# Patient Record
Sex: Female | Born: 1945 | Race: Black or African American | Hispanic: No | Smoking: Never smoker
Health system: Southern US, Community
[De-identification: ages and names within clinical notes are randomized; demographics above are authoritative.]

## PROBLEM LIST (undated history)

## (undated) DIAGNOSIS — I10 Essential (primary) hypertension: Secondary | ICD-10-CM

## (undated) DIAGNOSIS — M419 Scoliosis, unspecified: Secondary | ICD-10-CM

## (undated) DIAGNOSIS — K449 Diaphragmatic hernia without obstruction or gangrene: Secondary | ICD-10-CM

## (undated) DIAGNOSIS — IMO0001 Reserved for inherently not codable concepts without codable children: Secondary | ICD-10-CM

## (undated) DIAGNOSIS — F32A Depression, unspecified: Secondary | ICD-10-CM

## (undated) DIAGNOSIS — M1990 Unspecified osteoarthritis, unspecified site: Secondary | ICD-10-CM

## (undated) DIAGNOSIS — D649 Anemia, unspecified: Secondary | ICD-10-CM

## (undated) DIAGNOSIS — F329 Major depressive disorder, single episode, unspecified: Secondary | ICD-10-CM

## (undated) DIAGNOSIS — E7800 Pure hypercholesterolemia, unspecified: Secondary | ICD-10-CM

## (undated) DIAGNOSIS — E119 Type 2 diabetes mellitus without complications: Secondary | ICD-10-CM

## (undated) DIAGNOSIS — J449 Chronic obstructive pulmonary disease, unspecified: Secondary | ICD-10-CM

## (undated) DIAGNOSIS — Z91041 Radiographic dye allergy status: Secondary | ICD-10-CM

## (undated) DIAGNOSIS — K219 Gastro-esophageal reflux disease without esophagitis: Secondary | ICD-10-CM

## (undated) DIAGNOSIS — Z8719 Personal history of other diseases of the digestive system: Secondary | ICD-10-CM

## (undated) HISTORY — DX: Diaphragmatic hernia without obstruction or gangrene: K44.9

## (undated) HISTORY — PX: HERNIA REPAIR: SHX51

## (undated) HISTORY — PX: ABDOMINAL HYSTERECTOMY: SHX81

## (undated) HISTORY — PX: CHOLECYSTECTOMY: SHX55

## (undated) HISTORY — DX: Radiographic dye allergy status: Z91.041

## (undated) HISTORY — DX: Scoliosis, unspecified: M41.9

## (undated) HISTORY — DX: Chronic obstructive pulmonary disease, unspecified: J44.9

## (undated) HISTORY — DX: Morbid (severe) obesity due to excess calories: E66.01

## (undated) HISTORY — DX: Anemia, unspecified: D64.9

## (undated) HISTORY — PX: GASTRIC BYPASS: SHX52

## (undated) HISTORY — PX: CATARACT EXTRACTION W/ INTRAOCULAR LENS IMPLANT: SHX1309

---

## 2004-11-27 ENCOUNTER — Ambulatory Visit: Payer: Self-pay | Admitting: Pain Medicine

## 2004-11-30 ENCOUNTER — Ambulatory Visit: Payer: Self-pay | Admitting: Pain Medicine

## 2004-12-12 ENCOUNTER — Ambulatory Visit: Payer: Self-pay | Admitting: Pain Medicine

## 2004-12-14 ENCOUNTER — Ambulatory Visit: Payer: Self-pay | Admitting: Physician Assistant

## 2004-12-28 ENCOUNTER — Ambulatory Visit: Payer: Self-pay | Admitting: Family Medicine

## 2005-01-04 ENCOUNTER — Ambulatory Visit: Payer: Self-pay | Admitting: Pain Medicine

## 2005-01-18 ENCOUNTER — Ambulatory Visit: Payer: Self-pay | Admitting: Pain Medicine

## 2005-02-01 ENCOUNTER — Ambulatory Visit: Payer: Self-pay | Admitting: Physician Assistant

## 2005-02-26 ENCOUNTER — Ambulatory Visit: Payer: Self-pay | Admitting: Physician Assistant

## 2005-03-12 ENCOUNTER — Ambulatory Visit: Payer: Self-pay | Admitting: Pain Medicine

## 2005-03-13 ENCOUNTER — Ambulatory Visit: Payer: Self-pay | Admitting: Pain Medicine

## 2005-03-26 ENCOUNTER — Ambulatory Visit: Payer: Self-pay | Admitting: Physician Assistant

## 2005-04-05 ENCOUNTER — Ambulatory Visit: Payer: Self-pay | Admitting: Pain Medicine

## 2005-05-16 ENCOUNTER — Ambulatory Visit: Payer: Self-pay | Admitting: Physician Assistant

## 2005-07-13 ENCOUNTER — Ambulatory Visit: Payer: Self-pay | Admitting: Physician Assistant

## 2005-07-19 ENCOUNTER — Emergency Department: Payer: Self-pay | Admitting: Emergency Medicine

## 2005-08-21 ENCOUNTER — Ambulatory Visit: Payer: Self-pay | Admitting: Internal Medicine

## 2005-09-20 ENCOUNTER — Ambulatory Visit: Payer: Self-pay | Admitting: Pain Medicine

## 2005-10-15 ENCOUNTER — Ambulatory Visit: Payer: Self-pay | Admitting: Physician Assistant

## 2006-02-04 ENCOUNTER — Ambulatory Visit: Payer: Self-pay | Admitting: Physician Assistant

## 2006-02-15 ENCOUNTER — Ambulatory Visit: Payer: Self-pay | Admitting: Pain Medicine

## 2006-03-19 ENCOUNTER — Ambulatory Visit: Payer: Self-pay | Admitting: Pain Medicine

## 2006-03-29 ENCOUNTER — Ambulatory Visit: Payer: Self-pay | Admitting: Physician Assistant

## 2006-04-09 ENCOUNTER — Ambulatory Visit: Payer: Self-pay | Admitting: Pain Medicine

## 2006-05-08 ENCOUNTER — Ambulatory Visit: Payer: Self-pay | Admitting: Physician Assistant

## 2006-06-03 ENCOUNTER — Ambulatory Visit: Payer: Self-pay | Admitting: Physician Assistant

## 2006-06-11 ENCOUNTER — Ambulatory Visit: Payer: Self-pay | Admitting: Pain Medicine

## 2006-07-02 ENCOUNTER — Ambulatory Visit: Payer: Self-pay | Admitting: Physician Assistant

## 2006-08-29 ENCOUNTER — Ambulatory Visit: Payer: Self-pay | Admitting: Physician Assistant

## 2006-09-05 ENCOUNTER — Ambulatory Visit: Payer: Self-pay | Admitting: Pain Medicine

## 2006-09-20 ENCOUNTER — Ambulatory Visit: Payer: Self-pay | Admitting: Physician Assistant

## 2006-12-30 ENCOUNTER — Ambulatory Visit: Payer: Self-pay | Admitting: Pain Medicine

## 2007-01-20 ENCOUNTER — Ambulatory Visit: Payer: Self-pay | Admitting: Physician Assistant

## 2007-01-23 ENCOUNTER — Ambulatory Visit: Payer: Self-pay | Admitting: Pain Medicine

## 2007-02-04 ENCOUNTER — Ambulatory Visit: Payer: Self-pay | Admitting: Pain Medicine

## 2007-02-10 ENCOUNTER — Ambulatory Visit: Payer: Self-pay | Admitting: Physician Assistant

## 2007-02-18 ENCOUNTER — Ambulatory Visit: Payer: Self-pay | Admitting: Pain Medicine

## 2007-03-28 ENCOUNTER — Ambulatory Visit: Payer: Self-pay | Admitting: Physician Assistant

## 2007-04-03 ENCOUNTER — Ambulatory Visit: Payer: Self-pay | Admitting: Pain Medicine

## 2007-04-10 ENCOUNTER — Ambulatory Visit: Payer: Self-pay | Admitting: Neurological Surgery

## 2007-04-17 ENCOUNTER — Ambulatory Visit: Payer: Self-pay | Admitting: Pain Medicine

## 2007-05-14 ENCOUNTER — Ambulatory Visit: Payer: Self-pay | Admitting: Pain Medicine

## 2007-07-16 ENCOUNTER — Ambulatory Visit: Payer: Self-pay | Admitting: Physician Assistant

## 2007-07-29 ENCOUNTER — Ambulatory Visit: Payer: Self-pay | Admitting: Pain Medicine

## 2007-09-18 ENCOUNTER — Ambulatory Visit: Payer: Self-pay | Admitting: Physician Assistant

## 2007-10-02 ENCOUNTER — Ambulatory Visit: Payer: Self-pay | Admitting: Pain Medicine

## 2007-10-23 ENCOUNTER — Ambulatory Visit: Payer: Self-pay

## 2007-11-11 ENCOUNTER — Ambulatory Visit: Payer: Self-pay | Admitting: General Surgery

## 2007-12-15 ENCOUNTER — Ambulatory Visit: Payer: Self-pay | Admitting: Physician Assistant

## 2007-12-23 ENCOUNTER — Ambulatory Visit: Payer: Self-pay | Admitting: Pain Medicine

## 2007-12-29 ENCOUNTER — Ambulatory Visit: Payer: Self-pay | Admitting: Pain Medicine

## 2008-01-06 ENCOUNTER — Ambulatory Visit: Payer: Self-pay | Admitting: Physician Assistant

## 2008-04-06 ENCOUNTER — Ambulatory Visit: Payer: Self-pay | Admitting: Physician Assistant

## 2008-05-10 ENCOUNTER — Ambulatory Visit: Payer: Self-pay | Admitting: Pain Medicine

## 2008-06-03 ENCOUNTER — Ambulatory Visit: Payer: Self-pay | Admitting: Physician Assistant

## 2008-06-08 ENCOUNTER — Ambulatory Visit: Payer: Self-pay | Admitting: Pain Medicine

## 2008-07-01 ENCOUNTER — Ambulatory Visit: Payer: Self-pay | Admitting: Physician Assistant

## 2008-08-19 ENCOUNTER — Ambulatory Visit: Payer: Self-pay | Admitting: Physician Assistant

## 2008-11-09 ENCOUNTER — Ambulatory Visit: Payer: Self-pay | Admitting: Physician Assistant

## 2009-01-03 ENCOUNTER — Encounter: Admission: RE | Admit: 2009-01-03 | Discharge: 2009-04-03 | Payer: Self-pay | Admitting: Family Medicine

## 2009-02-02 ENCOUNTER — Ambulatory Visit: Payer: Self-pay | Admitting: Physician Assistant

## 2009-05-13 ENCOUNTER — Ambulatory Visit: Payer: Self-pay | Admitting: Pain Medicine

## 2009-08-10 ENCOUNTER — Ambulatory Visit: Payer: Self-pay | Admitting: Physician Assistant

## 2009-11-23 ENCOUNTER — Ambulatory Visit: Payer: Self-pay | Admitting: Physician Assistant

## 2009-11-24 ENCOUNTER — Inpatient Hospital Stay (HOSPITAL_COMMUNITY): Admission: EM | Admit: 2009-11-24 | Discharge: 2009-11-25 | Payer: Self-pay | Admitting: Emergency Medicine

## 2010-02-20 ENCOUNTER — Ambulatory Visit: Payer: Self-pay | Admitting: Pain Medicine

## 2010-03-27 ENCOUNTER — Ambulatory Visit: Payer: Self-pay | Admitting: Family Medicine

## 2010-12-10 ENCOUNTER — Encounter: Payer: Self-pay | Admitting: Neurological Surgery

## 2011-02-04 LAB — CARDIAC PANEL(CRET KIN+CKTOT+MB+TROPI): CK, MB: 12.8 ng/mL (ref 0.3–4.0)

## 2011-02-04 LAB — COMPREHENSIVE METABOLIC PANEL
Alkaline Phosphatase: 72 U/L (ref 39–117)
BUN: 18 mg/dL (ref 6–23)
CO2: 25 mEq/L (ref 19–32)
Chloride: 105 mEq/L (ref 96–112)
Creatinine, Ser: 0.68 mg/dL (ref 0.4–1.2)
GFR calc non Af Amer: >60 mL/min (ref >60)
Glucose, Bld: 147 mg/dL — ABNORMAL HIGH (ref 70–99)
Total Bilirubin: 1.1 mg/dL (ref 0.3–1.2)

## 2011-02-04 LAB — APTT: aPTT: 20 seconds — ABNORMAL LOW (ref 24–37)

## 2011-02-04 LAB — URINE MICROSCOPIC-ADD ON

## 2011-02-04 LAB — URINALYSIS, ROUTINE W REFLEX MICROSCOPIC
Hgb urine dipstick: NEGATIVE
Nitrite: NEGATIVE
Specific Gravity, Urine: 1.021 (ref 1.005–1.030)
Urobilinogen, UA: 0.2 mg/dL (ref 0.0–1.0)
pH: 5 (ref 5.0–8.0)

## 2011-02-04 LAB — CBC
HCT: 39 % (ref 36.0–46.0)
Hemoglobin: 12.9 g/dL (ref 12.0–15.0)
MCV: 91.8 fL (ref 78.0–100.0)
Platelets: 258 10*3/uL (ref 150–400)
RBC: 4.25 MIL/uL (ref 3.87–5.11)
WBC: 11.2 10*3/uL — ABNORMAL HIGH (ref 4.0–10.5)

## 2011-02-04 LAB — POCT I-STAT, CHEM 8
BUN: 26 mg/dL — ABNORMAL HIGH (ref 6–23)
Calcium, Ion: 1.09 mmol/L — ABNORMAL LOW (ref 1.12–1.32)
Chloride: 107 mEq/L (ref 96–112)
Glucose, Bld: 145 mg/dL — ABNORMAL HIGH (ref 70–99)
Potassium: 4.7 mEq/L (ref 3.5–5.1)

## 2011-02-04 LAB — PROTIME-INR
INR: 0.94 (ref 0.00–1.49)
Prothrombin Time: 12.5 seconds (ref 11.6–15.2)

## 2011-02-04 LAB — LACTIC ACID, PLASMA: Lactic Acid, Venous: 4 mmol/L — ABNORMAL HIGH (ref 0.5–2.2)

## 2012-12-16 ENCOUNTER — Inpatient Hospital Stay: Payer: Self-pay | Admitting: Student

## 2012-12-16 LAB — COMPREHENSIVE METABOLIC PANEL
Albumin: 2.9 g/dL — ABNORMAL LOW (ref 3.4–5.0)
Calcium, Total: 9.3 mg/dL (ref 8.5–10.1)
EGFR (African American): >60
EGFR (Non-African Amer.): 58 — ABNORMAL LOW
Potassium: 2.2 mmol/L — CL (ref 3.5–5.1)
SGOT(AST): 12 U/L — ABNORMAL LOW (ref 15–37)
Sodium: 137 mmol/L (ref 136–145)
Total Protein: 8.6 g/dL — ABNORMAL HIGH (ref 6.4–8.2)

## 2012-12-16 LAB — CBC
HCT: 35 % (ref 35.0–47.0)
HGB: 11 g/dL — ABNORMAL LOW (ref 12.0–16.0)
MCH: 27 pg (ref 26.0–34.0)
MCV: 86 fL (ref 80–100)
RBC: 4.06 10*6/uL (ref 3.80–5.20)

## 2012-12-17 LAB — POTASSIUM: Potassium: 4 mmol/L (ref 3.5–5.1)

## 2012-12-17 LAB — BASIC METABOLIC PANEL
Anion Gap: 12 (ref 7–16)
Calcium, Total: 8.6 mg/dL (ref 8.5–10.1)
Chloride: 101 mmol/L (ref 98–107)
EGFR (African American): >60
EGFR (Non-African Amer.): >60
Glucose: 152 mg/dL — ABNORMAL HIGH (ref 65–99)
Osmolality: 283 (ref 275–301)
Potassium: 2.4 mmol/L — CL (ref 3.5–5.1)

## 2012-12-17 LAB — CBC WITH DIFFERENTIAL/PLATELET
Basophil #: 0 10*3/uL (ref 0.0–0.1)
Basophil %: 0.2 %
HCT: 32.2 % — ABNORMAL LOW (ref 35.0–47.0)
HGB: 10.5 g/dL — ABNORMAL LOW (ref 12.0–16.0)
Lymphocyte #: 2.1 10*3/uL (ref 1.0–3.6)
MCH: 28.3 pg (ref 26.0–34.0)
MCHC: 32.8 g/dL (ref 32.0–36.0)
MCV: 86 fL (ref 80–100)
Monocyte %: 10.2 %
Neutrophil #: 10.7 10*3/uL — ABNORMAL HIGH (ref 1.4–6.5)
Neutrophil %: 74.7 %
Platelet: 326 10*3/uL (ref 150–440)
RBC: 3.73 10*6/uL — ABNORMAL LOW (ref 3.80–5.20)
RDW: 14.4 % (ref 11.5–14.5)
WBC: 14.4 10*3/uL — ABNORMAL HIGH (ref 3.6–11.0)

## 2012-12-18 LAB — CLOSTRIDIUM DIFFICILE BY PCR

## 2012-12-19 LAB — CBC WITH DIFFERENTIAL/PLATELET
Basophil #: 0 10*3/uL (ref 0.0–0.1)
Eosinophil #: 0.2 10*3/uL (ref 0.0–0.7)
HCT: 32.3 % — ABNORMAL LOW (ref 35.0–47.0)
HGB: 10.1 g/dL — ABNORMAL LOW (ref 12.0–16.0)
Lymphocyte %: 21.7 %
MCHC: 31.4 g/dL — ABNORMAL LOW (ref 32.0–36.0)
MCV: 87 fL (ref 80–100)
Monocyte #: 1.1 x10 3/mm — ABNORMAL HIGH (ref 0.2–0.9)
Monocyte %: 7.8 %
Neutrophil #: 9.3 10*3/uL — ABNORMAL HIGH (ref 1.4–6.5)
Neutrophil %: 68.4 %
RDW: 14.5 % (ref 11.5–14.5)
WBC: 13.6 10*3/uL — ABNORMAL HIGH (ref 3.6–11.0)

## 2012-12-22 ENCOUNTER — Ambulatory Visit: Payer: Self-pay | Admitting: Pain Medicine

## 2013-01-06 ENCOUNTER — Ambulatory Visit: Payer: Self-pay | Admitting: Pain Medicine

## 2013-01-06 LAB — COMPREHENSIVE METABOLIC PANEL
Albumin: 3.8 g/dL (ref 3.4–5.0)
BUN: 33 mg/dL — ABNORMAL HIGH (ref 7–18)
Bilirubin,Total: 0.4 mg/dL (ref 0.2–1.0)
Co2: 28 mmol/L (ref 21–32)
EGFR (African American): >60
Potassium: 3.6 mmol/L (ref 3.5–5.1)
SGOT(AST): 34 U/L (ref 15–37)
SGPT (ALT): 38 U/L (ref 12–78)
Sodium: 138 mmol/L (ref 136–145)
Total Protein: 8.9 g/dL — ABNORMAL HIGH (ref 6.4–8.2)

## 2013-01-06 LAB — SEDIMENTATION RATE: Erythrocyte Sed Rate: 37 mm/hr — ABNORMAL HIGH (ref 0–30)

## 2013-02-12 ENCOUNTER — Ambulatory Visit: Payer: Self-pay | Admitting: Pain Medicine

## 2013-03-02 ENCOUNTER — Ambulatory Visit: Payer: Self-pay | Admitting: Pain Medicine

## 2013-04-17 ENCOUNTER — Ambulatory Visit: Payer: Self-pay | Admitting: Pain Medicine

## 2013-04-23 ENCOUNTER — Ambulatory Visit: Payer: Self-pay | Admitting: Pain Medicine

## 2013-05-13 ENCOUNTER — Ambulatory Visit: Payer: Self-pay | Admitting: Pain Medicine

## 2013-07-31 ENCOUNTER — Ambulatory Visit: Payer: Self-pay | Admitting: Pain Medicine

## 2013-08-13 ENCOUNTER — Ambulatory Visit: Payer: Self-pay | Admitting: Pain Medicine

## 2013-09-02 ENCOUNTER — Ambulatory Visit: Payer: Self-pay | Admitting: Pain Medicine

## 2013-09-08 ENCOUNTER — Ambulatory Visit: Payer: Self-pay | Admitting: Ophthalmology

## 2013-12-11 ENCOUNTER — Ambulatory Visit: Payer: Self-pay | Admitting: Pain Medicine

## 2014-04-30 ENCOUNTER — Ambulatory Visit: Payer: Self-pay | Admitting: Pain Medicine

## 2014-08-20 ENCOUNTER — Ambulatory Visit: Payer: Self-pay | Admitting: Pain Medicine

## 2015-03-11 NOTE — Consult Note (Signed)
PATIENT NAME:  Mariah Poole, Mariah Poole MR#:  [id number] DATE OF BIRTH:  1946/01/07  DATE OF CONSULTATION:  12/18/2012  CONSULTING PHYSICIAN:  Scot Jun, MD  HISTORY OF PRESENT ILLNESS:   The patient is a 69 year old black female. She had the onset of nausea, vomiting, headache, myalgias and significant diarrhea to the point of dehydration and hypokalemia. He was admitted to the hospital. I was asked to see her for diarrhea.    The patient feels that she is definitely getting better. At the beginning of her illness, she was unable to keep down fluids, was having copious diarrhea multiple times a night.  She denies any hematemesis. No hematochezia. She was started on Tamiflu after being seen at [facility]. The diarrhea was 5 to 10 loose stools a day, several nocturnal movements. Because of the vomiting, she was not able to drink much, so she was admitted to the hospital. I was asked to see her in consultation.   PAST MEDICAL HISTORY:  Includes diabetes, hypertension, hyperlipidemia and GERD.   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 She lost 100 pounds with the surgery.   CURRENT MEDICATIONS: Tylenol with hydrocodone, albuterol inhaler, atenolol 25 mg a day, Belviq 10 mg b.i.d., Colace 100 mg b.i.d., Lasix 20 mg a day, Glumetza 1000 mg extended release daily, which is metformin; hydrochlorothiazide 25 mg a day, potassium 20 mEq a day, Lomotil as needed, Pravachol 20 mg at bedtime, promethazine 12.5 to 25 mg every 6 hours p.r.n., Protonix 40 mg a day and Tamiflu 75 mg b.i.d.   PHYSICAL EXAMINATION: GENERAL:  Black female in no acute distress.  HEENT:  Sclerae anicteric. Conjunctivae negative. Tongue negative. Head is atraumatic.  CHEST:  Clear.  HEART:  No murmurs or gallops I can hear.  ABDOMEN:  No hepatosplenomegaly. No masses. No bruits. No significant tenderness.  SKIN:  Warm and dry.  PSYCHIATRIC:  Mood and affect are appropriate.   LABORATORY AND  RADIOLOGIC DATA: Glucose 130, BUN 27, creatinine 1, sodium 137, potassium 2.2, chloride 98, bicarb 30, albumin 2.9. Liver functions normal. White count 13.8, hemoglobin 11. The patient had a chest x-ray that showed possible hypoinflation versus atelectasis, fibrosis or minimal infiltrations.   ASSESSMENT AND PLAN: The patient likely has a flulike syndrome with vomiting and diarrhea. She has hypokalemia that has been corrected. Her problems may be exacerbated by the fact that she is diabetic. No recommendations for anything else at this time except fluids and symptomatic treatment. I will follow with you.    ____________________________ Scot Jun, MD rte:dm D: 12/18/2012 09:35:54 ET T: 12/18/2012 09:55:02 ET JOB#: [id number]  cc: Scot Jun, MD, <Dictator> Srikar R. Elpidio Anis, MD Scot Jun MD ELECTRONICALLY SIGNED 01/08/2013 7:55

## 2015-03-11 NOTE — H&P (Signed)
PATIENT NAME:  Mariah Poole, Mariah Poole MR#:  [id number] DATE OF BIRTH:  1946-08-08  DATE OF ADMISSION:  12/16/2012  PRIMARY CARE PHYSICIAN:  [facility].   CHIEF COMPLAINT:  Nausea, vomiting, diarrhea.   HISTORY OF PRESENT ILLNESS:  This is a 69 year old female who presents to the hospital due to persistent nausea, vomiting and diarrhea ongoing for the past few days. The patient says that she started feeling ill about Tuesday of last week when she started developing some chills and some respiratory symptoms. She went to see her primary care physician at [facility] and was diagnosed with the flu and started on Tamiflu shortly thereafter. The patient says she has been having diarrhea which is at least 5 to 10 loose bowel movements for the past week or so. It is not associated with any food. She has had no sick contacts. Her diarrhea is nonbloody in nature. She also has had poor p.o. intake and had some chills. No documented fever. The patient says she has not been able to eat or drink anything for the past 2 days as she is scared about vomiting and having diarrhea. She went to see her primary care physician at the [facility] today who then referred her to the ER for further evaluation.   REVIEW OF SYSTEMS: CONSTITUTIONAL: No documented fever. No weight gain or weight loss.  EYES: No blurred or double vision.  ENT: No tinnitus. No postnasal drip. No redness of the oropharynx.  RESPIRATORY: Positive cough. No wheeze, no hemoptysis. Positive dyspnea.  CARDIOVASCULAR: No chest pain, no orthopnea, no palpitations, no syncope.  GASTROINTESTINAL: Positive nausea. Positive diarrhea. Positive vomiting. No abdominal pain, no melena, no hematochezia.  GENITOURINARY: No dysuria or hematuria.  ENDOCRINE: No polyuria or nocturia. No heat or cold intolerance.  HEMATOLOGIC: No acute anemia, bruising, bleeding.  INTEGUMENTARY: No rashes. No lesions.  MUSCULOSKELETAL: No arthritis, no swelling, no gout.  NEUROLOGIC: No  numbness or tingling. No ataxia, no seizure-type activity.  PSYCHIATRIC: No anxiety, no insomnia, no ADD.   PAST MEDICAL HISTORY:  Consistent with diabetes, hypertension, hyperlipidemia, GERD.   ALLERGIES:  IV DYE.   SOCIAL HISTORY:  No smoking. No alcohol abuse. No illicit drug abuse. Lives at home with her daughter.   FAMILY HISTORY:  Mother died from old age from Alzheimer's dementia. Father died from complications of heart disease. He was also a diabetic. Diabetes seems to run in her family.   CURRENT MEDICATIONS:  Tylenol with hydrocodone 10/325 mg 1 tab t.i.d. as needed, albuterol inhaler 2 puffs q.i.d. as needed, atenolol 25 mg daily, Belviq 10 mg b.i.d., Colace 10 mg b.i.d., Lasix 20 mg daily, Glumetza 1000 mg extended release daily which is metformin, hydrochlorothiazide 25 mg daily, potassium 20 mEq daily, Lomotil as needed, Pravachol 20 mg at bedtime, promethazine 25 mg 1/2 tab to 1 tab q. 6 hours as needed, Protonix 40 mg daily and Tamiflu 75 mg b.i.d. (she only has 2 pills left).   PHYSICAL EXAMINATION: VITAL SIGNS: Temperature 99.8, pulse 98, respirations 20, blood pressure 133/68, sats 95% on room air.  GENERAL: She is a pleasant appearing female in no apparent distress.  HEENT: Atraumatic, normocephalic. Extraocular muscles are intact. Pupils equal and reactive to light. Sclerae anicteric. No conjunctival injection. No pharyngeal erythema.  NECK: Supple. There is no jugular venous distention, no bruits, no lymphadenopathy, no thyromegaly.  HEART: Tachycardic, regular. No murmurs, rubs or clicks.  LUNGS: Clear to auscultation bilaterally. No rales, no rhonchi, no wheezes.  ABDOMEN: Soft, flat,  nontender, nondistended. Has good bowel sounds. No hepatosplenomegaly appreciated. She does have a positive ventral hernia.  SKIN: Moist and warm with no rashes appreciated.  EXTREMITIES: No evidence of any cyanosis, clubbing or peripheral edema. Has +2 pedal and radial pulses bilaterally.   NEUROLOGICAL: The patient is alert, awake and oriented x 3 with no focal motor or sensory deficits appreciated bilaterally.  LYMPHATICS: There is no cervical or axillary lymphadenopathy.   DIAGNOSTIC DATA:  Serum glucose 130, BUN 27, creatinine 1, sodium 137, potassium 2.2, chloride 98, bicarbonate 30 and albumin is 2.9. LFTs are within normal limits. Troponin is less than 0.02. White cell count 13.8, hemoglobin 11, hematocrit 35, platelet count of _____.   The patient did have a chest x-ray done which showed hypoinflation of bilateral lung bases, areas of presumed atelectasis, fibrosis and minimal infiltrate.   ASSESSMENT AND PLAN:  This is a 69 year old female with diabetes, hypertension, hyperlipidemia and gastroesophageal reflux disease who presents to the hospital with nausea, vomiting and diarrhea ongoing for the past week. She was also recently diagnosed with the flu. She is noted to be hypokalemic and noted to have a suspected superimposed pneumonia.  1.  Pneumonia. The patient on chest x-ray does have a questionable infiltrate that is noted on the bases. This is suspected to be a superimposed pneumonia after she was recently diagnosed with the flu about a week or so ago. I will place the patient on intravenous Levaquin, follow her clinically and follow blood and sputum cultures.  2.  Hypokalemia. The patient was noted to have EKG changes as she had ST depressions on her EKG, but no U waves noted. This is likely a result of the nausea, vomiting and diarrhea. I will replace her potassium accordingly and follow it. I will go ahead and check her magnesium level. I will hold her hydrochlorothiazide and Lasix for now.  3.  Nausea, vomiting and diarrhea. The exact etiology of this is unclear. Questionable if this is related to the viral illness that she had last week or if it is a side effect of the fact that she was taking Tamiflu. For now, I will continue supportive care with intravenous fluids and  antiemetics. We will give her some p.r.n. Imodium for the diarrhea. I will check stool for comprehensive culture, ova and parasites and follow her clinically. We will place her on a clear liquid diet for now.  4.  Diabetes. Hold her metformin for now and place her on sliding scale insulin.  5.  Gastroesophageal reflux disease. Continue Protonix.  6.  Hypertension. Continue with her atenolol and hold Lasix and hydrochlorothiazide.  7.  Hyperlipidemia. Continue Pravachol.   CODE STATUS:  The patient is a full code.   TIME SPENT:  50 minutes.    ____________________________ Rolly Pancake. Cherlynn Kaiser, MD vjs:si D: 12/16/2012 17:05:13 ET T: 12/16/2012 17:25:35 ET JOB#: [id number]  cc: Rolly Pancake. Cherlynn Kaiser, MD, <Dictator> Houston Siren MD ELECTRONICALLY SIGNED 12/17/2012 15:34

## 2015-03-11 NOTE — Discharge Summary (Signed)
PATIENT NAME:  Mariah Poole, Mariah Poole MR#:  [id number] DATE OF BIRTH:  10/20/1946  DATE OF ADMISSION:  12/16/2012 DATE OF DISCHARGE:  12/19/2012  CONSULTANT: Scot Jun, MD, from GI.   CHIEF COMPLAINT: Nausea, vomiting and diarrhea.   DISCHARGE DIAGNOSES:  1. Nausea, vomiting, diarrhea, likely from viral gastroenteritis.  2. Severe hypokalemia.  3. Pneumonia.  4. Diabetes.  5. History of recent influenza.  6. Obesity.  7. Hypertension.  8. Hyperlipidemia.  9. GERD.   DISCHARGE MEDICATIONS:  1. Protonix 40 mg daily.  2. Klor-Con 20 mEq extended-release 1 tab once a day.  3. Atenolol 25 mg daily.  4. Albuterol CFC free 90 mcg inhaled 2 puffs every 4 hours as needed for shortness of breath. 5. Hydrochlorothiazide 25 mg daily.  6. Pravastatin 20 mg daily.  7. Furosemide 20 mg once a day as needed for swelling.  8. Acetaminophen/hydrocodone 325/10 mg 1 tab 3 times a day as needed for pain.  9. Promethazine 25 mg take 1/2-tab to 1 tab every 6 hours as needed for nausea or vomiting.  10. Belviq 10 mg oral tablet 1 tab 2 times a day.  11. Glumetza 1000 mg extended-release 1 tab once a day.  12. Levaquin 750 mg 1 tab daily for 2 days.   DIET: Low sodium, low fat, low cholesterol, ADA diet.   ACTIVITY: As tolerated.   FOLLOWUP: Please follow with your PCP within 1 to 2 weeks. If recurrent fevers, worsening shortness of breath, diarrhea, abdominal pain or any other concerns, call your PCP right away.   DISPOSITION: Home.   CODE STATUS: The patient is full code.   HISTORY OF PRESENT ILLNESS: For full details of the H and P, please see the dictation done January 28th by Dr. Cherlynn Kaiser, but briefly, this is a pleasant 69 year old female with a history of recent influenza, who was started on Tamiflu. The patient has been having diarrhea, which is nonbloody, with poor p.o. intake, some chills and respiratory symptoms. The patient also had vomiting, and therefore was admitted to the hospitalist  service for further evaluation and management after PCP at [facility] referred her to the ED for further evaluation.   SIGNIFICANT LABS AND IMAGING: Initial BNP was 265, BUN 27, creatinine 1, potassium 2.2, last potassium of 4. Initial magnesium 1.4. LFT on arrival: AST 12, ALT 17, albumin 2.9, total protein 8.6. Troponin negative. Initial WBC 13.8, peak WBC 14.4, last WBC 13.6, hemoglobin 11 on arrival. Blood cultures: No growth to date from January 28th. Stool cultures are negative from January 29. Stool ova and parasites, Giardia are pending. Stool WBC does not show RBC or WBC. Stool for C. difficile is negative. Chest x-ray, PA and lateral, on arrival showing hypoinflation with bilateral lung base areas of presumed atelectasis or fibrosis or infiltrate.   HOSPITAL COURSE: The patient was admitted to the hospitalist service. The patient had significant electrolyte abnormalities likely from the nausea, vomiting and diarrhea. Electrolytes  were repleted. The patient likely had viral gastroenteritis from her recent influenza. The patient was seen by GI, Dr. Mechele Collin, who had no further recommendations. Stool cultures have been sent and are back and are pretty much negative. At this point, the nausea and vomiting have resolved, diarrhea is resulting, and the patient has been forming more bulky, less liquid stool. She has only had 1 bowel movement today and has tolerated her diet. The patient also likely had a pneumonia postinfluenza and was started on Levaquin. She has no significant shortness  of breath. She has persistent leukocytosis and should check her CBC as an outpatient. Blood cultures are negative. She is currently on room air and feels much better. In regards to the severe hypokalemia, it was repleted. The Lasix p.r.n. and hydrochlorothiazide were held, and by discharge day, will be continued. Otherwise, her outpatient medications were continued. At this point, the patient has tolerated advanced diet  and will be discharged.   ____________________________ Krystal Eaton, MD sa:OSi D: 12/19/2012 14:59:21 ET T: 12/20/2012 12:00:03 ET JOB#: [id number]  cc: Krystal Eaton, MD, <Dictator> Leanna Sato, MD Krystal Eaton MD ELECTRONICALLY SIGNED 12/25/2012 20:06

## 2015-03-11 NOTE — Consult Note (Signed)
Consult done, probable viral infection with vomiting and diarrhea, some myalgias, seems to be getting better.  No new GI recommendations.  Dictated note to follow later today.  i will follow with you.  Electronic Signatures: Scot Jun (MD)  (Signed on 30-Jan-14 08:55)  Authored  Last Updated: 30-Jan-14 08:55 by Scot Jun (MD)

## 2015-05-26 ENCOUNTER — Other Ambulatory Visit: Payer: Self-pay | Admitting: Nurse Practitioner

## 2015-05-26 DIAGNOSIS — Z1231 Encounter for screening mammogram for malignant neoplasm of breast: Secondary | ICD-10-CM

## 2015-07-14 DIAGNOSIS — E119 Type 2 diabetes mellitus without complications: Secondary | ICD-10-CM | POA: Diagnosis not present

## 2015-07-14 DIAGNOSIS — Z90710 Acquired absence of both cervix and uterus: Secondary | ICD-10-CM | POA: Diagnosis not present

## 2015-07-14 DIAGNOSIS — I10 Essential (primary) hypertension: Secondary | ICD-10-CM | POA: Diagnosis not present

## 2015-07-14 DIAGNOSIS — E780 Pure hypercholesterolemia: Secondary | ICD-10-CM | POA: Diagnosis not present

## 2015-07-14 DIAGNOSIS — F329 Major depressive disorder, single episode, unspecified: Secondary | ICD-10-CM | POA: Diagnosis not present

## 2015-07-14 DIAGNOSIS — K219 Gastro-esophageal reflux disease without esophagitis: Secondary | ICD-10-CM | POA: Diagnosis not present

## 2015-07-14 DIAGNOSIS — H2512 Age-related nuclear cataract, left eye: Secondary | ICD-10-CM | POA: Diagnosis not present

## 2015-07-14 DIAGNOSIS — M1990 Unspecified osteoarthritis, unspecified site: Secondary | ICD-10-CM | POA: Diagnosis not present

## 2015-07-14 DIAGNOSIS — Z9884 Bariatric surgery status: Secondary | ICD-10-CM | POA: Diagnosis not present

## 2015-07-14 DIAGNOSIS — K449 Diaphragmatic hernia without obstruction or gangrene: Secondary | ICD-10-CM | POA: Diagnosis not present

## 2015-07-14 DIAGNOSIS — Z91041 Radiographic dye allergy status: Secondary | ICD-10-CM | POA: Diagnosis not present

## 2015-07-19 ENCOUNTER — Ambulatory Visit
Admission: RE | Admit: 2015-07-19 | Discharge: 2015-07-19 | Disposition: A | Discharge status: Home / Self Care | Payer: Medicare Other | Source: Ambulatory Visit | Attending: Ophthalmology | Admitting: Ophthalmology

## 2015-07-19 ENCOUNTER — Ambulatory Visit: Payer: Medicare Other | Admitting: Anesthesiology

## 2015-07-19 ENCOUNTER — Encounter: Payer: Self-pay | Admitting: *Deleted

## 2015-07-19 ENCOUNTER — Encounter
Admission: RE | Disposition: A | Discharge status: Home / Self Care | Payer: Self-pay | Source: Ambulatory Visit | Attending: Ophthalmology

## 2015-07-19 DIAGNOSIS — H2512 Age-related nuclear cataract, left eye: Secondary | ICD-10-CM | POA: Diagnosis not present

## 2015-07-19 DIAGNOSIS — E780 Pure hypercholesterolemia: Secondary | ICD-10-CM | POA: Insufficient documentation

## 2015-07-19 DIAGNOSIS — M1990 Unspecified osteoarthritis, unspecified site: Secondary | ICD-10-CM | POA: Insufficient documentation

## 2015-07-19 DIAGNOSIS — E119 Type 2 diabetes mellitus without complications: Secondary | ICD-10-CM | POA: Insufficient documentation

## 2015-07-19 DIAGNOSIS — Z91041 Radiographic dye allergy status: Secondary | ICD-10-CM | POA: Insufficient documentation

## 2015-07-19 DIAGNOSIS — Z9884 Bariatric surgery status: Secondary | ICD-10-CM | POA: Insufficient documentation

## 2015-07-19 DIAGNOSIS — F329 Major depressive disorder, single episode, unspecified: Secondary | ICD-10-CM | POA: Insufficient documentation

## 2015-07-19 DIAGNOSIS — Z90710 Acquired absence of both cervix and uterus: Secondary | ICD-10-CM | POA: Insufficient documentation

## 2015-07-19 DIAGNOSIS — K219 Gastro-esophageal reflux disease without esophagitis: Secondary | ICD-10-CM | POA: Insufficient documentation

## 2015-07-19 DIAGNOSIS — K449 Diaphragmatic hernia without obstruction or gangrene: Secondary | ICD-10-CM | POA: Insufficient documentation

## 2015-07-19 DIAGNOSIS — I10 Essential (primary) hypertension: Secondary | ICD-10-CM | POA: Insufficient documentation

## 2015-07-19 HISTORY — DX: Major depressive disorder, single episode, unspecified: F32.9

## 2015-07-19 HISTORY — DX: Type 2 diabetes mellitus without complications: E11.9

## 2015-07-19 HISTORY — PX: CATARACT EXTRACTION W/PHACO: SHX586

## 2015-07-19 HISTORY — DX: Personal history of other diseases of the digestive system: Z87.19

## 2015-07-19 HISTORY — DX: Pure hypercholesterolemia, unspecified: E78.00

## 2015-07-19 HISTORY — DX: Reserved for inherently not codable concepts without codable children: IMO0001

## 2015-07-19 HISTORY — DX: Unspecified osteoarthritis, unspecified site: M19.90

## 2015-07-19 HISTORY — DX: Depression, unspecified: F32.A

## 2015-07-19 HISTORY — DX: Gastro-esophageal reflux disease without esophagitis: K21.9

## 2015-07-19 HISTORY — DX: Essential (primary) hypertension: I10

## 2015-07-19 LAB — POTASSIUM: Potassium: 3.7 mmol/L (ref 3.5–5.1)

## 2015-07-19 LAB — GLUCOSE, CAPILLARY: GLUCOSE-CAPILLARY: 131 mg/dL — AB (ref 65–99)

## 2015-07-19 SURGERY — PHACOEMULSIFICATION, CATARACT, WITH IOL INSERTION
Anesthesia: Monitor Anesthesia Care | Site: Eye | Laterality: Left | Wound class: Clean

## 2015-07-19 MED ORDER — NA CHONDROIT SULF-NA HYALURON 40-17 MG/ML IO SOLN
INTRAOCULAR | Status: AC
  Filled 2015-07-19: qty 1

## 2015-07-19 MED ORDER — CEFUROXIME OPHTHALMIC INJECTION 1 MG/0.1 ML
INJECTION | OPHTHALMIC | Status: AC
  Filled 2015-07-19: qty 0.1

## 2015-07-19 MED ORDER — EPINEPHRINE HCL 1 MG/ML IJ SOLN
INTRAOCULAR | Status: DC | PRN
  Administered 2015-07-19: 200 mL via OPHTHALMIC

## 2015-07-19 MED ORDER — MOXIFLOXACIN HCL 0.5 % OP SOLN
OPHTHALMIC | Status: AC
  Filled 2015-07-19: qty 3

## 2015-07-19 MED ORDER — TETRACAINE HCL 0.5 % OP SOLN
OPHTHALMIC | Status: AC
  Administered 2015-07-19: 1 [drp] via OPHTHALMIC
  Filled 2015-07-19: qty 2

## 2015-07-19 MED ORDER — MOXIFLOXACIN HCL 0.5 % OP SOLN
OPHTHALMIC | Status: DC | PRN
  Administered 2015-07-19: 1 [drp] via OPHTHALMIC

## 2015-07-19 MED ORDER — ARMC OPHTHALMIC DILATING GEL
OPHTHALMIC | Status: AC
  Administered 2015-07-19: 1 via OPHTHALMIC
  Filled 2015-07-19: qty 0.25

## 2015-07-19 MED ORDER — SODIUM CHLORIDE 0.9 % IV SOLN
INTRAVENOUS | Status: DC
  Administered 2015-07-19: 08:00:00 via INTRAVENOUS

## 2015-07-19 MED ORDER — FENTANYL CITRATE (PF) 100 MCG/2ML IJ SOLN
INTRAMUSCULAR | Status: DC | PRN
  Administered 2015-07-19: 50 ug via INTRAVENOUS

## 2015-07-19 MED ORDER — TETRACAINE HCL 0.5 % OP SOLN
1.0000 [drp] | OPHTHALMIC | Status: AC | PRN
  Administered 2015-07-19: 1 [drp] via OPHTHALMIC

## 2015-07-19 MED ORDER — POVIDONE-IODINE 5 % OP SOLN
1.0000 "application " | OPHTHALMIC | Status: AC | PRN
  Administered 2015-07-19: 1 via OPHTHALMIC

## 2015-07-19 MED ORDER — ARMC OPHTHALMIC DILATING GEL
1.0000 "application " | OPHTHALMIC | Status: AC | PRN
  Administered 2015-07-19: 1 via OPHTHALMIC

## 2015-07-19 MED ORDER — CEFUROXIME OPHTHALMIC INJECTION 1 MG/0.1 ML
INJECTION | OPHTHALMIC | Status: DC | PRN
  Administered 2015-07-19: 0.1 mL via INTRACAMERAL

## 2015-07-19 MED ORDER — EPINEPHRINE HCL 1 MG/ML IJ SOLN
INTRAMUSCULAR | Status: AC
  Filled 2015-07-19: qty 1

## 2015-07-19 MED ORDER — MIDAZOLAM HCL 2 MG/2ML IJ SOLN
INTRAMUSCULAR | Status: DC | PRN
  Administered 2015-07-19: 2 mg via INTRAVENOUS

## 2015-07-19 MED ORDER — POVIDONE-IODINE 5 % OP SOLN
OPHTHALMIC | Status: AC
  Administered 2015-07-19: 1 via OPHTHALMIC
  Filled 2015-07-19: qty 30

## 2015-07-19 MED ORDER — NA CHONDROIT SULF-NA HYALURON 40-17 MG/ML IO SOLN
INTRAOCULAR | Status: DC | PRN
  Administered 2015-07-19: 1 mL via INTRAOCULAR

## 2015-07-19 MED ORDER — MOXIFLOXACIN HCL 0.5 % OP SOLN: 1.0000 [drp] | OPHTHALMIC | Status: DC | PRN

## 2015-07-19 SURGICAL SUPPLY — 22 items
CANNULA ANT/CHMB 27GA (MISCELLANEOUS) ×3 IMPLANT
CUP MEDICINE 2OZ PLAST GRAD ST (MISCELLANEOUS) ×3 IMPLANT
GLOVE BIO SURGEON STRL SZ8 (GLOVE) ×3 IMPLANT
GLOVE BIOGEL M 6.5 STRL (GLOVE) ×3 IMPLANT
GLOVE SURG LX 8.0 MICRO (GLOVE) ×2
GLOVE SURG LX STRL 8.0 MICRO (GLOVE) ×1 IMPLANT
GOWN STRL REUS W/ TWL LRG LVL3 (GOWN DISPOSABLE) ×2 IMPLANT
GOWN STRL REUS W/TWL LRG LVL3 (GOWN DISPOSABLE) ×4
LENS IOL TECNIS 19.5 (Intraocular Lens) ×3 IMPLANT
LENS IOL TECNIS MONO 1P 19.5 (Intraocular Lens) ×1 IMPLANT
PACK CATARACT (MISCELLANEOUS) ×3 IMPLANT
PACK CATARACT BRASINGTON LX (MISCELLANEOUS) ×3 IMPLANT
PACK EYE AFTER SURG (MISCELLANEOUS) ×3 IMPLANT
SOL BSS BAG (MISCELLANEOUS) ×3
SOL PREP PVP 2OZ (MISCELLANEOUS) ×3
SOLUTION BSS BAG (MISCELLANEOUS) ×1 IMPLANT
SOLUTION PREP PVP 2OZ (MISCELLANEOUS) ×1 IMPLANT
SYR 3ML LL SCALE MARK (SYRINGE) ×3 IMPLANT
SYR 5ML LL (SYRINGE) ×3 IMPLANT
SYR TB 1ML 27GX1/2 LL (SYRINGE) ×3 IMPLANT
WATER STERILE IRR 1000ML POUR (IV SOLUTION) ×3 IMPLANT
WIPE NON LINTING 3.25X3.25 (MISCELLANEOUS) ×3 IMPLANT

## 2015-07-19 NOTE — H&P (Signed)
  All labs reviewed. Abnormal studies sent to patients PCP when indicated.  Previous H&P reviewed, patient examined, there are NO CHANGES.  Mariah Poole LOUIS8/30/20168:52 AM

## 2015-07-19 NOTE — Transfer of Care (Signed)
Immediate Anesthesia Transfer of Care Note  Patient: Janei Scheff Deshaies  Procedure(s) Performed: Procedure(s) with comments: CATARACT EXTRACTION PHACO AND INTRAOCULAR LENS PLACEMENT (IOC) (Left) - [place]: 00:39.5 AP%: 24.4 CDE: 9.66 Lot #[id number]  Patient Location: short stay  Anesthesia Type:MAC  Level of Consciousness: awake, alert  and oriented  Airway & Oxygen Therapy: Patient Spontanous Breathing and Patient connected to nasal cannula oxygen  Post-op Assessment: Report given to RN and Post -op Vital signs reviewed and stable  Post vital signs: Reviewed and stable  Last Vitals: 100%sat 64 hr 132/75 18resp Filed Vitals:   07/19/15 0745  BP: 120/72  Pulse: 77  Temp: 36.4 C  Resp: 18    Complications: No apparent anesthesia complications

## 2015-07-19 NOTE — Anesthesia Preprocedure Evaluation (Signed)
Anesthesia Evaluation  Patient identified by MRN, date of birth, ID band Patient awake    Reviewed: Allergy & Precautions, NPO status , Patient's Chart, lab work & pertinent test results  Airway Mallampati: II       Dental  (+) Upper Dentures, Lower Dentures   Pulmonary shortness of breath and with exertion,    Pulmonary exam normal       Cardiovascular hypertension, Normal cardiovascular exam    Neuro/Psych Depression negative neurological ROS     GI/Hepatic Neg liver ROS, hiatal hernia, GERD-  Medicated and Controlled,  Endo/Other  diabetes, Well Controlled, Type 2, Oral Hypoglycemic Agents  Renal/GU negative Renal ROS  negative genitourinary   Musculoskeletal  (+) Arthritis -, Osteoarthritis,    Abdominal Normal abdominal exam  (+)   Peds negative pediatric ROS (+)  Hematology negative hematology ROS (+)   Anesthesia Other Findings   Reproductive/Obstetrics                             Anesthesia Physical Anesthesia Plan  ASA: III  Anesthesia Plan: MAC   Post-op Pain Management:    Induction: Intravenous  Airway Management Planned: Nasal Cannula  Additional Equipment:   Intra-op Plan:   Post-operative Plan:   Informed Consent:   Dental advisory given  Plan Discussed with: CRNA and Surgeon  Anesthesia Plan Comments:         Anesthesia Quick Evaluation

## 2015-07-19 NOTE — Anesthesia Postprocedure Evaluation (Signed)
   Anesthesia Post-op Note  Patient: Mariah Poole  Procedure(s) Performed: Procedure(s) with comments: CATARACT EXTRACTION PHACO AND INTRAOCULAR LENS PLACEMENT (IOC) (Left) - [place]: 00:39.5 AP%: 24.4 CDE: 9.66 Lot #[id number]  Anesthesia type:MAC  Patient location: short stay  Post pain: Pain level controlled  Post assessment: Post-op Vital signs reviewed, Patient's Cardiovascular Status Stable, Respiratory Function Stable, Patent Airway and No signs of Nausea or vomiting  Post vital signs: Reviewed and stable  Last Vitals:  Filed Vitals:   07/19/15 0745  BP: 120/72  Pulse: 77  Temp: 36.4 C  Resp: 18    Level of consciousness: awake, alert  and patient cooperative  Complications: No apparent anesthesia complications

## 2015-07-19 NOTE — Discharge Instructions (Signed)

## 2015-07-19 NOTE — Op Note (Signed)
PREOPERATIVE DIAGNOSIS:  Nuclear sclerotic cataract of the left eye.   POSTOPERATIVE DIAGNOSIS:  left nuclear sclerotic cataract   OPERATIVE PROCEDURE:  Procedure(s): CATARACT EXTRACTION PHACO AND INTRAOCULAR LENS PLACEMENT (IOC)   SURGEON:  Galen Manila, MD.   ANESTHESIA:   Anesthesiologist: Yves Dill, MD CRNA: Chong Sicilian, CRNA  1.      Managed anesthesia care. 2.      Topical tetracaine drops followed by 2% Xylocaine jelly applied in the preoperative holding area.   COMPLICATIONS:  None.   TECHNIQUE:   Stop and chop   DESCRIPTION OF PROCEDURE:  The patient was examined and consented in the preoperative holding area where the aforementioned topical anesthesia was applied to the left eye and then brought back to the Operating Room where the left eye was prepped and draped in the usual sterile ophthalmic fashion and a lid speculum was placed. A paracentesis was created with the side port blade and the anterior chamber was filled with viscoelastic. A near clear corneal incision was performed with the steel keratome. A continuous curvilinear capsulorrhexis was performed with a cystotome followed by the capsulorrhexis forceps. Hydrodissection and hydrodelineation were carried out with BSS on a blunt cannula. The lens was removed in a stop and chop  technique and the remaining cortical material was removed with the irrigation-aspiration handpiece. The capsular bag was inflated with viscoelastic and the Technis ZCB00 lens was placed in the capsular bag without complication. The remaining viscoelastic was removed from the eye with the irrigation-aspiration handpiece. The wounds were hydrated. The anterior chamber was flushed with Miostat and the eye was inflated to physiologic pressure. 0.1 mL of cefuroxime concentration 10 mg/mL was placed in the anterior chamber. The wounds were found to be water tight. The eye was dressed with Vigamox. The patient was given protective glasses to wear  throughout the day and a shield with which to sleep tonight. The patient was also given drops with which to begin a drop regimen today and will follow-up with me in one day.  Implant Name Type Inv. Item Serial No. Manufacturer Lot No. LRB No. Used  LENS IMPL INTRAOC ZCB00 19.5 - [id number] Intraocular Lens LENS IMPL INTRAOC ZCB00 19.5 [id number]   Left 1   Procedure(s) with comments: CATARACT EXTRACTION PHACO AND INTRAOCULAR LENS PLACEMENT (IOC) (Left) - [place]: 00:39.5 AP%: 24.4 CDE: 9.66 Lot #[id number]  Electronically signed: Pearlee Arvizu LOUIS 07/19/2015 9:22 AM

## 2015-08-31 ENCOUNTER — Encounter: Payer: Medicare Other | Admitting: Pain Medicine

## 2015-09-01 ENCOUNTER — Encounter: Payer: Self-pay | Admitting: Pain Medicine

## 2015-09-01 ENCOUNTER — Ambulatory Visit: Payer: Medicare Other | Attending: Pain Medicine | Admitting: Pain Medicine

## 2015-09-01 VITALS — BP 119/77 | HR 80 | Temp 98.5°F | Resp 18 | Ht 60.0 in | Wt 205.0 lb

## 2015-09-01 DIAGNOSIS — E7800 Pure hypercholesterolemia, unspecified: Secondary | ICD-10-CM | POA: Diagnosis not present

## 2015-09-01 DIAGNOSIS — G894 Chronic pain syndrome: Secondary | ICD-10-CM | POA: Diagnosis not present

## 2015-09-01 DIAGNOSIS — K5903 Drug induced constipation: Secondary | ICD-10-CM | POA: Insufficient documentation

## 2015-09-01 DIAGNOSIS — Z91041 Radiographic dye allergy status: Secondary | ICD-10-CM | POA: Insufficient documentation

## 2015-09-01 DIAGNOSIS — M25551 Pain in right hip: Secondary | ICD-10-CM | POA: Diagnosis not present

## 2015-09-01 DIAGNOSIS — M419 Scoliosis, unspecified: Secondary | ICD-10-CM | POA: Insufficient documentation

## 2015-09-01 DIAGNOSIS — F329 Major depressive disorder, single episode, unspecified: Secondary | ICD-10-CM | POA: Insufficient documentation

## 2015-09-01 DIAGNOSIS — K219 Gastro-esophageal reflux disease without esophagitis: Secondary | ICD-10-CM | POA: Insufficient documentation

## 2015-09-01 DIAGNOSIS — F1190 Opioid use, unspecified, uncomplicated: Secondary | ICD-10-CM | POA: Insufficient documentation

## 2015-09-01 DIAGNOSIS — G8929 Other chronic pain: Secondary | ICD-10-CM | POA: Diagnosis not present

## 2015-09-01 DIAGNOSIS — M25559 Pain in unspecified hip: Secondary | ICD-10-CM | POA: Insufficient documentation

## 2015-09-01 DIAGNOSIS — F1120 Opioid dependence, uncomplicated: Secondary | ICD-10-CM | POA: Insufficient documentation

## 2015-09-01 DIAGNOSIS — M0579 Rheumatoid arthritis with rheumatoid factor of multiple sites without organ or systems involvement: Secondary | ICD-10-CM

## 2015-09-01 DIAGNOSIS — Z87898 Personal history of other specified conditions: Secondary | ICD-10-CM | POA: Insufficient documentation

## 2015-09-01 DIAGNOSIS — Z029 Encounter for administrative examinations, unspecified: Secondary | ICD-10-CM | POA: Insufficient documentation

## 2015-09-01 DIAGNOSIS — M069 Rheumatoid arthritis, unspecified: Secondary | ICD-10-CM | POA: Diagnosis not present

## 2015-09-01 DIAGNOSIS — F32A Depression, unspecified: Secondary | ICD-10-CM | POA: Insufficient documentation

## 2015-09-01 DIAGNOSIS — E119 Type 2 diabetes mellitus without complications: Secondary | ICD-10-CM | POA: Insufficient documentation

## 2015-09-01 DIAGNOSIS — T402X5A Adverse effect of other opioids, initial encounter: Secondary | ICD-10-CM

## 2015-09-01 DIAGNOSIS — M533 Sacrococcygeal disorders, not elsewhere classified: Secondary | ICD-10-CM | POA: Insufficient documentation

## 2015-09-01 DIAGNOSIS — Z79891 Long term (current) use of opiate analgesic: Secondary | ICD-10-CM | POA: Insufficient documentation

## 2015-09-01 DIAGNOSIS — M25552 Pain in left hip: Secondary | ICD-10-CM | POA: Diagnosis present

## 2015-09-01 DIAGNOSIS — G5600 Carpal tunnel syndrome, unspecified upper limb: Secondary | ICD-10-CM | POA: Insufficient documentation

## 2015-09-01 DIAGNOSIS — Z0289 Encounter for other administrative examinations: Secondary | ICD-10-CM

## 2015-09-01 DIAGNOSIS — Z8669 Personal history of other diseases of the nervous system and sense organs: Secondary | ICD-10-CM

## 2015-09-01 DIAGNOSIS — Z5181 Encounter for therapeutic drug level monitoring: Secondary | ICD-10-CM | POA: Insufficient documentation

## 2015-09-01 DIAGNOSIS — F1110 Opioid abuse, uncomplicated: Secondary | ICD-10-CM | POA: Insufficient documentation

## 2015-09-01 HISTORY — DX: Radiographic dye allergy status: Z91.041

## 2015-09-01 MED ORDER — HYDROCODONE-ACETAMINOPHEN 10-325 MG PO TABS: 1.0000 | ORAL_TABLET | Freq: Three times a day (TID) | ORAL | Status: DC | PRN

## 2015-09-01 MED ORDER — BENEFIBER PO POWD: ORAL | Status: DC

## 2015-09-01 MED ORDER — LUBIPROSTONE 24 MCG PO CAPS: 24.0000 ug | ORAL_CAPSULE | Freq: Two times a day (BID) | ORAL | Status: DC

## 2015-09-01 NOTE — Progress Notes (Signed)
Patient's Name: Mariah Poole MRN: [id number] DOB: 1946-05-01 DOS: 09/01/2015  Primary Reason(s) for Visit: Encounter for Medication Management. CC: Hip Pain   HPI:   [name] is a 69 y.o. year old, female patient, who returns today as an established patient. She has Encounter for therapeutic drug level monitoring; Long term current use of opiate analgesic; Uncomplicated opioid dependence (HCC); Opiate use; Chronic pain; Chronic pain syndrome; Chronic right hip pain; Opiate analgesic use agreement exists; Sacral pain; Morbid obesity (HCC); History of carpal tunnel syndrome, right side; Rheumatoid arthritis (HCC); Depression; Opioid-induced constipation; and History of contrast media allergy. (IVP dye) on her problem list.. Her primarily concern today is the Hip Pain     The patient is doing well upon on her medications however, she been having some problems with constipation. Today we spent some time talking about what to do about it. It turns out that she has been using laxative on a regular basis. Sometimes she will take up to 6 per day. I have explained to her that she shouldn't be doing this and we went over the correct way of treating it. Today I will provide her with a prescription for Benefiber and Amitiza.  Pharmacotherapy Review: Side-effects or Adverse reactions: Constipation. Effectiveness: Described as relatively effective, allowing for increase in activities of daily living (ADL). Onset of action: Within expected pharmacological parameters. Duration of action: Within normal limits for medication. Peak effect: Timing and results are as within normal expected parameters. [place] PMP: Compliant with practice rules and regulations. DST: Compliant with practice rules and regulations. Lab work: No new labs ordered by our practice. Treatment compliance: Compliant. Substance Use Disorder (SUD) Risk Level: Low Planned course of action: Continue therapy as is.  Allergies: Mariah Poole is  allergic to contrast media.  Meds: The patient has a current medication list which includes the following prescription(s): albuterol, vitamin c, atenolol, vitamin d3, furosemide, glipizide, hydrochlorothiazide, hydrocodone-acetaminophen, losartan, magnesium oxide, metformin, pantoprazole, potassium chloride sa, pravastatin, hydrocodone-acetaminophen, lubiprostone, and benefiber. Requested Prescriptions   Signed Prescriptions Disp Refills  . HYDROcodone-acetaminophen (NORCO) 10-325 MG tablet 90 tablet 0    Sig: Take 1 tablet by mouth every 8 (eight) hours as needed.  Marland Kitchen HYDROcodone-acetaminophen (NORCO) 10-325 MG tablet 90 tablet 0    Sig: Take 1 tablet by mouth every 8 (eight) hours as needed for severe pain.  . Wheat Dextrin (BENEFIBER) POWD 500 g PRN    Sig: Stir 2 tsp into 4-8 oz of any non-carbonated beverage or soft food (hot or cold). Stir well until dissolved. Take three times daily  . lubiprostone (AMITIZA) 24 MCG capsule 60 capsule PRN    Sig: Take 1 capsule (24 mcg total) by mouth 2 (two) times daily with a meal. Swallow the medication whole. Do not break or chew the medication.    ROS: Constitutional: Afebrile, no chills, well hydrated and well nourished Gastrointestinal: negative Musculoskeletal:negative Neurological: negative Behavioral/Psych: negative  PFSH: Medical:  Mariah Poole  has a past medical history of Shortness of breath dyspnea; Hypertension; History of hiatal hernia; GERD (gastroesophageal reflux disease); Arthritis; Depression; Diabetes mellitus without complication (HCC); Hypercholesteremia; COPD (chronic obstructive pulmonary disease) (HCC); Hiatal hernia; Scoliosis; Anemia; Morbid obesity (HCC); and History of contrast media allergy. (IVP dye) (09/01/2015). Family: family history includes Diabetes in her mother; Heart disease in her father. Surgical:  has past surgical history that includes Abdominal hysterectomy; Cataract extraction w/ intraocular lens implant  (Right); Gastric bypass; Hernia repair; and Cataract extraction w/PHACO (Left, 07/19/2015). Tobacco:  reports that she has never smoked. She does not have any smokeless tobacco history on file. Alcohol:  reports that she does not drink alcohol. Drug:  has no drug history on file.  Physical Exam: Vitals:  Today's Vitals   09/01/15 1007 09/01/15 1009  BP: 119/77   Pulse: 80   Temp: 98.5 F (36.9 C)   Resp: 18   Height: 5' (1.524 m)   Weight: 205 lb (92.987 kg)   SpO2: 99%   PainSc: 4  4   PainLoc: Hip   Calculated BMI: Body mass index is 40.04 kg/(m^2). General appearance: alert, cooperative, appears stated age, mild distress and morbidly obese Eyes: conjunctivae/corneas clear. PERRL, EOM's intact. Fundi benign. Lungs: No evidence respiratory distress, no audible rales or ronchi and no use of accessory muscles of respiration Neck: no adenopathy, no carotid bruit, no JVD, supple, symmetrical, trachea midline and thyroid not enlarged, symmetric, no tenderness/mass/nodules Back: symmetric, no curvature. ROM normal. No CVA tenderness. Extremities: extremities normal, atraumatic, no cyanosis or edema Pulses: 2+ and symmetric Skin: Skin color, texture, turgor normal. No rashes or lesions Neurologic: Gait: Antalgic    Assessment: Encounter Diagnosis:  Primary Diagnosis: Chronic right hip pain [M25.551, G89.29]  Plan: Lilymarie was seen today for hip pain.  Diagnoses and all orders for this visit:  Chronic right hip pain -     HYDROcodone-acetaminophen (NORCO) 10-325 MG tablet; Take 1 tablet by mouth every 8 (eight) hours as needed. -     HYDROcodone-acetaminophen (NORCO) 10-325 MG tablet; Take 1 tablet by mouth every 8 (eight) hours as needed for severe pain.  Chronic pain syndrome  Chronic pain  Opiate use  Uncomplicated opioid dependence (HCC)  Long term current use of opiate analgesic -     Drugs of abuse screen w/o alc, rtn urine-sln; Future  Encounter for therapeutic drug  level monitoring  Opiate analgesic use agreement exists  Sacral pain  Morbid obesity, unspecified obesity type (HCC)  History of carpal tunnel syndrome, right side  Rheumatoid arthritis involving multiple sites with positive rheumatoid factor (HCC)  Depression  Opioid-induced constipation -     Wheat Dextrin (BENEFIBER) POWD; Stir 2 tsp into 4-8 oz of any non-carbonated beverage or soft food (hot or cold). Stir well until dissolved. Take three times daily -     lubiprostone (AMITIZA) 24 MCG capsule; Take 1 capsule (24 mcg total) by mouth 2 (two) times daily with a meal. Swallow the medication whole. Do not break or chew the medication.  History of contrast media allergy. (IVP dye)     There are no Patient Instructions on file for this visit. Medications discontinued today:  Medications Discontinued During This Encounter  Medication Reason  . HYDROcodone-acetaminophen (NORCO) 10-325 MG per tablet Reorder   Medications administered today:  Ms. Suppa had no medications administered during this visit.  Primary Care Physician: Leanna Sato, MD Location: [facility] Note by: Sydnee Levans. Laban Emperor, M.D, DABA, DABAPM, DABPM, DABIPP, FIPP

## 2015-09-01 NOTE — Progress Notes (Signed)
Safety precautions to be maintained throughout the outpatient stay will include: orient to surroundings, keep bed in low position, maintain call bell within reach at all times, provide assistance with transfer out of bed and ambulation. Hydrocodone pill count #26

## 2015-09-26 ENCOUNTER — Other Ambulatory Visit: Payer: Self-pay | Admitting: Pain Medicine

## 2015-10-27 ENCOUNTER — Emergency Department
Admission: EM | Admit: 2015-10-27 | Discharge: 2015-10-27 | Disposition: A | Discharge status: Home / Self Care | Payer: Medicare Other | Attending: Emergency Medicine | Admitting: Emergency Medicine

## 2015-10-27 ENCOUNTER — Encounter: Payer: Self-pay | Admitting: *Deleted

## 2015-10-27 ENCOUNTER — Emergency Department: Payer: Medicare Other

## 2015-10-27 DIAGNOSIS — J441 Chronic obstructive pulmonary disease with (acute) exacerbation: Secondary | ICD-10-CM | POA: Insufficient documentation

## 2015-10-27 DIAGNOSIS — I10 Essential (primary) hypertension: Secondary | ICD-10-CM | POA: Diagnosis not present

## 2015-10-27 DIAGNOSIS — Z7984 Long term (current) use of oral hypoglycemic drugs: Secondary | ICD-10-CM | POA: Diagnosis not present

## 2015-10-27 DIAGNOSIS — R079 Chest pain, unspecified: Secondary | ICD-10-CM | POA: Insufficient documentation

## 2015-10-27 DIAGNOSIS — Z79899 Other long term (current) drug therapy: Secondary | ICD-10-CM | POA: Diagnosis not present

## 2015-10-27 DIAGNOSIS — R197 Diarrhea, unspecified: Secondary | ICD-10-CM | POA: Diagnosis not present

## 2015-10-27 DIAGNOSIS — R1110 Vomiting, unspecified: Secondary | ICD-10-CM

## 2015-10-27 DIAGNOSIS — E119 Type 2 diabetes mellitus without complications: Secondary | ICD-10-CM | POA: Insufficient documentation

## 2015-10-27 DIAGNOSIS — R1012 Left upper quadrant pain: Secondary | ICD-10-CM | POA: Diagnosis not present

## 2015-10-27 DIAGNOSIS — R112 Nausea with vomiting, unspecified: Secondary | ICD-10-CM | POA: Insufficient documentation

## 2015-10-27 LAB — COMPREHENSIVE METABOLIC PANEL
ALBUMIN: 4 g/dL (ref 3.5–5.0)
ALT: 16 U/L (ref 14–54)
AST: 20 U/L (ref 15–41)
Alkaline Phosphatase: 83 U/L (ref 38–126)
Anion gap: 11 (ref 5–15)
BUN: 23 mg/dL — AB (ref 6–20)
CHLORIDE: 95 mmol/L — AB (ref 101–111)
CO2: 32 mmol/L (ref 22–32)
Calcium: 9.5 mg/dL (ref 8.9–10.3)
Creatinine, Ser: 0.93 mg/dL (ref 0.44–1.00)
GFR calc Af Amer: >60 mL/min (ref >60)
GFR calc non Af Amer: >60 mL/min (ref >60)
GLUCOSE: 121 mg/dL — AB (ref 65–99)
POTASSIUM: 3.3 mmol/L — AB (ref 3.5–5.1)
Sodium: 138 mmol/L (ref 135–145)
Total Bilirubin: 0.7 mg/dL (ref 0.3–1.2)
Total Protein: 8.1 g/dL (ref 6.5–8.1)

## 2015-10-27 LAB — LIPASE, BLOOD: LIPASE: 19 U/L (ref 11–51)

## 2015-10-27 LAB — TROPONIN I

## 2015-10-27 LAB — CBC
HCT: 43.1 % (ref 35.0–47.0)
Hemoglobin: 13.7 g/dL (ref 12.0–16.0)
MCH: 27 pg (ref 26.0–34.0)
MCHC: 31.8 g/dL — ABNORMAL LOW (ref 32.0–36.0)
MCV: 84.7 fL (ref 80.0–100.0)
PLATELETS: 346 10*3/uL (ref 150–440)
RBC: 5.09 MIL/uL (ref 3.80–5.20)
RDW: 14.2 % (ref 11.5–14.5)
WBC: 6 10*3/uL (ref 3.6–11.0)

## 2015-10-27 MED ORDER — ONDANSETRON HCL 4 MG PO TABS: 4.0000 mg | ORAL_TABLET | Freq: Every day | ORAL | Status: DC | PRN

## 2015-10-27 MED ORDER — GI COCKTAIL ~~LOC~~
30.0000 mL | Freq: Once | ORAL | Status: AC
  Administered 2015-10-27: 30 mL via ORAL
  Filled 2015-10-27: qty 30

## 2015-10-27 NOTE — ED Notes (Signed)
Patient transported to X-ray 

## 2015-10-27 NOTE — ED Notes (Signed)
Pt states all week she has felt tired, SOB and chest pain, she saw her PCP this AM, states diaherra as well, chest pain radiates to her left breast, pt awake and alert in no acute distress

## 2015-10-27 NOTE — ED Provider Notes (Signed)
[facility] Emergency Department Provider Note  ____________________________________________  Time seen: Approximately 1430 PM  I have reviewed the triage vital signs and the nursing notes.   HISTORY  Chief Complaint Chest Pain    HPI Mariah Poole is a 69 y.o. female with a history of hypertension and GERD who is presenting today with intermittent chest pain over the past 5 days as well as cramping upper abdominal pain, nausea and diarrhea. She says she has had up to 2 episodes of vomiting per day in 2-3 episodes of diarrhea per day. She denies any recent hospitalization or antibiotics. She denies any blood in her vomitus or stool. She denies any pain right now. Says that the pains come at random. Says the chest pain is like a squeezing pain to the middle of the chest. She says it is nonradiating. It can last up to several minutes. She is also had shortness of breath with exertion over the past week. No chest pain with exertion over the past week. She says that she does have chest pain when she leans over to the left. Denies any known sick contacts. No burning with urination.   Past Medical History  Diagnosis Date  . Shortness of breath dyspnea     with exertion  . Hypertension   . History of hiatal hernia   . GERD (gastroesophageal reflux disease)   . Arthritis   . Depression   . Diabetes mellitus without complication (HCC)   . Hypercholesteremia   . COPD (chronic obstructive pulmonary disease) (HCC)   . Hiatal hernia   . Scoliosis   . Anemia   . Morbid obesity (HCC)   . History of contrast media allergy. (IVP dye) 09/01/2015    Patient Active Problem List   Diagnosis Date Noted  . Encounter for therapeutic drug level monitoring 09/01/2015  . Long term current use of opiate analgesic 09/01/2015  . Uncomplicated opioid dependence (HCC) 09/01/2015  . Opiate use 09/01/2015  . Chronic pain 09/01/2015  . Chronic pain syndrome 09/01/2015  . Chronic  right hip pain 09/01/2015  . Opiate analgesic use agreement exists 09/01/2015  . Sacral pain 09/01/2015  . Morbid obesity (HCC) 09/01/2015  . History of carpal tunnel syndrome, right side 09/01/2015  . Rheumatoid arthritis (HCC) 09/01/2015  . Depression 09/01/2015  . Opioid-induced constipation 09/01/2015  . History of contrast media allergy. (IVP dye) 09/01/2015    Past Surgical History  Procedure Laterality Date  . Abdominal hysterectomy    . Cataract extraction w/ intraocular lens implant Right   . Gastric bypass      with partial reversal and incisional hernia repair  . Hernia repair    . Cataract extraction w/phaco Left 07/19/2015    Procedure: CATARACT EXTRACTION PHACO AND INTRAOCULAR LENS PLACEMENT (IOC);  Surgeon: Galen Manila, MD;  Location: [facility] ORS;  Service: Ophthalmology;  Laterality: Left;  [place]: 00:39.5 AP%: 24.4 CDE: 9.66 Lot #[id number]    Current Outpatient Rx  Name  Route  Sig  Dispense  Refill  . albuterol (PROVENTIL HFA;VENTOLIN HFA) 108 (90 BASE) MCG/ACT inhaler   Inhalation   Inhale 2 puffs into the lungs every 4 (four) hours as needed for wheezing or shortness of breath.         . Ascorbic Acid (VITAMIN C) 1000 MG tablet   Oral   Take 1,000 mg by mouth daily.         Marland Kitchen atenolol (TENORMIN) 25 MG tablet   Oral   Take  25 mg by mouth daily.          . Cholecalciferol (VITAMIN D3) 2000 UNITS TABS   Oral   Take 1 tablet by mouth daily.         . DULoxetine (CYMBALTA) 30 MG capsule   Oral   Take 30 mg by mouth 2 (two) times daily.         . furosemide (LASIX) 20 MG tablet   Oral   Take 20 mg by mouth daily.         Marland Kitchen glipiZIDE (GLUCOTROL XL) 5 MG 24 hr tablet   Oral   Take 5 mg by mouth daily with breakfast.         . hydrochlorothiazide (HYDRODIURIL) 25 MG tablet   Oral   Take 25 mg by mouth daily.         Marland Kitchen HYDROcodone-acetaminophen (NORCO) 10-325 MG tablet   Oral   Take 1 tablet by mouth every 8 (eight) hours as needed  for severe pain.   90 tablet   0     Do not place this medication, or any other prescri ...   . losartan (COZAAR) 25 MG tablet   Oral   Take 25 mg by mouth daily.         Marland Kitchen lubiprostone (AMITIZA) 24 MCG capsule   Oral   Take 1 capsule (24 mcg total) by mouth 2 (two) times daily with a meal. Swallow the medication whole. Do not break or chew the medication.   60 capsule   PRN     Do not place this medication, or any other prescri ...   . magnesium oxide (MAG-OX) 400 MG tablet   Oral   Take 400 mg by mouth daily.         . metFORMIN (GLUCOPHAGE-XR) 500 MG 24 hr tablet   Oral   Take 500 mg by mouth daily with breakfast.          . pantoprazole (PROTONIX) 40 MG tablet   Oral   Take 40 mg by mouth daily.         . potassium chloride SA (K-DUR,KLOR-CON) 20 MEQ tablet   Oral   Take 20 mEq by mouth daily.          . pravastatin (PRAVACHOL) 20 MG tablet   Oral   Take 20 mg by mouth at bedtime.          . Wheat Dextrin (BENEFIBER) POWD      Stir 2 tsp into 4-8 oz of any non-carbonated beverage or soft food (hot or cold). Stir well until dissolved. Take three times daily Patient taking differently: Take 10 g by mouth 3 (three) times daily.    500 g   PRN     Do not place this medication, or any other prescri ...     Allergies Contrast media  Family History  Problem Relation Age of Onset  . Diabetes Mother   . Heart disease Father     Social History Social History  Substance Use Topics  . Smoking status: Never Smoker   . Smokeless tobacco: None  . Alcohol Use: No    Review of Systems Constitutional: No fever/chills Eyes: No visual changes. ENT: No sore throat. Cardiovascular: As above  Respiratory: As above  Gastrointestinal:   No constipation. Genitourinary: Negative for dysuria. Musculoskeletal: Negative for back pain. Skin: Negative for rash. Neurological: Negative for headaches, focal weakness or numbness.  10-point ROS otherwise  negative.  ____________________________________________  PHYSICAL EXAM:  VITAL SIGNS: ED Triage Vitals  Enc Vitals Group     BP 10/27/15 1445 128/72 mmHg     Pulse Rate 10/27/15 1445 74     Resp 10/27/15 1445 18     Temp 10/27/15 1445 98.6 F (37 C)     Temp Source 10/27/15 1445 Oral     SpO2 10/27/15 1445 98 %     Weight 10/27/15 1445 219 lb (99.338 kg)     Height 10/27/15 1445 5' (1.524 m)     Head Cir --      Peak Flow --      Pain Score --      Pain Loc --      Pain Edu? --      Excl. in GC? --     Constitutional: Alert and oriented. Well appearing and in no acute distress. Eyes: Conjunctivae are normal. PERRL. EOMI. Head: Atraumatic. Nose: No congestion/rhinnorhea. Mouth/Throat: Mucous membranes are moist.  Oropharynx non-erythematous. Neck: No stridor.   Cardiovascular: Normal rate, regular rhythm. Grossly normal heart sounds.  Good peripheral circulation. Respiratory: Normal respiratory effort.  No retractions. Lungs CTAB. Gastrointestinal: Soft and nontender. No distention. No abdominal bruits. No CVA tenderness. Musculoskeletal: No lower extremity tenderness nor edema.  No joint effusions. Neurologic:  Normal speech and language. No gross focal neurologic deficits are appreciated. No gait instability. Skin:  Skin is warm, dry and intact. No rash noted. Psychiatric: Mood and affect are normal. Speech and behavior are normal.  ____________________________________________   LABS (all labs ordered are listed, but only abnormal results are displayed)  Labs Reviewed  CBC - Abnormal; Notable for the following:    MCHC 31.8 (*)    All other components within normal limits  COMPREHENSIVE METABOLIC PANEL - Abnormal; Notable for the following:    Potassium 3.3 (*)    Chloride 95 (*)    Glucose, Bld 121 (*)    BUN 23 (*)    All other components within normal limits  TROPONIN I  LIPASE, BLOOD  TROPONIN I    ____________________________________________  EKG  ED ECG REPORT I, Arelia Longest, the attending physician, personally viewed and interpreted this ECG.   Date: 10/27/2015  EKG Time: 1444  Rate: 73  Rhythm: normal sinus rhythm  Axis: Normal axis  Intervals:right bundle branch block  ST&T Change: No ST segment elevation or depression. No abnormal T-wave inversion.  New right bundle branch block from EKG done in 2014.  ____________________________________________  RADIOLOGY  No acute findings on the chest x-ray. Moderate hiatal hernia. ____________________________________________   PROCEDURES    ____________________________________________   INITIAL IMPRESSION / ASSESSMENT AND PLAN / ED COURSE  Pertinent labs & imaging results that were available during my care of the patient were reviewed by me and considered in my medical decision making (see chart for details).  ----------------------------------------- 6:18 PM on 10/27/2015 -----------------------------------------  Discussed case with Dr. Ann Maki shows of the cardiology service who agrees to see the patient is an outpatient. I discussed the bundle branch block with him at this time.  ----------------------------------------- 7:29 PM on 10/27/2015 -----------------------------------------  Patient continues to rest comfortably at this time. Still denying any chest pain. Also with resolution of abdominal pain after the GI cocktail. I palpated left upper quadrant and there is no tenderness there at this time. She said that she did have some mild chest pain earlier in the visit but if the GI cocktail she is pain-free. I suspect the patient does  have a certain degree of decompensation from her recent illness. I suspect that she doesn't a viral illness causing the nausea vomiting and diarrhea. Possible exacerbation of her reflux causing the chest pain. However, the patient does have risk factors for cardiac  disease as well as new right bundle branch block. Because of this I believe she'll need follow-up with cardiology. She understands the plan to follow-up with cardiology will call tomorrow for an urgent follow-up appointment. We also discussed return precautions including any worsening or concerning symptoms especially worsening chest pain. ____________________________________________   FINAL CLINICAL IMPRESSION(S) / ED DIAGNOSES  Nausea and vomiting. Abdominal pain. Diarrhea. Chest pain.    Myrna Blazer, MD 10/27/15 [phone]

## 2015-12-05 DIAGNOSIS — E119 Type 2 diabetes mellitus without complications: Secondary | ICD-10-CM | POA: Insufficient documentation

## 2015-12-05 DIAGNOSIS — I10 Essential (primary) hypertension: Secondary | ICD-10-CM | POA: Insufficient documentation

## 2015-12-06 DIAGNOSIS — T148XXA Other injury of unspecified body region, initial encounter: Secondary | ICD-10-CM | POA: Insufficient documentation

## 2015-12-07 DIAGNOSIS — Z789 Other specified health status: Secondary | ICD-10-CM | POA: Insufficient documentation

## 2015-12-07 DIAGNOSIS — Z9049 Acquired absence of other specified parts of digestive tract: Secondary | ICD-10-CM | POA: Insufficient documentation

## 2015-12-07 DIAGNOSIS — Z9884 Bariatric surgery status: Secondary | ICD-10-CM | POA: Insufficient documentation

## 2015-12-07 DIAGNOSIS — IMO0001 Reserved for inherently not codable concepts without codable children: Secondary | ICD-10-CM | POA: Insufficient documentation

## 2015-12-13 ENCOUNTER — Encounter: Payer: Self-pay | Admitting: Pain Medicine

## 2015-12-13 ENCOUNTER — Other Ambulatory Visit: Payer: Self-pay | Admitting: Pain Medicine

## 2015-12-13 ENCOUNTER — Ambulatory Visit: Payer: Medicare Other | Attending: Pain Medicine | Admitting: Pain Medicine

## 2015-12-13 VITALS — BP 132/65 | HR 65 | Temp 98.6°F | Resp 18 | Ht 60.0 in | Wt 216.0 lb

## 2015-12-13 DIAGNOSIS — G8929 Other chronic pain: Secondary | ICD-10-CM

## 2015-12-13 DIAGNOSIS — M25551 Pain in right hip: Secondary | ICD-10-CM

## 2015-12-13 DIAGNOSIS — Z5181 Encounter for therapeutic drug level monitoring: Secondary | ICD-10-CM | POA: Diagnosis not present

## 2015-12-13 DIAGNOSIS — Z79891 Long term (current) use of opiate analgesic: Secondary | ICD-10-CM | POA: Diagnosis not present

## 2015-12-13 DIAGNOSIS — M47896 Other spondylosis, lumbar region: Secondary | ICD-10-CM

## 2015-12-13 MED ORDER — HYDROCODONE-ACETAMINOPHEN 10-325 MG PO TABS: 1.0000 | ORAL_TABLET | Freq: Three times a day (TID) | ORAL | Status: DC | PRN

## 2015-12-13 NOTE — Progress Notes (Signed)
Hydrocodone pill count #0   Ran out 12-03-15

## 2015-12-13 NOTE — Progress Notes (Signed)
Safety precautions to be maintained throughout the outpatient stay will include: orient to surroundings, keep bed in low position, maintain call bell within reach at all times, provide assistance with transfer out of bed and ambulation. Patient had gallbladder surgery last week.

## 2015-12-13 NOTE — Progress Notes (Signed)
Patient's Name: Mariah Poole MRN: [id number] DOB: 1946/10/24 DOS: 12/13/2015  Primary Reason(s) for Visit: Encounter for Medication Management CC: Back Pain   HPI  Mariah Poole is a 70 y.o. year old, female patient, who returns today as an established patient. She has Encounter for therapeutic drug level monitoring; Long term current use of opiate analgesic; Uncomplicated opioid dependence (HCC); Opiate use; Chronic pain; Chronic pain syndrome; Chronic right hip pain; Opiate analgesic use agreement exists; Sacral pain; Morbid obesity (HCC); History of carpal tunnel syndrome, right side; Rheumatoid arthritis (HCC); Depression; Opioid-induced constipation; History of contrast media allergy. (IVP dye); Diabetes mellitus (HCC); BP (high blood pressure); Other specified health status; Bariatric surgery status; History of cholecystectomy; and Lumbar spondylosis on her problem list.. Her primarily concern today is the Back Pain   The patient returns to the clinics today for pharmacological management of her chronic pain. Right about the time when she was running out of her medications, she began to experience considerable nausea and vomiting which she thought was secondary to withdrawals. She ended up going to the emergency room and it turned out to be a cholelithiasis complicated by the fact that she had had a gastric bypass and they could not move the stones. She has been dealing with this since early January and because she ended up in the hospital and was unable to keep her appointment, she was provided with some oxycodone for the pain. The patient indicates that she does not like how that medication feels since it causes her to have difficulty breathing. Today we will discontinue the oxycodone up her back on her hydrocodone.  Reported Pain Score: 5 , clinically she looks like a 1-2/10. Reported level is inconsistent with clinical obrservations. Pain Type: Chronic pain Pain Location: Back Pain  Orientation: Lower Pain Descriptors / Indicators: Aching Pain Frequency: Constant  Date of Last Visit: 09/01/15 Service Provided on Last Visit: Med Refill  Pharmacotherapy  Medication(s): We prescribed for her Norco 10/325 one tablet every 8 hours when necessary for pain. Onset of action: Within expected pharmacological parameters Time to Peak effect: Timing and results are as within normal expected parameters Analgesic Effect: More than 50% Activity Facilitation: Medication(s) allow patient to sit, stand, walk, and do the basic ADLs Perceived Effectiveness: Described as relatively effective, allowing for increase in activities of daily living (ADL) Side-effects or Adverse reactions: None reported Duration of action: Within normal limits for medication [place] PMP: Compliant with practice rules and regulations UDS Results: Her last UDS on 09/01/2015 came back within normal limits with no unexpected results. UDS Interpretation: Patient appears to be compliant with practice rules and regulations Medication Assessment Form: Reviewed. Patient indicates being compliant with therapy Treatment compliance: Compliant Substance Use Disorder (SUD) Risk Level: Low Pharmacologic Plan: Continue therapy as is. Because she was hospitalized and she was given some oxycodone to keep her until she could come in, we expect the UDS and we have ordered today to come back positive for oxycodone.  Lab Work: Illicit Drugs No results found for: THCU, COCAINSCRNUR, PCPSCRNUR, MDMA, AMPHETMU, METHADONE, ETOH  Inflammation Markers Lab Results  Component Value Date   ESRSEDRATE 37* 01/06/2013    Renal Function Lab Results  Component Value Date   BUN 23* 10/27/2015   CREATININE 0.93 10/27/2015   GFRAA >60 10/27/2015   GFRNONAA >60 10/27/2015    Hepatic Function Lab Results  Component Value Date   AST 20 10/27/2015   ALT 16 10/27/2015   ALBUMIN 4.0 10/27/2015  Electrolytes Lab Results  Component  Value Date   NA 138 10/27/2015   K 3.3* 10/27/2015   CL 95* 10/27/2015   CALCIUM 9.5 10/27/2015   MG 1.4* 12/16/2012    Allergies  Mariah Poole is allergic to contrast media.  Meds  The patient has a current medication list which includes the following prescription(s): albuterol, vitamin c, atenolol, vitamin d3, duloxetine, furosemide, glipizide, hydrochlorothiazide, hydrocodone-acetaminophen, losartan, lubiprostone, magnesium oxide, metformin, multi-vitamins, ondansetron, pantoprazole, potassium chloride sa, pravastatin, ursodiol, benefiber, hydrocodone-acetaminophen, and hydrocodone-acetaminophen.  Current Outpatient Prescriptions on File Prior to Visit  Medication Sig  . albuterol (PROVENTIL HFA;VENTOLIN HFA) 108 (90 BASE) MCG/ACT inhaler Inhale 2 puffs into the lungs every 4 (four) hours as needed for wheezing or shortness of breath.  . Ascorbic Acid (VITAMIN C) 1000 MG tablet Take 1,000 mg by mouth daily.  Marland Kitchen atenolol (TENORMIN) 25 MG tablet Take 25 mg by mouth daily.   . Cholecalciferol (VITAMIN D3) 2000 UNITS TABS Take 1 tablet by mouth daily.  . DULoxetine (CYMBALTA) 30 MG capsule Take 30 mg by mouth 2 (two) times daily.  . furosemide (LASIX) 20 MG tablet Take 20 mg by mouth daily.  Marland Kitchen glipiZIDE (GLUCOTROL XL) 5 MG 24 hr tablet Take 5 mg by mouth daily with breakfast.  . hydrochlorothiazide (HYDRODIURIL) 25 MG tablet Take 25 mg by mouth daily.  Marland Kitchen losartan (COZAAR) 25 MG tablet Take 25 mg by mouth daily.  Marland Kitchen lubiprostone (AMITIZA) 24 MCG capsule Take 1 capsule (24 mcg total) by mouth 2 (two) times daily with a meal. Swallow the medication whole. Do not break or chew the medication.  . magnesium oxide (MAG-OX) 400 MG tablet Take 400 mg by mouth daily.  . ondansetron (ZOFRAN) 4 MG tablet Take 1 tablet (4 mg total) by mouth daily as needed for nausea or vomiting.  . pantoprazole (PROTONIX) 40 MG tablet Take 40 mg by mouth daily.  . potassium chloride SA (K-DUR,KLOR-CON) 20 MEQ tablet  Take 20 mEq by mouth daily.   . pravastatin (PRAVACHOL) 20 MG tablet Take 20 mg by mouth at bedtime. Reported on 12/13/2015  . Wheat Dextrin (BENEFIBER) POWD Stir 2 tsp into 4-8 oz of any non-carbonated beverage or soft food (hot or cold). Stir well until dissolved. Take three times daily (Patient taking differently: Take 10 g by mouth 3 (three) times daily. )   No current facility-administered medications on file prior to visit.    ROS  Constitutional: Afebrile, no chills, well hydrated and well nourished Gastrointestinal: negative Musculoskeletal:negative Neurological: negative Behavioral/Psych: negative  PFSH  Medical:  Ms. Stephney  has a past medical history of Shortness of breath dyspnea; Hypertension; History of hiatal hernia; GERD (gastroesophageal reflux disease); Arthritis; Depression; Diabetes mellitus without complication (HCC); Hypercholesteremia; COPD (chronic obstructive pulmonary disease) (HCC); Hiatal hernia; Scoliosis; Anemia; Morbid obesity (HCC); and History of contrast media allergy. (IVP dye) (09/01/2015). Family: family history includes Diabetes in her mother; Heart disease in her father. Surgical:  has past surgical history that includes Abdominal hysterectomy; Cataract extraction w/ intraocular lens implant (Right); Gastric bypass; Hernia repair; Cataract extraction w/PHACO (Left, 07/19/2015); and Cholecystectomy. Tobacco:  reports that she has never smoked. She does not have any smokeless tobacco history on file. Alcohol:  reports that she does not drink alcohol. Drug:  has no drug history on file.  Physical Exam  Vitals:  Today's Vitals   12/13/15 1346 12/13/15 1347  BP:  132/65  Pulse: 65   Temp: 98.6 F (37 C)   Resp:  18   Height: 5' (1.524 m)   Weight: 216 lb (97.977 kg)   SpO2: 98%   PainSc: 5  5   PainLoc: Back     Calculated BMI: Body mass index is 42.18 kg/(m^2).  General appearance: alert, cooperative, appears stated age, mild distress and  morbidly obese Eyes: PERLA Respiratory: No evidence respiratory distress, no audible rales or ronchi and no use of accessory muscles of respiration  Cervical Spine Inspection: Normal anatomy Alignment: Symetrical ROM: Decreased  Upper Extremities Inspection: No gross anomalies detected ROM: Adequate Sensory: Normal Motor: Unremarkable  Thoracic Spine Inspection: No gross anomalies detected Alignment: Symetrical ROM: Adequate  Lumbar Spine Inspection: No gross anomalies detected Alignment: Symetrical ROM: Decreased Gait: Antalgic (limping)  Lower Extremities Inspection: No gross anomalies detected ROM: Adequate Sensory: Normal Motor: Unremarkable  Assessment & Plan  Primary Diagnosis & Pertinent Problem List: The primary encounter diagnosis was Chronic pain. Diagnoses of Long term current use of opiate analgesic, Encounter for therapeutic drug level monitoring, Chronic right hip pain, and Other osteoarthritis of spine, lumbar region were also pertinent to this visit.  Visit Diagnosis: 1. Chronic pain   2. Long term current use of opiate analgesic   3. Encounter for therapeutic drug level monitoring   4. Chronic right hip pain   5. Other osteoarthritis of spine, lumbar region     Assessment: No problem-specific assessment & plan notes found for this encounter.   Plan of Care  Pharmacotherapy (Medications Ordered): Meds ordered this encounter  Medications  . HYDROcodone-acetaminophen (NORCO) 10-325 MG tablet    Sig: Take 1 tablet by mouth every 8 (eight) hours as needed for moderate pain or severe pain.    Dispense:  90 tablet    Refill:  0    Do not place this medication, or any other prescription from our practice, on "Automatic Refill". Patient may have prescription filled one day early if pharmacy is closed on scheduled refill date. Do not fill until: 01/12/16 To last until: 02/08/16  . HYDROcodone-acetaminophen (NORCO) 10-325 MG tablet    Sig: Take 1  tablet by mouth every 8 (eight) hours as needed for moderate pain or severe pain.    Dispense:  90 tablet    Refill:  0    Do not place this medication, or any other prescription from our practice, on "Automatic Refill". Patient may have prescription filled one day early if pharmacy is closed on scheduled refill date. Do not fill until: 02/08/16 To last until: 03/09/16  . HYDROcodone-acetaminophen (NORCO) 10-325 MG tablet    Sig: Take 1 tablet by mouth every 8 (eight) hours as needed for moderate pain or severe pain.    Dispense:  90 tablet    Refill:  0    Do not place this medication, or any other prescription from our practice, on "Automatic Refill". Patient may have prescription filled one day early if pharmacy is closed on scheduled refill date. Do not fill until: 12/13/15 To last until: 01/12/16    [facility] & Procedure Ordered: Orders Placed This Encounter  Procedures  . Drugs of abuse screen w/o alc, rtn urine-sln    Volume: 10 ml(s). Minimum 3 ml of urine is needed. Document temperature of fresh sample. Indications: Long term (current) use of opiate analgesic (Z79.891) Test#: [id number] ([employer]ox[employer] Select-13)    Imaging Ordered: None  Interventional Therapies: Scheduled: None at this time. PRN Procedures: None at this time.    Referral(s) or Consult(s): None required at this time.  Medications  administered during this visit: Ms. Bohnet had no medications administered during this visit.  Future Appointments Date Time Provider Department Center  03/05/2016 1:40 PM Delano Metz, MD [facility] None    Primary Care Physician: Leanna Sato, MD Location: [facility] Note by: Sydnee Levans. Laban Emperor, M.D, DABA, DABAPM, DABPM, DABIPP, FIPP

## 2015-12-22 LAB — TOXASSURE SELECT 13 (MW), URINE: PDF: 0

## 2015-12-31 ENCOUNTER — Encounter: Payer: Self-pay | Admitting: Pain Medicine

## 2015-12-31 NOTE — Progress Notes (Signed)
Quick Note:  The findings of this UDT were reported as abnormal due to inconsistencies with expected results. An unreported substance was identified in the sample. Expectations were based on the medication history provided by the patient at the time of sample collection. These results are of concern due to the following possibilities: 1. The use of multiple providers, suggesting the illegal practice of "Doctor Shopping", in violation of our medication agreement. 2. The use of unsanctioned and possibly illegal substances. ______

## 2016-03-05 ENCOUNTER — Encounter: Payer: Medicare Other | Admitting: Pain Medicine

## 2016-03-08 ENCOUNTER — Encounter: Payer: Medicare Other | Admitting: Pain Medicine

## 2016-03-15 ENCOUNTER — Encounter: Payer: Self-pay | Admitting: Pain Medicine

## 2016-03-15 ENCOUNTER — Ambulatory Visit: Payer: Medicare Other | Attending: Pain Medicine | Admitting: Pain Medicine

## 2016-03-15 VITALS — BP 160/73 | HR 87 | Temp 98.7°F | Resp 16 | Ht 60.0 in | Wt 203.0 lb

## 2016-03-15 DIAGNOSIS — F1190 Opioid use, unspecified, uncomplicated: Secondary | ICD-10-CM

## 2016-03-15 DIAGNOSIS — F329 Major depressive disorder, single episode, unspecified: Secondary | ICD-10-CM | POA: Insufficient documentation

## 2016-03-15 DIAGNOSIS — Z5181 Encounter for therapeutic drug level monitoring: Secondary | ICD-10-CM | POA: Diagnosis not present

## 2016-03-15 DIAGNOSIS — M533 Sacrococcygeal disorders, not elsewhere classified: Secondary | ICD-10-CM | POA: Insufficient documentation

## 2016-03-15 DIAGNOSIS — G8929 Other chronic pain: Secondary | ICD-10-CM | POA: Diagnosis not present

## 2016-03-15 DIAGNOSIS — Z9884 Bariatric surgery status: Secondary | ICD-10-CM | POA: Insufficient documentation

## 2016-03-15 DIAGNOSIS — Z79891 Long term (current) use of opiate analgesic: Secondary | ICD-10-CM | POA: Insufficient documentation

## 2016-03-15 DIAGNOSIS — K219 Gastro-esophageal reflux disease without esophagitis: Secondary | ICD-10-CM | POA: Insufficient documentation

## 2016-03-15 DIAGNOSIS — Z79899 Other long term (current) drug therapy: Secondary | ICD-10-CM

## 2016-03-15 DIAGNOSIS — I10 Essential (primary) hypertension: Secondary | ICD-10-CM | POA: Insufficient documentation

## 2016-03-15 DIAGNOSIS — M25559 Pain in unspecified hip: Secondary | ICD-10-CM | POA: Diagnosis present

## 2016-03-15 DIAGNOSIS — E7800 Pure hypercholesterolemia, unspecified: Secondary | ICD-10-CM | POA: Insufficient documentation

## 2016-03-15 DIAGNOSIS — E119 Type 2 diabetes mellitus without complications: Secondary | ICD-10-CM | POA: Diagnosis not present

## 2016-03-15 DIAGNOSIS — Z9049 Acquired absence of other specified parts of digestive tract: Secondary | ICD-10-CM | POA: Insufficient documentation

## 2016-03-15 DIAGNOSIS — J449 Chronic obstructive pulmonary disease, unspecified: Secondary | ICD-10-CM | POA: Insufficient documentation

## 2016-03-15 DIAGNOSIS — M79643 Pain in unspecified hand: Secondary | ICD-10-CM | POA: Diagnosis present

## 2016-03-15 DIAGNOSIS — M25551 Pain in right hip: Secondary | ICD-10-CM | POA: Insufficient documentation

## 2016-03-15 DIAGNOSIS — K5903 Drug induced constipation: Secondary | ICD-10-CM | POA: Insufficient documentation

## 2016-03-15 DIAGNOSIS — M47816 Spondylosis without myelopathy or radiculopathy, lumbar region: Secondary | ICD-10-CM | POA: Diagnosis not present

## 2016-03-15 DIAGNOSIS — N993 Prolapse of vaginal vault after hysterectomy: Secondary | ICD-10-CM | POA: Insufficient documentation

## 2016-03-15 DIAGNOSIS — T402X5A Adverse effect of other opioids, initial encounter: Secondary | ICD-10-CM

## 2016-03-15 DIAGNOSIS — M069 Rheumatoid arthritis, unspecified: Secondary | ICD-10-CM | POA: Diagnosis not present

## 2016-03-15 MED ORDER — LUBIPROSTONE 24 MCG PO CAPS: 24.0000 ug | ORAL_CAPSULE | Freq: Two times a day (BID) | ORAL | Status: DC

## 2016-03-15 MED ORDER — BENEFIBER PO POWD: ORAL | Status: DC

## 2016-03-15 MED ORDER — HYDROCODONE-ACETAMINOPHEN 10-325 MG PO TABS: 1.0000 | ORAL_TABLET | Freq: Three times a day (TID) | ORAL | Status: DC | PRN

## 2016-03-15 NOTE — Patient Instructions (Signed)

## 2016-03-15 NOTE — Progress Notes (Signed)
Patient's Name: Mariah Poole  Patient type: Established  MRN: [id number]  Service setting: Ambulatory outpatient  DOB: 25-Feb-1946  Location: [facility]  DOS: 03/15/2016  Primary Care Physician: Mariah Sato, MD  Note by: Mariah Poole. Laban Emperor, M.D, DABA, DABAPM, DABPM, DABIPP, FIPP  Referring Physician: Leanna Sato, MD  Specialty: Board-Certified Interventional Pain Management     Primary Reason(s) for Visit: Encounter for prescription drug management (Level of risk: moderate) CC: Hip Pain and Hand Pain   HPI  [name] is a 70 y.o. year old, female patient, who returns today as an established patient. She has Encounter for therapeutic drug level monitoring; Long term current use of opiate analgesic; Opiate use (30 MME/Day); Chronic pain; Chronic pain syndrome; Chronic right hip pain; Opiate analgesic use agreement exists; Sacral pain; Morbid obesity (HCC); History of carpal tunnel syndrome, right side; Rheumatoid arthritis (HCC); Depression; Opioid-induced constipation; History of contrast media allergy. (IVP dye); Diabetes mellitus (HCC); BP (high blood pressure); Other specified health status; Bariatric surgery status; History of cholecystectomy; Lumbar spondylosis; Long term prescription opiate use; Allergic to radiographic contrast media; Constipation due to opioid therapy; Administrative encounter; Arthralgia of hip; H/O disease; Degenerative arthritis of lumbar spine; Prolapse of vaginal vault after hysterectomy; and Hieralgia on her problem list.. Her primarily concern today is the Hip Pain and Hand Pain   Pain Assessment: Self-Reported Pain Score: 8 , clinically she looks like a 2-3/10. Reported level is inconsistent with clinical obrservations Pain Descriptors / Indicators: Throbbing, Cramping Pain Frequency: Intermittent  The patient comes into the clinics today for pharmacological management of her chronic pain.The patient's last UDS on 12/13/2015  wasn't normal but she had a good explanation for this due to the fact that she was admitted in the hospital due to similar medical problems and she was given some additional pain medication. They attempted time to explain to the patient what her responsibilities are whenever she gets some additional pain medicine. She understood and accepted.  Date of Last Visit: 12/13/15 Service Provided on Last Visit: Med Refill, Evaluation  Controlled Substance Pharmacotherapy Assessment & REMS (Risk Evaluation and Mitigation Strategy)  Analgesic: Hydrocodone/APAP 10/325 one every 8 hours PRN (30 mg/day) Pill Count: The patient did not bring any of her medications since she ran out that she also did not bring her empty bottles.  MME/day: 30 mg/day Date of Last Visit: 12/13/15 Pharmacokinetics: Onset of action (Liberation/Absorption): Within expected pharmacological parameters Time to Peak effect (Distribution): Timing and results are as within normal expected parameters Duration of action (Metabolism/Excretion): Within normal limits for medication Pharmacodynamics: Analgesic Effect: More than 50% Activity Facilitation: Medication(s) allow patient to sit, stand, walk, and do the basic ADLs Perceived Effectiveness: Described as relatively effective, allowing for increase in activities of daily living (ADL) Side-effects or Adverse reactions: None reported Monitoring: [place] PMP: Online review of the past 32-month period conducted. Compliant with practice rules and regulations UDS Results/interpretation: Last UDS done on 12/13/2015 him back with unexpected results. This sample came back positive for oxycodone and hydrocodone. The patient actually takes hydrocodone but is not supposed to be taking oxycodone. Today we had a conversation with regards to this. This was explained by hospital admission. Medication Assessment Form: Reviewed. Patient indicates being compliant with therapy Treatment compliance: Deficiencies  noted and steps taken to remind the patient of the seriousness of adequate therapy compliance Risk Assessment: Aberrant Behavior: None observed today Substance Use Disorder (SUD) Risk Level: Moderate-to-high Risk of opioid abuse or dependence:  0.7-3.0% with doses ? 36 MME/day and 6.1-26% with doses ? 120 MME/day. Opioid Risk Tool (ORT) Score: Total Score: 10 High Risk for Opioid Abuse (Score >8) Depression Scale Score: PHQ-2: PHQ-2 Total Score: 0 No depression (0) PHQ-9: PHQ-9 Total Score: 0 No depression (0-4)  Pharmacologic Plan: No change in therapy, at this time  Laboratory Chemistry  Inflammation Markers Lab Results  Component Value Date   ESRSEDRATE 37* 01/06/2013    Renal Function Lab Results  Component Value Date   BUN 23* 10/27/2015   CREATININE 0.93 10/27/2015   GFRAA >60 10/27/2015   GFRNONAA >60 10/27/2015    Hepatic Function Lab Results  Component Value Date   AST 20 10/27/2015   ALT 16 10/27/2015   ALBUMIN 4.0 10/27/2015    Electrolytes Lab Results  Component Value Date   NA 138 10/27/2015   K 3.3* 10/27/2015   CL 95* 10/27/2015   CALCIUM 9.5 10/27/2015   MG 1.4* 12/16/2012    Pain Modulating Vitamins No results found for: VD25OH, VD125OH2TOT, YT0160FU9, NA3557DU2, VITAMINB12  Coagulation Parameters Lab Results  Component Value Date   INR 0.94 11/23/2009   LABPROT 12.5 11/23/2009    Note: I personally reviewed the above data. Results shared with patient.  Meds  The patient has a current medication list which includes the following prescription(s): albuterol, vitamin c, atenolol, vitamin d3, duloxetine, furosemide, glipizide, hydrochlorothiazide, hydrocodone-acetaminophen, hydrocodone-acetaminophen, hydrocodone-acetaminophen, losartan, lubiprostone, magnesium oxide, magnesium oxide, metformin, multi-vitamins, naproxen, pantoprazole, potassium chloride sa, pravastatin, turmeric curcumin, and benefiber.  Current Outpatient Prescriptions on File  Prior to Visit  Medication Sig  . albuterol (PROVENTIL HFA;VENTOLIN HFA) 108 (90 BASE) MCG/ACT inhaler Inhale 2 puffs into the lungs every 4 (four) hours as needed for wheezing or shortness of breath.  . Ascorbic Acid (VITAMIN C) 1000 MG tablet Take 1,000 mg by mouth daily.  Marland Kitchen atenolol (TENORMIN) 25 MG tablet Take 25 mg by mouth daily.   . Cholecalciferol (VITAMIN D3) 2000 UNITS TABS Take 1 tablet by mouth daily.  . DULoxetine (CYMBALTA) 30 MG capsule Take 30 mg by mouth 2 (two) times daily.  . furosemide (LASIX) 20 MG tablet Take 20 mg by mouth daily.  Marland Kitchen glipiZIDE (GLUCOTROL XL) 5 MG 24 hr tablet Take 5 mg by mouth daily with breakfast.  . hydrochlorothiazide (HYDRODIURIL) 25 MG tablet Take 25 mg by mouth daily.  Marland Kitchen losartan (COZAAR) 25 MG tablet Take 25 mg by mouth daily.  . magnesium oxide (MAG-OX) 400 MG tablet Take 400 mg by mouth daily.  . metFORMIN (GLUCOPHAGE-XR) 500 MG 24 hr tablet Take 1,000 mg by mouth.  . Multiple Vitamin (MULTI-VITAMINS) TABS Take by mouth.  . pantoprazole (PROTONIX) 40 MG tablet Take 40 mg by mouth daily.  . potassium chloride SA (K-DUR,KLOR-CON) 20 MEQ tablet Take 20 mEq by mouth daily.   . pravastatin (PRAVACHOL) 20 MG tablet Take 20 mg by mouth at bedtime. Reported on 12/13/2015   No current facility-administered medications on file prior to visit.    ROS  Constitutional: Afebrile, no chills, well hydrated and well nourished Gastrointestinal: No upper or lower GI bleeding, no nausea, no vomiting and no acute GI distress Musculoskeletal: No acute joint swelling or redness, no acute loss of range of motion and no acute onset weakness Neurological: Denies any acute onset apraxia, no episodes of paralysis, no acute loss of coordination, no acute loss of consciousness and no acute onset aphasia, dysarthria, agnosia, or amnesia  Allergies  Ms. Councilman is allergic to contrast media.  PFSH  Medical:  Ms. Griner  has a past medical history of Shortness of  breath dyspnea; Hypertension; History of hiatal hernia; GERD (gastroesophageal reflux disease); Arthritis; Depression; Diabetes mellitus without complication (HCC); Hypercholesteremia; COPD (chronic obstructive pulmonary disease) (HCC); Hiatal hernia; Scoliosis; Anemia; Morbid obesity (HCC); and History of contrast media allergy. (IVP dye) (09/01/2015). Family: family history includes Diabetes in her mother; Heart disease in her father. Surgical:  has past surgical history that includes Abdominal hysterectomy; Cataract extraction w/ intraocular lens implant (Right); Gastric bypass; Hernia repair; Cataract extraction w/PHACO (Left, 07/19/2015); and Cholecystectomy. Tobacco:  reports that she has never smoked. She does not have any smokeless tobacco history on file. Alcohol:  reports that she does not drink alcohol. Drug:  has no drug history on file.  Physical Examination  Constitutional Vitals: Blood pressure 160/73, pulse 87, temperature 98.7 F (37.1 C), temperature source Oral, resp. rate 16, height 5' (1.524 m), weight 203 lb (92.08 kg), SpO2 100 %. Calculated BMI: Body mass index is 39.65 kg/(m^2). (35-39.9 kg/m2) Severe obesity (Class II) - 136% higher incidence of chronic pain General appearance: Alert, cooperative, oriented x 3, in no acute distress, well nourished, well developed, well hydrated Eyes: PERLA Respiratory: No evidence respiratory distress, no audible rales or ronchi and no use of accessory muscles of respiration Psych: Alert, oriented to person, oriented to place and oriented to time  Cervical Spine Exam  Inspection: Normal anatomy, no anomalies observed Cervical Lordosis: Normal Alignment: Symetrical Functional ROM: Within functional limits (WFL) AROM: WFL Sensory: No sensory anomalies reported or detected  Upper Extremity Exam    Right  Left  Inspection: No gross anomalies detected  Inspection: No gross anomalies detected  Functional ROM: Adequate  Functional ROM:  Adequate  AROM: Adequate  AROM: Adequate  Sensory: No sensory anomalies reported or detected  Sensory: No sensory anomalies reported or detected  Motor: Unremarkable  Motor: Unremarkable  Vascular: Normal skin color, temperature, and hair growth. No peripheral edema or cyanosis  Vascular: Normal skin color, temperature, and hair growth. No peripheral edema or cyanosis   Thoracic Spine  Inspection: No gross anomalies detected Alignment: Symetrical Functional ROM: Within functional limits [facility]) AROM: Adequate Palpation: WNL  Lumbar Spine  Inspection: Scoliosis Alignment: Asymetric Functional ROM: Within functional limits (WFL) AROM: Adequate Sensory: No sensory anomalies reported or detected Palpation: Positive Provocative Tests: Lumbar Hyperextension and rotation test: Positive for bilateral lumbar facet pain Patrick's Maneuver: deferred  Gait Assessment  Gait: Antalgic gait & posture  Lower Extremities    Right  Left  Inspection: No gross anomalies detected  Inspection: No gross anomalies detected  Functional ROM: Within functional limits [facility])  Functional ROM: Within functional limits (WFL)  AROM: Adequate  AROM: Adequate  Sensory: No sensory anomalies reported or detected  Sensory: No sensory anomalies reported or detected  Motor: Unremarkable  Motor: Unremarkable  Toe walk (S1): WNL  Toe walk (S1): WNL  Heal walk (L5): WNL  Heal walk (L5): WNL   Assessment & Plan  Primary Diagnosis & Pertinent Problem List: The primary encounter diagnosis was Chronic pain. Diagnoses of Encounter for therapeutic drug level monitoring, Long term current use of opiate analgesic, Long term prescription opiate use, Opioid-induced constipation, and Opiate use (30 MME/Day) were also pertinent to this visit.  Visit Diagnosis: 1. Chronic pain   2. Encounter for therapeutic drug level monitoring   3. Long term current use of opiate analgesic   4. Long term prescription opiate use   5.  Opioid-induced  constipation   6. Opiate use (30 MME/Day)     Problems updated and reviewed during this visit: Problem  Degenerative Arthritis of Lumbar Spine  Chronic Pain  Chronic Pain Syndrome  Rheumatoid Arthritis (Hcc)  Arthralgia of Hip  Long Term Prescription Opiate Use  Encounter for Therapeutic Drug Level Monitoring  Long Term Current Use of Opiate Analgesic  Opiate use (30 MME/Day)   Hydrocodone/APAP 10/325 one every 8 hours PRN (30 mg/day)   Other Specified Health Status  History of Cholecystectomy  Morbid Obesity (Hcc)  Depression  Allergic to Radiographic Contrast Media  Constipation Due to Opioid Therapy  Administrative Encounter  H/O Disease  Hieralgia  Prolapse of Vaginal Vault After Hysterectomy    Problem-specific Plan(s): No problem-specific assessment & plan notes found for this encounter.  No new assessment & plan notes have been filed under this hospital service since the last note was generated. Service: Pain Management   Plan of Care   Problem List Items Addressed This Visit      High   Chronic pain - Primary (Chronic)   Relevant Medications   naproxen (NAPROSYN) 250 MG tablet   HYDROcodone-acetaminophen (NORCO) 10-325 MG tablet   HYDROcodone-acetaminophen (NORCO) 10-325 MG tablet   HYDROcodone-acetaminophen (NORCO) 10-325 MG tablet     Medium   Encounter for therapeutic drug level monitoring   Long term current use of opiate analgesic (Chronic)   Relevant Orders   ToxASSURE Select 13 (MW), Urine   Long term prescription opiate use (Chronic)   Opiate use (30 MME/Day) (Chronic)   Opioid-induced constipation   Relevant Medications   lubiprostone (AMITIZA) 24 MCG capsule   Wheat Dextrin (BENEFIBER) POWD       Pharmacotherapy (Medications Ordered): Meds ordered this encounter  Medications  . HYDROcodone-acetaminophen (NORCO) 10-325 MG tablet    Sig: Take 1 tablet by mouth every 8 (eight) hours as needed for moderate pain or severe  pain.    Dispense:  90 tablet    Refill:  0    Do not place this medication, or any other prescription from our practice, on "Automatic Refill". Patient may have prescription filled one day early if pharmacy is closed on scheduled refill date. Do not fill until: 03/15/16 To last until: 04/14/16  . HYDROcodone-acetaminophen (NORCO) 10-325 MG tablet    Sig: Take 1 tablet by mouth every 8 (eight) hours as needed for moderate pain or severe pain.    Dispense:  90 tablet    Refill:  0    Do not place this medication, or any other prescription from our practice, on "Automatic Refill". Patient may have prescription filled one day early if pharmacy is closed on scheduled refill date. Do not fill until: 04/14/16 To last until: 05/14/16  . HYDROcodone-acetaminophen (NORCO) 10-325 MG tablet    Sig: Take 1 tablet by mouth every 8 (eight) hours as needed for moderate pain or severe pain.    Dispense:  90 tablet    Refill:  0    Do not place this medication, or any other prescription from our practice, on "Automatic Refill". Patient may have prescription filled one day early if pharmacy is closed on scheduled refill date. Do not fill until: 05/14/16 To last until: 06/13/16  . lubiprostone (AMITIZA) 24 MCG capsule    Sig: Take 1 capsule (24 mcg total) by mouth 2 (two) times daily with a meal. Swallow the medication whole. Do not break or chew the medication.    Dispense:  60 capsule  Refill:  2    Do not place this medication, or any other prescription from our practice, on "Automatic Refill". Patient may have prescription filled one day early if pharmacy is closed on scheduled refill date.  . Wheat Dextrin (BENEFIBER) POWD    Sig: Stir 2 tsp into 4-8 oz of any non-carbonated beverage or soft food (hot or cold). Stir well until dissolved. Take three times daily    Dispense:  500 g    Refill:  PRN    This is an OTC product. The prescription is a patient reminder to use this particular product.     Lab-work & Procedure Ordered: Orders Placed This Encounter  Procedures  . ToxASSURE Select 13 (MW), Urine    Imaging Ordered: None  Interventional Therapies: Scheduled:  None at this time.    Considering:  None at this time.    PRN Procedures:  None at this time.    Referral(s) or Consult(s): None at this time.  New Prescriptions   No medications on file    Medications administered during this visit: Ms. Liff had no medications administered during this visit.  Future Appointments Date Time Provider Department Center  05/09/2016 8:20 AM Delano Metz, MD [facility] None    Primary Care Physician: Mariah Sato, MD Location: [facility] Note by: Mariah Poole. Laban Emperor, M.D, DABA, DABAPM, DABPM, DABIPP, FIPP  Pain Score Disclaimer: We use the NRS-11 scale. This is a self-reported, subjective measurement of pain severity with only modest accuracy. It is used primarily to identify changes within a particular patient. It must be understood that outpatient pain scales are significantly less accurate that those used for research, where they can be applied under ideal controlled circumstances with minimal exposure to variables. In reality, the score is likely to be a combination of pain intensity and pain affect, where pain affect describes the degree of emotional arousal or changes in action readiness caused by the sensory experience of pain. Factors such as social and work situation, setting, emotional state, anxiety levels, expectation, and prior pain experience may influence pain perception and show large inter-individual differences that may also be affected by time variables.

## 2016-03-15 NOTE — Progress Notes (Signed)
Safety precautions to be maintained throughout the outpatient stay will include: orient to surroundings, keep bed in low position, maintain call bell within reach at all times, provide assistance with transfer out of bed and ambulation.  

## 2016-03-21 LAB — TOXASSURE SELECT 13 (MW), URINE

## 2016-03-25 NOTE — Progress Notes (Signed)
Quick Note:  NOTE: This forensic urine drug screen (UDS) test was conducted using a state-of-the-art ultra high performance liquid chromatography and mass spectrometry system (UPLC/MS-MS), the most sophisticated and accurate method available. UPLC/MS-MS is 1,000 times more precise and accurate than standard gas chromatography and mass spectrometry (GC/MS). This system can analyze 26 drug categories and 180 drug compounds.  The findings of this UDT were reported as abnormal due to inconsistencies with expected results. An expected substance was not present in the sample. Expectations were based on the medication history provided by the patient at the time of sample collection. These results are concerning due to the possibility of opioid diversion, or non-compliance with instructions on how to take the medication, including increase intake of medication early in the prescription refill, possibly running out towards the end of the prescribed period. ______ 

## 2016-05-09 ENCOUNTER — Encounter: Payer: Self-pay | Admitting: Pain Medicine

## 2016-05-09 ENCOUNTER — Ambulatory Visit: Payer: Medicare Other | Attending: Pain Medicine | Admitting: Pain Medicine

## 2016-05-09 VITALS — BP 104/56 | HR 87 | Temp 97.0°F | Resp 20 | Ht 60.0 in | Wt 202.0 lb

## 2016-05-09 DIAGNOSIS — K5903 Drug induced constipation: Secondary | ICD-10-CM | POA: Insufficient documentation

## 2016-05-09 DIAGNOSIS — M419 Scoliosis, unspecified: Secondary | ICD-10-CM | POA: Diagnosis not present

## 2016-05-09 DIAGNOSIS — I10 Essential (primary) hypertension: Secondary | ICD-10-CM | POA: Insufficient documentation

## 2016-05-09 DIAGNOSIS — N993 Prolapse of vaginal vault after hysterectomy: Secondary | ICD-10-CM | POA: Insufficient documentation

## 2016-05-09 DIAGNOSIS — M25551 Pain in right hip: Secondary | ICD-10-CM | POA: Diagnosis not present

## 2016-05-09 DIAGNOSIS — D649 Anemia, unspecified: Secondary | ICD-10-CM | POA: Insufficient documentation

## 2016-05-09 DIAGNOSIS — J449 Chronic obstructive pulmonary disease, unspecified: Secondary | ICD-10-CM | POA: Diagnosis not present

## 2016-05-09 DIAGNOSIS — E7800 Pure hypercholesterolemia, unspecified: Secondary | ICD-10-CM | POA: Diagnosis not present

## 2016-05-09 DIAGNOSIS — F329 Major depressive disorder, single episode, unspecified: Secondary | ICD-10-CM | POA: Diagnosis not present

## 2016-05-09 DIAGNOSIS — K219 Gastro-esophageal reflux disease without esophagitis: Secondary | ICD-10-CM | POA: Diagnosis not present

## 2016-05-09 DIAGNOSIS — Z6839 Body mass index (BMI) 39.0-39.9, adult: Secondary | ICD-10-CM | POA: Diagnosis not present

## 2016-05-09 DIAGNOSIS — M47816 Spondylosis without myelopathy or radiculopathy, lumbar region: Secondary | ICD-10-CM | POA: Insufficient documentation

## 2016-05-09 DIAGNOSIS — Z9884 Bariatric surgery status: Secondary | ICD-10-CM | POA: Diagnosis not present

## 2016-05-09 DIAGNOSIS — Z79891 Long term (current) use of opiate analgesic: Secondary | ICD-10-CM | POA: Diagnosis not present

## 2016-05-09 DIAGNOSIS — G5601 Carpal tunnel syndrome, right upper limb: Secondary | ICD-10-CM | POA: Insufficient documentation

## 2016-05-09 DIAGNOSIS — M549 Dorsalgia, unspecified: Secondary | ICD-10-CM | POA: Diagnosis present

## 2016-05-09 DIAGNOSIS — G8929 Other chronic pain: Secondary | ICD-10-CM | POA: Insufficient documentation

## 2016-05-09 DIAGNOSIS — E119 Type 2 diabetes mellitus without complications: Secondary | ICD-10-CM | POA: Insufficient documentation

## 2016-05-09 DIAGNOSIS — Z9049 Acquired absence of other specified parts of digestive tract: Secondary | ICD-10-CM | POA: Diagnosis not present

## 2016-05-09 DIAGNOSIS — K449 Diaphragmatic hernia without obstruction or gangrene: Secondary | ICD-10-CM | POA: Diagnosis not present

## 2016-05-09 DIAGNOSIS — M069 Rheumatoid arthritis, unspecified: Secondary | ICD-10-CM | POA: Insufficient documentation

## 2016-05-09 MED ORDER — HYDROCODONE-ACETAMINOPHEN 10-325 MG PO TABS: 1.0000 | ORAL_TABLET | Freq: Three times a day (TID) | ORAL | Status: DC | PRN

## 2016-05-09 NOTE — Progress Notes (Signed)
Patient's Name: Mariah Poole  Patient type: Established  MRN: [id number]  Service setting: Ambulatory outpatient  DOB: 1945/12/10  Location: [facility]  DOS: 05/09/2016  Primary Care Physician: Leanna Sato, MD  Note by: Sydnee Levans. Laban Emperor, M.D, DABA, DABAPM, DABPM, Olga Coaster, FIPP  Referring Physician: Leanna Sato, MD  Specialty: Board-Certified Interventional Pain Management  Last Visit to Pain Management: 03/15/2016   Primary Reason(s) for Visit: Encounter for prescription drug management (Level of risk: moderate) CC: Back Pain   HPI  Mariah Poole is a 70 y.o. year old, female patient, who returns today as an established patient. She has Encounter for therapeutic drug level monitoring; Long term current use of opiate analgesic; Opiate use (30 MME/Day); Chronic pain; Chronic pain syndrome; Chronic right hip pain; Opiate analgesic use agreement exists; Sacral pain; Morbid obesity (HCC); History of carpal tunnel syndrome, right side; Rheumatoid arthritis (HCC); Depression; Opioid-induced constipation; History of contrast media allergy. (IVP dye); Diabetes mellitus (HCC); BP (high blood pressure); Other specified health status; Bariatric surgery status; History of cholecystectomy; Lumbar spondylosis; Long term prescription opiate use; Allergic to radiographic contrast media; Constipation due to opioid therapy; Administrative encounter; Arthralgia of hip; H/O disease; Degenerative arthritis of lumbar spine; Prolapse of vaginal vault after hysterectomy; and Hieralgia on her problem list.. Her primarily concern today is the Back Pain   Pain Assessment: Self-Reported Pain Score: 8 , clinically she looks like a 3-4/10 Reported level is inconsistent with clinical obrservations Information on the proper use of the pain score provided to the patient today. Pain Type: Chronic pain Pain Location: Buttocks (right buttock) Pain Orientation: Right Pain Descriptors / Indicators:  Aching, Throbbing Pain Frequency: Intermittent  The patient comes into the clinics today for pharmacological management of her chronic pain. I last saw this patient on 03/15/2016. The patient  has no drug history on file. Her body mass index is 39.45 kg/(m^2).   Surgery next month by Dr. Corliss Parish (ERCP stent removal). May be witting for some pain meds.  Date of Last Visit: 03/15/16 Service Provided on Last Visit: Med Refill  Controlled Substance Pharmacotherapy Assessment & REMS (Risk Evaluation and Mitigation Strategy)  Analgesic: Hydrocodone/APAP 10/325 one every 8 hours PRN (30 mg/day) Pill Count: Hydrocodone pill count # 23/90 Filled 04-14-16 MME/day: 30 mg/day Pharmacokinetics: Onset of action (Liberation/Absorption): Within expected pharmacological parameters Time to Peak effect (Distribution): Timing and results are as within normal expected parameters Duration of action (Metabolism/Excretion): Within normal limits for medication Pharmacodynamics: Analgesic Effect: More than 50% Activity Facilitation: Medication(s) allow patient to sit, stand, walk, and do the basic ADLs Perceived Effectiveness: Described as relatively effective, allowing for increase in activities of daily living (ADL) Side-effects or Adverse reactions: None reported Monitoring: [place] PMP: Online review of the past 50-month period conducted. Compliant with practice rules and regulations Last UDS on record: TOXASSURE SELECT 13  Date Value Ref Range Status  03/15/2016 FINAL  Final    Comment:    ==================================================================== TOXASSURE SELECT 13 (MW) ==================================================================== Test                             Result       Flag       Units Drug Absent but Declared for Prescription Verification   Hydrocodone                    Not Detected UNEXPECTED ng/mg  creat ==================================================================== Test  Result    Flag   Units      Ref Range   Creatinine              208              mg/dL      >=16 ==================================================================== Declared Medications:  The flagging and interpretation on this report are based on the  following declared medications.  Unexpected results may arise from  inaccuracies in the declared medications.  **Note: The testing scope of this panel includes these medications:  Hydrocodone (Norco)  **Note: The testing scope of this panel does not include following  reported medications:  Acetaminophen (Norco)  Albuterol  Atenolol (Tenormin)  Cholecalciferol  Duloxetine (Cymbalta)  Furosemide (Lasix)  Glipizide  Hydrochlorothiazide  Losartan (Cozaar)  Lubiprostone (Amitiza)  Magnesium (Mag-Ox)  Metformin (Glucophage)  Multivitamin  Naproxen  Pantoprazole (Protonix)  Potassium (Klor-Con)  Pravastatin (Pravachol)  Supplement  Turmeric  Vitamin C ==================================================================== For clinical consultation, please call [phone]. ====================================================================    UDS interpretation: Compliant This may be an issue with the scheduling as opposed to taking more medication than prescribed. We will make an effort to schedule the return appointment before the patient runs out of medication. Medication Assessment Form: Reviewed. Patient indicates being compliant with therapy Treatment compliance: Compliant Risk Assessment: Aberrant Behavior: None observed today Substance Use Disorder (SUD) Risk Level: Moderate Risk of opioid abuse or dependence: 0.7-3.0% with doses ? 36 MME/day and 6.1-26% with doses ? 120 MME/day. Opioid Risk Tool (ORT) Score:  10 High Risk for Opioid Abuse (Score >8) Depression Scale Score: PHQ-2: PHQ-2 Total Score: 0 No depression  (0) PHQ-9: PHQ-9 Total Score: 0 No depression (0-4)  Pharmacologic Plan: No change in therapy, at this time  Laboratory Chemistry  Inflammation Markers Lab Results  Component Value Date   ESRSEDRATE 37* 01/06/2013    Renal Function Lab Results  Component Value Date   BUN 23* 10/27/2015   CREATININE 0.93 10/27/2015   GFRAA >60 10/27/2015   GFRNONAA >60 10/27/2015    Hepatic Function Lab Results  Component Value Date   AST 20 10/27/2015   ALT 16 10/27/2015   ALBUMIN 4.0 10/27/2015    Electrolytes Lab Results  Component Value Date   NA 138 10/27/2015   K 3.3* 10/27/2015   CL 95* 10/27/2015   CALCIUM 9.5 10/27/2015   MG 1.4* 12/16/2012    Pain Modulating Vitamins No results found for: VD25OH, VD125OH2TOT, WJ1914NW2, NF6213YQ6, VITAMINB12  Coagulation Parameters Lab Results  Component Value Date   INR 0.94 11/23/2009   LABPROT 12.5 11/23/2009   APTT 20* 11/23/2009   PLT 346 10/27/2015    Note: Labs Reviewed.  Recent Diagnostic Imaging  Dg Chest 2 View  10/27/2015  CLINICAL DATA:  Chest pain EXAM: CHEST  2 VIEW COMPARISON:  12/16/2012 FINDINGS: There is no edema, consolidation, effusion, or pneumothorax. Normal heart size and stable aortic tortuosity. Moderate hiatal hernia with gastric surgical clips. IMPRESSION: 1. No acute finding. 2. Moderate hiatal hernia. Electronically Signed   By: Marnee Spring M.D.   On: 10/27/2015 15:21   Meds  The patient has a current medication list which includes the following prescription(s): albuterol, vitamin c, atenolol, vitamin d3, duloxetine, furosemide, glipizide, hydrochlorothiazide, hydrocodone-acetaminophen, hydrocodone-acetaminophen, hydrocodone-acetaminophen, losartan, magnesium oxide, magnesium oxide, metformin, multi-vitamins, naproxen, pantoprazole, polyethylene glycol-electrolytes, potassium chloride sa, pravastatin, turmeric curcumin, benefiber, and lubiprostone.  Current Outpatient Prescriptions on File Prior to  Visit  Medication Sig  . albuterol (PROVENTIL HFA;VENTOLIN HFA)  108 (90 BASE) MCG/ACT inhaler Inhale 2 puffs into the lungs every 4 (four) hours as needed for wheezing or shortness of breath.  . Ascorbic Acid (VITAMIN C) 1000 MG tablet Take 1,000 mg by mouth daily.  Marland Kitchen atenolol (TENORMIN) 25 MG tablet Take 25 mg by mouth daily.   . Cholecalciferol (VITAMIN D3) 2000 UNITS TABS Take 1 tablet by mouth daily.  . DULoxetine (CYMBALTA) 30 MG capsule Take 30 mg by mouth 2 (two) times daily.  . furosemide (LASIX) 20 MG tablet Take 20 mg by mouth daily.  Marland Kitchen glipiZIDE (GLUCOTROL XL) 5 MG 24 hr tablet Take 5 mg by mouth daily with breakfast.  . hydrochlorothiazide (HYDRODIURIL) 25 MG tablet Take 25 mg by mouth daily.  Marland Kitchen losartan (COZAAR) 25 MG tablet Take 25 mg by mouth daily.  . magnesium oxide (MAG-OX) 400 (241.3 Mg) MG tablet TAKE 1 BY MOUTH ONCE A DAY, DO NOT TAKE WHILE TAKING ANY ANTIBIOTICS DR. Estelle June [phone]  . magnesium oxide (MAG-OX) 400 MG tablet Take 400 mg by mouth daily.  . metFORMIN (GLUCOPHAGE-XR) 500 MG 24 hr tablet Take 1,000 mg by mouth.  . Multiple Vitamin (MULTI-VITAMINS) TABS Take by mouth.  . naproxen (NAPROSYN) 250 MG tablet Take 250 mg by mouth.  . pantoprazole (PROTONIX) 40 MG tablet Take 40 mg by mouth daily.  . potassium chloride SA (K-DUR,KLOR-CON) 20 MEQ tablet Take 20 mEq by mouth daily.   . pravastatin (PRAVACHOL) 20 MG tablet Take 20 mg by mouth at bedtime. Reported on 12/13/2015  . Turmeric Curcumin 500 MG CAPS Take by mouth.  . Wheat Dextrin (BENEFIBER) POWD Stir 2 tsp into 4-8 oz of any non-carbonated beverage or soft food (hot or cold). Stir well until dissolved. Take three times daily  . lubiprostone (AMITIZA) 24 MCG capsule Take 1 capsule (24 mcg total) by mouth 2 (two) times daily with a meal. Swallow the medication whole. Do not break or chew the medication. (Patient not taking: Reported on 05/09/2016)   No current facility-administered medications on file  prior to visit.    ROS  Constitutional: Denies any fever or chills Gastrointestinal: No reported hemesis, hematochezia, vomiting, or acute GI distress Musculoskeletal: Denies any acute onset joint swelling, redness, loss of ROM, or weakness Neurological: No reported episodes of acute onset apraxia, aphasia, dysarthria, agnosia, amnesia, paralysis, loss of coordination, or loss of consciousness  Allergies  Ms. Bencomo is allergic to contrast media.  PFSH  Medical:  Ms. Matsushima  has a past medical history of Shortness of breath dyspnea; Hypertension; History of hiatal hernia; GERD (gastroesophageal reflux disease); Arthritis; Depression; Diabetes mellitus without complication (HCC); Hypercholesteremia; COPD (chronic obstructive pulmonary disease) (HCC); Hiatal hernia; Scoliosis; Anemia; Morbid obesity (HCC); and History of contrast media allergy. (IVP dye) (09/01/2015). Family: family history includes Diabetes in her mother; Heart disease in her father. Surgical:  has past surgical history that includes Abdominal hysterectomy; Cataract extraction w/ intraocular lens implant (Right); Gastric bypass; Hernia repair; Cataract extraction w/PHACO (Left, 07/19/2015); and Cholecystectomy. Tobacco:  reports that she has never smoked. She does not have any smokeless tobacco history on file. Alcohol:  reports that she does not drink alcohol. Drug:  has no drug history on file.  Constitutional Exam  Vitals: Blood pressure 104/56, pulse 87, temperature 97 F (36.1 C), resp. rate 20, height 5' (1.524 m), weight 202 lb (91.627 kg), SpO2 100 %. General appearance: Well nourished, well developed, and well hydrated. In no acute distress Calculated BMI/Body habitus: Body mass index is  39.45 kg/(m^2). (35-39.9 kg/m2) Severe obesity (Class II) - 136% higher incidence of chronic pain Psych/Mental status: Alert and oriented x 3 (person, place, & time) Eyes: PERLA Respiratory: No evidence of acute respiratory  distress  Cervical Spine Exam  Inspection: No masses, redness, or swelling Alignment: Symmetrical ROM: Functional: ROM is within functional limits [facility]) Stability: No instability detected Muscle strength & Tone: Functionally intact Sensory: Unimpaired Palpation: No complaints of tenderness  Upper Extremity (UE) Exam    Side: Right upper extremity  Side: Left upper extremity  Inspection: No masses, redness, swelling, or asymmetry  Inspection: No masses, redness, swelling, or asymmetry  ROM:  ROM:  Functional: ROM is within functional limits [facility])  Functional: ROM is within functional limits [facility])  Muscle strength & Tone: Functionally intact  Muscle strength & Tone: Functionally intact  Sensory: Unimpaired  Sensory: Unimpaired  Palpation: Non-contributory  Palpation: Non-contributory   Thoracic Spine Exam  Inspection: No masses, redness, or swelling Alignment: Symmetrical ROM: Functional: ROM is within functional limits [facility]) Stability: No instability detected Sensory: Unimpaired Muscle strength & Tone: Functionally intact Palpation: No complaints of tenderness  Lumbar Spine Exam  Inspection: No masses, redness, or swelling Alignment: Symmetrical ROM: Functional: Reduced ROM Stability: No instability detected Muscle strength & Tone: Functionally intact Sensory: Unimpaired Palpation: Tender Provocative Tests: Lumbar Hyperextension and rotation test: deferred Patrick's Maneuver: deferred  Gait & Posture Assessment  Ambulation: Patient ambulates using a cane Gait: Modified gait pattern (slower gait speed, wider stride width, and longer stance duration) associated with morbid obesity Posture: Difficulty with positional changes  Lower Extremity Exam    Side: Right lower extremity  Side: Left lower extremity  Inspection: No masses, redness, swelling, or asymmetry ROM:  Inspection: No masses, redness, swelling, or asymmetry ROM:  Functional: ROM is within functional  limits [facility])  Functional: ROM is within functional limits [facility])  Muscle strength & Tone: Functionally intact  Muscle strength & Tone: Functionally intact  Sensory: Unimpaired  Sensory: Unimpaired  Palpation: Non-contributory  Palpation: Non-contributory   Assessment & Plan  Primary Diagnosis & Pertinent Problem List: The encounter diagnosis was Chronic pain.  Visit Diagnosis: 1. Chronic pain     Problems updated and reviewed during this visit: No problems updated.  Problem-specific Plan(s): No problem-specific assessment & plan notes found for this encounter.  No new assessment & plan notes have been filed under this hospital service since the last note was generated. Service: Pain Management   Plan of Care   Problem List Items Addressed This Visit      High   Chronic pain - Primary (Chronic)   Relevant Medications   HYDROcodone-acetaminophen (NORCO) 10-325 MG tablet   HYDROcodone-acetaminophen (NORCO) 10-325 MG tablet   HYDROcodone-acetaminophen (NORCO) 10-325 MG tablet       Pharmacotherapy (Medications Ordered): Meds ordered this encounter  Medications  . HYDROcodone-acetaminophen (NORCO) 10-325 MG tablet    Sig: Take 1 tablet by mouth every 8 (eight) hours as needed for severe pain.    Dispense:  90 tablet    Refill:  0    Do not place this medication, or any other prescription from our practice, on "Automatic Refill". Patient may have prescription filled one day early if pharmacy is closed on scheduled refill date. Do not fill until: 06/13/16 To last until: 07/13/16  . HYDROcodone-acetaminophen (NORCO) 10-325 MG tablet    Sig: Take 1 tablet by mouth every 8 (eight) hours as needed for severe pain.    Dispense:  90 tablet  Refill:  0    Do not place this medication, or any other prescription from our practice, on "Automatic Refill". Patient may have prescription filled one day early if pharmacy is closed on scheduled refill date. Do not fill until:  07/13/16 To last until: 08/12/16  . HYDROcodone-acetaminophen (NORCO) 10-325 MG tablet    Sig: Take 1 tablet by mouth every 8 (eight) hours as needed for severe pain.    Dispense:  90 tablet    Refill:  0    Do not place this medication, or any other prescription from our practice, on "Automatic Refill". Patient may have prescription filled one day early if pharmacy is closed on scheduled refill date. Do not fill until: 08/12/16 To last until: 09/11/16    [facility] & Procedure Ordered: No orders of the defined types were placed in this encounter.    Imaging Ordered: None  Interventional Therapies: Scheduled:  None at this time.    Considering:  Nondistended.    PRN Procedures:  None at this time.    Referral(s) or Consult(s): None at this time.  New Prescriptions   No medications on file    Medications administered during this visit: Ms. Struckman had no medications administered during this visit.  Requested PM Follow-up: Return in about 4 months (around 09/03/2016) for Medication Management, (3-Mo).  Future Appointments Date Time Provider Department Center  09/03/2016 8:40 AM Delano Metz, MD [facility] None    Primary Care Physician: Leanna Sato, MD Location: [facility] Note by: Sydnee Levans. Laban Emperor, M.D, DABA, DABAPM, DABPM, DABIPP, FIPP  Pain Score Disclaimer: We use the NRS-11 scale. This is a self-reported, subjective measurement of pain severity with only modest accuracy. It is used primarily to identify changes within a particular patient. It must be understood that outpatient pain scales are significantly less accurate that those used for research, where they can be applied under ideal controlled circumstances with minimal exposure to variables. In reality, the score is likely to be a combination of pain intensity and pain affect, where pain affect describes the degree of emotional arousal or changes in action readiness caused by  the sensory experience of pain. Factors such as social and work situation, setting, emotional state, anxiety levels, expectation, and prior pain experience may influence pain perception and show large inter-individual differences that may also be affected by time variables.  Patient instructions provided during this appointment: There are no Patient Instructions on file for this visit.

## 2016-05-09 NOTE — Progress Notes (Signed)
Going into [facility] for stent removal surgery May 14 2016

## 2016-05-09 NOTE — Progress Notes (Signed)
Safety precautions to be maintained throughout the outpatient stay will include: orient to surroundings, keep bed in low position, maintain call bell within reach at all times, provide assistance with transfer out of bed and ambulation. Hydrocodone pill count # 23/90  Filled 04-14-16

## 2016-09-03 ENCOUNTER — Ambulatory Visit: Payer: Medicare Other | Attending: Pain Medicine | Admitting: Pain Medicine

## 2016-09-03 ENCOUNTER — Encounter: Payer: Self-pay | Admitting: Pain Medicine

## 2016-09-03 VITALS — BP 132/72 | HR 98 | Temp 98.4°F | Resp 16 | Ht 60.0 in | Wt 205.0 lb

## 2016-09-03 DIAGNOSIS — E119 Type 2 diabetes mellitus without complications: Secondary | ICD-10-CM | POA: Insufficient documentation

## 2016-09-03 DIAGNOSIS — Z7984 Long term (current) use of oral hypoglycemic drugs: Secondary | ICD-10-CM | POA: Diagnosis not present

## 2016-09-03 DIAGNOSIS — G894 Chronic pain syndrome: Secondary | ICD-10-CM

## 2016-09-03 DIAGNOSIS — K449 Diaphragmatic hernia without obstruction or gangrene: Secondary | ICD-10-CM | POA: Insufficient documentation

## 2016-09-03 DIAGNOSIS — K219 Gastro-esophageal reflux disease without esophagitis: Secondary | ICD-10-CM | POA: Insufficient documentation

## 2016-09-03 DIAGNOSIS — Z9841 Cataract extraction status, right eye: Secondary | ICD-10-CM | POA: Insufficient documentation

## 2016-09-03 DIAGNOSIS — M549 Dorsalgia, unspecified: Secondary | ICD-10-CM | POA: Insufficient documentation

## 2016-09-03 DIAGNOSIS — T402X5A Adverse effect of other opioids, initial encounter: Secondary | ICD-10-CM

## 2016-09-03 DIAGNOSIS — M47816 Spondylosis without myelopathy or radiculopathy, lumbar region: Secondary | ICD-10-CM | POA: Diagnosis not present

## 2016-09-03 DIAGNOSIS — Z79891 Long term (current) use of opiate analgesic: Secondary | ICD-10-CM | POA: Insufficient documentation

## 2016-09-03 DIAGNOSIS — M545 Low back pain: Secondary | ICD-10-CM | POA: Diagnosis not present

## 2016-09-03 DIAGNOSIS — K5903 Drug induced constipation: Secondary | ICD-10-CM

## 2016-09-03 DIAGNOSIS — Z9049 Acquired absence of other specified parts of digestive tract: Secondary | ICD-10-CM | POA: Insufficient documentation

## 2016-09-03 DIAGNOSIS — M533 Sacrococcygeal disorders, not elsewhere classified: Secondary | ICD-10-CM | POA: Insufficient documentation

## 2016-09-03 DIAGNOSIS — Z9842 Cataract extraction status, left eye: Secondary | ICD-10-CM | POA: Diagnosis not present

## 2016-09-03 DIAGNOSIS — Z90710 Acquired absence of both cervix and uterus: Secondary | ICD-10-CM | POA: Insufficient documentation

## 2016-09-03 DIAGNOSIS — M069 Rheumatoid arthritis, unspecified: Secondary | ICD-10-CM | POA: Insufficient documentation

## 2016-09-03 DIAGNOSIS — Z833 Family history of diabetes mellitus: Secondary | ICD-10-CM | POA: Insufficient documentation

## 2016-09-03 DIAGNOSIS — D649 Anemia, unspecified: Secondary | ICD-10-CM | POA: Diagnosis not present

## 2016-09-03 DIAGNOSIS — M25551 Pain in right hip: Secondary | ICD-10-CM | POA: Diagnosis not present

## 2016-09-03 DIAGNOSIS — G8929 Other chronic pain: Secondary | ICD-10-CM

## 2016-09-03 DIAGNOSIS — G5600 Carpal tunnel syndrome, unspecified upper limb: Secondary | ICD-10-CM | POA: Insufficient documentation

## 2016-09-03 DIAGNOSIS — Z9884 Bariatric surgery status: Secondary | ICD-10-CM | POA: Insufficient documentation

## 2016-09-03 DIAGNOSIS — Z6841 Body Mass Index (BMI) 40.0 and over, adult: Secondary | ICD-10-CM | POA: Diagnosis not present

## 2016-09-03 DIAGNOSIS — N993 Prolapse of vaginal vault after hysterectomy: Secondary | ICD-10-CM | POA: Insufficient documentation

## 2016-09-03 DIAGNOSIS — E7800 Pure hypercholesterolemia, unspecified: Secondary | ICD-10-CM | POA: Diagnosis not present

## 2016-09-03 DIAGNOSIS — Z91041 Radiographic dye allergy status: Secondary | ICD-10-CM | POA: Insufficient documentation

## 2016-09-03 DIAGNOSIS — I10 Essential (primary) hypertension: Secondary | ICD-10-CM | POA: Diagnosis not present

## 2016-09-03 DIAGNOSIS — F329 Major depressive disorder, single episode, unspecified: Secondary | ICD-10-CM | POA: Insufficient documentation

## 2016-09-03 DIAGNOSIS — Z8249 Family history of ischemic heart disease and other diseases of the circulatory system: Secondary | ICD-10-CM | POA: Insufficient documentation

## 2016-09-03 MED ORDER — HYDROCODONE-ACETAMINOPHEN 10-325 MG PO TABS: 1.0000 | ORAL_TABLET | Freq: Three times a day (TID) | ORAL | 0 refills | Status: DC | PRN

## 2016-09-03 MED ORDER — BENEFIBER PO POWD: ORAL | 99 refills | Status: DC

## 2016-09-03 NOTE — Progress Notes (Signed)
Safety precautions to be maintained throughout the outpatient stay will include: orient to surroundings, keep bed in low position, maintain call bell within reach at all times, provide assistance with transfer out of bed and ambulation. Pt's legs hav one plus edems pill count  9 in weekly med box and 24 in bottle of 10/325 hydrocodne /tylenol

## 2016-09-03 NOTE — Patient Instructions (Signed)

## 2016-09-03 NOTE — Progress Notes (Signed)
Patient's Name: Mariah Poole  MRN: [id number]  Referring Provider: Leanna SatoMiles, Linda M, MD  DOB: December 24, 1945  PCP: Leanna SatoMILES,LINDA M, MD  DOS: 09/03/2016  Note by: Sydnee LevansFrancisco A. Laban EmperorNaveira, MD  Service setting: Ambulatory outpatient  Specialty: Interventional Pain Management  Location: [facility] (AMB) Pain Management Facility    Patient type: Established   Primary Reason(s) for Visit: Encounter for prescription drug management (Level of risk: moderate) CC: Back Pain (right buttock ) and Leg Pain (right leg )  HPI  [name] is a 70 y.o. year old, female patient, who comes today for an initial evaluation. She has Encounter for therapeutic drug level monitoring; Long term current use of opiate analgesic; Opiate use (30 MME/Day); Chronic pain; Chronic pain syndrome; Chronic hip pain (Right); Opiate analgesic use agreement exists; Sacral pain; Morbid obesity (HCC); History of carpal tunnel syndrome, right side; Rheumatoid arthritis (HCC); Depression; Opioid-induced constipation; History of contrast media allergy. (IVP dye); Diabetes mellitus (HCC); BP (high blood pressure); Other specified health status; Bariatric surgery status; History of cholecystectomy; Lumbar spondylosis; Long term prescription opiate use; Allergic to radiographic contrast media; Constipation due to opioid therapy; Administrative encounter; Arthralgia of hip (Right); H/O disease; Prolapse of vaginal vault after hysterectomy; Hieralgia; Chronic low back pain (Location of Primary Source of Pain) (Bilateral) (R>L); and Lumbar spondylosis on her problem list.. Her primarily concern today is the Back Pain (right buttock ) and Leg Pain (right leg )  Pain Assessment: Self-Reported Pain Score: 7 /10 Clinically the patient looks like a 3/10 Reported level is inconsistent with clinical observations. Information on the proper use of the pain score provided to the patient today. Pain Type: Chronic pain Pain Location: Back (right ) Pain Orientation:  Right Pain Descriptors / Indicators: Constant, Aching, Cramping Pain Frequency: Constant  The patient comes into the clinics today for pharmacological management of her chronic pain. I last saw this patient on 05/09/2016. The patient  reports that she uses drugs. Her body mass index is 40.04 kg/m.  Controlled Substance Pharmacotherapy Assessment & REMS (Risk Evaluation and Mitigation Strategy)  Analgesic: Hydrocodone/APAP 10/325 one every 8 hours PRN (30 mg/day) MME/day: 30 mg/day Pill Count: pill count  9 in weekly med box and 24 in bottle of 10/325 hydrocodne /tylenol. Pharmacokinetics: Onset of action (Liberation/Absorption): Within expected pharmacological parameters Time to Peak effect (Distribution): Timing and results are as within normal expected parameters Duration of action (Metabolism/Excretion): Within normal limits for medication Pharmacodynamics: Analgesic Effect: More than 50% Activity Facilitation: Medication(s) allow patient to sit, stand, walk, and do the basic ADLs Perceived Effectiveness: Described as relatively effective, allowing for increase in activities of daily living (ADL) Side-effects or Adverse reactions: None reported Monitoring: [place] PMP: Online review of the past 3961-month period conducted. Compliant with practice rules and regulations List of all UDS test(s) done:  Lab Results  Component Value Date   TOXASSSELUR FINAL 03/15/2016   TOXASSSELUR FINAL 12/13/2015   Last UDS on record: ToxAssure Select 13  Date Value Ref Range Status  03/15/2016 FINAL  Final    Comment:    ==================================================================== TOXASSURE SELECT 13 (MW) ==================================================================== Test                             Result       Flag       Units Drug Absent but Declared for Prescription Verification   Hydrocodone  Not Detected UNEXPECTED ng/mg  creat ==================================================================== Test                      Result    Flag   Units      Ref Range   Creatinine              208              mg/dL      >=45 ==================================================================== Declared Medications:  The flagging and interpretation on this report are based on the  following declared medications.  Unexpected results may arise from  inaccuracies in the declared medications.  **Note: The testing scope of this panel includes these medications:  Hydrocodone (Norco)  **Note: The testing scope of this panel does not include following  reported medications:  Acetaminophen (Norco)  Albuterol  Atenolol (Tenormin)  Cholecalciferol  Duloxetine (Cymbalta)  Furosemide (Lasix)  Glipizide  Hydrochlorothiazide  Losartan (Cozaar)  Lubiprostone (Amitiza)  Magnesium (Mag-Ox)  Metformin (Glucophage)  Multivitamin  Naproxen  Pantoprazole (Protonix)  Potassium (Klor-Con)  Pravastatin (Pravachol)  Supplement  Turmeric  Vitamin C ==================================================================== For clinical consultation, please call [phone]. ====================================================================    UDS interpretation: Compliant          Medication Assessment Form: Reviewed. Patient indicates being compliant with therapy Treatment compliance: Compliant Risk Assessment: Aberrant Behavior: None observed today Substance Use Disorder (SUD) Risk Level: No change since last visit Risk of opioid abuse or dependence: 0.7-3.0% with doses ? 36 MME/day and 6.1-26% with doses ? 120 MME/day. Opioid Risk Tool (ORT) Score: 1   Low Risk for SUD (Score <3)  Pharmacologic Plan: No change in therapy, at this time  Laboratory Chemistry  Inflammation Markers Lab Results  Component Value Date   ESRSEDRATE 37 (H) 01/06/2013   Renal Function Lab Results  Component Value Date   BUN 23 (H)  10/27/2015   CREATININE 0.93 10/27/2015   GFRAA >60 10/27/2015   GFRNONAA >60 10/27/2015   Hepatic Function Lab Results  Component Value Date   AST 20 10/27/2015   ALT 16 10/27/2015   ALBUMIN 4.0 10/27/2015   Electrolytes Lab Results  Component Value Date   NA 138 10/27/2015   K 3.3 (L) 10/27/2015   CL 95 (L) 10/27/2015   CALCIUM 9.5 10/27/2015   MG 1.4 (L) 12/16/2012   Pain Modulating Vitamins No results found for: Jerry Caras [id number], BX0383FX8, VA9191YO0, 25OHVITD1, 25OHVITD2, 25OHVITD3, VITAMINB12 Coagulation Parameters Lab Results  Component Value Date   INR 0.94 11/23/2009   LABPROT 12.5 11/23/2009   APTT 20 (L) 11/23/2009   PLT 346 10/27/2015   Cardiovascular Lab Results  Component Value Date   BNP 265 (H) 12/16/2012   HGB 13.7 10/27/2015   HCT 43.1 10/27/2015   Note: Lab results reviewed.  Recent Diagnostic Imaging  Dg Chest 2 View  Result Date: 10/27/2015 CLINICAL DATA:  Chest pain EXAM: CHEST  2 VIEW COMPARISON:  12/16/2012 FINDINGS: There is no edema, consolidation, effusion, or pneumothorax. Normal heart size and stable aortic tortuosity. Moderate hiatal hernia with gastric surgical clips. IMPRESSION: 1. No acute finding. 2. Moderate hiatal hernia. Electronically Signed   By: Marnee Spring M.D.   On: 10/27/2015 15:21   Meds  The patient has a current medication list which includes the following prescription(s): albuterol, atenolol, vitamin d3, duloxetine, furosemide, glipizide, hydrochlorothiazide, hydrocodone-acetaminophen, hydrocodone-acetaminophen, hydrocodone-acetaminophen, losartan, magnesium oxide, metformin, multi-vitamins, naproxen, pantoprazole, potassium chloride sa, pravastatin, turmeric curcumin, and benefiber.  Current Outpatient Prescriptions on File  Prior to Visit  Medication Sig  . albuterol (PROVENTIL HFA;VENTOLIN HFA) 108 (90 BASE) MCG/ACT inhaler Inhale 2 puffs into the lungs every 4 (four) hours as needed for wheezing or shortness of  breath.  Marland Kitchen atenolol (TENORMIN) 25 MG tablet Take 25 mg by mouth daily.   . Cholecalciferol (VITAMIN D3) 2000 UNITS TABS Take 1 tablet by mouth daily.  . DULoxetine (CYMBALTA) 30 MG capsule Take 30 mg by mouth 2 (two) times daily.  . furosemide (LASIX) 20 MG tablet Take 20 mg by mouth daily.  Marland Kitchen glipiZIDE (GLUCOTROL XL) 5 MG 24 hr tablet Take 5 mg by mouth daily with breakfast.  . hydrochlorothiazide (HYDRODIURIL) 25 MG tablet Take 25 mg by mouth daily.  Marland Kitchen losartan (COZAAR) 25 MG tablet Take 25 mg by mouth daily.  . magnesium oxide (MAG-OX) 400 (241.3 Mg) MG tablet TAKE 1 BY MOUTH ONCE A DAY, DO NOT TAKE WHILE TAKING ANY ANTIBIOTICS DR. Estelle June [phone]  . metFORMIN (GLUCOPHAGE-XR) 500 MG 24 hr tablet Take 1,000 mg by mouth.  . Multiple Vitamin (MULTI-VITAMINS) TABS Take by mouth.  . naproxen (NAPROSYN) 250 MG tablet Take 250 mg by mouth.  . pantoprazole (PROTONIX) 40 MG tablet Take 40 mg by mouth daily.  . potassium chloride SA (K-DUR,KLOR-CON) 20 MEQ tablet Take 20 mEq by mouth daily.   . pravastatin (PRAVACHOL) 20 MG tablet Take 20 mg by mouth at bedtime. Reported on 12/13/2015  . Turmeric Curcumin 500 MG CAPS Take by mouth.   No current facility-administered medications on file prior to visit.    ROS  Constitutional: Denies any fever or chills Gastrointestinal: No reported hemesis, hematochezia, vomiting, or acute GI distress Musculoskeletal: Denies any acute onset joint swelling, redness, loss of ROM, or weakness Neurological: No reported episodes of acute onset apraxia, aphasia, dysarthria, agnosia, amnesia, paralysis, loss of coordination, or loss of consciousness  Allergies  Ms. Pember is allergic to contrast media [iodinated diagnostic agents].  PFSH  Medical:  Ms. Lindo  has a past medical history of Anemia; Arthritis; COPD (chronic obstructive pulmonary disease) (HCC); Depression; Diabetes mellitus without complication (HCC); GERD (gastroesophageal reflux disease);  Hiatal hernia; History of contrast media allergy. (IVP dye) (09/01/2015); History of hiatal hernia; Hypercholesteremia; Hypertension; Morbid obesity (HCC); Scoliosis; and Shortness of breath dyspnea. Family: family history includes Diabetes in her mother; Heart disease in her father. Surgical:  has a past surgical history that includes Abdominal hysterectomy; Cataract extraction w/ intraocular lens implant (Right); Gastric bypass; Hernia repair; Cataract extraction w/PHACO (Left, 07/19/2015); and Cholecystectomy. Tobacco:  reports that she has never smoked. She does not have any smokeless tobacco history on file. Alcohol:  reports that she does not drink alcohol. Drug:  reports that she uses drugs.  Constitutional Exam  General appearance: Well nourished, well developed, and well hydrated. In no acute distress Vitals:   09/03/16 0839  BP: 132/72  Pulse: 98  Resp: 16  Temp: 98.4 F (36.9 C)  TempSrc: Oral  SpO2: 98%  Weight: 205 lb (93 kg)  Height: 5' (1.524 m)  BMI Assessment: Estimated body mass index is 40.04 kg/m as calculated from the following:   Height as of this encounter: 5' (1.524 m).   Weight as of this encounter: 205 lb (93 kg).   BMI interpretation: (>40 kg/m2) = Extreme obesity (Class III): This range is associated with a 254% higher incidence of chronic pain. BMI Readings from Last 4 Encounters:  09/03/16 40.04 kg/m  05/09/16 39.45 kg/m  03/15/16 39.65 kg/m  12/13/15 42.18 kg/m   Wt Readings from Last 4 Encounters:  09/03/16 205 lb (93 kg)  05/09/16 202 lb (91.6 kg)  03/15/16 203 lb (92.1 kg)  12/13/15 216 lb (98 kg)  Psych/Mental status: Alert and oriented x 3 (person, place, & time) Eyes: PERLA Respiratory: No evidence of acute respiratory distress  Cervical Spine Exam  Inspection: No masses, redness, or swelling Alignment: Symmetrical Functional ROM: Unrestricted ROM Stability: No instability detected Muscle strength & Tone: Functionally  intact Sensory: Unimpaired Palpation: Non-contributory  Upper Extremity (UE) Exam    Side: Right upper extremity  Side: Left upper extremity  Inspection: No masses, redness, swelling, or asymmetry  Inspection: No masses, redness, swelling, or asymmetry  Functional ROM: Unrestricted ROM         Functional ROM: Unrestricted ROM          Muscle strength & Tone: Functionally intact  Muscle strength & Tone: Functionally intact  Sensory: Unimpaired  Sensory: Unimpaired  Palpation: Non-contributory  Palpation: Non-contributory   Thoracic Spine Exam  Inspection: No masses, redness, or swelling Alignment: Symmetrical Functional ROM: Unrestricted ROM Stability: No instability detected Sensory: Unimpaired Muscle strength & Tone: Functionally intact Palpation: Non-contributory  Lumbar Spine Exam  Inspection: No masses, redness, or swelling Alignment: Symmetrical Functional ROM: Decreased ROM Stability: No instability detected Muscle strength & Tone: Functionally intact Sensory: Movement-associated pain Palpation: Complains of area being tender to palpation Provocative Tests: Lumbar Hyperextension and rotation test: evaluation deferred today       Patrick's Maneuver: evaluation deferred today              Gait & Posture Assessment  Ambulation: Patient ambulates using a cane Gait: Very limited, using assistive device to ambulate Posture: Antalgic   Lower Extremity Exam    Side: Right lower extremity  Side: Left lower extremity  Inspection: No masses, redness, swelling, or asymmetry  Inspection: No masses, redness, swelling, or asymmetry  Functional ROM: Decreased ROM for hip joint  Functional ROM: Decreased ROM for hip joint  Muscle strength & Tone: Functionally intact  Muscle strength & Tone: Functionally intact  Sensory: Unimpaired  Sensory: Unimpaired  Palpation: Non-contributory  Palpation: Non-contributory   Assessment  Primary Diagnosis & Pertinent Problem List: The primary  encounter diagnosis was Chronic pain syndrome. Diagnoses of Opioid-induced constipation, Chronic bilateral low back pain without sciatica, Chronic hip pain (Right), Pain of right hip joint, and Lumbar spondylosis were also pertinent to this visit.  Visit Diagnosis: 1. Chronic pain syndrome   2. Opioid-induced constipation   3. Chronic bilateral low back pain without sciatica   4. Chronic hip pain (Right)   5. Pain of right hip joint   6. Lumbar spondylosis    Plan of Care  Pharmacotherapy (Medications Ordered): Meds ordered this encounter  Medications  . HYDROcodone-acetaminophen (NORCO) 10-325 MG tablet    Sig: Take 1 tablet by mouth every 8 (eight) hours as needed for severe pain.    Dispense:  90 tablet    Refill:  0    Do not place this medication, or any other prescription from our practice, on "Automatic Refill". Patient may have prescription filled one day early if pharmacy is closed on scheduled refill date. Do not fill until: 09/11/16 To last until: 10/11/16  . HYDROcodone-acetaminophen (NORCO) 10-325 MG tablet    Sig: Take 1 tablet by mouth every 8 (eight) hours as needed for severe pain.    Dispense:  90 tablet    Refill:  0  Do not place this medication, or any other prescription from our practice, on "Automatic Refill". Patient may have prescription filled one day early if pharmacy is closed on scheduled refill date. Do not fill until: 10/11/16 To last until: 11/10/16  . HYDROcodone-acetaminophen (NORCO) 10-325 MG tablet    Sig: Take 1 tablet by mouth every 8 (eight) hours as needed for severe pain.    Dispense:  90 tablet    Refill:  0    Do not place this medication, or any other prescription from our practice, on "Automatic Refill". Patient may have prescription filled one day early if pharmacy is closed on scheduled refill date. Do not fill until: 11/10/16 To last until: 12/10/16  . Wheat Dextrin (BENEFIBER) POWD    Sig: Stir 2 tsp into 4-8 oz of any  non-carbonated beverage or soft food (hot or cold). Stir well until dissolved. Take three times daily    Dispense:  500 g    Refill:  PRN    This is an OTC product. The prescription is a patient reminder to use this particular product.   New Prescriptions   No medications on file   Medications administered during this visit: Ms. Denley had no medications administered during this visit. Lab-work, Procedure(s), & Referral(s) Ordered: No orders of the defined types were placed in this encounter.  Imaging & Referral(s) Ordered: None  Interventional Therapies: Pending/Scheduled/Planned:   None at this time.    Considering:   None at this time.    PRN Procedures:   Palliative bilateral lumbar facet block under fluoroscopic guidance and IV sedation.  Palliative intra-articular hip joint injection under fluoroscopic guidance and IV sedation.    Requested PM Follow-up: Return in about 3 months (around 12/04/2016) for Med-Mgmt.  Future Appointments Date Time Provider Department Center  12/04/2016 9:30 AM Delano Metz, MD [facility] None   Primary Care Physician: Leanna Sato, MD Location: [facility] Note by: Sydnee Levans. Laban Emperor, M.D, DABA, DABAPM, DABPM, DABIPP, FIPP  Pain Score Disclaimer: We use the NRS-11 scale. This is a self-reported, subjective measurement of pain severity with only modest accuracy. It is used primarily to identify changes within a particular patient. It must be understood that outpatient pain scales are significantly less accurate that those used for research, where they can be applied under ideal controlled circumstances with minimal exposure to variables. In reality, the score is likely to be a combination of pain intensity and pain affect, where pain affect describes the degree of emotional arousal or changes in action readiness caused by the sensory experience of pain. Factors such as social and work situation, setting,  emotional state, anxiety levels, expectation, and prior pain experience may influence pain perception and show large inter-individual differences that may also be affected by time variables.  Patient instructions provided during this appointment: Patient Instructions  Pain Management Discharge Instructions  General Discharge Instructions :  If you need to reach your doctor call: Monday-Friday 8:00 am - 4:00 pm at [phone] or toll free [phone].  After clinic hours [phone] to have operator reach doctor.  Bring all of your medication bottles to all your appointments in the pain clinic.  To cancel or reschedule your appointment with Pain Management please remember to call 24 hours in advance to avoid a fee.  Refer to the educational materials which you have been given on: General Risks, I had my Procedure. Discharge Instructions, Post Sedation.  Post Procedure Instructions:  The drugs you were given will stay  in your system until tomorrow, so for the next 24 hours you should not drive, make any legal decisions or drink any alcoholic beverages.  You may eat anything you prefer, but it is better to start with liquids then soups and crackers, and gradually work up to solid foods.  Please notify your doctor immediately if you have any unusual bleeding, trouble breathing or pain that is not related to your normal pain.  Depending on the type of procedure that was done, some parts of your body may feel week and/or numb.  This usually clears up by tonight or the next day.  Walk with the use of an assistive device or accompanied by an adult for the 24 hours.  You may use ice on the affected area for the first 24 hours.  Put ice in a Ziploc bag and cover with a towel and place against area 15 minutes on 15 minutes off.  You may switch to heat after 24 hours.

## 2016-09-27 DIAGNOSIS — K5669 Other partial intestinal obstruction: Secondary | ICD-10-CM | POA: Insufficient documentation

## 2016-12-04 ENCOUNTER — Encounter: Payer: Self-pay | Admitting: Pain Medicine

## 2016-12-04 ENCOUNTER — Ambulatory Visit: Payer: Medicare Other | Attending: Pain Medicine | Admitting: Pain Medicine

## 2016-12-04 ENCOUNTER — Encounter (INDEPENDENT_AMBULATORY_CARE_PROVIDER_SITE_OTHER): Payer: Self-pay

## 2016-12-04 VITALS — BP 119/63 | HR 86 | Temp 98.3°F | Resp 16 | Ht 60.0 in | Wt 204.0 lb

## 2016-12-04 DIAGNOSIS — Z9889 Other specified postprocedural states: Secondary | ICD-10-CM | POA: Insufficient documentation

## 2016-12-04 DIAGNOSIS — D649 Anemia, unspecified: Secondary | ICD-10-CM | POA: Diagnosis not present

## 2016-12-04 DIAGNOSIS — Z7984 Long term (current) use of oral hypoglycemic drugs: Secondary | ICD-10-CM | POA: Diagnosis not present

## 2016-12-04 DIAGNOSIS — K449 Diaphragmatic hernia without obstruction or gangrene: Secondary | ICD-10-CM | POA: Diagnosis not present

## 2016-12-04 DIAGNOSIS — F329 Major depressive disorder, single episode, unspecified: Secondary | ICD-10-CM | POA: Diagnosis not present

## 2016-12-04 DIAGNOSIS — E119 Type 2 diabetes mellitus without complications: Secondary | ICD-10-CM | POA: Diagnosis not present

## 2016-12-04 DIAGNOSIS — M545 Low back pain, unspecified: Secondary | ICD-10-CM

## 2016-12-04 DIAGNOSIS — Z79899 Other long term (current) drug therapy: Secondary | ICD-10-CM | POA: Diagnosis not present

## 2016-12-04 DIAGNOSIS — G8929 Other chronic pain: Secondary | ICD-10-CM

## 2016-12-04 DIAGNOSIS — Z5181 Encounter for therapeutic drug level monitoring: Secondary | ICD-10-CM | POA: Diagnosis not present

## 2016-12-04 DIAGNOSIS — K219 Gastro-esophageal reflux disease without esophagitis: Secondary | ICD-10-CM | POA: Insufficient documentation

## 2016-12-04 DIAGNOSIS — G894 Chronic pain syndrome: Secondary | ICD-10-CM | POA: Diagnosis not present

## 2016-12-04 DIAGNOSIS — Z90710 Acquired absence of both cervix and uterus: Secondary | ICD-10-CM | POA: Diagnosis not present

## 2016-12-04 DIAGNOSIS — F1190 Opioid use, unspecified, uncomplicated: Secondary | ICD-10-CM

## 2016-12-04 DIAGNOSIS — Z79891 Long term (current) use of opiate analgesic: Secondary | ICD-10-CM | POA: Diagnosis not present

## 2016-12-04 DIAGNOSIS — J449 Chronic obstructive pulmonary disease, unspecified: Secondary | ICD-10-CM | POA: Insufficient documentation

## 2016-12-04 DIAGNOSIS — Z9049 Acquired absence of other specified parts of digestive tract: Secondary | ICD-10-CM | POA: Insufficient documentation

## 2016-12-04 MED ORDER — HYDROCODONE-ACETAMINOPHEN 10-325 MG PO TABS: 1.0000 | ORAL_TABLET | Freq: Three times a day (TID) | ORAL | 0 refills | Status: DC | PRN

## 2016-12-04 NOTE — Patient Instructions (Signed)
Post-op Pain Management on a Chronic Pain Patient  Why should the surgeon manage his patient's post-op pain? The Surgeon is uniquely qualified to determine the amount of post-operative pain to be expected on a procedure or surgery that he/she has performed. Even with similar surgeries, the surgeon's perspective on expected pain is unique, since he/she performed the procedure and knows the degree of difficulty and/or tissue damage involved in each particular case. The surgeon is also up to date on events such as blood loss, intraoperative complications, and PO (per orum) status that may influence not only the patient's dose and schedule, but route of administration as well.  How about telling chronic pain patients to just double up or increase their usual pain medication intake to compensate for the increased pain? This is a bad idea since it will lead to the patient running out of his/her usual medications early and this may create a problem at the level of the insurance, which supplies medications based on the amount and schedule stated on the prescription. Running out early may trigger an event where the refill is denied by the insurance company and/or pharmacy. In addition, this practice provides a very poor paper trail as to why this patient ran out of medication early. In addition, from the perspective of the pain physician, it creates a nightmare in the accounting of the patient's medication.  So, what should I do as a Surgeon when confronted with a patient that needs surgery and already takes a significant amount of pain medicine for their chronic pain, which may or may not be related to the surgery I have to perform?  This is what the surgeon should do: 1. Do not change the dose or schedule of the pain medications prescribed by the pain specialist. This medication regimen allows for the patient's chronic pain to be under control, so as to bring that patient down to the level of an average  individual. 2. Have the patient continue their usual pain management regimen, without any alterations. In addition, manage the post-operative pain as you would on any other "narcotic naive" patient. Do not attempt to compensate for tolerance. This is what the patient's usual regimen will do for you. Simply treat the patient as if they had no chronic pain and as if they were taking no other pain medications. 3. Talk to the patient about the medication, just like you would for anyone else. Do not assume that they are experts in opioids. Make sure you let the patient know that the medication is to be used only if absolutely necessary. (PRN) 4. Prescribe the medication for as long as you would on any other patient undergoing the same type of surgery. Prescribe for the same average amount of time that you would on any other patient. Avoid prescribing for longer periods. 5. Send us a copy of the operative report with information about your choice of the post-op pain medication provided. 6. Keep us informed of any complications that may prolong the average duration patient's post-op pain.  If you have any questions, please feel to contact us at [phone]. _____________________________________________________________________________________________   

## 2016-12-04 NOTE — Progress Notes (Signed)
Patient's Name: Mariah Poole  MRN: [id number]  Referring Provider: Marguerita Merles, MD  DOB: September 11, 1946  PCP: Marguerita Merles, MD  DOS: 12/04/2016  Note by: Kathlen Brunswick. Dossie Arbour, MD  Service setting: Ambulatory outpatient  Specialty: Interventional Pain Management  Location: [facility] (AMB) Pain Management Facility    Patient type: Established   Primary Reason(s) for Visit: Encounter for prescription drug management (Level of risk: moderate) CC: Rectal Pain (right buttocks); Hand Pain (right  and sometimes the left); and Arm Pain (right and sometimes the left arm)  HPI  Mariah Poole is a 71 y.o. year old, female patient, who comes today for a medication management evaluation. She has Encounter for therapeutic drug level monitoring; Long term current use of opiate analgesic; Opiate use (30 MME/Day); Chronic pain syndrome; Chronic hip pain (Right); Opiate analgesic use agreement exists; Sacral pain; Morbid obesity ([place]); History of carpal tunnel syndrome, right side; Rheumatoid arthritis ([place]); Depression; Opioid-induced constipation; History of contrast media allergy. (IVP dye); Diabetes mellitus ([place]); BP (high blood pressure); Other specified health status; Bariatric surgery status; History of cholecystectomy; Lumbar spondylosis; Long term prescription opiate use; Allergic to radiographic contrast media; Constipation due to opioid therapy; Administrative encounter; Arthralgia of hip (Right); H/O disease; Prolapse of vaginal vault after hysterectomy; Hieralgia; Chronic low back pain (Location of Primary Source of Pain) (Bilateral) (R>L); Lumbar spondylosis; and Other partial intestinal obstruction on her problem list. Her primarily concern today is the Rectal Pain (right buttocks); Hand Pain (right  and sometimes the left); and Arm Pain (right and sometimes the left arm)  Pain Assessment: Self-Reported Pain Score: 4 /10             Reported level is compatible with observation.       Pain Type: Chronic  pain Pain Location: Buttocks Pain Orientation: Right Pain Descriptors / Indicators: Aching, Constant (more pain when standing and moving; and pain subsides when sitting) Pain Frequency: Constant  Mariah Poole was last seen on 09/03/2016 for medication management. During today's appointment we reviewed Mariah Poole's chronic pain status, as well as her outpatient medication regimen. She is pending abdominal surgery for a hernia.  The patient  reports that she uses drugs. Her body mass index is 39.84 kg/m.  Further details on both, my assessment(s), as well as the proposed treatment plan, please see below.  Controlled Substance Pharmacotherapy Assessment REMS (Risk Evaluation and Mitigation Strategy)  Analgesic:Hydrocodone/APAP 10/325 one every 8 hours PRN (30 mg/day) MME/day:30 mg/day Evon Slack, RN  12/04/2016  9:49 AM  Sign at close encounter Nursing Pain Medication Assessment:  Safety precautions to be maintained throughout the outpatient stay will include: orient to surroundings, keep bed in low position, maintain call bell within reach at all times, provide assistance with transfer out of bed and ambulation.  Medication Inspection Compliance: Pill count conducted under aseptic conditions, in front of the patient. Neither the pills nor the bottle was removed from the patient's sight at any time. Once count was completed pills were immediately returned to the patient in their original bottle.  Medication: Hydrocodone/APAP Pill Count: 28 of 90 pills remain Bottle Appearance: Standard pharmacy container. Clearly labeled. Filled Date: 32 / 22/ 2017 Medication last intake: 01//16/2018   Pharmacokinetics: Liberation and absorption (onset of action): WNL Distribution (time to peak effect): WNL Metabolism and excretion (duration of action): WNL         Pharmacodynamics: Desired effects: Analgesia: Mariah Poole reports >50% benefit. Functional ability: Patient reports that medication  allows her  to accomplish basic ADLs Clinically meaningful improvement in function (CMIF): Sustained CMIF goals met Perceived effectiveness: Described as relatively effective, allowing for increase in activities of daily living (ADL) Undesirable effects: Side-effects or Adverse reactions: None reported Monitoring: [place] PMP: Online review of the past 55-monthperiod conducted. Compliant with practice rules and regulations List of all UDS test(s) done:  Lab Results  Component Value Date   TOXASSSELUR FINAL 03/15/2016   [place] 12/13/2015   Last UDS on record: ToxAssure Select 13  Date Value Ref Range Status  03/15/2016 FINAL  Final    Comment:    ==================================================================== TOXASSURE SELECT 13 (MW) ==================================================================== Test                             Result       Flag       Units Drug Absent but Declared for Prescription Verification   Hydrocodone                    Not Detected UNEXPECTED ng/mg creat ==================================================================== Test                      Result    Flag   Units      Ref Range   Creatinine              208              mg/dL      >=20 ==================================================================== Declared Medications:  The flagging and interpretation on this report are based on the  following declared medications.  Unexpected results may arise from  inaccuracies in the declared medications.  **Note: The testing scope of this panel includes these medications:  Hydrocodone (Norco)  **Note: The testing scope of this panel does not include following  reported medications:  Acetaminophen (Norco)  Albuterol  Atenolol (Tenormin)  Cholecalciferol  Duloxetine (Cymbalta)  Furosemide (Lasix)  Glipizide  Hydrochlorothiazide  Losartan (Cozaar)  Lubiprostone (Amitiza)  Magnesium (Mag-Ox)  Metformin (Glucophage)  Multivitamin   Naproxen  Pantoprazole (Protonix)  Potassium (Klor-Con)  Pravastatin (Pravachol)  Supplement  Turmeric  Vitamin C ==================================================================== For clinical consultation, please call [phone] ====================================================================    UDS interpretation: Compliant          Medication Assessment Form: Reviewed. Patient indicates being compliant with therapy Treatment compliance: Compliant Risk Assessment Profile: Aberrant behavior: See prior evaluations. None observed or detected today Comorbid factors increasing risk of overdose: See prior notes. No additional risks detected today Risk of substance use disorder (SUD): Low Opioid Risk Tool (ORT) Total Score: 6  Interpretation Table:  Score <3 = Low Risk for SUD  Score between 4-7 = Moderate Risk for SUD  Score >8 = High Risk for Opioid Abuse   Risk Mitigation Strategies:  Patient Counseling: Covered Patient-Prescriber Agreement (PPA): Present and active  Notification to other healthcare providers: Done  Pharmacologic Plan: No change in therapy, at this time  Laboratory Chemistry  Inflammation Markers Lab Results  Component Value Date   ESRSEDRATE 37 (H) 01/06/2013   Renal Function Lab Results  Component Value Date   BUN 23 (H) 10/27/2015   CREATININE 0.93 10/27/2015   GFRAA >60 10/27/2015   GFRNONAA >60 10/27/2015   Hepatic Function Lab Results  Component Value Date   AST 20 10/27/2015   ALT 16 10/27/2015   ALBUMIN 4.0 10/27/2015   Electrolytes Lab Results  Component Value Date  NA 138 10/27/2015   K 3.3 (L) 10/27/2015   CL 95 (L) 10/27/2015   CALCIUM 9.5 10/27/2015   MG 1.4 (L) 12/16/2012   Pain Modulating Vitamins No results found for: Marveen Reeks, [id number], [id number], 25OHVITD1, 25OHVITD2, 25OHVITD3, VITAMINB12 Coagulation Parameters Lab Results  Component Value Date   INR 0.94 11/23/2009   LABPROT 12.5  11/23/2009   APTT 20 (L) 11/23/2009   PLT 346 10/27/2015   Cardiovascular Lab Results  Component Value Date   BNP 265 (H) 12/16/2012   HGB 13.7 10/27/2015   HCT 43.1 10/27/2015   Note: Lab results reviewed.  Recent Diagnostic Imaging Review  Dg Chest 2 View  Result Date: 10/27/2015 CLINICAL DATA:  Chest pain EXAM: CHEST  2 VIEW COMPARISON:  12/16/2012 FINDINGS: There is no edema, consolidation, effusion, or pneumothorax. Normal heart size and stable aortic tortuosity. Moderate hiatal hernia with gastric surgical clips. IMPRESSION: 1. No acute finding. 2. Moderate hiatal hernia. Electronically Signed   By: Monte Fantasia M.D.   On: 10/27/2015 15:21   Note: Imaging results reviewed.          Meds  The patient has a current medication list which includes the following prescription(s): accu-chek aviva plus, accu-chek softclix lancets, albuterol, atenolol, accu-chek aviva plus, vitamin d3, duloxetine, furosemide, hydrocodone-acetaminophen, hydrocodone-acetaminophen, hydrocodone-acetaminophen, losartan, magnesium oxide, metformin, multi-vitamins, naproxen, ondansetron, pantoprazole, potassium chloride sa, pravastatin, turmeric curcumin, and vitamin c.  Current Outpatient Prescriptions on File Prior to Visit  Medication Sig  . albuterol (PROVENTIL HFA;VENTOLIN HFA) 108 (90 BASE) MCG/ACT inhaler Inhale 2 puffs into the lungs every 4 (four) hours as needed for wheezing or shortness of breath.  Marland Kitchen atenolol (TENORMIN) 25 MG tablet Take 25 mg by mouth daily.   . Cholecalciferol (VITAMIN D3) 2000 UNITS TABS Take 1 tablet by mouth daily.  . DULoxetine (CYMBALTA) 30 MG capsule Take 30 mg by mouth 2 (two) times daily.  . furosemide (LASIX) 20 MG tablet Take 20 mg by mouth daily.  Marland Kitchen losartan (COZAAR) 25 MG tablet Take 25 mg by mouth daily.  . magnesium oxide (MAG-OX) 400 (241.3 Mg) MG tablet TAKE 1 BY MOUTH ONCE A DAY, DO NOT TAKE WHILE TAKING ANY ANTIBIOTICS DR. Elizbeth Squires [phone]  . metFORMIN  (GLUCOPHAGE-XR) 500 MG 24 hr tablet Take 1,000 mg by mouth.  . Multiple Vitamin (MULTI-VITAMINS) TABS Take by mouth.  . naproxen (NAPROSYN) 250 MG tablet Take 250 mg by mouth as needed.   . pantoprazole (PROTONIX) 40 MG tablet Take 40 mg by mouth daily.  . potassium chloride SA (K-DUR,KLOR-CON) 20 MEQ tablet Take 20 mEq by mouth daily.   . pravastatin (PRAVACHOL) 20 MG tablet Take 20 mg by mouth at bedtime. Reported on 12/13/2015  . Turmeric Curcumin 500 MG CAPS Take by mouth daily.    No current facility-administered medications on file prior to visit.    ROS  Constitutional: Denies any fever or chills Gastrointestinal: No reported hemesis, hematochezia, vomiting, or acute GI distress Musculoskeletal: Denies any acute onset joint swelling, redness, loss of ROM, or weakness Neurological: No reported episodes of acute onset apraxia, aphasia, dysarthria, agnosia, amnesia, paralysis, loss of coordination, or loss of consciousness  Allergies  Mariah Poole is allergic to contrast media [iodinated diagnostic agents].  PFSH  Drug: Mariah Poole  reports that she uses drugs. Alcohol:  reports that she does not drink alcohol. Tobacco:  reports that she has never smoked. She does not have any smokeless tobacco history on file. Medical:  has a past medical  history of Anemia; Arthritis; COPD (chronic obstructive pulmonary disease) ([place]); Depression; Diabetes mellitus without complication ([place]); GERD (gastroesophageal reflux disease); Hiatal hernia; History of contrast media allergy. (IVP dye) (09/01/2015); History of hiatal hernia; Hypercholesteremia; Hypertension; Morbid obesity ([place]); Scoliosis; and Shortness of breath dyspnea. Family: family history includes Diabetes in her mother; Heart disease in her father.  Past Surgical History:  Procedure Laterality Date  . ABDOMINAL HYSTERECTOMY    . CATARACT EXTRACTION W/ INTRAOCULAR LENS IMPLANT Right   . CATARACT EXTRACTION W/PHACO Left 07/19/2015    Procedure: CATARACT EXTRACTION PHACO AND INTRAOCULAR LENS PLACEMENT (IOC);  Surgeon: Birder Robson, MD;  Location: [facility] ORS;  Service: Ophthalmology;  Laterality: Left;  [place]: 00:39.5 AP%: 24.4 CDE: 9.66 Lot #[id number]  . CHOLECYSTECTOMY    . GASTRIC BYPASS     with partial reversal and incisional hernia repair  . HERNIA REPAIR     Constitutional Exam  General appearance: Well nourished, well developed, and well hydrated. In no apparent acute distress Vitals:   12/04/16 0927  BP: 119/63  Pulse: 86  Resp: 16  Temp: 98.3 F (36.8 C)  TempSrc: Oral  SpO2: 99%  Weight: 204 lb (92.5 kg)  Height: 5' (1.524 m)   BMI Assessment: Estimated body mass index is 39.84 kg/m as calculated from the following:   Height as of this encounter: 5' (1.524 m).   Weight as of this encounter: 204 lb (92.5 kg).  BMI interpretation table: BMI level Category Range association with higher incidence of chronic pain  <18 kg/m2 Underweight   18.5-24.9 kg/m2 Ideal body weight   25-29.9 kg/m2 Overweight Increased incidence by 20%  30-34.9 kg/m2 Obese (Class I) Increased incidence by 68%  35-39.9 kg/m2 Severe obesity (Class II) Increased incidence by 136%  >40 kg/m2 Extreme obesity (Class III) Increased incidence by 254%   BMI Readings from Last 4 Encounters:  12/04/16 39.84 kg/m  09/03/16 40.04 kg/m  05/09/16 39.45 kg/m  03/15/16 39.65 kg/m   Wt Readings from Last 4 Encounters:  12/04/16 204 lb (92.5 kg)  09/03/16 205 lb (93 kg)  05/09/16 202 lb (91.6 kg)  03/15/16 203 lb (92.1 kg)  Psych/Mental status: Alert, oriented x 3 (person, place, & time) Eyes: PERLA Respiratory: No evidence of acute respiratory distress  Cervical Spine Exam  Inspection: No masses, redness, or swelling Alignment: Symmetrical Functional ROM: Unrestricted ROM Stability: No instability detected Muscle strength & Tone: Functionally intact Sensory: Unimpaired Palpation: Non-contributory  Upper Extremity (UE) Exam     Side: Right upper extremity  Side: Left upper extremity  Inspection: No masses, redness, swelling, or asymmetry  Inspection: No masses, redness, swelling, or asymmetry  Functional ROM: Unrestricted ROM          Functional ROM: Unrestricted ROM          Muscle strength & Tone: Functionally intact  Muscle strength & Tone: Functionally intact  Sensory: Unimpaired  Sensory: Unimpaired  Palpation: Non-contributory  Palpation: Non-contributory   Thoracic Spine Exam  Inspection: No masses, redness, or swelling Alignment: Symmetrical Functional ROM: Unrestricted ROM Stability: No instability detected Sensory: Unimpaired Muscle strength & Tone: Functionally intact Palpation: Non-contributory  Lumbar Spine Exam  Inspection: Thoraco-lumbar Scoliosis Alignment: Symmetrical Functional ROM: Unrestricted ROM Stability: No instability detected Muscle strength & Tone: Functionally intact Sensory: Unimpaired Palpation: Non-contributory Provocative Tests: Lumbar Hyperextension and rotation test: evaluation deferred today       Patrick's Maneuver: evaluation deferred today              Gait &  Posture Assessment  Ambulation: Patient ambulates using a cane Gait: Limited. Using assistive device to ambulate Posture: Antalgic   Lower Extremity Exam    Side: Right lower extremity  Side: Left lower extremity  Inspection: No masses, redness, swelling, or asymmetry  Inspection: No masses, redness, swelling, or asymmetry  Functional ROM: Unrestricted ROM          Functional ROM: Unrestricted ROM          Muscle strength & Tone: Functionally intact  Muscle strength & Tone: Functionally intact  Sensory: Unimpaired  Sensory: Unimpaired  Palpation: Non-contributory  Palpation: Non-contributory   Assessment  Primary Diagnosis & Pertinent Problem List: The primary encounter diagnosis was Chronic pain syndrome. Diagnoses of Chronic low back pain (Location of Primary Source of Pain) (Bilateral) (R>L), Long  term prescription opiate use, and Opiate use (30 MME/Day) were also pertinent to this visit.  Status Diagnosis  Controlled Controlled Controlled 1. Chronic pain syndrome   2. Chronic low back pain (Location of Primary Source of Pain) (Bilateral) (R>L)   3. Long term prescription opiate use   4. Opiate use (30 MME/Day)      Plan of Care  Pharmacotherapy (Medications Ordered): Meds ordered this encounter  Medications  . HYDROcodone-acetaminophen (NORCO) 10-325 MG tablet    Sig: Take 1 tablet by mouth every 8 (eight) hours as needed for severe pain.    Dispense:  90 tablet    Refill:  0    Do not place this medication, or any other prescription from our practice, on "Automatic Refill". Patient may have prescription filled one day early if pharmacy is closed on scheduled refill date. Do not fill until: 12/10/16 To last until: 01/09/17  . HYDROcodone-acetaminophen (NORCO) 10-325 MG tablet    Sig: Take 1 tablet by mouth every 8 (eight) hours as needed for severe pain.    Dispense:  90 tablet    Refill:  0    Do not place this medication, or any other prescription from our practice, on "Automatic Refill". Patient may have prescription filled one day early if pharmacy is closed on scheduled refill date. Do not fill until: 01/09/17 To last until: 02/08/17  . HYDROcodone-acetaminophen (NORCO) 10-325 MG tablet    Sig: Take 1 tablet by mouth every 8 (eight) hours as needed for severe pain.    Dispense:  90 tablet    Refill:  0    Do not place this medication, or any other prescription from our practice, on "Automatic Refill". Patient may have prescription filled one day early if pharmacy is closed on scheduled refill date. Do not fill until: 02/08/17 To last until: 03/10/17   New Prescriptions   No medications on file   Medications administered today: Mariah Poole had no medications administered during this visit. Lab-work, procedure(s), and/or referral(s): Orders Placed This Encounter   Procedures  . ToxASSURE Select 13 (MW), Urine   Imaging and/or referral(s): None  Interventional therapies: Planned, scheduled, and/or pending:   Not at this time.   Considering:   Palliative lumbar epidural steroid injection under fluoroscopic guidance and IV sedation Palliative bilateral lumbar facet block under fluoroscopic guidance and IV sedation.  Palliative intra-articular hip joint injection under fluoroscopic guidance and IV sedation.    Palliative PRN treatment(s):   Palliative bilateral lumbar facet block under fluoroscopic guidance and IV sedation.  Palliative intra-articular hip joint injection under fluoroscopic guidance and IV sedation.    Provider-requested follow-up: Return in about 3 months (around 03/04/2017) for (MD) Med-Mgmt.  Future Appointments Date Time Provider [place]  02/28/2017 9:30 AM Milinda Pointer, MD [facility] None   Primary Care Physician: Marguerita Merles, MD Location: [facility] Note by: Kathlen Brunswick. Dossie Arbour, M.D, DABA, DABAPM, DABPM, DABIPP, FIPP Date: 12/04/2016; Time: 10:27 AM  Pain Score Disclaimer: We use the NRS-11 scale. This is a self-reported, subjective measurement of pain severity with only modest accuracy. It is used primarily to identify changes within a particular patient. It must be understood that outpatient pain scales are significantly less accurate that those used for research, where they can be applied under ideal controlled circumstances with minimal exposure to variables. In reality, the score is likely to be a combination of pain intensity and pain affect, where pain affect describes the degree of emotional arousal or changes in action readiness caused by the sensory experience of pain. Factors such as social and work situation, setting, emotional state, anxiety levels, expectation, and prior pain experience may influence pain perception and show large inter-individual differences that may  also be affected by time variables.  Patient instructions provided during this appointment: Patient Instructions  Post-op Pain Management on a Chronic Pain Patient  Why should the surgeon manage his patient's post-op pain? The Surgeon is uniquely qualified to determine the amount of post-operative pain to be expected on a procedure or surgery that he/she has performed. Even with similar surgeries, the surgeon's perspective on expected pain is unique, since he/she performed the procedure and knows the degree of difficulty and/or tissue damage involved in each particular case. The surgeon is also up to date on events such as blood loss, intraoperative complications, and PO (per orum) status that may influence not only the patient's dose and schedule, but route of administration as well.  How about telling chronic pain patients to just double up or increase their usual pain medication intake to compensate for the increased pain? This is a bad idea since it will lead to the patient running out of his/her usual medications early and this may create a problem at the level of the insurance, which supplies medications based on the amount and schedule stated on the prescription. Running out early may trigger an event where the refill is denied by the insurance company and/or pharmacy. In addition, this practice provides a very poor paper trail as to why this patient ran out of medication early. In addition, from the perspective of the pain physician, it creates a nightmare in the accounting of the patient's medication.  So, what should I do as a Psychologist, sport and exercise when confronted with a patient that needs surgery and already takes a significant amount of pain medicine for their chronic pain, which may or may not be related to the surgery I have to perform?  This is what the surgeon should do: 1. Do not change the dose or schedule of the pain medications prescribed by the pain specialist. This medication regimen allows  for the patient's chronic pain to be under control, so as to bring that patient down to the level of an average individual. 2. Have the patient continue their usual pain management regimen, without any alterations. In addition, manage the post-operative pain as you would on any other "narcotic naive" patient. Do not attempt to compensate for tolerance. This is what the patient's usual regimen will do for you. Simply treat the patient as if they had no chronic pain and as if they were taking no other pain medications. 3. Talk to the patient about the medication,  just like you would for anyone else. Do not assume that they are experts in opioids. Make sure you let the patient know that the medication is to be used only if absolutely necessary. (PRN) 4. Prescribe the medication for as long as you would on any other patient undergoing the same type of surgery. Prescribe for the same average amount of time that you would on any other patient. Avoid prescribing for longer periods. 5. Send [place] a copy of the operative report with information about your choice of the post-op pain medication provided. 6. Keep [place] informed of any complications that may prolong the average duration patient's post-op pain.  If you have any questions, please feel to contact us at [phone].

## 2016-12-04 NOTE — Progress Notes (Signed)
Nursing Pain Medication Assessment:  Safety precautions to be maintained throughout the outpatient stay will include: orient to surroundings, keep bed in low position, maintain call bell within reach at all times, provide assistance with transfer out of bed and ambulation.  Medication Inspection Compliance: Pill count conducted under aseptic conditions, in front of the patient. Neither the pills nor the bottle was removed from the patient's sight at any time. Once count was completed pills were immediately returned to the patient in their original bottle.  Medication: Hydrocodone/APAP Pill Count: 28 of 90 pills remain Bottle Appearance: Standard pharmacy container. Clearly labeled. Filled Date: 53 / 22/ 2017 Medication last intake: 01//16/2018

## 2016-12-10 LAB — TOXASSURE SELECT 13 (MW), URINE

## 2016-12-21 DIAGNOSIS — K430 Incisional hernia with obstruction, without gangrene: Secondary | ICD-10-CM | POA: Insufficient documentation

## 2017-02-28 ENCOUNTER — Ambulatory Visit: Payer: Medicare Other | Admitting: Pain Medicine

## 2017-03-07 ENCOUNTER — Ambulatory Visit: Payer: Medicare Other | Attending: Pain Medicine | Admitting: Pain Medicine

## 2017-03-07 ENCOUNTER — Encounter: Payer: Self-pay | Admitting: Pain Medicine

## 2017-03-07 VITALS — BP 118/87 | HR 80 | Temp 98.7°F | Resp 16 | Ht 60.0 in | Wt 194.0 lb

## 2017-03-07 DIAGNOSIS — M47816 Spondylosis without myelopathy or radiculopathy, lumbar region: Secondary | ICD-10-CM

## 2017-03-07 DIAGNOSIS — M545 Low back pain: Secondary | ICD-10-CM | POA: Diagnosis not present

## 2017-03-07 DIAGNOSIS — Z9889 Other specified postprocedural states: Secondary | ICD-10-CM | POA: Diagnosis not present

## 2017-03-07 DIAGNOSIS — J449 Chronic obstructive pulmonary disease, unspecified: Secondary | ICD-10-CM | POA: Insufficient documentation

## 2017-03-07 DIAGNOSIS — Z79891 Long term (current) use of opiate analgesic: Secondary | ICD-10-CM | POA: Diagnosis not present

## 2017-03-07 DIAGNOSIS — G8929 Other chronic pain: Secondary | ICD-10-CM

## 2017-03-07 DIAGNOSIS — G894 Chronic pain syndrome: Secondary | ICD-10-CM

## 2017-03-07 DIAGNOSIS — I10 Essential (primary) hypertension: Secondary | ICD-10-CM | POA: Diagnosis not present

## 2017-03-07 DIAGNOSIS — Z9884 Bariatric surgery status: Secondary | ICD-10-CM | POA: Insufficient documentation

## 2017-03-07 DIAGNOSIS — E7800 Pure hypercholesterolemia, unspecified: Secondary | ICD-10-CM | POA: Insufficient documentation

## 2017-03-07 DIAGNOSIS — Z5181 Encounter for therapeutic drug level monitoring: Secondary | ICD-10-CM | POA: Insufficient documentation

## 2017-03-07 DIAGNOSIS — K449 Diaphragmatic hernia without obstruction or gangrene: Secondary | ICD-10-CM | POA: Diagnosis not present

## 2017-03-07 DIAGNOSIS — Z90710 Acquired absence of both cervix and uterus: Secondary | ICD-10-CM | POA: Insufficient documentation

## 2017-03-07 DIAGNOSIS — K219 Gastro-esophageal reflux disease without esophagitis: Secondary | ICD-10-CM | POA: Diagnosis not present

## 2017-03-07 DIAGNOSIS — D649 Anemia, unspecified: Secondary | ICD-10-CM | POA: Diagnosis not present

## 2017-03-07 DIAGNOSIS — F1190 Opioid use, unspecified, uncomplicated: Secondary | ICD-10-CM

## 2017-03-07 DIAGNOSIS — M419 Scoliosis, unspecified: Secondary | ICD-10-CM | POA: Diagnosis not present

## 2017-03-07 DIAGNOSIS — M4696 Unspecified inflammatory spondylopathy, lumbar region: Secondary | ICD-10-CM | POA: Diagnosis not present

## 2017-03-07 DIAGNOSIS — F329 Major depressive disorder, single episode, unspecified: Secondary | ICD-10-CM | POA: Insufficient documentation

## 2017-03-07 DIAGNOSIS — M25551 Pain in right hip: Secondary | ICD-10-CM | POA: Diagnosis not present

## 2017-03-07 DIAGNOSIS — Z79899 Other long term (current) drug therapy: Secondary | ICD-10-CM | POA: Insufficient documentation

## 2017-03-07 DIAGNOSIS — E119 Type 2 diabetes mellitus without complications: Secondary | ICD-10-CM | POA: Insufficient documentation

## 2017-03-07 DIAGNOSIS — M47896 Other spondylosis, lumbar region: Secondary | ICD-10-CM | POA: Insufficient documentation

## 2017-03-07 DIAGNOSIS — Z9049 Acquired absence of other specified parts of digestive tract: Secondary | ICD-10-CM | POA: Insufficient documentation

## 2017-03-07 MED ORDER — HYDROCODONE-ACETAMINOPHEN 10-325 MG PO TABS: 1.0000 | ORAL_TABLET | Freq: Three times a day (TID) | ORAL | 0 refills | Status: DC | PRN

## 2017-03-07 NOTE — Progress Notes (Signed)
Nursing Pain Medication Assessment:  Safety precautions to be maintained throughout the outpatient stay will include: orient to surroundings, keep bed in low position, maintain call bell within reach at all times, provide assistance with transfer out of bed and ambulation.  Medication Inspection Compliance: Pill count conducted under aseptic conditions, in front of the patient. Neither the pills nor the bottle was removed from the patient's sight at any time. Once count was completed pills were immediately returned to the patient in their original bottle. Pill Count: 4 of 90 pills remain Bottle Appearance: Standard pharmacy container. Clearly labeled. Medication: See above Filled Date: 03 / 23 / 2017

## 2017-03-07 NOTE — Progress Notes (Signed)
Patient's Name: Mariah Poole  MRN: [id number]  Referring Provider: Marguerita Merles, MD  DOB: [birth year]  PCP: Marguerita Merles, MD  DOS: 03/07/2017  Note by: Kathlen Brunswick. Dossie Arbour, MD  Service setting: Ambulatory outpatient  Specialty: Interventional Pain Management  Location: [facility] (AMB) Pain Management Facility    Patient type: Established   Primary Reason(s) for Visit: Encounter for prescription drug management (Level of Poole: moderate) CC: Back Pain (lower)  HPI  Mariah Poole is a 71 y.o. year old, female patient, who comes today for a medication management evaluation. She has Encounter for therapeutic drug level monitoring; Long term current use of opiate analgesic; Opiate use (30 MME/Day); Chronic pain syndrome; Chronic hip pain (Right); Opiate analgesic use agreement exists; Sacral pain; Morbid obesity ([place]); History of carpal tunnel syndrome, right side; Rheumatoid arthritis ([place]); Depression; Opioid-induced constipation; History of contrast media allergy. (IVP dye); Diabetes mellitus ([place]); BP (high blood pressure); Other specified health status; Bariatric surgery status; History of cholecystectomy; Long term prescription opiate use; Allergic to radiographic contrast media; Constipation due to opioid therapy; Administrative encounter; Arthralgia of hip (Right); H/O disease; Prolapse of vaginal vault after hysterectomy; Hieralgia; Chronic low back pain (Location of Primary Source of Pain) (Bilateral) (R>L); Lumbar spondylosis; Other partial intestinal obstruction ([place]); Ventral incisional hernia with obstruction; Patient is Jehovah's Witness; S/P gastric bypass; and Lumbar facet joint syndrome ([place]) on her problem list. Her primarily concern today is the Back Pain (lower)  Pain Assessment: Self-Reported Pain Score: 0-No pain/10             Reported level is compatible with observation.          Mariah Poole was last scheduled for an appointment on 12/04/2016 for medication management. During today's  appointment we reviewed Mariah Poole's chronic pain status, as well as her outpatient medication regimen.  The patient  reports that she uses drugs. Her body mass index is 37.89 kg/m.  Further details on both, my assessment(s), as well as the proposed treatment plan, please see below.  Controlled Substance Pharmacotherapy Assessment REMS (Poole Evaluation and Mitigation Strategy)  Analgesic:Hydrocodone/APAP 10/325 one every 8 hours PRN (30 mg/day) MME/day:30 mg/day Carmelia Bake  03/07/2017  8:12 AM  Sign at close encounter Nursing Pain Medication Assessment:  Safety precautions to be maintained throughout the outpatient stay will include: orient to surroundings, keep bed in low position, maintain call bell within reach at all times, provide assistance with transfer out of bed and ambulation.  Medication Inspection Compliance: Pill count conducted under aseptic conditions, in front of the patient. Neither the pills nor the bottle was removed from the patient's sight at any time. Once count was completed pills were immediately returned to the patient in their original bottle. Pill Count: 4 of 90 pills remain Bottle Appearance: Standard pharmacy container. Clearly labeled. Medication: See above Filled Date: 03 / 23 / 2017   Pharmacokinetics: Liberation and absorption (onset of action): WNL Distribution (time to peak effect): WNL Metabolism and excretion (duration of action): WNL         Pharmacodynamics: Desired effects: Analgesia: Mariah Poole reports >50% benefit. Functional ability: Patient reports that medication allows her to accomplish basic ADLs Clinically meaningful improvement in function (CMIF): Sustained CMIF goals met Perceived effectiveness: Described as relatively effective, allowing for increase in activities of daily living (ADL) Undesirable effects: Side-effects or Adverse reactions: None reported Monitoring: [place] PMP: Online review of the past 63-monthperiod  conducted. Compliant with practice rules and regulations List of  all UDS test(s) done:  Lab Results  Component Value Date   TOXASSSELUR FINAL 12/04/2016   TOXASSSELUR FINAL 03/15/2016   TOXASSSELUR FINAL 12/13/2015   Last UDS on record: ToxAssure Select 13  Date Value Ref Range Status  12/04/2016 FINAL  Final    Comment:    ==================================================================== TOXASSURE SELECT 13 (MW) ==================================================================== Test                             Result       Flag       Units Drug Present and Declared for Prescription Verification   Hydrocodone                    1947         EXPECTED   ng/mg creat   Hydromorphone                  100          EXPECTED   ng/mg creat   Dihydrocodeine                 237          EXPECTED   ng/mg creat   Norhydrocodone                 1204         EXPECTED   ng/mg creat    Sources of hydrocodone include scheduled prescription    medications. Hydromorphone, dihydrocodeine and norhydrocodone are    expected metabolites of hydrocodone. Hydromorphone and    dihydrocodeine are also available as scheduled prescription    medications. ==================================================================== Test                      Result    Flag   Units      Ref Range   Creatinine              79               mg/dL      >=20 ==================================================================== Declared Medications:  The flagging and interpretation on this report are based on the  following declared medications.  Unexpected results may arise from  inaccuracies in the declared medications.  **Note: The testing scope of this panel includes these medications:  Hydrocodone (Norco)  **Note: The testing scope of this panel does not include following  reported medications:  Acetaminophen (Norco)  Atenolol  Duloxetine (Cymbalta)  Furosemide (Lasix)  Losartan (Cozaar)  Magnesium (Mag-Ox)   Metformin  Naproxen (Naprosyn)  Ondansetron  Pantoprazole (Protonix)  Potassium (Klor-Con)  Pravastatin (Pravachol)  Turmeric (Tumeric Root)  Vitamin D3 ==================================================================== For clinical consultation, please call [phone]. ====================================================================    UDS interpretation: Compliant          Medication Assessment Form: Reviewed. Patient indicates being compliant with therapy Treatment compliance: Compliant Poole Assessment Profile: Aberrant behavior: See prior evaluations. None observed or detected today Comorbid factors increasing Poole of overdose: See prior notes. No additional risks detected today Poole of substance use disorder (SUD): Low Opioid Poole Tool (ORT) Total Score: 0  Interpretation Table:  Score <3 = Low Poole for SUD  Score between 4-7 = Moderate Poole for SUD  Score >8 = High Poole for Opioid Abuse   Poole Mitigation Strategies:  Patient Counseling: Covered Patient-Prescriber Agreement (PPA): Present and active  Notification to other healthcare providers: Done  Pharmacologic Plan: No change  in therapy, at this time  Laboratory Chemistry  Inflammation Markers Lab Results  Component Value Date   ESRSEDRATE 37 (H) 01/06/2013   (CRP: Acute Phase) (ESR: Chronic Phase) Renal Function Markers Lab Results  Component Value Date   BUN 23 (H) 10/27/2015   CREATININE 0.93 10/27/2015   GFRAA >60 10/27/2015   GFRNONAA >60 10/27/2015   Hepatic Function Markers Lab Results  Component Value Date   AST 20 10/27/2015   ALT 16 10/27/2015   ALBUMIN 4.0 10/27/2015   ALKPHOS 83 10/27/2015   Electrolytes Lab Results  Component Value Date   NA 138 10/27/2015   K 3.3 (L) 10/27/2015   CL 95 (L) 10/27/2015   CALCIUM 9.5 10/27/2015   MG 1.4 (L) 12/16/2012   Neuropathy Markers No results found for: [id number] Bone Pathology Markers Lab Results  Component Value Date    ALKPHOS 83 10/27/2015   CALCIUM 9.5 10/27/2015   Coagulation Parameters Lab Results  Component Value Date   INR 0.94 11/23/2009   LABPROT 12.5 11/23/2009   APTT 20 (L) 11/23/2009   PLT 346 10/27/2015   Cardiovascular Markers Lab Results  Component Value Date   BNP 265 (H) 12/16/2012   HGB 13.7 10/27/2015   HCT 43.1 10/27/2015   Note: Lab results reviewed. More up-to-date labs seen on "care everywhere" [facility].  Recent Diagnostic Imaging Review  Dg Chest 2 View Result Date: 10/27/2015 CLINICAL DATA:  Chest pain EXAM: CHEST  2 VIEW COMPARISON:  12/16/2012 FINDINGS: There is no edema, consolidation, effusion, or pneumothorax. Normal heart size and stable aortic tortuosity. Moderate hiatal hernia with gastric surgical clips. IMPRESSION: 1. No acute finding. 2. Moderate hiatal hernia. Electronically Signed   By: Monte Fantasia M.D.   On: 10/27/2015 15:21   Note: Imaging results reviewed.          Meds  The patient has a current medication list which includes the following prescription(s): accu-chek aviva plus, accu-chek softclix lancets, albuterol, atenolol, accu-chek aviva plus, vitamin d3, duloxetine, furosemide, hydrocodone-acetaminophen, hydrocodone-acetaminophen, hydrocodone-acetaminophen, losartan, magnesium oxide, multi-vitamins, naproxen, ondansetron, pantoprazole, potassium chloride sa, pravastatin, turmeric curcumin, and vitamin c.  Current Outpatient Prescriptions on File Prior to Visit  Medication Sig  . ACCU-CHEK AVIVA PLUS test strip DX E11.9, DAILY TESTING  . ACCU-CHEK SOFTCLIX LANCETS lancets USE AS NEEDED FOR BLOOD GLUOCOSE MONITORING CHECKING DAILY  . albuterol (PROVENTIL HFA;VENTOLIN HFA) 108 (90 BASE) MCG/ACT inhaler Inhale 2 puffs into the lungs every 4 (four) hours as needed for wheezing or shortness of breath.  Marland Kitchen atenolol (TENORMIN) 25 MG tablet Take 25 mg by mouth daily.   . Blood Glucose Monitoring Suppl (ACCU-CHEK AVIVA PLUS) w/Device KIT DX E11.9 CHECK GLUCOSE  DAILY  . Cholecalciferol (VITAMIN D3) 2000 UNITS TABS Take 1 tablet by mouth daily.  . DULoxetine (CYMBALTA) 30 MG capsule Take 30 mg by mouth 2 (two) times daily.  . furosemide (LASIX) 20 MG tablet Take 20 mg by mouth daily.  Marland Kitchen losartan (COZAAR) 25 MG tablet Take 25 mg by mouth daily.  . magnesium oxide (MAG-OX) 400 (241.3 Mg) MG tablet TAKE 1 BY MOUTH ONCE A DAY, DO NOT TAKE WHILE TAKING ANY ANTIBIOTICS DR. Elizbeth Squires [phone]  . Multiple Vitamin (MULTI-VITAMINS) TABS Take by mouth.  . naproxen (NAPROSYN) 250 MG tablet Take 250 mg by mouth as needed.   . ondansetron (ZOFRAN-ODT) 4 MG disintegrating tablet TAKE 1 TABLET (4 MG TOTAL) BY MOUTH EVERY SIX (6) HOURS AS NEEDED. FOR UP TO 7 DAYS  . pantoprazole (  PROTONIX) 40 MG tablet Take 40 mg by mouth daily.  . potassium chloride SA (K-DUR,KLOR-CON) 20 MEQ tablet Take 20 mEq by mouth daily.   . pravastatin (PRAVACHOL) 20 MG tablet Take 20 mg by mouth at bedtime. Reported on 12/13/2015  . Turmeric Curcumin 500 MG CAPS Take by mouth daily.   . vitamin C (ASCORBIC ACID) 500 MG tablet Take 500 mg by mouth daily.   No current facility-administered medications on file prior to visit.    ROS  Constitutional: Denies any fever or chills Gastrointestinal: No reported hemesis, hematochezia, vomiting, or acute GI distress Musculoskeletal: Denies any acute onset joint swelling, redness, loss of ROM, or weakness Neurological: No reported episodes of acute onset apraxia, aphasia, dysarthria, agnosia, amnesia, paralysis, loss of coordination, or loss of consciousness  Allergies  Mariah Poole is allergic to contrast media [iodinated diagnostic agents].  PFSH  Drug: Mariah Poole  reports that she uses drugs. Alcohol:  reports that she does not drink alcohol. Tobacco:  reports that she has never smoked. She does not have any smokeless tobacco history on file. Medical:  has a past medical history of Anemia; Arthritis; COPD (chronic obstructive pulmonary  disease) ([place]); Depression; Diabetes mellitus without complication ([place]); GERD (gastroesophageal reflux disease); Hiatal hernia; History of contrast media allergy. (IVP dye) (09/01/2015); History of hiatal hernia; Hypercholesteremia; Hypertension; Morbid obesity ([place]); Scoliosis; and Shortness of breath dyspnea. Family: family history includes Diabetes in her mother; Heart disease in her father.  Past Surgical History:  Procedure Laterality Date  . ABDOMINAL HYSTERECTOMY    . CATARACT EXTRACTION W/ INTRAOCULAR LENS IMPLANT Right   . CATARACT EXTRACTION W/PHACO Left 07/19/2015   Procedure: CATARACT EXTRACTION PHACO AND INTRAOCULAR LENS PLACEMENT (IOC);  Surgeon: Birder Robson, MD;  Location: [facility] ORS;  Service: Ophthalmology;  Laterality: Left;  [place]: 00:39.5 AP%: 24.4 CDE: 9.66 Lot #[id number]  . CHOLECYSTECTOMY    . GASTRIC BYPASS     with partial reversal and incisional hernia repair  . HERNIA REPAIR    . HERNIA REPAIR     Constitutional Exam  General appearance: Well nourished, well developed, and well hydrated. In no apparent acute distress Vitals:   03/07/17 0801  BP: 118/87  Pulse: 80  Resp: 16  Temp: 98.7 F (37.1 C)  SpO2: 100%  Weight: 194 lb (88 kg)  Height: 5' (1.524 m)   BMI Assessment: Estimated body mass index is 37.89 kg/m as calculated from the following:   Height as of this encounter: 5' (1.524 m).   Weight as of this encounter: 194 lb (88 kg).  BMI interpretation table: BMI level Category Range association with higher incidence of chronic pain  <18 kg/m2 Underweight   18.5-24.9 kg/m2 Ideal body weight   25-29.9 kg/m2 Overweight Increased incidence by 20%  30-34.9 kg/m2 Obese (Class I) Increased incidence by 68%  35-39.9 kg/m2 Severe obesity (Class II) Increased incidence by 136%  >40 kg/m2 Extreme obesity (Class III) Increased incidence by 254%   BMI Readings from Last 4 Encounters:  03/07/17 37.89 kg/m  12/04/16 39.84 kg/m  09/03/16 40.04 kg/m   05/09/16 39.45 kg/m   Wt Readings from Last 4 Encounters:  03/07/17 194 lb (88 kg)  12/04/16 204 lb (92.5 kg)  09/03/16 205 lb (93 kg)  05/09/16 202 lb (91.6 kg)  Psych/Mental status: Alert, oriented x 3 (person, place, & time)       Eyes: PERLA Respiratory: No evidence of acute respiratory distress  Cervical Spine Exam  Inspection: No masses, redness, or  swelling Alignment: Symmetrical Functional ROM: Unrestricted ROM Stability: No instability detected Muscle strength & Tone: Functionally intact Sensory: Unimpaired Palpation: No palpable anomalies              Upper Extremity (UE) Exam    Side: Right upper extremity  Side: Left upper extremity  Inspection: No masses, redness, swelling, or asymmetry. No contractures  Inspection: No masses, redness, swelling, or asymmetry. No contractures  Functional ROM: Unrestricted ROM          Functional ROM: Unrestricted ROM          Muscle strength & Tone: Functionally intact  Muscle strength & Tone: Functionally intact  Sensory: Unimpaired  Sensory: Unimpaired  Palpation: No palpable anomalies              Palpation: No palpable anomalies              Specialized Test(s): Deferred         Specialized Test(s): Deferred          Thoracic Spine Exam  Inspection: No masses, redness, or swelling Alignment: Symmetrical Functional ROM: Unrestricted ROM Stability: No instability detected Sensory: Unimpaired Muscle strength & Tone: No palpable anomalies  Lumbar Spine Exam  Inspection: Thoraco-lumbar Scoliosis Alignment: Symmetrical Functional ROM: Decreased ROM Stability: No instability detected Muscle strength & Tone: Functionally intact Sensory: Movement-associated discomfort Palpation: No palpable anomalies Provocative Tests: Lumbar Hyperextension and rotation test: evaluation deferred today       Patrick's Maneuver: evaluation deferred today              Gait & Posture Assessment  Ambulation: Patient ambulates using a cane Gait:  Limited. Using assistive device to ambulate Posture: Antalgic   Lower Extremity Exam    Side: Right lower extremity  Side: Left lower extremity  Inspection: No masses, redness, swelling, or asymmetry. No contractures  Inspection: No masses, redness, swelling, or asymmetry. No contractures  Functional ROM: Unrestricted ROM          Functional ROM: Unrestricted ROM          Muscle strength & Tone: Functionally intact  Muscle strength & Tone: Functionally intact  Sensory: Unimpaired  Sensory: Unimpaired  Palpation: No palpable anomalies  Palpation: No palpable anomalies   Assessment  Primary Diagnosis & Pertinent Problem List: The primary encounter diagnosis was Chronic low back pain (Location of Primary Source of Pain) (Bilateral) (R>L). Diagnoses of Chronic pain syndrome, Chronic hip pain (Right), Pain of right hip joint, Long term prescription opiate use, Opiate use (30 MME/Day), Lumbar facet joint syndrome ([place]), and Lumbar spondylosis were also pertinent to this visit.  Status Diagnosis  Controlled Controlled Controlled 1. Chronic low back pain (Location of Primary Source of Pain) (Bilateral) (R>L)   2. Chronic pain syndrome   3. Chronic hip pain (Right)   4. Pain of right hip joint   5. Long term prescription opiate use   6. Opiate use (30 MME/Day)   7. Lumbar facet joint syndrome ([place])   8. Lumbar spondylosis      Plan of Care  Pharmacotherapy (Medications Ordered): Meds ordered this encounter  Medications  . HYDROcodone-acetaminophen (NORCO) 10-325 MG tablet    Sig: Take 1 tablet by mouth every 8 (eight) hours as needed for severe pain.    Dispense:  90 tablet    Refill:  0    Do not place this medication, or any other prescription from our practice, on "Automatic Refill". Patient may have prescription filled one day early if  pharmacy is closed on scheduled refill date. Do not fill until: 04/09/17 To last until: 05/09/17  . HYDROcodone-acetaminophen (NORCO) 10-325 MG  tablet    Sig: Take 1 tablet by mouth every 8 (eight) hours as needed for severe pain.    Dispense:  90 tablet    Refill:  0    Do not place this medication, or any other prescription from our practice, on "Automatic Refill". Patient may have prescription filled one day early if pharmacy is closed on scheduled refill date. Do not fill until: 05/09/17 To last until: 06/08/17  . HYDROcodone-acetaminophen (NORCO) 10-325 MG tablet    Sig: Take 1 tablet by mouth every 8 (eight) hours as needed for severe pain.    Dispense:  90 tablet    Refill:  0    Do not place this medication, or any other prescription from our practice, on "Automatic Refill". Patient may have prescription filled one day early if pharmacy is closed on scheduled refill date. Do not fill until: 03/10/17 To last until: 04/09/17   New Prescriptions   No medications on file   Medications administered today: Mariah Poole had no medications administered during this visit. Lab-work, procedure(s), and/or referral(s): Orders Placed This Encounter  Procedures  . LUMBAR FACET(MEDIAL BRANCH NERVE BLOCK) MBNB  . HIP INJECTION   Imaging and/or referral(s): None  Interventional therapies: Planned, scheduled, and/or pending:   Not at this time.   Considering:   Palliative lumbar epidural steroid injection under fluoroscopic guidance and IV sedation Palliative bilateral lumbar facet block under fluoroscopic guidance and IV sedation.  Palliative intra-articular hip joint injection under fluoroscopic guidance and IV sedation.    Palliative PRN treatment(s):   Palliative bilateral lumbar facet block under fluoroscopic guidance and IV sedation.  Palliative intra-articular hip joint injection under fluoroscopic guidance and IV sedation.    Provider-requested follow-up: Return in 3 months (on 05/23/2017) for (Nurse Practitioner) Med-Mgmt.  Future Appointments Date Time Provider [place]  05/23/2017 9:00 AM [place], NP  [facility] None   Primary Care Physician: Marguerita Merles, MD Location: [facility] Outpatient Pain Management Facility Note by: Kathlen Brunswick. Dossie Arbour, M.D, DABA, DABAPM, DABPM, DABIPP, FIPP Date: 03/07/2017; Time: 3:16 PM  Patient instructions provided during this appointment: Patient Instructions  Pain Management Discharge Instructions  General Discharge Instructions :  If you need to reach your doctor call: Monday-Friday 8:00 am - 4:00 pm at [phone] or toll free [phone].  After clinic hours [phone] to have operator reach doctor.  Bring all of your medication bottles to all your appointments in the pain clinic.  To cancel or reschedule your appointment with Pain Management please remember to call 24 hours in advance to avoid a fee.  Refer to the educational materials which you have been given on: General Risks, I had my Procedure. Discharge Instructions, Post Sedation.  Post Procedure Instructions:  The drugs you were given will stay in your system until tomorrow, so for the next 24 hours you should not drive, make any legal decisions or drink any alcoholic beverages.  You may eat anything you prefer, but it is better to start with liquids then soups and crackers, and gradually work up to solid foods.  Please notify your doctor immediately if you have any unusual bleeding, trouble breathing or pain that is not related to your normal pain.  Depending on the type of procedure that was done, some parts of your body may feel week and/or numb.  This usually clears up by  tonight or the next day.  Walk with the use of an assistive device or accompanied by an adult for the 24 hours.  You may use ice on the affected area for the first 24 hours.  Put ice in a Ziploc bag and cover with a towel and place against area 15 minutes on 15 minutes off.  You may switch to heat after 24 hours.

## 2017-03-07 NOTE — Patient Instructions (Addendum)
Pain Management Discharge Instructions  General Discharge Instructions :  If you need to reach your doctor call: Monday-Friday 8:00 am - 4:00 pm at [phone] or toll free [phone].  After clinic hours [phone] to have operator reach doctor.  Bring all of your medication bottles to all your appointments in the pain clinic.  To cancel or reschedule your appointment with Pain Management please remember to call 24 hours in advance to avoid a fee.  Refer to the educational materials which you have been given on: General Risks, I had my Procedure. Discharge Instructions, Post Sedation.  Post Procedure Instructions:  The drugs you were given will stay in your system until tomorrow, so for the next 24 hours you should not drive, make any legal decisions or drink any alcoholic beverages.  You may eat anything you prefer, but it is better to start with liquids then soups and crackers, and gradually work up to solid foods.  Please notify your doctor immediately if you have any unusual bleeding, trouble breathing or pain that is not related to your normal pain.  Depending on the type of procedure that was done, some parts of your body may feel week and/or numb.  This usually clears up by tonight or the next day.  Walk with the use of an assistive device or accompanied by an adult for the 24 hours.  You may use ice on the affected area for the first 24 hours.  Put ice in a Ziploc bag and cover with a towel and place against area 15 minutes on 15 minutes off.  You may switch to heat after 24 hours.Cesarean Delivery  Cesarean delivery is the birth of a baby through a cut (incision) in the abdomen and womb (uterus).  LET [facility] CARE PROVIDER KNOW ABOUT:  All medicines you are taking, including vitamins, herbs, eye drops, creams, and over-the-counter medicines.  Previous problems you or members of your family have had with the use of anesthetics.  Any blood disorders you  have.  Previous surgeries you have had.  Medical conditions you have.  Any allergies you have.  Complicationsinvolving the pregnancy. RISKS AND COMPLICATIONS  Generally, this is a safe procedure. However, as with any procedure, complications can occur. Possible complications include:  Bleeding.  Infection.  Blood clots.  Injury to surrounding organs.  Problems with anesthesia.  Injury to the baby. BEFORE THE PROCEDURE   You may be given an antacid medicine to drink. This will prevent acid contents in your stomach from going into your lungs if you vomit during the surgery.  You may be given an antibiotic medicine to prevent infection. PROCEDURE   Hair may be removed from your pubic area and your lower abdomen. This is to prevent infection in the incision site.  A tube (Foley catheter) will be placed in your bladder to drain your urine from your bladder into a bag. This keeps your bladder empty during surgery.  An IV tube will be placed in your vein.  You may be given medicine to numb the lower half of your body (regional anesthetic). If you were in labor, you may have already had an epidural in place which can be used in both labor and cesarean delivery. You may possibly be given medicine to make you sleep (general anesthetic) though this is not as common.  An incision will be made in your abdomen that extends to your uterus. There are 2 basic kinds of incisions:  The horizontal (transverse) incision. Horizontal incisions are  from side to side and are used for most routine cesarean deliveries.  The vertical incision. The vertical incision is from the top of the abdomen to the bottom and is less commonly used. It is often done for women who have a serious complication (extreme prematurity) or under emergency situations.  The horizontal and vertical incisions may both be used at the same time. However, this is very uncommon.  An incision is then made in your uterus to  deliver the baby.  Your baby will then be delivered.  Both incisions are then closed with absorbable stitches. AFTER THE PROCEDURE   If you were awake during the surgery, you will see your baby right away. If you were asleep, you will see your baby as soon as you are awake.  You may breastfeed your baby after surgery.  You may be able to get up and walk the same day as the surgery. If you need to stay in bed for a period of time, you will receive help to turn, cough, and take deep breaths after surgery. This helps prevent lung problems such as pneumonia.  Do not get out of bed alone the first time after surgery. You will need help getting out of bed until you are able to do this by yourself.  You may be able to shower the day after your cesarean delivery. After the bandage (dressing) is taken off the incision site, a nurse will assist you to shower if you would like help.  You will have pneumatic compression hose placed on your lower legs. This is done to prevent blood clots. When you are up and walking regularly, they will no longer be necessary.  Do not cross your legs when you sit.  Save any blood clots that you pass. If you pass a clot while on the toilet, do not flush it. Call for the nurse. Tell the nurse if you think you are bleeding too much or passing too many clots.  You will be given medicine as needed. Let your health care providers know if you are hurting. You may also be given an antibiotic to prevent an infection.  Your IV tube will be taken out when you are drinking a reasonable amount of fluids. The Foley catheter is taken out when you are up and walking.  If your blood type is Rh negative and your baby's blood type is Rh positive, you will be given a shot of anti-D immune globulin. This shot prevents you from having Rh problems with a future pregnancy. You should get the shot even if you had your tubes tied (tubal ligation).  If you are allowed to take the baby for a  walk, place the baby in the bassinet and push it. Do not carry your baby in your arms. Document Released: 11/05/2005 Document Revised: 08/26/2013 Document Reviewed: 05/27/2013 [facility] Patient Information 2015 [place], [place]. This information is not intended to replace advice given to you by your health care provider. Make sure you discuss any questions you have with your health care provider.

## 2017-03-14 ENCOUNTER — Encounter: Payer: Medicare Other | Admitting: Pain Medicine

## 2017-05-20 ENCOUNTER — Other Ambulatory Visit: Payer: Self-pay | Admitting: Nurse Practitioner

## 2017-05-20 ENCOUNTER — Encounter: Payer: Self-pay | Admitting: Nurse Practitioner

## 2017-05-20 ENCOUNTER — Ambulatory Visit: Payer: Medicare Other | Attending: Nurse Practitioner | Admitting: Nurse Practitioner

## 2017-05-20 VITALS — BP 132/70 | HR 72 | Temp 98.7°F | Resp 18 | Ht 59.0 in | Wt 190.0 lb

## 2017-05-20 DIAGNOSIS — D649 Anemia, unspecified: Secondary | ICD-10-CM | POA: Diagnosis not present

## 2017-05-20 DIAGNOSIS — E119 Type 2 diabetes mellitus without complications: Secondary | ICD-10-CM | POA: Insufficient documentation

## 2017-05-20 DIAGNOSIS — Z9049 Acquired absence of other specified parts of digestive tract: Secondary | ICD-10-CM | POA: Insufficient documentation

## 2017-05-20 DIAGNOSIS — K430 Incisional hernia with obstruction, without gangrene: Secondary | ICD-10-CM | POA: Diagnosis not present

## 2017-05-20 DIAGNOSIS — M47816 Spondylosis without myelopathy or radiculopathy, lumbar region: Secondary | ICD-10-CM | POA: Diagnosis not present

## 2017-05-20 DIAGNOSIS — M545 Low back pain: Secondary | ICD-10-CM | POA: Diagnosis not present

## 2017-05-20 DIAGNOSIS — M0579 Rheumatoid arthritis with rheumatoid factor of multiple sites without organ or systems involvement: Secondary | ICD-10-CM

## 2017-05-20 DIAGNOSIS — Z79899 Other long term (current) drug therapy: Secondary | ICD-10-CM | POA: Insufficient documentation

## 2017-05-20 DIAGNOSIS — M25551 Pain in right hip: Secondary | ICD-10-CM | POA: Insufficient documentation

## 2017-05-20 DIAGNOSIS — J449 Chronic obstructive pulmonary disease, unspecified: Secondary | ICD-10-CM | POA: Diagnosis not present

## 2017-05-20 DIAGNOSIS — K449 Diaphragmatic hernia without obstruction or gangrene: Secondary | ICD-10-CM | POA: Insufficient documentation

## 2017-05-20 DIAGNOSIS — M549 Dorsalgia, unspecified: Secondary | ICD-10-CM | POA: Diagnosis not present

## 2017-05-20 DIAGNOSIS — Z90710 Acquired absence of both cervix and uterus: Secondary | ICD-10-CM | POA: Insufficient documentation

## 2017-05-20 DIAGNOSIS — I10 Essential (primary) hypertension: Secondary | ICD-10-CM | POA: Insufficient documentation

## 2017-05-20 DIAGNOSIS — F329 Major depressive disorder, single episode, unspecified: Secondary | ICD-10-CM | POA: Diagnosis not present

## 2017-05-20 DIAGNOSIS — G894 Chronic pain syndrome: Secondary | ICD-10-CM | POA: Insufficient documentation

## 2017-05-20 DIAGNOSIS — Z79891 Long term (current) use of opiate analgesic: Secondary | ICD-10-CM | POA: Insufficient documentation

## 2017-05-20 DIAGNOSIS — G8929 Other chronic pain: Secondary | ICD-10-CM

## 2017-05-20 DIAGNOSIS — G5601 Carpal tunnel syndrome, right upper limb: Secondary | ICD-10-CM | POA: Insufficient documentation

## 2017-05-20 DIAGNOSIS — Z5181 Encounter for therapeutic drug level monitoring: Secondary | ICD-10-CM | POA: Insufficient documentation

## 2017-05-20 DIAGNOSIS — N993 Prolapse of vaginal vault after hysterectomy: Secondary | ICD-10-CM | POA: Insufficient documentation

## 2017-05-20 DIAGNOSIS — Z9884 Bariatric surgery status: Secondary | ICD-10-CM | POA: Insufficient documentation

## 2017-05-20 DIAGNOSIS — E1169 Type 2 diabetes mellitus with other specified complication: Secondary | ICD-10-CM | POA: Diagnosis not present

## 2017-05-20 DIAGNOSIS — Z9889 Other specified postprocedural states: Secondary | ICD-10-CM | POA: Insufficient documentation

## 2017-05-20 DIAGNOSIS — K5669 Other partial intestinal obstruction: Secondary | ICD-10-CM | POA: Insufficient documentation

## 2017-05-20 DIAGNOSIS — M533 Sacrococcygeal disorders, not elsewhere classified: Secondary | ICD-10-CM | POA: Diagnosis not present

## 2017-05-20 MED ORDER — HYDROCODONE-ACETAMINOPHEN 10-325 MG PO TABS: 1.0000 | ORAL_TABLET | Freq: Three times a day (TID) | ORAL | 0 refills | Status: DC | PRN

## 2017-05-20 NOTE — Progress Notes (Signed)
Patient's Name: Mariah Poole  MRN: [id number]  Referring Provider: Marguerita Merles, MD  DOB: [birth year]  PCP: Marguerita Merles, MD  DOS: 05/20/2017  Note by: Vevelyn Francois NP  Service setting: Ambulatory outpatient  Specialty: Interventional Pain Management  Location: [facility] (AMB) Pain Management Facility    Patient type: Established    Primary Reason(s) for Visit: Encounter for prescription drug management. (Level of risk: moderate)  CC: Back Pain (right lower) and Hand Pain (bilateral)  HPI  Ms. Chalupa is a 71 y.o. year old, female patient, who comes today for a medication management evaluation. She has Encounter for therapeutic drug level monitoring; Long term current use of opiate analgesic; Opiate use (30 MME/Day); Chronic pain syndrome; Chronic hip pain (Right); Opiate analgesic use agreement exists; Sacral pain; Morbid obesity ([place]); History of carpal tunnel syndrome, right side; Rheumatoid arthritis ([place]); Depression; Opioid-induced constipation; History of contrast media allergy. (IVP dye); Diabetes mellitus ([place]); Hypertension; Other specified health status; Bariatric surgery status; History of cholecystectomy; Long term prescription opiate use; Allergic to radiographic contrast media; Constipation due to opioid therapy; Administrative encounter; Arthralgia of hip (Right); H/O disease; Prolapse of vaginal vault after hysterectomy; Hieralgia; Chronic low back pain (Location of Primary Source of Pain) (Bilateral) (R>L); Lumbar spondylosis; Other partial intestinal obstruction ([place]); Ventral incisional hernia with obstruction; Patient is Jehovah's Witness; S/P gastric bypass; Lumbar facet joint syndrome ([place]); Opioid dependence ([place]); and Diabetes ([place]) on her problem list. Her primarily concern today is the Back Pain (right lower) and Hand Pain (bilateral)  Pain Assessment: Location:     Radiating:   Onset:   Duration:   Quality:   Severity: 4 /10 (self-reported pain score)  Note: Reported  level is compatible with observation.                   Effect on ADL:   Timing:   Modifying factors:    Ms. Ardelean was last scheduled for an appointment on 03/07/17 for medication management. During today's appointment we reviewed Ms. Koskinen's chronic pain status, as well as her outpatient medication regimen.She has chronic low back pain. She denies any radicular symptoms. She any numbness tingling or weakness. She states that her pain is stable. She states that she has some sharp pain but they resolve. She also has RA in multiple joints but the pain is worse in her hands today. She admits that the numbness in her hands is positional.  The patient  reports that she uses drugs. Her body mass index is 38.38 kg/m.  Further details on both, my assessment(s), as well as the proposed treatment plan, please see below.  Controlled Substance Pharmacotherapy Assessment REMS (Risk Evaluation and Mitigation Strategy)  Analgesic:Hydrocodone/APAP 10/325 one every 8 hours PRN (30 mg/day) MME/day:30 mg/day [name], RN  05/20/2017 10:05 AM  Sign at close encounter Nursing Pain Medication Assessment:  Safety precautions to be maintained throughout the outpatient stay will include: orient to surroundings, keep bed in low position, maintain call bell within reach at all times, provide assistance with transfer out of bed and ambulation.  Medication Inspection Compliance: Pill count conducted under aseptic conditions, in front of the patient. Neither the pills nor the bottle was removed from the patient's sight at any time. Once count was completed pills were immediately returned to the patient in their original bottle.  Medication: Hydrocodone/APAP Pill/Patch Count: 65 of 90 pills remain Pill/Patch Appearance: Markings consistent with prescribed medication Bottle Appearance: Standard pharmacy container. Clearly labeled. Filled  Date: 06 / 21 / 2018 Last Medication intake:  Today    Pharmacokinetics: Liberation and absorption (onset of action): WNL Distribution (time to peak effect): WNL Metabolism and excretion (duration of action): WNL         Pharmacodynamics: Desired effects: Analgesia: Ms. Haack reports >50% benefit. Functional ability: Patient reports that medication allows her to accomplish basic ADLs Clinically meaningful improvement in function (CMIF): Sustained CMIF goals met Perceived effectiveness: Described as relatively effective, allowing for increase in activities of daily living (ADL) Undesirable effects: Side-effects or Adverse reactions: None reported Monitoring: [place] PMP: Online review of the past 30-monthperiod conducted. Compliant with practice rules and regulations List of all UDS test(s) done:  Lab Results  Component Value Date   TOXASSSELUR FINAL 12/04/2016   TOXASSSELUR FINAL 03/15/2016   TOXASSSELUR FINAL 12/13/2015   Last UDS on record: Tox[employer] Select 13  Date Value Ref Range Status  12/04/2016 FINAL  Final    Comment:    ==================================================================== TOXASSURE SELECT 13 (MW) ==================================================================== Test                             Result       Flag       Units Drug Present and Declared for Prescription Verification   Hydrocodone                    1947         EXPECTED   ng/mg creat   Hydromorphone                  100          EXPECTED   ng/mg creat   Dihydrocodeine                 237          EXPECTED   ng/mg creat   Norhydrocodone                 1204         EXPECTED   ng/mg creat    Sources of hydrocodone include scheduled prescription    medications. Hydromorphone, dihydrocodeine and norhydrocodone are    expected metabolites of hydrocodone. Hydromorphone and    dihydrocodeine are also available as scheduled prescription    medications. ==================================================================== Test                       Result    Flag   Units      Ref Range   Creatinine              79               mg/dL      >=20 ==================================================================== Declared Medications:  The flagging and interpretation on this report are based on the  following declared medications.  Unexpected results may arise from  inaccuracies in the declared medications.  **Note: The testing scope of this panel includes these medications:  Hydrocodone (Norco)  **Note: The testing scope of this panel does not include following  reported medications:  Acetaminophen (Norco)  Atenolol  Duloxetine (Cymbalta)  Furosemide (Lasix)  Losartan (Cozaar)  Magnesium (Mag-Ox)  Metformin  Naproxen (Naprosyn)  Ondansetron  Pantoprazole (Protonix)  Potassium (Klor-Con)  Pravastatin (Pravachol)  Turmeric (Tumeric Root)  Vitamin D3 ==================================================================== For clinical consultation, please call ([phone] ====================================================================    UDS interpretation: Compliant  Medication Assessment Form: Reviewed. Patient indicates being compliant with therapy Treatment compliance: Compliant Risk Assessment Profile: Aberrant behavior: See prior evaluations. None observed or detected today Comorbid factors increasing risk of overdose: See prior notes. No additional risks detected today Risk of substance use disorder (SUD): Low Opioid Risk Tool (ORT) Total Score: 3  Interpretation Table:  Score <3 = Low Risk for SUD  Score between 4-7 = Moderate Risk for SUD  Score >8 = High Risk for Opioid Abuse   Risk Mitigation Strategies:  Patient Counseling: Covered Patient-Prescriber Agreement (PPA): Present and active  Notification to other healthcare providers: Done  Pharmacologic Plan: No change in therapy, at this time  Laboratory Chemistry  Inflammation Markers (CRP: Acute Phase) (ESR: Chronic Phase) Lab Results   Component Value Date   ESRSEDRATE 37 (H) 01/06/2013                 Renal Function Markers Lab Results  Component Value Date   BUN 23 (H) 10/27/2015   CREATININE 0.93 10/27/2015   GFRAA >60 10/27/2015   GFRNONAA >60 10/27/2015                 Hepatic Function Markers Lab Results  Component Value Date   AST 20 10/27/2015   ALT 16 10/27/2015   ALBUMIN 4.0 10/27/2015   ALKPHOS 83 10/27/2015                 Electrolytes Lab Results  Component Value Date   NA 138 10/27/2015   K 3.3 (L) 10/27/2015   CL 95 (L) 10/27/2015   CALCIUM 9.5 10/27/2015   MG 1.4 (L) 12/16/2012                 Neuropathy Markers No results found for: [id number]               Bone Pathology Markers Lab Results  Component Value Date   ALKPHOS 83 10/27/2015   CALCIUM 9.5 10/27/2015                 Coagulation Parameters Lab Results  Component Value Date   INR 0.94 11/23/2009   LABPROT 12.5 11/23/2009   APTT 20 (L) 11/23/2009   PLT 346 10/27/2015                 Cardiovascular Markers Lab Results  Component Value Date   BNP 265 (H) 12/16/2012   HGB 13.7 10/27/2015   HCT 43.1 10/27/2015                 Note: Lab results reviewed.  Recent Diagnostic Imaging Review  Dg Chest 2 View  Result Date: 10/27/2015 CLINICAL DATA:  Chest pain EXAM: CHEST  2 VIEW COMPARISON:  12/16/2012 FINDINGS: There is no edema, consolidation, effusion, or pneumothorax. Normal heart size and stable aortic tortuosity. Moderate hiatal hernia with gastric surgical clips. IMPRESSION: 1. No acute finding. 2. Moderate hiatal hernia. Electronically Signed   By: Monte Fantasia M.D.   On: 10/27/2015 15:21   Note: Imaging results reviewed.          Meds   Current Meds  Medication Sig  . ACCU-CHEK AVIVA PLUS test strip DX E11.9, DAILY TESTING  . ACCU-CHEK SOFTCLIX LANCETS lancets USE AS NEEDED FOR BLOOD GLUOCOSE MONITORING CHECKING DAILY  . albuterol (PROVENTIL HFA;VENTOLIN HFA) 108 (90 BASE) MCG/ACT inhaler Inhale  2 puffs into the lungs every 4 (four) hours as needed for wheezing or shortness of breath.  Marland Kitchen atenolol (TENORMIN) 25  MG tablet Take 25 mg by mouth daily.   . Blood Glucose Monitoring Suppl (ACCU-CHEK AVIVA PLUS) w/Device KIT DX E11.9 CHECK GLUCOSE DAILY  . Cholecalciferol (VITAMIN D3) 2000 UNITS TABS Take 1 tablet by mouth daily.  . DULoxetine (CYMBALTA) 30 MG capsule Take 30 mg by mouth 2 (two) times daily.  . furosemide (LASIX) 20 MG tablet Take 20 mg by mouth daily.  Derrill Memo ON 06/08/2017] HYDROcodone-acetaminophen (NORCO) 10-325 MG tablet Take 1 tablet by mouth every 8 (eight) hours as needed for severe pain.  Marland Kitchen losartan (COZAAR) 25 MG tablet Take 25 mg by mouth daily.  . magnesium oxide (MAG-OX) 400 (241.3 Mg) MG tablet TAKE 1 BY MOUTH ONCE A DAY, DO NOT TAKE WHILE TAKING ANY ANTIBIOTICS DR. Elizbeth Squires [phone]  . Multiple Vitamin (MULTI-VITAMINS) TABS Take by mouth.  . naproxen (NAPROSYN) 250 MG tablet Take 250 mg by mouth as needed.   . ondansetron (ZOFRAN-ODT) 4 MG disintegrating tablet TAKE 1 TABLET (4 MG TOTAL) BY MOUTH EVERY SIX (6) HOURS AS NEEDED. FOR UP TO 7 DAYS  . pantoprazole (PROTONIX) 40 MG tablet Take 40 mg by mouth daily.  . potassium chloride SA (K-DUR,KLOR-CON) 20 MEQ tablet Take 20 mEq by mouth daily.   . pravastatin (PRAVACHOL) 20 MG tablet Take 20 mg by mouth at bedtime. Reported on 12/13/2015  . Turmeric Curcumin 500 MG CAPS Take by mouth daily.   . vitamin C (ASCORBIC ACID) 500 MG tablet Take 500 mg by mouth daily.  . [DISCONTINUED] HYDROcodone-acetaminophen (NORCO) 10-325 MG tablet Take 1 tablet by mouth every 8 (eight) hours as needed for severe pain.  . [DISCONTINUED] HYDROcodone-acetaminophen (NORCO) 10-325 MG tablet Take 1 tablet by mouth every 8 (eight) hours as needed for severe pain.    ROS  Constitutional: Denies any fever or chills Gastrointestinal: No reported hemesis, hematochezia, vomiting, or acute GI distress Musculoskeletal: Denies any acute onset  joint swelling, redness, loss of ROM, or weakness Neurological: No reported episodes of acute onset apraxia, aphasia, dysarthria, agnosia, amnesia, paralysis, loss of coordination, or loss of consciousness  Allergies  Ms. Jankowski is allergic to contrast media [iodinated diagnostic agents].  PFSH  Drug: Ms. Curington  reports that she uses drugs. Alcohol:  reports that she does not drink alcohol. Tobacco:  reports that she has never smoked. She has never used smokeless tobacco. Medical:  has a past medical history of Anemia; Arthritis; COPD (chronic obstructive pulmonary disease) ([place]); Depression; Diabetes mellitus without complication ([place]); GERD (gastroesophageal reflux disease); Hiatal hernia; History of contrast media allergy. (IVP dye) (09/01/2015); History of hiatal hernia; Hypercholesteremia; Hypertension; Morbid obesity ([place]); Scoliosis; and Shortness of breath dyspnea. Surgical: Ms. Barfuss  has a past surgical history that includes Abdominal hysterectomy; Cataract extraction w/ intraocular lens implant (Right); Gastric bypass; Hernia repair; Cataract extraction w/PHACO (Left, 07/19/2015); Cholecystectomy; and Hernia repair. Family: family history includes Diabetes in her mother; Heart disease in her father.  Constitutional Exam  General appearance: Well nourished, well developed, and well hydrated. In no apparent acute distress Vitals:   05/20/17 0957  BP: 132/70  Pulse: 72  Resp: 18  Temp: 98.7 F (37.1 C)  TempSrc: Oral  SpO2: 100%  Weight: 190 lb (86.2 kg)  Height: _0  (1.499 m)   BMI Assessment: Estimated body mass index is 38.38 kg/m as calculated from the following:   Height as of this encounter: _1  (1.499 m).   Weight as of this encounter: 190 lb (86.2 kg).  BMI interpretation table: BMI  level Category Range association with higher incidence of chronic pain  <18 kg/m2 Underweight   18.5-24.9 kg/m2 Ideal body weight   25-29.9 kg/m2 Overweight Increased  incidence by 20%  30-34.9 kg/m2 Obese (Class I) Increased incidence by 68%  35-39.9 kg/m2 Severe obesity (Class II) Increased incidence by 136%  >40 kg/m2 Extreme obesity (Class III) Increased incidence by 254%   BMI Readings from Last 4 Encounters:  05/20/17 38.38 kg/m  03/07/17 37.89 kg/m  12/04/16 39.84 kg/m  09/03/16 40.04 kg/m   Wt Readings from Last 4 Encounters:  05/20/17 190 lb (86.2 kg)  03/07/17 194 lb (88 kg)  12/04/16 204 lb (92.5 kg)  09/03/16 205 lb (93 kg)  Psych/Mental status: Alert, oriented x 3 (person, place, & time)       Eyes: PERLA Respiratory: No evidence of acute respiratory distress  Cervical Spine Exam  Inspection: No masses, redness, or swelling Alignment: Symmetrical Functional ROM: Unrestricted ROM      Stability: No instability detected Muscle strength & Tone: Functionally intact Sensory: Unimpaired Palpation: No palpable anomalies              Upper Extremity (UE) Exam    Side: Right upper extremity  Side: Left upper extremity  Inspection: No masses, redness, swelling, or asymmetry. No contractures  Inspection: No masses, redness, swelling, or asymmetry. No contractures  Functional ROM: Unrestricted ROM          Functional ROM: Unrestricted ROM          Muscle strength & Tone: Functionally intact  Muscle strength & Tone: Functionally intact  Sensory: Unimpaired  Sensory: Unimpaired  Palpation: No palpable anomalies              Palpation: No palpable anomalies              Specialized Test(s): Deferred         Specialized Test(s): Deferred          Thoracic Spine Exam  Inspection: No masses, redness, or swelling Alignment: Symmetrical Functional ROM: Unrestricted ROM Stability: No instability detected Sensory: Unimpaired Muscle strength & Tone: No palpable anomalies  Lumbar Spine Exam  Inspection: No masses, redness, or swelling Alignment: Symmetrical Functional ROM: Unrestricted ROM      Stability: No instability detected Muscle  strength & Tone: Functionally intact Sensory: Unimpaired Palpation: Non-tender       Provocative Tests: Lumbar Hyperextension and rotation test: evaluation deferred today       Patrick's Maneuver: evaluation deferred today                    Gait & Posture Assessment  Ambulation: Unassisted Gait: Relatively normal for age and body habitus Posture: WNL   Lower Extremity Exam    Side: Right lower extremity  Side: Left lower extremity  Inspection: Genu valgus   Inspection: Genu valgus   Functional ROM: Unrestricted ROM          Functional ROM: Unrestricted ROM          Muscle strength & Tone: Functionally intact  Muscle strength & Tone: Functionally intact  Sensory: Unimpaired  Sensory: Unimpaired  Palpation: No palpable anomalies  Palpation: No palpable anomalies   Assessment  Primary Diagnosis & Pertinent Problem List: The primary encounter diagnosis was Rheumatoid arthritis involving multiple sites with positive rheumatoid factor ([place]). Diagnoses of Lumbar spondylosis, Chronic low back pain (Location of Primary Source of Pain) (Bilateral) (R>L), Chronic pain syndrome, Long term current use of opiate analgesic, and  Type 2 diabetes mellitus with other specified complication, unspecified whether long term insulin use ([place]) were also pertinent to this visit.  Status Diagnosis  Controlled Controlled Controlled 1. Rheumatoid arthritis involving multiple sites with positive rheumatoid factor ([place])   2. Lumbar spondylosis   3. Chronic low back pain (Location of Primary Source of Pain) (Bilateral) (R>L)   4. Chronic pain syndrome   5. Long term current use of opiate analgesic   6. Type 2 diabetes mellitus with other specified complication, unspecified whether long term insulin use ([facility])     Problems updated and reviewed during this visit: Problem  Lumbar Facet Joint Syndrome ([facility])  Chronic low back pain (Location of Primary Source of Pain) (Bilateral) (R>L)  Lumbar spondylosis  Chronic  hip pain (Right)  Sacral Pain  Rheumatoid Arthritis ([facility])  History of contrast media allergy. (IVP dye)  Arthralgia of hip (Right)  Ventral Incisional Hernia With Obstruction  Other partial intestinal obstruction (H[facility])  Long Term Prescription Opiate Use  Other Specified Health Status  Bariatric Surgery Status  History of Cholecystectomy  Patient Is Jehovah's Witness  S/P Gastric Bypass  Diabetes Mellitus (H[facility])  Hypertension  Encounter for Therapeutic Drug Level Monitoring  Long Term Current Use of Opiate Analgesic  Opiate use (30 MME/Day)   Hydrocodone/APAP 10/325 one every 8 hours PRN (30 mg/day)   Chronic Pain Syndrome  Opiate Analgesic Use Agreement Exists  Morbid Obesity ([facility])  History of carpal tunnel syndrome, right side  Depression  Opioid-induced constipation  Allergic to Radiographic Contrast Media  Constipation Due to Opioid Therapy  Administrative Encounter  H/O Disease  Hieralgia  Prolapse of Vaginal Vault After Hysterectomy  Diabetes ([facility])  Opioid Dependence ([facility])   Plan of Care  Pharmacotherapy (Medications Ordered): Meds ordered this encounter  Medications  . HYDROcodone-acetaminophen (NORCO) 10-325 MG tablet    Sig: Take 1 tablet by mouth every 8 (eight) hours as needed for severe pain.    Dispense:  90 tablet    Refill:  0    Do not place this medication, or any other prescription from our practice, on "Automatic Refill". Patient may have prescription filled one day early if pharmacy is closed on scheduled refill date. Do not fill until: 06/08/17 To last until: 07/08/17    Order Specific Question:   Supervising Provider    Answer:   Milinda Pointer [phone]  . HYDROcodone-acetaminophen (NORCO) 10-325 MG tablet    Sig: Take 1 tablet by mouth every 8 (eight) hours as needed for severe pain.    Dispense:  90 tablet    Refill:  0    Do not place this medication, or any other prescription from our practice, on "Automatic Refill". Patient may have  prescription filled one day early if pharmacy is closed on scheduled refill date. Do not fill until: 07/08/17 To last until: 08/07/17    Order Specific Question:   Supervising Provider    Answer:   Milinda Pointer [phone]  . HYDROcodone-acetaminophen (NORCO) 10-325 MG tablet    Sig: Take 1 tablet by mouth every 8 (eight) hours as needed for severe pain.    Dispense:  90 tablet    Refill:  0    Do not place this medication, or any other prescription from our practice, on "Automatic Refill". Patient may have prescription filled one day early if pharmacy is closed on scheduled refill date. Do not fill until: 08/07/17 To last until: 09/05/17    Order Specific Question:   Supervising Provider  AnswerMilinda Pointer [[id number]]   New Prescriptions   No medications on file   Medications administered today: Ms. Hapke had no medications administered during this visit. Lab-work, procedure(s), and/or referral(s): Orders Placed This Encounter  Procedures  . Comprehensive metabolic panel  . C-reactive protein  . Sedimentation rate  . Magnesium  . 25-Hydroxyvitamin D Lcms D2+D3  . Vitamin B12  . Hemoglobin A1c   Imaging and/or referral(s): None  Interventional therapies: Planned, scheduled, and/or pending:   Not at this time.   Considering:   Palliative lumbar epidural steroid injection under fluoroscopic guidance and IV sedation Palliative bilateral lumbar facet block under fluoroscopic guidance and IV sedation.  Palliative intra-articular hip joint injection under fluoroscopic guidance and IV sedation.    Palliative PRN treatment(s):   Palliative bilateral lumbar facet block under fluoroscopic guidance and IV sedation.  Palliative intra-articular hip joint injection under fluoroscopic guidance and IV sedation.    Provider-requested follow-up: Return in 3 months (on 08/27/2017) for MedMgmt.  Future Appointments Date Time Provider [place]  08/19/2017 10:30  AM Vevelyn Francois, NP [facility] None   Primary Care Physician: Marguerita Merles, MD Location: [facility] Outpatient Pain Management Facility Note by: Vevelyn Francois NP Date: 05/20/2017; Time: 1:28 PM  Pain Score Disclaimer: We use the NRS-11 scale. This is a self-reported, subjective measurement of pain severity with only modest accuracy. It is used primarily to identify changes within a particular patient. It must be understood that outpatient pain scales are significantly less accurate that those used for research, where they can be applied under ideal controlled circumstances with minimal exposure to variables. In reality, the score is likely to be a combination of pain intensity and pain affect, where pain affect describes the degree of emotional arousal or changes in action readiness caused by the sensory experience of pain. Factors such as social and work situation, setting, emotional state, anxiety levels, expectation, and prior pain experience may influence pain perception and show large inter-individual differences that may also be affected by time variables.  Patient instructions provided during this appointment: Patient Instructions   ____________________________________________________________________________________________  Medication Rules  Applies to: All patients receiving prescriptions (written or electronic).  Pharmacy of record: Pharmacy where electronic prescriptions will be sent. If written prescriptions are taken to a different pharmacy, please inform the nursing staff. The pharmacy listed in the electronic medical record should be the one where you would like electronic prescriptions to be sent.  Prescription refills: Only during scheduled appointments. Applies to both, written and electronic prescriptions.  NOTE: The following applies primarily to controlled substances (Opioid* Pain Medications).   Patient's responsibilities: 1. Pain Pills: Bring all pain pills to every  appointment (except for procedure appointments). 2. Pill Bottles: Bring pills in original pharmacy bottle. Always bring newest bottle. Bring bottle, even if empty. 3. Medication refills: You are responsible for knowing and keeping track of what medications you need refilled. The day before your appointment, write a list of all prescriptions that need to be refilled. Bring that list to your appointment and give it to the admitting nurse. Prescriptions will be written only during appointments. If you forget a medication, it will not be "Called in", "Faxed", or "electronically sent". You will need to get another appointment to get these prescribed. 4. Prescription Accuracy: You are responsible for carefully inspecting your prescriptions before leaving our office. Have the discharge nurse carefully go over each prescription with you, before taking them home. Make sure that your name  is accurately spelled, that your address is correct. Check the name and dose of your medication to make sure it is accurate. Check the number of pills, and the written instructions to make sure they are clear and accurate. Make sure that you are given enough medication to last until your next medication refill appointment. 5. Taking Medication: Take medication as prescribed. Never take more pills than instructed. Never take medication more frequently than prescribed. Taking less pills or less frequently is permitted and encouraged, when it comes to controlled substances (written prescriptions).  6. Inform other Doctors: Always inform, all of your healthcare providers, of all the medications you take. 7. Pain Medication from other Providers: You are not allowed to accept any additional pain medication from any other Doctor or Healthcare provider. There are two exceptions to this rule. (see below) In the event that you require additional pain medication, you are responsible for notifying us, as stated below. 8. Medication Agreement: You  are responsible for carefully reading and following our Medication Agreement. This must be signed before receiving any prescriptions from our practice. Safely store a copy of your signed Agreement. Violations to the Agreement will result in no further prescriptions. (Additional copies of our Medication Agreement are available upon request.) 9. Laws, Rules, & Regulations: All patients are expected to follow all Federal and [employer], [employer], Rules, [employer]. Ignorance of the Laws does not constitute a valid excuse. The use of any illegal substances is prohibited. 10. Adopted CDC guidelines & recommendations: Target dosing levels will be at or below 60 MME/day. Use of benzodiazepines** is not recommended.  Exceptions: There are only two exceptions to the rule of not receiving pain medications from other Healthcare Providers. 1. Exception #1 (Emergencies): In the event of an emergency (i.e.: accident requiring emergency care), you are allowed to receive additional pain medication. However, you are responsible for: As soon as you are able, call our office [phone], at any time of the day or night, and leave a message stating your name, the date and nature of the emergency, and the name and dose of the medication prescribed. In the event that your call is answered by a member of our staff, make sure to document and save the date, time, and the name of the person that took your information.  2. Exception #2 (Planned Surgery): In the event that you are scheduled by another doctor or dentist to have any type of surgery or procedure, you are allowed (for a period no longer than 30 days), to receive additional pain medication, for the acute post-op pain. However, in this case, you are responsible for picking up a copy of our "Post-op Pain Management for Surgeons" handout, and giving it to your surgeon or dentist. This document is available at our office, and does not require an appointment to obtain it.  Simply go to our office during business hours (Monday-Thursday from 8:00 AM to 4:00 PM) (Friday 8:00 AM to 12:00 Noon) or if you have a scheduled appointment with [place], prior to your surgery, and ask for it by name. In addition, you will need to provide [place] with your name, name of your surgeon, type of surgery, and date of procedure or surgery.  *Opioid medications include: morphine, codeine, oxycodone, oxymorphone, hydrocodone, hydromorphone, meperidine, tramadol, tapentadol, buprenorphine, fentanyl, methadone. **Benzodiazepine medications include: diazepam (Valium), alprazolam (Xanax), clonazepam (Klonopine), lorazepam (Ativan), clorazepate (Tranxene), chlordiazepoxide (Librium), estazolam (Prosom), oxazepam (Serax), temazepam (Restoril), triazolam (Halcion)  ____________________________________________________________________________________________

## 2017-05-20 NOTE — Patient Instructions (Signed)

## 2017-05-20 NOTE — Progress Notes (Signed)
Nursing Pain Medication Assessment:  Safety precautions to be maintained throughout the outpatient stay will include: orient to surroundings, keep bed in low position, maintain call bell within reach at all times, provide assistance with transfer out of bed and ambulation.  Medication Inspection Compliance: Pill count conducted under aseptic conditions, in front of the patient. Neither the pills nor the bottle was removed from the patient's sight at any time. Once count was completed pills were immediately returned to the patient in their original bottle.  Medication: Hydrocodone/APAP Pill/Patch Count: 65 of 90 pills remain Pill/Patch Appearance: Markings consistent with prescribed medication Bottle Appearance: Standard pharmacy container. Clearly labeled. Filled Date: 06 / 21 / 2018 Last Medication intake:  Today

## 2017-05-23 ENCOUNTER — Ambulatory Visit: Payer: Medicare Other | Admitting: Nurse Practitioner

## 2017-05-25 LAB — COMPREHENSIVE METABOLIC PANEL
A/G RATIO: 1.4 (ref 1.2–2.2)
ALK PHOS: 91 IU/L (ref 39–117)
ALT: 9 IU/L (ref 0–32)
AST: 18 IU/L (ref 0–40)
Albumin: 4.5 g/dL (ref 3.5–4.8)
BILIRUBIN TOTAL: 0.7 mg/dL (ref 0.0–1.2)
BUN/Creatinine Ratio: 29 — ABNORMAL HIGH (ref 12–28)
BUN: 21 mg/dL (ref 8–27)
CHLORIDE: 101 mmol/L (ref 96–106)
CO2: 23 mmol/L (ref 20–29)
Calcium: 9.8 mg/dL (ref 8.7–10.3)
Creatinine, Ser: 0.73 mg/dL (ref 0.57–1.00)
GFR calc Af Amer: 95 mL/min/{1.73_m2} (ref >59)
GFR calc non Af Amer: 83 mL/min/{1.73_m2} (ref >59)
Globulin, Total: 3.2 g/dL (ref 1.5–4.5)
Glucose: 81 mg/dL (ref 65–99)
POTASSIUM: 4.2 mmol/L (ref 3.5–5.2)
Sodium: 144 mmol/L (ref 134–144)
Total Protein: 7.7 g/dL (ref 6.0–8.5)

## 2017-05-25 LAB — HGB A1C W/O EAG: Hgb A1c MFr Bld: 6.1 % — ABNORMAL HIGH (ref 4.8–5.6)

## 2017-05-25 LAB — VITAMIN B12: VITAMIN B 12: 1993 pg/mL — AB (ref 232–1245)

## 2017-05-25 LAB — MAGNESIUM: Magnesium: 2 mg/dL (ref 1.6–2.3)

## 2017-05-25 LAB — SEDIMENTATION RATE: Sed Rate: 40 mm/hr (ref 0–40)

## 2017-05-25 LAB — 25-HYDROXY VITAMIN D LCMS D2+D3
25-Hydroxy, Vitamin D-2: <1 ng/mL
25-Hydroxy, Vitamin D-3: 59 ng/mL

## 2017-05-25 LAB — COMPLIANCE DRUG ANALYSIS, UR

## 2017-05-25 LAB — C-REACTIVE PROTEIN: CRP: 5.5 mg/L — ABNORMAL HIGH (ref 0.0–4.9)

## 2017-05-25 LAB — 25-HYDROXYVITAMIN D LCMS D2+D3: 25-HYDROXY, VITAMIN D: 59 ng/mL

## 2017-07-22 ENCOUNTER — Encounter: Payer: Self-pay | Admitting: Emergency Medicine

## 2017-07-22 ENCOUNTER — Emergency Department
Admission: EM | Admit: 2017-07-22 | Discharge: 2017-07-22 | Disposition: A | Discharge status: Home / Self Care | Payer: Medicare Other | Attending: Emergency Medicine | Admitting: Emergency Medicine

## 2017-07-22 ENCOUNTER — Emergency Department: Payer: Medicare Other

## 2017-07-22 DIAGNOSIS — E119 Type 2 diabetes mellitus without complications: Secondary | ICD-10-CM | POA: Insufficient documentation

## 2017-07-22 DIAGNOSIS — I10 Essential (primary) hypertension: Secondary | ICD-10-CM | POA: Insufficient documentation

## 2017-07-22 DIAGNOSIS — S161XXA Strain of muscle, fascia and tendon at neck level, initial encounter: Secondary | ICD-10-CM | POA: Diagnosis not present

## 2017-07-22 DIAGNOSIS — Y92410 Unspecified street and highway as the place of occurrence of the external cause: Secondary | ICD-10-CM | POA: Insufficient documentation

## 2017-07-22 DIAGNOSIS — Z79899 Other long term (current) drug therapy: Secondary | ICD-10-CM | POA: Diagnosis not present

## 2017-07-22 DIAGNOSIS — Y939 Activity, unspecified: Secondary | ICD-10-CM | POA: Diagnosis not present

## 2017-07-22 DIAGNOSIS — S199XXA Unspecified injury of neck, initial encounter: Secondary | ICD-10-CM | POA: Diagnosis present

## 2017-07-22 DIAGNOSIS — J449 Chronic obstructive pulmonary disease, unspecified: Secondary | ICD-10-CM | POA: Insufficient documentation

## 2017-07-22 DIAGNOSIS — M159 Polyosteoarthritis, unspecified: Secondary | ICD-10-CM | POA: Diagnosis not present

## 2017-07-22 DIAGNOSIS — Y999 Unspecified external cause status: Secondary | ICD-10-CM | POA: Diagnosis not present

## 2017-07-22 DIAGNOSIS — G894 Chronic pain syndrome: Secondary | ICD-10-CM | POA: Insufficient documentation
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
## 2017-08-19 ENCOUNTER — Ambulatory Visit: Payer: Medicare Other | Attending: Nurse Practitioner | Admitting: Nurse Practitioner

## 2017-08-19 ENCOUNTER — Encounter: Payer: Self-pay | Admitting: Nurse Practitioner

## 2017-08-19 VITALS — BP 163/91 | HR 76 | Temp 98.0°F | Resp 18 | Ht 60.0 in | Wt 180.0 lb

## 2017-08-19 DIAGNOSIS — G894 Chronic pain syndrome: Secondary | ICD-10-CM | POA: Insufficient documentation

## 2017-08-19 DIAGNOSIS — G8929 Other chronic pain: Secondary | ICD-10-CM

## 2017-08-19 DIAGNOSIS — M545 Low back pain, unspecified: Secondary | ICD-10-CM

## 2017-08-19 DIAGNOSIS — M419 Scoliosis, unspecified: Secondary | ICD-10-CM | POA: Diagnosis not present

## 2017-08-19 DIAGNOSIS — K219 Gastro-esophageal reflux disease without esophagitis: Secondary | ICD-10-CM | POA: Insufficient documentation

## 2017-08-19 DIAGNOSIS — D649 Anemia, unspecified: Secondary | ICD-10-CM | POA: Diagnosis not present

## 2017-08-19 DIAGNOSIS — Z8249 Family history of ischemic heart disease and other diseases of the circulatory system: Secondary | ICD-10-CM | POA: Diagnosis not present

## 2017-08-19 DIAGNOSIS — J449 Chronic obstructive pulmonary disease, unspecified: Secondary | ICD-10-CM | POA: Insufficient documentation

## 2017-08-19 DIAGNOSIS — Z7984 Long term (current) use of oral hypoglycemic drugs: Secondary | ICD-10-CM | POA: Diagnosis not present

## 2017-08-19 DIAGNOSIS — M25551 Pain in right hip: Secondary | ICD-10-CM | POA: Diagnosis not present

## 2017-08-19 DIAGNOSIS — F329 Major depressive disorder, single episode, unspecified: Secondary | ICD-10-CM | POA: Diagnosis not present

## 2017-08-19 DIAGNOSIS — Z79899 Other long term (current) drug therapy: Secondary | ICD-10-CM | POA: Insufficient documentation

## 2017-08-19 DIAGNOSIS — Z9841 Cataract extraction status, right eye: Secondary | ICD-10-CM | POA: Insufficient documentation

## 2017-08-19 DIAGNOSIS — I10 Essential (primary) hypertension: Secondary | ICD-10-CM | POA: Insufficient documentation

## 2017-08-19 DIAGNOSIS — Z9889 Other specified postprocedural states: Secondary | ICD-10-CM | POA: Diagnosis not present

## 2017-08-19 DIAGNOSIS — Z833 Family history of diabetes mellitus: Secondary | ICD-10-CM | POA: Diagnosis not present

## 2017-08-19 DIAGNOSIS — E7800 Pure hypercholesterolemia, unspecified: Secondary | ICD-10-CM | POA: Insufficient documentation

## 2017-08-19 DIAGNOSIS — M069 Rheumatoid arthritis, unspecified: Secondary | ICD-10-CM | POA: Diagnosis not present

## 2017-08-19 DIAGNOSIS — Z5181 Encounter for therapeutic drug level monitoring: Secondary | ICD-10-CM | POA: Insufficient documentation

## 2017-08-19 DIAGNOSIS — E119 Type 2 diabetes mellitus without complications: Secondary | ICD-10-CM | POA: Insufficient documentation

## 2017-08-19 DIAGNOSIS — K449 Diaphragmatic hernia without obstruction or gangrene: Secondary | ICD-10-CM | POA: Insufficient documentation

## 2017-08-19 DIAGNOSIS — Z9049 Acquired absence of other specified parts of digestive tract: Secondary | ICD-10-CM | POA: Diagnosis not present

## 2017-08-19 DIAGNOSIS — Z9884 Bariatric surgery status: Secondary | ICD-10-CM | POA: Diagnosis not present

## 2017-08-19 MED ORDER — HYDROCODONE-ACETAMINOPHEN 10-325 MG PO TABS: 1.0000 | ORAL_TABLET | Freq: Three times a day (TID) | ORAL | 0 refills | Status: DC | PRN

## 2017-08-19 NOTE — Progress Notes (Addendum)
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 of your medication to make sure it is accurate. Check the number of pills, and the written instructions to make sure they are clear and accurate. Make sure that you are given enough medication to last until your next medication refill appointment. 5. Taking Medication: Take medication as prescribed. Never take more pills than instructed. Never take medication more frequently than prescribed. Taking less pills or less frequently is permitted and encouraged, when it comes to controlled substances (written prescriptions).  6. Inform other Doctors: Always inform, all of your healthcare providers, of all the medications you take. 7. Pain Medication from other Providers: You are not allowed to accept any additional pain medication from any other Doctor or Healthcare provider. There are two exceptions to this rule. (see below) In the event that you require additional pain medication, you are responsible for notifying us, as stated below. 8. Medication Agreement: You  are responsible for carefully reading and following our Medication Agreement. This must be signed before receiving any prescriptions from our practice. Safely store a copy of your signed Agreement. Violations to the Agreement will result in no further prescriptions. (Additional copies of our Medication Agreement are available upon request.) 9. Laws, Rules, & Regulations: All patients are expected to follow all Federal and [employer], [employer], Rules, [employer]. Ignorance of the Laws does not constitute a valid excuse. The use of any illegal substances is prohibited. 10. Adopted CDC guidelines & recommendations: Target dosing levels will be at or below 60 MME/day. Use of benzodiazepines** is not recommended.  Exceptions: There are only two exceptions to the rule of not receiving pain medications from other Healthcare Providers. 1. Exception #1 (Emergencies): In the event of an emergency (i.e.: accident requiring emergency care), you are allowed to receive additional pain medication. However, you are responsible for: As soon as you are able, call our office [phone], at any time of the day or night, and leave a message stating your name, the date and nature of the emergency, and the name and dose of the medication prescribed. In the event that your call is answered by a member of our staff, make sure to document and save the date, time, and the name of the person that took your information.  2. Exception #2 (Planned Surgery): In the event that you are scheduled by another doctor or dentist to have any type of surgery or procedure, you are allowed (for a period no longer than 30 days), to receive additional pain medication, for the acute post-op pain. However, in this case, you are responsible for picking up a copy of our "Post-op Pain Management for Surgeons" handout, and giving it to your surgeon or dentist. This document is available at our office, and does not require an appointment to obtain it.  Simply go to our office during business hours (Monday-Thursday from 8:00 AM to 4:00 PM) (Friday 8:00 AM to 12:00 Noon) or if you have a scheduled appointment with [place], prior to your surgery, and ask for it by name. In addition, you will need to provide [place] with your name, name of your surgeon, type of surgery, and date of procedure or surgery.  *Opioid medications include:  morphine, codeine, oxycodone, oxymorphone, hydrocodone, hydromorphone, meperidine, tramadol, tapentadol, buprenorphine, fentanyl, methadone. **Benzodiazepine medications include: diazepam (Valium), alprazolam (Xanax), clonazepam (Klonopine), lorazepam (Ativan), clorazepate (Tranxene), chlordiazepoxide (Librium), estazolam (Prosom), oxazepam (Serax), temazepam (Restoril), triazolam (Halcion)  ____________________________________________________________________________________________  BMI Assessment: Estimated body mass index is 35.15 kg/m as calculated from the following:   Height as of this encounter: 5' (1.524 m).   Weight as of this encounter: 180 lb (81.6 kg).  BMI interpretation table: BMI level Category Range association with higher incidence of chronic pain  <18 kg/m2 Underweight   18.5-24.9 kg/m2 Ideal body weight   25-29.9 kg/m2 Overweight Increased incidence by 20%  30-34.9 kg/m2 Obese (Class I) Increased incidence by 68%  35-39.9 kg/m2 Severe obesity (Class II) Increased incidence by 136%  >40 kg/m2 Extreme obesity (Class III) Increased incidence by 254%   BMI Readings from Last 4 Encounters:  08/19/17 35.15 kg/m  07/22/17 35.74 kg/m  05/20/17 38.38 kg/m  03/07/17 37.89 kg/m   Wt Readings from Last 4 Encounters:  08/19/17 180 lb (81.6 kg)  07/22/17 183 lb (83 kg)  05/20/17 190 lb (86.2 kg)  03/07/17 194 lb (88 kg)

## 2017-08-19 NOTE — Patient Instructions (Addendum)
____________________________________________________________________________________________  Medication Rules  Applies to: All patients receiving prescriptions (written or electronic).  Pharmacy of record: Pharmacy where electronic prescriptions will be sent. If written prescriptions are taken to a different pharmacy, please inform the nursing staff. The pharmacy listed in the electronic medical record should be the one where you would like electronic prescriptions to be sent.  Prescription refills: Only during scheduled appointments. Applies to both, written and electronic prescriptions.  NOTE: The following applies primarily to controlled substances (Opioid* Pain Medications).   Patient's responsibilities: 1. Pain Pills: Bring all pain pills to every appointment (except for procedure appointments). 2. Pill Bottles: Bring pills in original pharmacy bottle. Always bring newest bottle. Bring bottle, even if empty. 3. Medication refills: You are responsible for knowing and keeping track of what medications you need refilled. The day before your appointment, write a list of all prescriptions that need to be refilled. Bring that list to your appointment and give it to the admitting nurse. Prescriptions will be written only during appointments. If you forget a medication, it will not be "Called in", "Faxed", or "electronically sent". You will need to get another appointment to get these prescribed. 4. Prescription Accuracy: You are responsible for carefully inspecting your prescriptions before leaving our office. Have the discharge nurse carefully go over each prescription with you, before taking them home. Make sure that your name is accurately spelled, that your address is correct. Check the name and dose of your medication to make sure it is accurate. Check the number of pills, and the written instructions to make sure they are clear and accurate. Make sure that you are given enough medication to  last until your next medication refill appointment. 5. Taking Medication: Take medication as prescribed. Never take more pills than instructed. Never take medication more frequently than prescribed. Taking less pills or less frequently is permitted and encouraged, when it comes to controlled substances (written prescriptions).  6. Inform other Doctors: Always inform, all of your healthcare providers, of all the medications you take. 7. Pain Medication from other Providers: You are not allowed to accept any additional pain medication from any other Doctor or Healthcare provider. There are two exceptions to this rule. (see below) In the event that you require additional pain medication, you are responsible for notifying us, as stated below. 8. Medication Agreement: You are responsible for carefully reading and following our Medication Agreement. This must be signed before receiving any prescriptions from our practice. Safely store a copy of your signed Agreement. Violations to the Agreement will result in no further prescriptions. (Additional copies of our Medication Agreement are available upon request.) 9. Laws, Rules, & Regulations: All patients are expected to follow all Federal and State Laws, Statutes, Rules, & Regulations. Ignorance of the Laws does not constitute a valid excuse. The use of any illegal substances is prohibited. 10. Adopted CDC guidelines & recommendations: Target dosing levels will be at or below 60 MME/day. Use of benzodiazepines** is not recommended.  Exceptions: There are only two exceptions to the rule of not receiving pain medications from other Healthcare Providers. 1. Exception #1 (Emergencies): In the event of an emergency (i.e.: accident requiring emergency care), you are allowed to receive additional pain medication. However, you are responsible for: As soon as you are able, call our office [phone], at any time of the day or night, and leave a message stating your  name, the date and nature of the emergency, and the name and dose of the medication   prescribed. In the event that your call is answered by a member of our staff, make sure to document and save the date, time, and the name of the person that took your information.  2. Exception #2 (Planned Surgery): In the event that you are scheduled by another doctor or dentist to have any type of surgery or procedure, you are allowed (for a period no longer than 30 days), to receive additional pain medication, for the acute post-op pain. However, in this case, you are responsible for picking up a copy of our "Post-op Pain Management for Surgeons" handout, and giving it to your surgeon or dentist. This document is available at our office, and does not require an appointment to obtain it. Simply go to our office during business hours (Monday-Thursday from 8:00 AM to 4:00 PM) (Friday 8:00 AM to 12:00 Noon) or if you have a scheduled appointment with [place], prior to your surgery, and ask for it by name. In addition, you will need to provide [place] with your name, name of your surgeon, type of surgery, and date of procedure or surgery.  *Opioid medications include: morphine, codeine, oxycodone, oxymorphone, hydrocodone, hydromorphone, meperidine, tramadol, tapentadol, buprenorphine, fentanyl, methadone. **Benzodiazepine medications include: diazepam (Valium), alprazolam (Xanax), clonazepam (Klonopine), lorazepam (Ativan), clorazepate (Tranxene), chlordiazepoxide (Librium), estazolam (Prosom), oxazepam (Serax), temazepam (Restoril), triazolam (Halcion)  ____________________________________________________________________________________________  BMI Assessment: Estimated body mass index is 35.15 kg/m as calculated from the following:   Height as of this encounter: 5' (1.524 m).   Weight as of this encounter: 180 lb (81.6 kg).  BMI interpretation table: BMI level Category Range association with higher incidence of chronic pain   <18 kg/m2 Underweight   18.5-24.9 kg/m2 Ideal body weight   25-29.9 kg/m2 Overweight Increased incidence by 20%  30-34.9 kg/m2 Obese (Class I) Increased incidence by 68%  35-39.9 kg/m2 Severe obesity (Class II) Increased incidence by 136%  >40 kg/m2 Extreme obesity (Class III) Increased incidence by 254%   BMI Readings from Last 4 Encounters:  08/19/17 35.15 kg/m  07/22/17 35.74 kg/m  05/20/17 38.38 kg/m  03/07/17 37.89 kg/m   Wt Readings from Last 4 Encounters:  08/19/17 180 lb (81.6 kg)  07/22/17 183 lb (83 kg)  05/20/17 190 lb (86.2 kg)  03/07/17 194 lb (88 kg)

## 2017-08-19 NOTE — Progress Notes (Signed)
Nursing Pain Medication Assessment:  Safety precautions to be maintained throughout the outpatient stay will include: orient to surroundings, keep bed in low position, maintain call bell within reach at all times, provide assistance with transfer out of bed and ambulation.  Medication Inspection Compliance: Pill count conducted under aseptic conditions, in front of the patient. Neither the pills nor the bottle was removed from the patient's sight at any time. Once count was completed pills were immediately returned to the patient in their original bottle.  Medication: Hydrocodone/APAP Pill/Patch Count: 50 of 90 pills remain Pill/Patch Appearance: Markings consistent with prescribed medication Bottle Appearance: Standard pharmacy container. Clearly labeled. Filled Date: 09 /09 / 2018 Last Medication intake:  Today

## 2017-11-25 ENCOUNTER — Other Ambulatory Visit: Payer: Self-pay

## 2017-11-25 ENCOUNTER — Encounter: Payer: Self-pay | Admitting: Nurse Practitioner

## 2017-11-25 ENCOUNTER — Ambulatory Visit: Payer: Medicare Other | Attending: Nurse Practitioner | Admitting: Nurse Practitioner

## 2017-11-25 VITALS — BP 148/83 | HR 61 | Temp 98.5°F | Ht 60.0 in | Wt 175.0 lb

## 2017-11-25 DIAGNOSIS — Z9889 Other specified postprocedural states: Secondary | ICD-10-CM | POA: Diagnosis not present

## 2017-11-25 DIAGNOSIS — Z79891 Long term (current) use of opiate analgesic: Secondary | ICD-10-CM | POA: Diagnosis not present

## 2017-11-25 DIAGNOSIS — Z79899 Other long term (current) drug therapy: Secondary | ICD-10-CM | POA: Diagnosis not present

## 2017-11-25 DIAGNOSIS — J449 Chronic obstructive pulmonary disease, unspecified: Secondary | ICD-10-CM | POA: Diagnosis not present

## 2017-11-25 DIAGNOSIS — Z90710 Acquired absence of both cervix and uterus: Secondary | ICD-10-CM | POA: Diagnosis not present

## 2017-11-25 DIAGNOSIS — M47816 Spondylosis without myelopathy or radiculopathy, lumbar region: Secondary | ICD-10-CM | POA: Diagnosis not present

## 2017-11-25 DIAGNOSIS — G894 Chronic pain syndrome: Secondary | ICD-10-CM | POA: Insufficient documentation

## 2017-11-25 DIAGNOSIS — F329 Major depressive disorder, single episode, unspecified: Secondary | ICD-10-CM | POA: Diagnosis not present

## 2017-11-25 DIAGNOSIS — M069 Rheumatoid arthritis, unspecified: Secondary | ICD-10-CM | POA: Insufficient documentation

## 2017-11-25 DIAGNOSIS — E119 Type 2 diabetes mellitus without complications: Secondary | ICD-10-CM | POA: Diagnosis not present

## 2017-11-25 DIAGNOSIS — Z5181 Encounter for therapeutic drug level monitoring: Secondary | ICD-10-CM | POA: Diagnosis present

## 2017-11-25 DIAGNOSIS — G8929 Other chronic pain: Secondary | ICD-10-CM | POA: Diagnosis not present

## 2017-11-25 DIAGNOSIS — M545 Low back pain: Secondary | ICD-10-CM | POA: Insufficient documentation

## 2017-11-25 MED ORDER — HYDROCODONE-ACETAMINOPHEN 10-325 MG PO TABS: 1.0000 | ORAL_TABLET | Freq: Three times a day (TID) | ORAL | 0 refills | Status: DC | PRN

## 2017-11-25 NOTE — Progress Notes (Signed)
Nursing Pain Medication Assessment:  Safety precautions to be maintained throughout the outpatient stay will include: orient to surroundings, keep bed in low position, maintain call bell within reach at all times, provide assistance with transfer out of bed and ambulation.  Medication Inspection Compliance: Pill count conducted under aseptic conditions, in front of the patient. Neither the pills nor the bottle was removed from the patient's sight at any time. Once count was completed pills were immediately returned to the patient in their original bottle.  Medication: Hydrocodone/APAP Pill/Patch Count: 23 of 90 pills remain Pill/Patch Appearance: Markings consistent with prescribed medication Bottle Appearance: Standard pharmacy container. Clearly labeled. Filled Date: 12/17/ 2018 Last Medication intake:  Today

## 2017-11-25 NOTE — Progress Notes (Addendum)
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 responsible for knowing and keeping track of what medications you need refilled. The day before your appointment, write a list of all prescriptions that need to be refilled. Bring that list to your appointment and give it to the admitting nurse. Prescriptions will be written only during appointments. If you forget a medication, it will not be "Called in", "Faxed", or "electronically sent". You will need to get another appointment to get these prescribed. 4. Prescription Accuracy: You are responsible for carefully inspecting your prescriptions before leaving our office. Have the discharge nurse carefully go over each prescription with you, before taking them home. Make sure that your name is accurately spelled, that your address is correct. Check the name and dose of your medication to make sure it is accurate. Check the number of pills, and the written instructions to make sure they are  clear and accurate. Make sure that you are given enough medication to last until your next medication refill appointment. 5. Taking Medication: Take medication as prescribed. Never take more pills than instructed. Never take medication more frequently than prescribed. Taking less pills or less frequently is permitted and encouraged, when it comes to controlled substances (written prescriptions).  6. Inform other Doctors: Always inform, all of your healthcare providers, of all the medications you take. 7. Pain Medication from other Providers: You are not allowed to accept any additional pain medication from any other Doctor or Healthcare provider. There are two exceptions to this rule. (see below) In the event that you require additional pain medication, you are responsible for notifying us, as stated below. 8. Medication Agreement: You are responsible for carefully reading and following our Medication Agreement. This must be signed before receiving any prescriptions from our practice. Safely store a copy of your signed Agreement. Violations to the Agreement will result in no further prescriptions. (Additional copies of our Medication Agreement are available upon request.) 9. Laws, Rules, & Regulations: All patients are expected to follow all Federal and [employer], [employer], Rules, [employer] [facility]. Ignorance of the Laws does not constitute a valid excuse. The use of any illegal substances is prohibited. 10. Adopted CDC guidelines & recommendations: Target dosing levels will be at or below 60 MME/day. Use of benzodiazepines** is not recommended.  Exceptions: There are only two exceptions to the rule of not receiving pain medications from other Healthcare Providers. 1. Exception #1 (Emergencies): In the event of an emergency (i.e.: accident requiring emergency care), you are allowed to receive additional pain medication. However, you are responsible for: As soon as you are able, call our office [phone],  at any time of the day or night, and leave a message stating your name, the date and nature of the  emergency, and the name and dose of the medication prescribed. In the event that your call is answered by a member of our staff, make sure to document and save the date, time, and the name of the person that took your information.  2. Exception #2 (Planned Surgery): In the event that you are scheduled by another doctor or dentist to have any type of surgery or procedure, you are allowed (for a period no longer than 30 days), to receive additional pain medication, for the acute post-op pain. However, in this case, you are responsible for picking up a copy of our "Post-op Pain Management for Surgeons" handout, and giving it to your surgeon or dentist. This document is available at our office, and does not require an appointment to obtain it. Simply go to our office during business hours (Monday-Thursday from 8:00 AM to 4:00 PM) (Friday 8:00 AM to 12:00 Noon) or if you have a scheduled appointment with [place], prior to your surgery, and ask for it by name. In addition, you will need to provide [place] with your name, name of your surgeon, type of surgery, and date of procedure or surgery.  *Opioid medications include: morphine, codeine, oxycodone, oxymorphone, hydrocodone, hydromorphone, meperidine, tramadol, tapentadol, buprenorphine, fentanyl, methadone. **Benzodiazepine medications include: diazepam (Valium), alprazolam (Xanax), clonazepam (Klonopine), lorazepam (Ativan), clorazepate (Tranxene), chlordiazepoxide (Librium), estazolam (Prosom), oxazepam (Serax), temazepam (Restoril), triazolam (Halcion)  ____________________________________________________________________________________________

## 2017-11-25 NOTE — Patient Instructions (Addendum)
____________________________________________________________________________________________  Medication Rules: patient given three prescriptions for hydrocodone/APAP  Applies to: All patients receiving prescriptions (written or electronic).  Pharmacy of record: Pharmacy where electronic prescriptions will be sent. If written prescriptions are taken to a different pharmacy, please inform the nursing staff. The pharmacy listed in the electronic medical record should be the one where you would like electronic prescriptions to be sent.  Prescription refills: Only during scheduled appointments. Applies to both, written and electronic prescriptions.  NOTE: The following applies primarily to controlled substances (Opioid* Pain Medications).   Patient's responsibilities: 1. Pain Pills: Bring all pain pills to every appointment (except for procedure appointments). 2. Pill Bottles: Bring pills in original pharmacy bottle. Always bring newest bottle. Bring bottle, even if empty. 3. Medication refills: You are responsible for knowing and keeping track of what medications you need refilled. The day before your appointment, write a list of all prescriptions that need to be refilled. Bring that list to your appointment and give it to the admitting nurse. Prescriptions will be written only during appointments. If you forget a medication, it will not be "Called in", "Faxed", or "electronically sent". You will need to get another appointment to get these prescribed. 4. Prescription Accuracy: You are responsible for carefully inspecting your prescriptions before leaving our office. Have the discharge nurse carefully go over each prescription with you, before taking them home. Make sure that your name is accurately spelled, that your address is correct. Check the name and dose of your medication to make sure it is accurate. Check the number of pills, and the written instructions to make sure they are clear and  accurate. Make sure that you are given enough medication to last until your next medication refill appointment. 5. Taking Medication: Take medication as prescribed. Never take more pills than instructed. Never take medication more frequently than prescribed. Taking less pills or less frequently is permitted and encouraged, when it comes to controlled substances (written prescriptions).  6. Inform other Doctors: Always inform, all of your healthcare providers, of all the medications you take. 7. Pain Medication from other Providers: You are not allowed to accept any additional pain medication from any other Doctor or Healthcare provider. There are two exceptions to this rule. (see below) In the event that you require additional pain medication, you are responsible for notifying us, as stated below. 8. Medication Agreement: You are responsible for carefully reading and following our Medication Agreement. This must be signed before receiving any prescriptions from our practice. Safely store a copy of your signed Agreement. Violations to the Agreement will result in no further prescriptions. (Additional copies of our Medication Agreement are available upon request.) 9. Laws, Rules, & Regulations: All patients are expected to follow all [address] and [employer], [employer], Rules, [employer]. Ignorance of the Laws does not constitute a valid excuse. The use of any illegal substances is prohibited. 10. Adopted CDC guidelines & recommendations: Target dosing levels will be at or below 60 MME/day. Use of benzodiazepines** is not recommended.  Exceptions: There are only two exceptions to the rule of not receiving pain medications from other Healthcare Providers. 1. Exception #1 (Emergencies): In the event of an emergency (i.e.: accident requiring emergency care), you are allowed to receive additional pain medication. However, you are responsible for: As soon as you are able, call our office [phone], at any  time of the day or night, and leave a message stating your name, the date and nature of the emergency, and the  name and dose of the medication prescribed. In the event that your call is answered by a member of our staff, make sure to document and save the date, time, and the name of the person that took your information.  2. Exception #2 (Planned Surgery): In the event that you are scheduled by another doctor or dentist to have any type of surgery or procedure, you are allowed (for a period no longer than 30 days), to receive additional pain medication, for the acute post-op pain. However, in this case, you are responsible for picking up a copy of our "Post-op Pain Management for Surgeons" handout, and giving it to your surgeon or dentist. This document is available at our office, and does not require an appointment to obtain it. Simply go to our office during business hours (Monday-Thursday from 8:00 AM to 4:00 PM) (Friday 8:00 AM to 12:00 Noon) or if you have a scheduled appointment with [place], prior to your surgery, and ask for it by name. In addition, you will need to provide [place] with your name, name of your surgeon, type of surgery, and date of procedure or surgery.  *Opioid medications include: morphine, codeine, oxycodone, oxymorphone, hydrocodone, hydromorphone, meperidine, tramadol, tapentadol, buprenorphine, fentanyl, methadone. **Benzodiazepine medications include: diazepam (Valium), alprazolam (Xanax), clonazepam (Klonopine), lorazepam (Ativan), clorazepate (Tranxene), chlordiazepoxide (Librium), estazolam (Prosom), oxazepam (Serax), temazepam (Restoril), triazolam (Halcion)  ____________________________________________________________________________________________

## 2018-02-17 ENCOUNTER — Ambulatory Visit: Payer: Medicare Other | Attending: Nurse Practitioner | Admitting: Nurse Practitioner

## 2018-02-17 ENCOUNTER — Other Ambulatory Visit: Payer: Self-pay

## 2018-02-17 ENCOUNTER — Encounter: Payer: Self-pay | Admitting: Nurse Practitioner

## 2018-02-17 VITALS — BP 115/56 | HR 63 | Temp 98.5°F | Resp 18 | Ht 60.0 in | Wt 173.0 lb

## 2018-02-17 DIAGNOSIS — Z9884 Bariatric surgery status: Secondary | ICD-10-CM | POA: Insufficient documentation

## 2018-02-17 DIAGNOSIS — K449 Diaphragmatic hernia without obstruction or gangrene: Secondary | ICD-10-CM | POA: Diagnosis not present

## 2018-02-17 DIAGNOSIS — I10 Essential (primary) hypertension: Secondary | ICD-10-CM | POA: Insufficient documentation

## 2018-02-17 DIAGNOSIS — E119 Type 2 diabetes mellitus without complications: Secondary | ICD-10-CM | POA: Diagnosis not present

## 2018-02-17 DIAGNOSIS — M47816 Spondylosis without myelopathy or radiculopathy, lumbar region: Secondary | ICD-10-CM | POA: Diagnosis not present

## 2018-02-17 DIAGNOSIS — J449 Chronic obstructive pulmonary disease, unspecified: Secondary | ICD-10-CM | POA: Insufficient documentation

## 2018-02-17 DIAGNOSIS — Z9049 Acquired absence of other specified parts of digestive tract: Secondary | ICD-10-CM | POA: Diagnosis not present

## 2018-02-17 DIAGNOSIS — K219 Gastro-esophageal reflux disease without esophagitis: Secondary | ICD-10-CM | POA: Diagnosis not present

## 2018-02-17 DIAGNOSIS — M069 Rheumatoid arthritis, unspecified: Secondary | ICD-10-CM | POA: Diagnosis not present

## 2018-02-17 DIAGNOSIS — Z79899 Other long term (current) drug therapy: Secondary | ICD-10-CM | POA: Insufficient documentation

## 2018-02-17 DIAGNOSIS — Z9889 Other specified postprocedural states: Secondary | ICD-10-CM | POA: Diagnosis not present

## 2018-02-17 DIAGNOSIS — K5669 Other partial intestinal obstruction: Secondary | ICD-10-CM | POA: Insufficient documentation

## 2018-02-17 DIAGNOSIS — Z8249 Family history of ischemic heart disease and other diseases of the circulatory system: Secondary | ICD-10-CM | POA: Insufficient documentation

## 2018-02-17 DIAGNOSIS — G894 Chronic pain syndrome: Secondary | ICD-10-CM | POA: Insufficient documentation

## 2018-02-17 DIAGNOSIS — G5601 Carpal tunnel syndrome, right upper limb: Secondary | ICD-10-CM | POA: Insufficient documentation

## 2018-02-17 DIAGNOSIS — M25551 Pain in right hip: Secondary | ICD-10-CM

## 2018-02-17 DIAGNOSIS — M533 Sacrococcygeal disorders, not elsewhere classified: Secondary | ICD-10-CM | POA: Insufficient documentation

## 2018-02-17 DIAGNOSIS — D649 Anemia, unspecified: Secondary | ICD-10-CM | POA: Insufficient documentation

## 2018-02-17 DIAGNOSIS — Z833 Family history of diabetes mellitus: Secondary | ICD-10-CM | POA: Insufficient documentation

## 2018-02-17 DIAGNOSIS — M549 Dorsalgia, unspecified: Secondary | ICD-10-CM | POA: Diagnosis not present

## 2018-02-17 DIAGNOSIS — Z79891 Long term (current) use of opiate analgesic: Secondary | ICD-10-CM | POA: Diagnosis not present

## 2018-02-17 DIAGNOSIS — G8929 Other chronic pain: Secondary | ICD-10-CM

## 2018-02-17 DIAGNOSIS — K430 Incisional hernia with obstruction, without gangrene: Secondary | ICD-10-CM | POA: Diagnosis not present

## 2018-02-17 DIAGNOSIS — F329 Major depressive disorder, single episode, unspecified: Secondary | ICD-10-CM | POA: Diagnosis not present

## 2018-02-17 DIAGNOSIS — Z90710 Acquired absence of both cervix and uterus: Secondary | ICD-10-CM | POA: Insufficient documentation

## 2018-02-17 DIAGNOSIS — Z791 Long term (current) use of non-steroidal anti-inflammatories (NSAID): Secondary | ICD-10-CM | POA: Diagnosis not present

## 2018-02-17 DIAGNOSIS — N993 Prolapse of vaginal vault after hysterectomy: Secondary | ICD-10-CM | POA: Diagnosis not present

## 2018-02-17 MED ORDER — HYDROCODONE-ACETAMINOPHEN 10-325 MG PO TABS: 1.0000 | ORAL_TABLET | Freq: Three times a day (TID) | ORAL | 0 refills | Status: DC | PRN

## 2018-02-17 NOTE — Patient Instructions (Addendum)
____________________________________________________________________________________________  Medication Rules  Applies to: All patients receiving prescriptions (written or electronic).  Pharmacy of record: Pharmacy where electronic prescriptions will be sent. If written prescriptions are taken to a different pharmacy, please inform the nursing staff. The pharmacy listed in the electronic medical record should be the one where you would like electronic prescriptions to be sent.  Prescription refills: Only during scheduled appointments. Applies to both, written and electronic prescriptions.  NOTE: The following applies primarily to controlled substances (Opioid* Pain Medications).   Patient's responsibilities: 1. Pain Pills: Bring all pain pills to every appointment (except for procedure appointments). 2. Pill Bottles: Bring pills in original pharmacy bottle. Always bring newest bottle. Bring bottle, even if empty. 3. Medication refills: You are responsible for knowing and keeping track of what medications you need refilled. The day before your appointment, write a list of all prescriptions that need to be refilled. Bring that list to your appointment and give it to the admitting nurse. Prescriptions will be written only during appointments. If you forget a medication, it will not be "Called in", "Faxed", or "electronically sent". You will need to get another appointment to get these prescribed. 4. Prescription Accuracy: You are responsible for carefully inspecting your prescriptions before leaving our office. Have the discharge nurse carefully go over each prescription with you, before taking them home. Make sure that your name is accurately spelled, that your address is correct. Check the name and dose of your medication to make sure it is accurate. Check the number of pills, and the written instructions to make sure they are clear and accurate. Make sure that you are given enough medication to last  until your next medication refill appointment. 5. Taking Medication: Take medication as prescribed. Never take more pills than instructed. Never take medication more frequently than prescribed. Taking less pills or less frequently is permitted and encouraged, when it comes to controlled substances (written prescriptions).  6. Inform other Doctors: Always inform, all of your healthcare providers, of all the medications you take. 7. Pain Medication from other Providers: You are not allowed to accept any additional pain medication from any other Doctor or Healthcare provider. There are two exceptions to this rule. (see below) In the event that you require additional pain medication, you are responsible for notifying us, as stated below. 8. Medication Agreement: You are responsible for carefully reading and following our Medication Agreement. This must be signed before receiving any prescriptions from our practice. Safely store a copy of your signed Agreement. Violations to the Agreement will result in no further prescriptions. (Additional copies of our Medication Agreement are available upon request.) 9. Laws, Rules, & Regulations: All patients are expected to follow all Federal and State Laws, Statutes, Rules, & Regulations. Ignorance of the Laws does not constitute a valid excuse. The use of any illegal substances is prohibited. 10. Adopted CDC guidelines & recommendations: Target dosing levels will be at or below 60 MME/day. Use of benzodiazepines** is not recommended.  Exceptions: There are only two exceptions to the rule of not receiving pain medications from other Healthcare Providers. 1. Exception #1 (Emergencies): In the event of an emergency (i.e.: accident requiring emergency care), you are allowed to receive additional pain medication. However, you are responsible for: As soon as you are able, call our office [phone], at any time of the day or night, and leave a message stating your name, the  date and nature of the emergency, and the name and dose of the medication   prescribed. In the event that your call is answered by a member of our staff, make sure to document and save the date, time, and the name of the person that took your information.  2. Exception #2 (Planned Surgery): In the event that you are scheduled by another doctor or dentist to have any type of surgery or procedure, you are allowed (for a period no longer than 30 days), to receive additional pain medication, for the acute post-op pain. However, in this case, you are responsible for picking up a copy of our "Post-op Pain Management for Surgeons" handout, and giving it to your surgeon or dentist. This document is available at our office, and does not require an appointment to obtain it. Simply go to our office during business hours (Monday-Thursday from 8:00 AM to 4:00 PM) (Friday 8:00 AM to 12:00 Noon) or if you have a scheduled appointment with [place], prior to your surgery, and ask for it by name. In addition, you will need to provide [place] with your name, name of your surgeon, type of surgery, and date of procedure or surgery.  *Opioid medications include: morphine, codeine, oxycodone, oxymorphone, hydrocodone, hydromorphone, meperidine, tramadol, tapentadol, buprenorphine, fentanyl, methadone. **Benzodiazepine medications include: diazepam (Valium), alprazolam (Xanax), clonazepam (Klonopine), lorazepam (Ativan), clorazepate (Tranxene), chlordiazepoxide (Librium), estazolam (Prosom), oxazepam (Serax), temazepam (Restoril), triazolam (Halcion) (Last updated: 01/16/2018) ____________________________________________________________________________________________   BMI Assessment: Estimated body mass index is 33.79 kg/m as calculated from the following:   Height as of this encounter: 5' (1.524 m).   Weight as of this encounter: 173 lb (78.5 kg).  BMI interpretation table: BMI level Category Range association with higher incidence  of chronic pain  <18 kg/m2 Underweight   18.5-24.9 kg/m2 Ideal body weight   25-29.9 kg/m2 Overweight Increased incidence by 20%  30-34.9 kg/m2 Obese (Class I) Increased incidence by 68%  35-39.9 kg/m2 Severe obesity (Class II) Increased incidence by 136%  >40 kg/m2 Extreme obesity (Class III) Increased incidence by 254%   BMI Readings from Last 4 Encounters:  02/17/18 33.79 kg/m  11/25/17 34.18 kg/m  08/19/17 35.15 kg/m  07/22/17 35.74 kg/m   Wt Readings from Last 4 Encounters:  02/17/18 173 lb (78.5 kg)  11/25/17 175 lb (79.4 kg)  08/19/17 180 lb (81.6 kg)  07/22/17 183 lb (83 kg)

## 2018-02-17 NOTE — Progress Notes (Signed)
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 the written instructions to make sure they are clear and accurate. Make sure that you are given enough medication to last until your next medication refill appointment. 5. Taking Medication: Take medication as prescribed. Never take more pills than instructed. Never take medication more frequently than prescribed. Taking less pills or less frequently is permitted and encouraged, when it comes to controlled substances (written prescriptions).  6. Inform other Doctors: Always inform, all of your healthcare providers, of all the medications you take. 7. Pain Medication from other Providers: You are not allowed to accept any additional pain medication from any other Doctor or Healthcare provider. There are two exceptions to this rule. (see below) In the event that you require additional pain medication, you are responsible for notifying us, as stated below. 8. Medication Agreement: You are responsible for  carefully reading and following our Medication Agreement. This must be signed before receiving any prescriptions from our practice. Safely store a copy of your signed Agreement. Violations to the Agreement will result in no further prescriptions. (Additional copies of our Medication Agreement are available upon request.) 9. Laws, Rules, & Regulations: All patients are expected to follow all Federal and [employer], [employer], Rules, [employer]. Ignorance of the Laws does not constitute a valid excuse. The use of any illegal substances is prohibited. 10. Adopted CDC guidelines & recommendations: Target dosing levels will be at or below 60 MME/day. Use of benzodiazepines** is not recommended.  Exceptions: There are only two exceptions to the rule of not receiving pain medications from other Healthcare Providers. 1. Exception #1 (Emergencies): In the event of an emergency (i.e.: accident requiring emergency care), you are allowed to receive additional pain medication. However, you are responsible for: As soon as you are able, call our office [phone], at any time of the day or night, and leave a message stating your name, the date and nature of the emergency, and the name and dose of the medication prescribed. In the event that your call is answered by a member of our staff, make sure to document and save the date, time, and the name of the person that took your information.  2. Exception #2 (Planned Surgery): In the event that you are scheduled by another doctor or dentist to have any type of surgery or procedure, you are allowed (for a period no  longer than 30 days), to receive additional pain medication, for the acute post-op pain. However, in this case, you are responsible for picking up a copy of our "Post-op Pain Management for Surgeons" handout, and giving it to your surgeon or dentist. This document is available at our office, and does not require an appointment to obtain it. Simply go to our  office during business hours (Monday-Thursday from 8:00 AM to 4:00 PM) (Friday 8:00 AM to 12:00 Noon) or if you have a scheduled appointment with [place], prior to your surgery, and ask for it by name. In addition, you will need to provide [place] with your name, name of your surgeon, type of surgery, and date of procedure or surgery.  *Opioid medications include: morphine, codeine, oxycodone, oxymorphone, hydrocodone, hydromorphone, meperidine, tramadol, tapentadol, buprenorphine, fentanyl, methadone. **Benzodiazepine medications include: diazepam (Valium), alprazolam (Xanax), clonazepam (Klonopine), lorazepam (Ativan), clorazepate (Tranxene), chlordiazepoxide (Librium), estazolam (Prosom), oxazepam (Serax), temazepam (Restoril), triazolam (Halcion) (Last updated: 01/16/2018) ____________________________________________________________________________________________   BMI Assessment: Estimated body mass index is 33.79 kg/m as calculated from the following:   Height as of this encounter: 5' (1.524 m).   Weight as of this encounter: 173 lb (78.5 kg).  BMI interpretation table: BMI level Category Range association with higher incidence of chronic pain  <18 kg/m2 Underweight   18.5-24.9 kg/m2 Ideal body weight   25-29.9 kg/m2 Overweight Increased incidence by 20%  30-34.9 kg/m2 Obese (Class I) Increased incidence by 68%  35-39.9 kg/m2 Severe obesity (Class II) Increased incidence by 136%  >40 kg/m2 Extreme obesity (Class III) Increased incidence by 254%   BMI Readings from Last 4 Encounters:  02/17/18 33.79 kg/m  11/25/17 34.18 kg/m  08/19/17 35.15 kg/m  07/22/17 35.74 kg/m   Wt Readings from Last 4 Encounters:  02/17/18 173 lb (78.5 kg)  11/25/17 175 lb (79.4 kg)  08/19/17 180 lb (81.6 kg)  07/22/17 183 lb (83 kg)

## 2018-02-17 NOTE — Progress Notes (Signed)
Nursing Pain Medication Assessment:  Safety precautions to be maintained throughout the outpatient stay will include: orient to surroundings, keep bed in low position, maintain call bell within reach at all times, provide assistance with transfer out of bed and ambulation.  Medication Inspection Compliance: Pill count conducted under aseptic conditions, in front of the patient. Neither the pills nor the bottle was removed from the patient's sight at any time. Once count was completed pills were immediately returned to the patient in their original bottle.  Medication: Hydrocodone/APAP Pill/Patch Count: 33 of 90 pills remain Pill/Patch Appearance: Markings consistent with prescribed medication Bottle Appearance: Standard pharmacy container. Clearly labeled. Filled Date: 03 / 17 / 2019 Last Medication intake:  Today

## 2018-02-21 LAB — TOXASSURE SELECT 13 (MW), URINE

## 2018-05-19 ENCOUNTER — Ambulatory Visit: Payer: Medicare Other | Attending: Nurse Practitioner | Admitting: Nurse Practitioner

## 2018-05-19 ENCOUNTER — Encounter: Payer: Self-pay | Admitting: Nurse Practitioner

## 2018-05-19 VITALS — BP 111/67 | HR 75 | Temp 98.5°F | Resp 16 | Ht 60.0 in | Wt 166.0 lb

## 2018-05-19 DIAGNOSIS — M25551 Pain in right hip: Secondary | ICD-10-CM | POA: Insufficient documentation

## 2018-05-19 DIAGNOSIS — I10 Essential (primary) hypertension: Secondary | ICD-10-CM | POA: Insufficient documentation

## 2018-05-19 DIAGNOSIS — M79641 Pain in right hand: Secondary | ICD-10-CM | POA: Diagnosis not present

## 2018-05-19 DIAGNOSIS — M25571 Pain in right ankle and joints of right foot: Secondary | ICD-10-CM | POA: Diagnosis not present

## 2018-05-19 DIAGNOSIS — G8929 Other chronic pain: Secondary | ICD-10-CM

## 2018-05-19 DIAGNOSIS — E7800 Pure hypercholesterolemia, unspecified: Secondary | ICD-10-CM | POA: Insufficient documentation

## 2018-05-19 DIAGNOSIS — N993 Prolapse of vaginal vault after hysterectomy: Secondary | ICD-10-CM | POA: Diagnosis not present

## 2018-05-19 DIAGNOSIS — K5669 Other partial intestinal obstruction: Secondary | ICD-10-CM | POA: Diagnosis not present

## 2018-05-19 DIAGNOSIS — Z5181 Encounter for therapeutic drug level monitoring: Secondary | ICD-10-CM | POA: Insufficient documentation

## 2018-05-19 DIAGNOSIS — J449 Chronic obstructive pulmonary disease, unspecified: Secondary | ICD-10-CM | POA: Insufficient documentation

## 2018-05-19 DIAGNOSIS — K430 Incisional hernia with obstruction, without gangrene: Secondary | ICD-10-CM | POA: Diagnosis not present

## 2018-05-19 DIAGNOSIS — M47816 Spondylosis without myelopathy or radiculopathy, lumbar region: Secondary | ICD-10-CM | POA: Diagnosis not present

## 2018-05-19 DIAGNOSIS — M549 Dorsalgia, unspecified: Secondary | ICD-10-CM | POA: Insufficient documentation

## 2018-05-19 DIAGNOSIS — Z79891 Long term (current) use of opiate analgesic: Secondary | ICD-10-CM | POA: Insufficient documentation

## 2018-05-19 DIAGNOSIS — Z9884 Bariatric surgery status: Secondary | ICD-10-CM | POA: Diagnosis not present

## 2018-05-19 DIAGNOSIS — G894 Chronic pain syndrome: Secondary | ICD-10-CM

## 2018-05-19 DIAGNOSIS — K5903 Drug induced constipation: Secondary | ICD-10-CM | POA: Insufficient documentation

## 2018-05-19 DIAGNOSIS — G5601 Carpal tunnel syndrome, right upper limb: Secondary | ICD-10-CM | POA: Diagnosis not present

## 2018-05-19 DIAGNOSIS — Z813 Family history of other psychoactive substance abuse and dependence: Secondary | ICD-10-CM | POA: Insufficient documentation

## 2018-05-19 DIAGNOSIS — Z9049 Acquired absence of other specified parts of digestive tract: Secondary | ICD-10-CM | POA: Insufficient documentation

## 2018-05-19 DIAGNOSIS — Z79811 Long term (current) use of aromatase inhibitors: Secondary | ICD-10-CM | POA: Insufficient documentation

## 2018-05-19 DIAGNOSIS — K219 Gastro-esophageal reflux disease without esophagitis: Secondary | ICD-10-CM | POA: Insufficient documentation

## 2018-05-19 DIAGNOSIS — M069 Rheumatoid arthritis, unspecified: Secondary | ICD-10-CM | POA: Diagnosis not present

## 2018-05-19 DIAGNOSIS — Z91041 Radiographic dye allergy status: Secondary | ICD-10-CM | POA: Insufficient documentation

## 2018-05-19 DIAGNOSIS — E119 Type 2 diabetes mellitus without complications: Secondary | ICD-10-CM | POA: Diagnosis not present

## 2018-05-19 DIAGNOSIS — M479 Spondylosis, unspecified: Secondary | ICD-10-CM | POA: Insufficient documentation

## 2018-05-19 DIAGNOSIS — F329 Major depressive disorder, single episode, unspecified: Secondary | ICD-10-CM | POA: Insufficient documentation

## 2018-05-19 DIAGNOSIS — Z833 Family history of diabetes mellitus: Secondary | ICD-10-CM | POA: Insufficient documentation

## 2018-05-19 DIAGNOSIS — Z7984 Long term (current) use of oral hypoglycemic drugs: Secondary | ICD-10-CM | POA: Insufficient documentation

## 2018-05-19 DIAGNOSIS — Z90710 Acquired absence of both cervix and uterus: Secondary | ICD-10-CM | POA: Insufficient documentation

## 2018-05-19 DIAGNOSIS — Z6832 Body mass index (BMI) 32.0-32.9, adult: Secondary | ICD-10-CM | POA: Diagnosis not present

## 2018-05-19 DIAGNOSIS — Z8249 Family history of ischemic heart disease and other diseases of the circulatory system: Secondary | ICD-10-CM | POA: Insufficient documentation

## 2018-05-19 DIAGNOSIS — Z79899 Other long term (current) drug therapy: Secondary | ICD-10-CM | POA: Insufficient documentation

## 2018-05-19 MED ORDER — HYDROCODONE-ACETAMINOPHEN 10-325 MG PO TABS: 1.0000 | ORAL_TABLET | Freq: Three times a day (TID) | ORAL | 0 refills | Status: DC | PRN

## 2018-05-19 MED ORDER — GABAPENTIN 100 MG PO CAPS: 100.0000 mg | ORAL_CAPSULE | Freq: Three times a day (TID) | ORAL | 2 refills | Status: DC

## 2018-05-19 NOTE — Patient Instructions (Addendum)
____________________________________________________________________________________________  Medication Rules  Applies to: All patients receiving prescriptions (written or electronic).  Pharmacy of record: Pharmacy where electronic prescriptions will be sent. If written prescriptions are taken to a different pharmacy, please inform the nursing staff. The pharmacy listed in the electronic medical record should be the one where you would like electronic prescriptions to be sent.  Prescription refills: Only during scheduled appointments. Applies to both, written and electronic prescriptions.  NOTE: The following applies primarily to controlled substances (Opioid* Pain Medications).   Patient's responsibilities: 1. Pain Pills: Bring all pain pills to every appointment (except for procedure appointments). 2. Pill Bottles: Bring pills in original pharmacy bottle. Always bring newest bottle. Bring bottle, even if empty. 3. Medication refills: You are responsible for knowing and keeping track of what medications you need refilled. The day before your appointment, write a list of all prescriptions that need to be refilled. Bring that list to your appointment and give it to the admitting nurse. Prescriptions will be written only during appointments. If you forget a medication, it will not be "Called in", "Faxed", or "electronically sent". You will need to get another appointment to get these prescribed. 4. Prescription Accuracy: You are responsible for carefully inspecting your prescriptions before leaving our office. Have the discharge nurse carefully go over each prescription with you, before taking them home. Make sure that your name is accurately spelled, that your address is correct. Check the name and dose of your medication to make sure it is accurate. Check the number of pills, and the written instructions to make sure they are clear and accurate. Make sure that you are given enough medication to last  until your next medication refill appointment. 5. Taking Medication: Take medication as prescribed. Never take more pills than instructed. Never take medication more frequently than prescribed. Taking less pills or less frequently is permitted and encouraged, when it comes to controlled substances (written prescriptions).  6. Inform other Doctors: Always inform, all of your healthcare providers, of all the medications you take. 7. Pain Medication from other Providers: You are not allowed to accept any additional pain medication from any other Doctor or Healthcare provider. There are two exceptions to this rule. (see below) In the event that you require additional pain medication, you are responsible for notifying us, as stated below. 8. Medication Agreement: You are responsible for carefully reading and following our Medication Agreement. This must be signed before receiving any prescriptions from our practice. Safely store a copy of your signed Agreement. Violations to the Agreement will result in no further prescriptions. (Additional copies of our Medication Agreement are available upon request.) 9. Laws, Rules, & Regulations: All patients are expected to follow all Federal and State Laws, Statutes, Rules, & Regulations. Ignorance of the Laws does not constitute a valid excuse. The use of any illegal substances is prohibited. 10. Adopted CDC guidelines & recommendations: Target dosing levels will be at or below 60 MME/day. Use of benzodiazepines** is not recommended.  Exceptions: There are only two exceptions to the rule of not receiving pain medications from other Healthcare Providers. 1. Exception #1 (Emergencies): In the event of an emergency (i.e.: accident requiring emergency care), you are allowed to receive additional pain medication. However, you are responsible for: As soon as you are able, call our office [phone], at any time of the day or night, and leave a message stating your name, the  date and nature of the emergency, and the name and dose of the medication   prescribed. In the event that your call is answered by a member of our staff, make sure to document and save the date, time, and the name of the person that took your information.  2. Exception #2 (Planned Surgery): In the event that you are scheduled by another doctor or dentist to have any type of surgery or procedure, you are allowed (for a period no longer than 30 days), to receive additional pain medication, for the acute post-op pain. However, in this case, you are responsible for picking up a copy of our "Post-op Pain Management for Surgeons" handout, and giving it to your surgeon or dentist. This document is available at our office, and does not require an appointment to obtain it. Simply go to our office during business hours (Monday-Thursday from 8:00 AM to 4:00 PM) (Friday 8:00 AM to 12:00 Noon) or if you have a scheduled appointment with [place], prior to your surgery, and ask for it by name. In addition, you will need to provide [place] with your name, name of your surgeon, type of surgery, and date of procedure or surgery.  *Opioid medications include: morphine, codeine, oxycodone, oxymorphone, hydrocodone, hydromorphone, meperidine, tramadol, tapentadol, buprenorphine, fentanyl, methadone. **Benzodiazepine medications include: diazepam (Valium), alprazolam (Xanax), clonazepam (Klonopine), lorazepam (Ativan), clorazepate (Tranxene), chlordiazepoxide (Librium), estazolam (Prosom), oxazepam (Serax), temazepam (Restoril), triazolam (Halcion) (Last updated: 01/16/2018) ____________________________________________________________________________________________   BMI Assessment: Estimated body mass index is 32.42 kg/m as calculated from the following:   Height as of this encounter: 5' (1.524 m).   Weight as of this encounter: 166 lb (75.3 kg).  BMI interpretation table: BMI level Category Range association with higher incidence  of chronic pain  <18 kg/m2 Underweight   18.5-24.9 kg/m2 Ideal body weight   25-29.9 kg/m2 Overweight Increased incidence by 20%  30-34.9 kg/m2 Obese (Class I) Increased incidence by 68%  35-39.9 kg/m2 Severe obesity (Class II) Increased incidence by 136%  >40 kg/m2 Extreme obesity (Class III) Increased incidence by 254%   Patient's current BMI Ideal Body weight  Body mass index is 32.42 kg/m. Ideal body weight: 45.5 kg (100 lb 4.9 oz) Adjusted ideal body weight: 57.4 kg (126 lb 9.4 oz)   BMI Readings from Last 4 Encounters:  05/19/18 32.42 kg/m  02/17/18 33.79 kg/m  11/25/17 34.18 kg/m  08/19/17 35.15 kg/m   Wt Readings from Last 4 Encounters:  05/19/18 166 lb (75.3 kg)  02/17/18 173 lb (78.5 kg)  11/25/17 175 lb (79.4 kg)  08/19/17 180 lb (81.6 kg)

## 2018-05-19 NOTE — Progress Notes (Signed)
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 prior pain experience may influence pain perception and show large inter-individual differences that may also be affected by time variables.  Patient instructions provided during this appointment: Patient Instructions   ____________________________________________________________________________________________  Medication Rules  Applies to: All patients receiving prescriptions (written or electronic).  Pharmacy of record: Pharmacy where electronic prescriptions will be sent. If written prescriptions are taken to a different pharmacy, please inform the nursing staff. The pharmacy listed in the electronic medical record should be the one where you would like electronic prescriptions to be sent.  Prescription refills: Only during scheduled appointments. Applies to both, written and electronic prescriptions.  NOTE: The following applies primarily to controlled substances (Opioid* Pain  Medications).   Patient's responsibilities: 1. Pain Pills: Bring all pain pills to every appointment (except for procedure appointments). 2. Pill Bottles: Bring pills in original pharmacy bottle. Always bring newest bottle. Bring bottle, even if empty. 3. Medication refills: You are responsible for knowing and keeping track of what medications you need refilled. The day before your appointment, write a list of all prescriptions that need to be refilled. Bring that list to your appointment and give it to the admitting nurse. Prescriptions will be written only during appointments. If you forget a medication, it will not be "Called in", "Faxed", or "electronically sent". You will need to get another appointment to get these prescribed. 4. Prescription Accuracy: You are responsible for carefully inspecting your prescriptions before leaving our office. Have the discharge nurse carefully go over each prescription with you, before taking them home. Make sure that your name is accurately spelled, that your address is correct. Check the name and dose of your medication to make sure it is accurate. Check the number of pills, and the written instructions to make sure they are clear and accurate. Make sure that you are given enough medication to last until your  next medication refill appointment. 5. Taking Medication: Take medication as prescribed. Never take more pills than instructed. Never take medication more frequently than prescribed. Taking less pills or less frequently is permitted and encouraged, when it comes to controlled substances (written prescriptions).  6. Inform other Doctors: Always inform, all of your healthcare providers, of all the medications you take. 7. Pain Medication from other Providers: You are not allowed to accept any additional pain medication from any other Doctor or Healthcare provider. There are two exceptions to this rule. (see below) In the event that you require additional pain  medication, you are responsible for notifying us, as stated below. 8. Medication Agreement: You are responsible for carefully reading and following our Medication Agreement. This must be signed before receiving any prescriptions from our practice. Safely store a copy of your signed Agreement. Violations to the Agreement will result in no further prescriptions. (Additional copies of our Medication Agreement are available upon request.) 9. Laws, Rules, & Regulations: All patients are expected to follow all Federal and [employer], [employer], Rules, [employer]. Ignorance of the Laws does not constitute a valid excuse. The use of any illegal substances is prohibited. 10. Adopted CDC guidelines & recommendations: Target dosing levels will be at or below 60 MME/day. Use of benzodiazepines** is not recommended.  Exceptions: There are only two exceptions to the rule of not receiving pain medications from other Healthcare Providers. 1. Exception #1 (Emergencies): In the event of an emergency (i.e.: accident requiring emergency care), you are allowed to receive additional pain medication. However, you are responsible for: As soon as you are able, call our office [phone], at any time of the day or night, and leave a message stating your name, the date and nature of the emergency, and the name and dose of the medication prescribed. In the event that your call is answered by a member of our staff, make sure to document and save the date, time, and the name of the person that took your information.  2. Exception #2 (Planned Surgery): In the event that you are scheduled by another doctor or dentist to have any type of surgery or procedure, you are allowed (for a period no longer than 30 days), to receive additional pain medication, for the acute post-op pain. However, in this case, you are responsible for picking up a copy of our "Post-op Pain Management for Surgeons" handout, and giving it to your surgeon or  dentist. This document is available at our office, and does not require an appointment to obtain it. Simply go to our office during business hours (Monday-Thursday from 8:00 AM to 4:00 PM) (Friday 8:00 AM to 12:00 Noon) or if you have a scheduled appointment with [place], prior to your surgery, and ask for it by name. In addition, you will need to provide [place] with your name, name of your surgeon, type of surgery, and date of procedure or surgery.  *Opioid medications include: morphine, codeine, oxycodone, oxymorphone, hydrocodone, hydromorphone, meperidine, tramadol, tapentadol, buprenorphine, fentanyl, methadone. **Benzodiazepine medications include: diazepam (Valium), alprazolam (Xanax), clonazepam (Klonopine), lorazepam (Ativan), clorazepate (Tranxene), chlordiazepoxide (Librium), estazolam (Prosom), oxazepam (Serax), temazepam (Restoril), triazolam (Halcion) (Last updated: 01/16/2018) ____________________________________________________________________________________________   BMI Assessment: Estimated body mass index is 32.42 kg/m as calculated from the following:   Height as of this encounter: 5' (1.524 m).   Weight as of this encounter: 166 lb (75.3 kg).  BMI interpretation table: BMI level Category Range association with higher incidence of chronic pain  <18 kg/m2  Underweight   18.5-24.9 kg/m2 Ideal body weight   25-29.9 kg/m2 Overweight Increased incidence by 20%  30-34.9 kg/m2 Obese (Class I) Increased incidence by 68%  35-39.9 kg/m2 Severe obesity (Class II) Increased incidence by 136%  >40 kg/m2 Extreme obesity (Class III) Increased incidence by 254%   Patient's current BMI Ideal Body weight  Body mass index is 32.42 kg/m. Ideal body weight: 45.5 kg (100 lb 4.9 oz) Adjusted ideal body weight: 57.4 kg (126 lb 9.4 oz)   BMI Readings from Last 4 Encounters:  05/19/18 32.42 kg/m  02/17/18 33.79 kg/m  11/25/17 34.18 kg/m  08/19/17 35.15 kg/m   Wt Readings from Last 4  Encounters:  05/19/18 166 lb (75.3 kg)  02/17/18 173 lb (78.5 kg)  11/25/17 175 lb (79.4 kg)  08/19/17 180 lb (81.6 kg)

## 2018-05-19 NOTE — Progress Notes (Signed)
Nursing Pain Medication Assessment:  Safety precautions to be maintained throughout the outpatient stay will include: orient to surroundings, keep bed in low position, maintain call bell within reach at all times, provide assistance with transfer out of bed and ambulation.  Medication Inspection Compliance: Pill count conducted under aseptic conditions, in front of the patient. Neither the pills nor the bottle was removed from the patient's sight at any time. Once count was completed pills were immediately returned to the patient in their original bottle.  Medication: Hydrocodone/APAP Pill/Patch Count: 38 of 90 pills remain Pill/Patch Appearance: Markings consistent with prescribed medication Bottle Appearance: Standard pharmacy container. Clearly labeled. Filled Date: 06 / 14 / 2019 Last Medication intake:  Today

## 2018-08-18 ENCOUNTER — Other Ambulatory Visit: Payer: Self-pay | Admitting: Nurse Practitioner

## 2018-08-19 ENCOUNTER — Encounter: Payer: Medicare Other | Admitting: Nurse Practitioner

## 2018-08-27 ENCOUNTER — Encounter: Payer: Self-pay | Admitting: Nurse Practitioner

## 2018-08-27 ENCOUNTER — Ambulatory Visit: Payer: Medicare Other | Attending: Nurse Practitioner | Admitting: Nurse Practitioner

## 2018-08-27 VITALS — BP 100/62 | HR 83 | Temp 97.6°F | Resp 16 | Ht 62.0 in | Wt 160.0 lb

## 2018-08-27 DIAGNOSIS — G894 Chronic pain syndrome: Secondary | ICD-10-CM | POA: Diagnosis present

## 2018-08-27 DIAGNOSIS — K219 Gastro-esophageal reflux disease without esophagitis: Secondary | ICD-10-CM | POA: Diagnosis not present

## 2018-08-27 DIAGNOSIS — Z833 Family history of diabetes mellitus: Secondary | ICD-10-CM | POA: Diagnosis not present

## 2018-08-27 DIAGNOSIS — Z5181 Encounter for therapeutic drug level monitoring: Secondary | ICD-10-CM | POA: Insufficient documentation

## 2018-08-27 DIAGNOSIS — M79642 Pain in left hand: Secondary | ICD-10-CM | POA: Diagnosis not present

## 2018-08-27 DIAGNOSIS — E119 Type 2 diabetes mellitus without complications: Secondary | ICD-10-CM | POA: Diagnosis not present

## 2018-08-27 DIAGNOSIS — M25551 Pain in right hip: Secondary | ICD-10-CM | POA: Diagnosis not present

## 2018-08-27 DIAGNOSIS — Z8249 Family history of ischemic heart disease and other diseases of the circulatory system: Secondary | ICD-10-CM | POA: Diagnosis not present

## 2018-08-27 DIAGNOSIS — M069 Rheumatoid arthritis, unspecified: Secondary | ICD-10-CM | POA: Diagnosis not present

## 2018-08-27 DIAGNOSIS — Z9884 Bariatric surgery status: Secondary | ICD-10-CM | POA: Diagnosis not present

## 2018-08-27 DIAGNOSIS — F329 Major depressive disorder, single episode, unspecified: Secondary | ICD-10-CM | POA: Insufficient documentation

## 2018-08-27 DIAGNOSIS — Z79891 Long term (current) use of opiate analgesic: Secondary | ICD-10-CM | POA: Insufficient documentation

## 2018-08-27 DIAGNOSIS — M545 Low back pain: Secondary | ICD-10-CM | POA: Insufficient documentation

## 2018-08-27 DIAGNOSIS — Z79899 Other long term (current) drug therapy: Secondary | ICD-10-CM | POA: Insufficient documentation

## 2018-08-27 DIAGNOSIS — M79641 Pain in right hand: Secondary | ICD-10-CM | POA: Insufficient documentation

## 2018-08-27 DIAGNOSIS — M47816 Spondylosis without myelopathy or radiculopathy, lumbar region: Secondary | ICD-10-CM | POA: Diagnosis not present

## 2018-08-27 DIAGNOSIS — Z91041 Radiographic dye allergy status: Secondary | ICD-10-CM | POA: Insufficient documentation

## 2018-08-27 DIAGNOSIS — G5601 Carpal tunnel syndrome, right upper limb: Secondary | ICD-10-CM | POA: Insufficient documentation

## 2018-08-27 DIAGNOSIS — Z791 Long term (current) use of non-steroidal anti-inflammatories (NSAID): Secondary | ICD-10-CM | POA: Insufficient documentation

## 2018-08-27 DIAGNOSIS — D649 Anemia, unspecified: Secondary | ICD-10-CM | POA: Insufficient documentation

## 2018-08-27 DIAGNOSIS — M533 Sacrococcygeal disorders, not elsewhere classified: Secondary | ICD-10-CM | POA: Diagnosis not present

## 2018-08-27 DIAGNOSIS — I10 Essential (primary) hypertension: Secondary | ICD-10-CM | POA: Insufficient documentation

## 2018-08-27 DIAGNOSIS — N993 Prolapse of vaginal vault after hysterectomy: Secondary | ICD-10-CM | POA: Insufficient documentation

## 2018-08-27 DIAGNOSIS — Z9049 Acquired absence of other specified parts of digestive tract: Secondary | ICD-10-CM | POA: Insufficient documentation

## 2018-08-27 DIAGNOSIS — J449 Chronic obstructive pulmonary disease, unspecified: Secondary | ICD-10-CM | POA: Insufficient documentation

## 2018-08-27 MED ORDER — HYDROCODONE-ACETAMINOPHEN 10-325 MG PO TABS: 1.0000 | ORAL_TABLET | Freq: Three times a day (TID) | ORAL | 0 refills | Status: DC | PRN

## 2018-08-27 NOTE — Progress Notes (Signed)
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 available upon request.) 9. Laws, Rules, & Regulations: All patients are expected to follow all Federal and [employer], [employer], Rules, [employer] [facility]. Ignorance of the Laws does not constitute a valid excuse. The use of any illegal substances is prohibited. 10. Adopted CDC guidelines & recommendations: Target dosing levels will be at or below 60 MME/day. Use of benzodiazepines** is not recommended.  Exceptions: There are only two exceptions to the rule of not receiving pain medications from other Healthcare Providers. 1. Exception #1 (Emergencies): In the event of an emergency (i.e.: accident requiring emergency care), you are allowed to receive additional pain medication. However, you are responsible for: As soon as you are able, call our office [phone], at any time of the day or night, and leave a message stating your name, the  date and nature of the emergency, and the name and dose of the medication prescribed. In the event that your call is answered by a member of our staff, make sure to document and save the date, time, and the name of the person that took your information.  2. Exception #2 (Planned Surgery): In the event that you are scheduled by another doctor or dentist to have any type of surgery or procedure, you are allowed (for a period no longer than 30 days), to receive additional pain medication, for the acute post-op pain. However, in this case, you are responsible for picking up a copy of our "Post-op Pain Management for Surgeons" handout, and giving it to your surgeon or dentist. This document is available at our office, and does not require an appointment to obtain it. Simply go to our office during business hours (Monday-Thursday from 8:00 AM to 4:00 PM) (Friday 8:00 AM to 12:00 Noon) or if you have a scheduled appointment with [place], prior to your surgery, and ask for it by name. In addition, you will need to provide [place] with your name, name of your surgeon, type of surgery, and date of procedure or surgery.  *Opioid medications include: morphine, codeine, oxycodone, oxymorphone, hydrocodone, hydromorphone, meperidine, tramadol, tapentadol, buprenorphine, fentanyl, methadone. **Benzodiazepine medications include: diazepam (Valium), alprazolam (Xanax), clonazepam (Klonopine), lorazepam (Ativan), clorazepate (Tranxene), chlordiazepoxide (Librium), estazolam (Prosom), oxazepam (Serax), temazepam (Restoril), triazolam (Halcion) (Last updated: 01/16/2018) ____________________________________________________________________________________________

## 2018-08-27 NOTE — Patient Instructions (Addendum)
You have 3 electronic prescriptions for Hydrocodone. ____________________________________________________________________________________________  Medication Rules  Applies to: All patients receiving prescriptions (written or electronic).  Pharmacy of record: Pharmacy where electronic prescriptions will be sent. If written prescriptions are taken to a different pharmacy, please inform the nursing staff. The pharmacy listed in the electronic medical record should be the one where you would like electronic prescriptions to be sent.  Prescription refills: Only during scheduled appointments. Applies to both, written and electronic prescriptions.  NOTE: The following applies primarily to controlled substances (Opioid* Pain Medications).   Patient's responsibilities: 1. Pain Pills: Bring all pain pills to every appointment (except for procedure appointments). 2. Pill Bottles: Bring pills in original pharmacy bottle. Always bring newest bottle. Bring bottle, even if empty. 3. Medication refills: You are responsible for knowing and keeping track of what medications you need refilled. The day before your appointment, write a list of all prescriptions that need to be refilled. Bring that list to your appointment and give it to the admitting nurse. Prescriptions will be written only during appointments. If you forget a medication, it will not be "Called in", "Faxed", or "electronically sent". You will need to get another appointment to get these prescribed. 4. Prescription Accuracy: You are responsible for carefully inspecting your prescriptions before leaving our office. Have the discharge nurse carefully go over each prescription with you, before taking them home. Make sure that your name is accurately spelled, that your address is correct. Check the name and dose of your medication to make sure it is accurate. Check the number of pills, and the written instructions to make sure they are clear and accurate.  Make sure that you are given enough medication to last until your next medication refill appointment. 5. Taking Medication: Take medication as prescribed. Never take more pills than instructed. Never take medication more frequently than prescribed. Taking less pills or less frequently is permitted and encouraged, when it comes to controlled substances (written prescriptions).  6. Inform other Doctors: Always inform, all of your healthcare providers, of all the medications you take. 7. Pain Medication from other Providers: You are not allowed to accept any additional pain medication from any other Doctor or Healthcare provider. There are two exceptions to this rule. (see below) In the event that you require additional pain medication, you are responsible for notifying us, as stated below. 8. Medication Agreement: You are responsible for carefully reading and following our Medication Agreement. This must be signed before receiving any prescriptions from our practice. Safely store a copy of your signed Agreement. Violations to the Agreement will result in no further prescriptions. (Additional copies of our Medication Agreement are available upon request.) 9. Laws, Rules, & Regulations: All patients are expected to follow all [address] and [employer], Rules, [employer]. Ignorance of the Laws does not constitute a valid excuse. The use of any illegal substances is prohibited. 10. Adopted CDC guidelines & recommendations: Target dosing levels will be at or below 60 MME/day. Use of benzodiazepines** is not recommended.  Exceptions: There are only two exceptions to the rule of not receiving pain medications from other Healthcare Providers. 1. Exception #1 (Emergencies): In the event of an emergency (i.e.: accident requiring emergency care), you are allowed to receive additional pain medication. However, you are responsible for: As soon as you are able, call our office [phone], at any time of the  day or night, and leave a message stating your name, the date and nature of the emergency, and  the name and dose of the medication prescribed. In the event that your call is answered by a member of our staff, make sure to document and save the date, time, and the name of the person that took your information.  2. Exception #2 (Planned Surgery): In the event that you are scheduled by another doctor or dentist to have any type of surgery or procedure, you are allowed (for a period no longer than 30 days), to receive additional pain medication, for the acute post-op pain. However, in this case, you are responsible for picking up a copy of our "Post-op Pain Management for Surgeons" handout, and giving it to your surgeon or dentist. This document is available at our office, and does not require an appointment to obtain it. Simply go to our office during business hours (Monday-Thursday from 8:00 AM to 4:00 PM) (Friday 8:00 AM to 12:00 Noon) or if you have a scheduled appointment with [place], prior to your surgery, and ask for it by name. In addition, you will need to provide [place] with your name, name of your surgeon, type of surgery, and date of procedure or surgery.  *Opioid medications include: morphine, codeine, oxycodone, oxymorphone, hydrocodone, hydromorphone, meperidine, tramadol, tapentadol, buprenorphine, fentanyl, methadone. **Benzodiazepine medications include: diazepam (Valium), alprazolam (Xanax), clonazepam (Klonopine), lorazepam (Ativan), clorazepate (Tranxene), chlordiazepoxide (Librium), estazolam (Prosom), oxazepam (Serax), temazepam (Restoril), triazolam (Halcion) (Last updated: 01/16/2018) ____________________________________________________________________________________________

## 2018-08-27 NOTE — Progress Notes (Signed)
Nursing Pain Medication Assessment:  Safety precautions to be maintained throughout the outpatient stay will include: orient to surroundings, keep bed in low position, maintain call bell within reach at all times, provide assistance with transfer out of bed and ambulation.  Medication Inspection Compliance: Ms. Hamelin did not comply with our request to bring her pills to be counted. She was reminded that bringing the medication bottles, even when empty, is a requirement.  Medication: None brought in. Pill/Patch Count: None available to be counted. Bottle Appearance: No container available. Did not bring bottle(s) to appointment. Filled Date: N/A Last Medication intake:  Yesterday

## 2018-08-31 LAB — TOXASSURE SELECT 13 (MW), URINE

## 2018-09-11 ENCOUNTER — Telehealth: Payer: Self-pay | Admitting: *Deleted

## 2018-09-11 ENCOUNTER — Other Ambulatory Visit: Payer: Self-pay | Admitting: Nurse Practitioner

## 2018-09-11 MED ORDER — GABAPENTIN 100 MG PO CAPS: 100.0000 mg | ORAL_CAPSULE | Freq: Four times a day (QID) | ORAL | 2 refills | Status: DC

## 2018-09-11 NOTE — Telephone Encounter (Signed)
We did not discuss this. She was talking about her weight loss.  I can refill this but what we need to talk about it the Tramadol in her system and not the Norco.  I will discuss that at the next visit.  Gabapentin sent for QID

## 2018-09-12 NOTE — Telephone Encounter (Signed)
Patient notified

## 2018-09-15 MED ORDER — GABAPENTIN 300 MG PO CAPS: 300.0000 mg | ORAL_CAPSULE | Freq: Four times a day (QID) | ORAL | 2 refills | Status: DC

## 2018-10-15 NOTE — Progress Notes (Signed)
Previous Tramadol from abdominal surgery this was early summer.

## 2018-11-27 ENCOUNTER — Ambulatory Visit: Payer: Medicare Other | Attending: Nurse Practitioner | Admitting: Nurse Practitioner

## 2018-11-27 ENCOUNTER — Encounter: Payer: Self-pay | Admitting: Nurse Practitioner

## 2018-11-27 ENCOUNTER — Other Ambulatory Visit: Payer: Self-pay

## 2018-11-27 VITALS — BP 120/73 | HR 70 | Temp 98.4°F | Resp 16 | Ht 60.0 in | Wt 167.0 lb

## 2018-11-27 DIAGNOSIS — M25551 Pain in right hip: Secondary | ICD-10-CM | POA: Diagnosis not present

## 2018-11-27 DIAGNOSIS — M069 Rheumatoid arthritis, unspecified: Secondary | ICD-10-CM | POA: Insufficient documentation

## 2018-11-27 DIAGNOSIS — G894 Chronic pain syndrome: Secondary | ICD-10-CM | POA: Insufficient documentation

## 2018-11-27 DIAGNOSIS — M47816 Spondylosis without myelopathy or radiculopathy, lumbar region: Secondary | ICD-10-CM | POA: Insufficient documentation

## 2018-11-27 MED ORDER — HYDROCODONE-ACETAMINOPHEN 10-325 MG PO TABS: 1.0000 | ORAL_TABLET | Freq: Three times a day (TID) | ORAL | 0 refills | Status: DC | PRN

## 2018-11-27 NOTE — Patient Instructions (Addendum)
____________________________________________________________________________________________  Medication Rules  Purpose: To inform patients, and their family members, of our rules and regulations.  Applies to: All patients receiving prescriptions (written or electronic).  Pharmacy of record: Pharmacy where electronic prescriptions will be sent. If written prescriptions are taken to a different pharmacy, please inform the nursing staff. The pharmacy listed in the electronic medical record should be the one where you would like electronic prescriptions to be sent.  Electronic prescriptions: In compliance with the [place] Strengthen Opioid Misuse Prevention (STOP) Act of 2017 (Session Law 2017-74/H243), effective November 19, 2018, all controlled substances must be electronically prescribed. Calling prescriptions to the pharmacy will cease to exist.  Prescription refills: Only during scheduled appointments. Applies to all prescriptions.  NOTE: The following applies primarily to controlled substances (Opioid* Pain Medications).   Patient's responsibilities: 1. Pain Pills: Bring all pain pills to every appointment (except for procedure appointments). 2. Pill Bottles: Bring pills in original pharmacy bottle. Always bring the newest bottle. Bring bottle, even if empty. 3. Medication refills: You are responsible for knowing and keeping track of what medications you take and those you need refilled. The day before your appointment: write a list of all prescriptions that need to be refilled. The day of the appointment: give the list to the admitting nurse. Prescriptions will be written only during appointments. If you forget a medication: it will not be "Called in", "Faxed", or "electronically sent". You will need to get another appointment to get these prescribed. No early refills. Do not call asking to have your prescription filled early. 4. Prescription Accuracy: You are responsible for  carefully inspecting your prescriptions before leaving our office. Have the discharge nurse carefully go over each prescription with you, before taking them home. Make sure that your name is accurately spelled, that your address is correct. Check the name and dose of your medication to make sure it is accurate. Check the number of pills, and the written instructions to make sure they are clear and accurate. Make sure that you are given enough medication to last until your next medication refill appointment. 5. Taking Medication: Take medication as prescribed. When it comes to controlled substances, taking less pills or less frequently than prescribed is permitted and encouraged. Never take more pills than instructed. Never take medication more frequently than prescribed.  6. Inform other Doctors: Always inform, all of your healthcare providers, of all the medications you take. 7. Pain Medication from other Providers: You are not allowed to accept any additional pain medication from any other Doctor or Healthcare provider. There are two exceptions to this rule. (see below) In the event that you require additional pain medication, you are responsible for notifying us, as stated below. 8. Medication Agreement: You are responsible for carefully reading and following our Medication Agreement. This must be signed before receiving any prescriptions from our practice. Safely store a copy of your signed Agreement. Violations to the Agreement will result in no further prescriptions. (Additional copies of our Medication Agreement are available upon request.) 9. Laws, Rules, & Regulations: All patients are expected to follow all Federal and State Laws, Statutes, Rules, & Regulations. Ignorance of the Laws does not constitute a valid excuse. The use of any illegal substances is prohibited. 10. Adopted CDC guidelines & recommendations: Target dosing levels will be at or below 60 MME/day. Use of benzodiazepines** is not  recommended.  Exceptions: There are only two exceptions to the rule of not receiving pain medications from other Healthcare Providers. 1.   Exception #1 (Emergencies): In the event of an emergency (i.e.: accident requiring emergency care), you are allowed to receive additional pain medication. However, you are responsible for: As soon as you are able, call our office [phone], at any time of the day or night, and leave a message stating your name, the date and nature of the emergency, and the name and dose of the medication prescribed. In the event that your call is answered by a member of our staff, make sure to document and save the date, time, and the name of the person that took your information.  2. Exception #2 (Planned Surgery): In the event that you are scheduled by another doctor or dentist to have any type of surgery or procedure, you are allowed (for a period no longer than 30 days), to receive additional pain medication, for the acute post-op pain. However, in this case, you are responsible for picking up a copy of our "Post-op Pain Management for Surgeons" handout, and giving it to your surgeon or dentist. This document is available at our office, and does not require an appointment to obtain it. Simply go to our office during business hours (Monday-Thursday from 8:00 AM to 4:00 PM) (Friday 8:00 AM to 12:00 Noon) or if you have a scheduled appointment with us, prior to your surgery, and ask for it by name. In addition, you will need to provide us with your name, name of your surgeon, type of surgery, and date of procedure or surgery.  *Opioid medications include: morphine, codeine, oxycodone, oxymorphone, hydrocodone, hydromorphone, meperidine, tramadol, tapentadol, buprenorphine, fentanyl, methadone. **Benzodiazepine medications include: diazepam (Valium), alprazolam (Xanax), clonazepam (Klonopine), lorazepam (Ativan), clorazepate (Tranxene), chlordiazepoxide (Librium), estazolam (Prosom),  oxazepam (Serax), temazepam (Restoril), triazolam (Halcion) (Last updated: 01/16/2018) ____________________________________________________________________________________________    BMI Assessment: Estimated body mass index is 32.61 kg/m as calculated from the following:   Height as of this encounter: 5' (1.524 m).   Weight as of this encounter: 167 lb (75.8 kg).  BMI interpretation table: BMI level Category Range association with higher incidence of chronic pain  <18 kg/m2 Underweight   18.5-24.9 kg/m2 Ideal body weight   25-29.9 kg/m2 Overweight Increased incidence by 20%  30-34.9 kg/m2 Obese (Class I) Increased incidence by 68%  35-39.9 kg/m2 Severe obesity (Class II) Increased incidence by 136%  >40 kg/m2 Extreme obesity (Class III) Increased incidence by 254%   Patient's current BMI Ideal Body weight  Body mass index is 32.61 kg/m. Ideal body weight: 45.5 kg (100 lb 4.9 oz) Adjusted ideal body weight: 57.6 kg (126 lb 15.8 oz)   BMI Readings from Last 4 Encounters:  11/27/18 32.61 kg/m  08/27/18 29.26 kg/m  05/19/18 32.42 kg/m  02/17/18 33.79 kg/m   Wt Readings from Last 4 Encounters:  11/27/18 167 lb (75.8 kg)  08/27/18 160 lb (72.6 kg)  05/19/18 166 lb (75.3 kg)  02/17/18 173 lb (78.5 kg)

## 2018-11-27 NOTE — Progress Notes (Signed)
Nursing Pain Medication Assessment:  Safety precautions to be maintained throughout the outpatient stay will include: orient to surroundings, keep bed in low position, maintain call bell within reach at all times, provide assistance with transfer out of bed and ambulation.  Medication Inspection Compliance: Pill count conducted under aseptic conditions, in front of the patient. Neither the pills nor the bottle was removed from the patient's sight at any time. Once count was completed pills were immediately returned to the patient in their original bottle.  Medication #1: Hydrocodone/APAP Pill/Patch Count: 8 of 90 pills remain Pill/Patch Appearance: Markings consistent with prescribed medication Bottle Appearance: Standard pharmacy container. Clearly labeled. Filled Date: 10/31/18 Last Medication intake:  Today  Medication #2: Tramadol (Ultram) Pill/Patch Count: 65 of 90 pills remain Pill/Patch Appearance: Markings consistent with prescribed medication Bottle Appearance: Standard pharmacy container. Clearly labeled. Filled Date: 11/21/2018 Last Medication intake:  Today

## 2018-11-27 NOTE — Progress Notes (Signed)
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 carefully go over each prescription with you, before taking them home. Make sure that your name is accurately spelled, that your address is correct. Check the name and dose of your medication to make sure it is accurate. Check the number of pills, and the written instructions to make sure they are clear and accurate. Make sure that you are given enough medication to last until your next medication refill appointment. 5. Taking Medication: Take medication as prescribed. When it comes to controlled substances, taking less pills or less frequently than prescribed is permitted and encouraged. Never take more pills than instructed. Never take medication more frequently than prescribed.  6. Inform other Doctors: Always inform, all of your healthcare providers, of all the medications you take. 7. Pain Medication from other  Providers: You are not allowed to accept any additional pain medication from any other Doctor or Healthcare provider. There are two exceptions to this rule. (see below) In the event that you require additional pain medication, you are responsible for notifying us, as stated below. 8. Medication Agreement: You are responsible for carefully reading and following our Medication Agreement. This must be signed before receiving any prescriptions from our practice. Safely store a copy of your signed Agreement. Violations to the Agreement will result in no further prescriptions. (Additional copies of our Medication Agreement are available upon request.) 9. Laws, Rules, & Regulations: All patients are expected to follow all Federal and [employer], [employer], Rules, [employer]. Ignorance of the Laws does not constitute a valid excuse. The use of any illegal substances is prohibited. 10. Adopted CDC guidelines & recommendations: Target dosing levels will be at or below 60 MME/day. Use of benzodiazepines** is not recommended.  Exceptions: There are only two exceptions to the rule of not receiving pain medications from other Healthcare Providers. 1. Exception #1 (Emergencies): In the event of an emergency (i.e.: accident requiring emergency care), you are allowed to receive additional pain medication. However, you are responsible for: As soon as you are able, call our office [phone], at any time of the day or night, and leave a message stating your name, the date and nature of the emergency, and the name and dose of the medication prescribed. In the event that your call is answered by a member of our staff, make sure to document and save the date, time, and the name of the person that took your information.  2. Exception #2 (Planned Surgery): In the event that you are scheduled by another doctor or dentist to have any type of surgery or procedure, you are allowed (for a period no longer than 30 days), to receive  additional  pain medication, for the acute post-op pain. However, in this case, you are responsible for picking up a copy of our "Post-op Pain Management for Surgeons" handout, and giving it to your surgeon or dentist. This document is available at our office, and does not require an appointment to obtain it. Simply go to our office during business hours (Monday-Thursday from 8:00 AM to 4:00 PM) (Friday 8:00 AM to 12:00 Noon) or if you have a scheduled appointment with [place], prior to your surgery, and ask for it by name. In addition, you will need to provide [place] with your name, name of your surgeon, type of surgery, and date of procedure or surgery.  *Opioid medications include: morphine, codeine, oxycodone, oxymorphone, hydrocodone, hydromorphone, meperidine, tramadol, tapentadol, buprenorphine, fentanyl, methadone. **Benzodiazepine medications include: diazepam (Valium), alprazolam (Xanax), clonazepam (Klonopine), lorazepam (Ativan), clorazepate (Tranxene), chlordiazepoxide (Librium), estazolam (Prosom), oxazepam (Serax), temazepam (Restoril), triazolam (Halcion) (Last updated: 01/16/2018) ____________________________________________________________________________________________    BMI Assessment: Estimated body mass index is 32.61 kg/m as calculated from the following:   Height as of this encounter: 5' (1.524 m).   Weight as of this encounter: 167 lb (75.8 kg).  BMI interpretation table: BMI level Category Range association with higher incidence of chronic pain  <18 kg/m2 Underweight   18.5-24.9 kg/m2 Ideal body weight   25-29.9 kg/m2 Overweight Increased incidence by 20%  30-34.9 kg/m2 Obese (Class I) Increased incidence by 68%  35-39.9 kg/m2 Severe obesity (Class II) Increased incidence by 136%  >40 kg/m2 Extreme obesity (Class III) Increased incidence by 254%   Patient's current BMI Ideal Body weight  Body mass index is 32.61 kg/m. Ideal body weight: 45.5 kg (100 lb 4.9 oz) Adjusted  ideal body weight: 57.6 kg (126 lb 15.8 oz)   BMI Readings from Last 4 Encounters:  11/27/18 32.61 kg/m  08/27/18 29.26 kg/m  05/19/18 32.42 kg/m  02/17/18 33.79 kg/m   Wt Readings from Last 4 Encounters:  11/27/18 167 lb (75.8 kg)  08/27/18 160 lb (72.6 kg)  05/19/18 166 lb (75.3 kg)  02/17/18 173 lb (78.5 kg)

## 2018-12-22 ENCOUNTER — Other Ambulatory Visit: Payer: Self-pay | Admitting: Nurse Practitioner

## 2018-12-22 ENCOUNTER — Telehealth: Payer: Self-pay | Admitting: Pain Medicine

## 2018-12-22 DIAGNOSIS — M47816 Spondylosis without myelopathy or radiculopathy, lumbar region: Secondary | ICD-10-CM

## 2018-12-22 NOTE — Telephone Encounter (Signed)
Order has been placed for lumbar facet nerve block

## 2018-12-22 NOTE — Telephone Encounter (Signed)
Please call patient to schedule. I gave her pre procedure instructions.

## 2018-12-22 NOTE — Telephone Encounter (Signed)
Pain in center of lower back, just above the crack. Pain also in right hip, down back of right leg to the knee. What procedure do you suggest?

## 2018-12-22 NOTE — Telephone Encounter (Signed)
Patient states she is having increased pain and would like to be seen for a procedure. Last prn order I can see is from 2018. Please call patient to discuss the pain and determine what kind of procedure she is needing to help with her pain.  Thank you

## 2018-12-24 NOTE — Telephone Encounter (Signed)
Done

## 2018-12-25 ENCOUNTER — Ambulatory Visit
Admission: RE | Admit: 2018-12-25 | Discharge: 2018-12-25 | Disposition: A | Discharge status: Home / Self Care | Payer: Medicare Other | Source: Ambulatory Visit | Attending: Pain Medicine | Admitting: Pain Medicine

## 2018-12-25 ENCOUNTER — Ambulatory Visit (HOSPITAL_BASED_OUTPATIENT_CLINIC_OR_DEPARTMENT_OTHER): Payer: Medicare Other | Admitting: Pain Medicine

## 2018-12-25 ENCOUNTER — Other Ambulatory Visit: Payer: Self-pay

## 2018-12-25 ENCOUNTER — Encounter: Payer: Self-pay | Admitting: Pain Medicine

## 2018-12-25 VITALS — BP 110/70 | HR 72 | Temp 98.0°F | Resp 16 | Ht 60.0 in | Wt 165.0 lb

## 2018-12-25 DIAGNOSIS — M05741 Rheumatoid arthritis with rheumatoid factor of right hand without organ or systems involvement: Secondary | ICD-10-CM | POA: Diagnosis present

## 2018-12-25 DIAGNOSIS — M545 Low back pain, unspecified: Secondary | ICD-10-CM

## 2018-12-25 DIAGNOSIS — G8929 Other chronic pain: Secondary | ICD-10-CM | POA: Diagnosis present

## 2018-12-25 DIAGNOSIS — M47816 Spondylosis without myelopathy or radiculopathy, lumbar region: Secondary | ICD-10-CM

## 2018-12-25 DIAGNOSIS — M47812 Spondylosis without myelopathy or radiculopathy, cervical region: Secondary | ICD-10-CM | POA: Insufficient documentation

## 2018-12-25 DIAGNOSIS — M05742 Rheumatoid arthritis with rheumatoid factor of left hand without organ or systems involvement: Secondary | ICD-10-CM | POA: Insufficient documentation

## 2018-12-25 DIAGNOSIS — M47817 Spondylosis without myelopathy or radiculopathy, lumbosacral region: Secondary | ICD-10-CM | POA: Diagnosis present

## 2018-12-25 DIAGNOSIS — M5136 Other intervertebral disc degeneration, lumbar region: Secondary | ICD-10-CM | POA: Diagnosis present

## 2018-12-25 DIAGNOSIS — M51369 Other intervertebral disc degeneration, lumbar region without mention of lumbar back pain or lower extremity pain: Secondary | ICD-10-CM

## 2018-12-25 DIAGNOSIS — M5030 Other cervical disc degeneration, unspecified cervical region: Secondary | ICD-10-CM | POA: Insufficient documentation

## 2018-12-25 DIAGNOSIS — M48062 Spinal stenosis, lumbar region with neurogenic claudication: Secondary | ICD-10-CM | POA: Insufficient documentation

## 2018-12-25 DIAGNOSIS — M48061 Spinal stenosis, lumbar region without neurogenic claudication: Secondary | ICD-10-CM | POA: Insufficient documentation

## 2018-12-25 DIAGNOSIS — M542 Cervicalgia: Secondary | ICD-10-CM | POA: Insufficient documentation

## 2018-12-25 MED ORDER — LACTATED RINGERS IV SOLN
1000.0000 mL | Freq: Once | INTRAVENOUS | Status: AC
  Administered 2018-12-25: 1000 mL via INTRAVENOUS

## 2018-12-25 MED ORDER — MIDAZOLAM HCL 5 MG/5ML IJ SOLN
1.0000 mg | INTRAMUSCULAR | Status: DC | PRN
  Administered 2018-12-25: 3 mg via INTRAVENOUS
  Filled 2018-12-25: qty 5

## 2018-12-25 MED ORDER — TRIAMCINOLONE ACETONIDE 40 MG/ML IJ SUSP
80.0000 mg | Freq: Once | INTRAMUSCULAR | Status: AC
  Administered 2018-12-25: 80 mg
  Filled 2018-12-25: qty 2

## 2018-12-25 MED ORDER — ROPIVACAINE HCL 2 MG/ML IJ SOLN
18.0000 mL | Freq: Once | INTRAMUSCULAR | Status: AC
  Administered 2018-12-25: 18 mL via PERINEURAL
  Filled 2018-12-25: qty 20

## 2018-12-25 MED ORDER — LIDOCAINE HCL 2 % IJ SOLN
20.0000 mL | Freq: Once | INTRAMUSCULAR | Status: AC
  Administered 2018-12-25: 400 mg
  Filled 2018-12-25: qty 40

## 2018-12-25 MED ORDER — FENTANYL CITRATE (PF) 100 MCG/2ML IJ SOLN
25.0000 ug | INTRAMUSCULAR | Status: DC | PRN
  Administered 2018-12-25: 50 ug via INTRAVENOUS
  Filled 2018-12-25: qty 2

## 2018-12-25 MED ORDER — LIDOCAINE HCL 2 % IJ SOLN
INTRAMUSCULAR | Status: AC
  Filled 2018-12-25: qty 20

## 2018-12-25 NOTE — Patient Instructions (Addendum)
____________________________________________________________________________________________  Post-Procedure Discharge Instructions  Instructions:  Apply ice: Fill a plastic sandwich bag with crushed ice. Cover it with a small towel and apply to injection site. Apply for 15 minutes then remove x 15 minutes. Repeat sequence on day of procedure, until you go to bed. The purpose is to minimize swelling and discomfort after procedure.  Apply heat: Apply heat to procedure site starting the day following the procedure. The purpose is to treat any soreness and discomfort from the procedure.  Food intake: Start with clear liquids (like water) and advance to regular food, as tolerated.   Physical activities: Keep activities to a minimum for the first 8 hours after the procedure.   Driving: If you have received any sedation, you are not allowed to drive for 24 hours after your procedure.  Blood thinner: Restart your blood thinner 6 hours after your procedure. (Only for those taking blood thinners)  Insulin: As soon as you can eat, you may resume your normal dosing schedule. (Only for those taking insulin)  Infection prevention: Keep procedure site clean and dry.  Post-procedure Pain Diary: Extremely important that this be done correctly and accurately. Recorded information will be used to determine the next step in treatment.  Pain evaluated is that of treated area only. Do not include pain from an untreated area.  Complete every hour, on the hour, for the initial 8 hours. Set an alarm to help you do this part accurately.  Do not go to sleep and have it completed later. It will not be accurate.  Follow-up appointment: Keep your follow-up appointment after the procedure. Usually 2 weeks for most procedures. (6 weeks in the case of radiofrequency.) Bring you pain diary.   Expect:  From numbing medicine (AKA: Local Anesthetics): Numbness or decrease in pain.  Onset: Full effect within 15  minutes of injected.  Duration: It will depend on the type of local anesthetic used. On the average, 1 to 8 hours.   From steroids: Decrease in swelling or inflammation. Once inflammation is improved, relief of the pain will follow.  Onset of benefits: Depends on the amount of swelling present. The more swelling, the longer it will take for the benefits to be seen. In some cases, up to 10 days.  Duration: Steroids will stay in the system x 2 weeks. Duration of benefits will depend on multiple posibilities including persistent irritating factors.  Occasional side-effects: Facial flushing (red, warm cheeks) , cramps (if present, drink Gatorade and take over-the-counter Magnesium 450-500 mg once to twice a day).  From procedure: Some discomfort is to be expected once the numbing medicine wears off. This should be minimal if ice and heat are applied as instructed.  Call if:  You experience numbness and weakness that gets worse with time, as opposed to wearing off.  New onset bowel or bladder incontinence. (This applies to Spinal procedures only)  Emergency Numbers:  Durning business hours (Monday - Thursday, 8:00 AM - 4:00 PM) (Friday, 9:00 AM - 12:00 Noon): [phone]  After hours: [phone] ____________________________________________________________________________________________   Facet Blocks Patient Information  Description: The facets are joints in the spine between the vertebrae.  Like any joints in the body, facets can become irritated and painful.  Arthritis can also effect the facets.  By injecting steroids and local anesthetic in and around these joints, we can temporarily block the nerve supply to them.  Steroids act directly on irritated nerves and tissues to reduce selling and inflammation which often leads   to decreased pain.  Facet blocks may be done anywhere along the spine from the neck to the low back depending upon the location of your pain.   After numbing  the skin with local anesthetic (like Novocaine), a small needle is passed onto the facet joints under x-ray guidance.  You may experience a sensation of pressure while this is being done.  The entire block usually lasts about 15-25 minutes.   Conditions which may be treated by facet blocks:   Low back/buttock pain  Neck/shoulder pain  Certain types of headaches  Preparation for the injection:  1. Do not eat any solid food or dairy products within 8 hours of your appointment. 2. You may drink clear liquid up to 3 hours before appointment.  Clear liquids include water, black coffee, juice or soda.  No milk or cream please. 3. You may take your regular medication, including pain medications, with a sip of water before your appointment.  Diabetics should hold regular insulin (if taken separately) and take 1/2 normal NPH dose the morning of the procedure.  Carry some sugar containing items with you to your appointment. 4. A driver must accompany you and be prepared to drive you home after your procedure. 5. Bring all your current medications with you. 6. An IV may be inserted and sedation may be given at the discretion of the physician. 7. A blood pressure cuff, EKG and other monitors will often be applied during the procedure.  Some patients may need to have extra oxygen administered for a short period. 8. You will be asked to provide medical information, including your allergies and medications, prior to the procedure.  We must know immediately if you are taking blood thinners (like Coumadin/Warfarin) or if you are allergic to IV iodine contrast (dye).  We must know if you could possible be pregnant.  Possible side-effects:   Bleeding from needle site  Infection (rare, may require surgery)  Nerve injury (rare)  Numbness & tingling (temporary)  Difficulty urinating (rare, temporary)  Spinal headache (a headache worse with upright posture)  Light-headedness (temporary)  Pain at  injection site (serveral days)  Decreased blood pressure (rare, temporary)  Weakness in arm/leg (temporary)  Pressure sensation in back/neck (temporary)   Call if you experience:   Fever/chills associated with headache or increased back/neck pain  Headache worsened by an upright position  New onset, weakness or numbness of an extremity below the injection site  Hives or difficulty breathing (go to the emergency room)  Inflammation or drainage at the injection site(s)  Severe back/neck pain greater than usual  New symptoms which are concerning to you  Please note:  Although the local anesthetic injected can often make your back or neck feel good for several hours after the injection, the pain will likely return. It takes 3-7 days for steroids to work.  You may not notice any pain relief for at least one week.  If effective, we will often do a series of 2-3 injections spaced 3-6 weeks apart to maximally decrease your pain.  After the initial series, you may be a candidate for a more permanent nerve block of the facets.  If you have any questions, please call [phone] [facility]  General Discharge Instructions :  If you need to reach your doctor call: Monday-Friday 8:00 am - 4:00 pm at [phone] or toll free [phone].  After clinic hours [phone] to have operator reach doctor.  Bring all   of your medication bottles to all your appointments in the pain clinic.  To cancel or reschedule your appointment with Pain Management please remember to call 24 hours in advance to avoid a fee.  Refer to the educational materials which you have been given on: General Risks, I had my Procedure. Discharge Instructions, Post Sedation.  Post Procedure Instructions:  The drugs you were given will stay in your system until tomorrow, so for the next 24 hours you should not drive, make any legal decisions  or drink any alcoholic beverages.  You may eat anything you prefer, but it is better to start with liquids then soups and crackers, and gradually work up to solid foods.  Please notify your doctor immediately if you have any unusual bleeding, trouble breathing or pain that is not related to your normal pain.  Depending on the type of procedure that was done, some parts of your body may feel week and/or numb.  This usually clears up by tonight or the next day.  Walk with the use of an assistive device or accompanied by an adult for the 24 hours.  You may use ice on the affected area for the first 24 hours.  Put ice in a Ziploc bag and cover with a towel and place against area 15 minutes on 15 minutes off.  You may switch to heat after 24 hours. 

## 2018-12-25 NOTE — Addendum Note (Signed)
Addended by: Delano Metz A on: 12/25/2018 02:38 PM   Modules accepted: Orders

## 2018-12-25 NOTE — Progress Notes (Signed)
Safety precautions to be maintained throughout the outpatient stay will include: orient to surroundings, keep bed in low position, maintain call bell within reach at all times, provide assistance with transfer out of bed and ambulation.  

## 2018-12-25 NOTE — Progress Notes (Addendum)
Patient's Name: Mariah Poole  MRN: [id number]  Referring Provider: Barbette Merino, NP  DOB: 06/26/1946  PCP: Leanna Sato, MD  DOS: 12/25/2018  Note by: Oswaldo Done, MD  Service setting: Ambulatory outpatient  Specialty: Interventional Pain Management  Patient type: Established  Location: [facility] (AMB) Pain Management Facility  Visit type: Interventional Procedure   Primary Reason for Visit: Interventional Pain Management Treatment. CC: Back Pain (right lower)  Procedure:          Anesthesia, Analgesia, Anxiolysis:  Type: Lumbar Facet, Medial Branch Block(s) #1  Primary Purpose: Diagnostic Region: Posterolateral Lumbosacral Spine Level: L2, L3, L4, L5, & S1 Medial Branch Level(s). Injecting these levels blocks the L3-4, L4-5, and L5-S1 lumbar facet joints. Laterality: Bilateral  Type: Moderate (Conscious) Sedation combined with Local Anesthesia Indication(s): Analgesia and Anxiety Route: Intravenous (IV) IV Access: Secured Sedation: Meaningful verbal contact was maintained at all times during the procedure  Local Anesthetic: Lidocaine 1-2%  Position: Prone   Indications: 1. Spondylosis without myelopathy or radiculopathy, lumbosacral region   2. Lumbar facet syndrome (Bilateral) (R>L)   3. DDD (degenerative disc disease), lumbar   4. Chronic low back pain (Primary area of Pain) (Bilateral) (R>L)    Pain Score: Pre-procedure: 0-No pain/10 Post-procedure: 0-No pain/10  Pre-op Assessment:  Mariah Poole is a 73 y.o. (year old), female patient, seen today for interventional treatment. She  has a past surgical history that includes Abdominal hysterectomy; Cataract extraction w/ intraocular lens implant (Right); Gastric bypass; Hernia repair; Cataract extraction w/PHACO (Left, 07/19/2015); Cholecystectomy; and Hernia repair. Mariah Poole has a current medication list which includes the following prescription(s): albuterol, atenolol, vitamin d3, duloxetine, furosemide,  hydrocodone-acetaminophen, hydrocodone-acetaminophen, hydrocodone-acetaminophen, ibuprofen, magnesium oxide, multi-vitamins, pantoprazole, potassium chloride sa, pravastatin, tramadol, turmeric curcumin, vitamin c, and gabapentin, and the following Facility-Administered Medications: fentanyl, lactated ringers, and midazolam. Her primarily concern today is the Back Pain (right lower)  Initial Vital Signs:  Pulse/HCG Rate: 72ECG Heart Rate: 65 Temp: 98 F (36.7 C) Resp: 18 BP: 113/70 SpO2: 100 %  BMI: Estimated body mass index is 32.22 kg/m as calculated from the following:   Height as of this encounter: 5' (1.524 m).   Weight as of this encounter: 165 lb (74.8 kg).  Risk Assessment: Allergies: Reviewed. She is allergic to contrast media [iodinated diagnostic agents] and shellfish allergy.  Allergy Precautions: None required Coagulopathies: Reviewed. None identified.  Blood-thinner therapy: None at this time Active Infection(s): Reviewed. None identified. Mariah Poole is afebrile  Site Confirmation: Mariah Poole was asked to confirm the procedure and laterality before marking the site Procedure checklist: Completed Consent: Before the procedure and under the influence of no sedative(s), amnesic(s), or anxiolytics, the patient was informed of the treatment options, risks and possible complications. To fulfill our ethical and legal obligations, as recommended by the American Medical Association's Code of Ethics, I have informed the patient of my clinical impression; the nature and purpose of the treatment or procedure; the risks, benefits, and possible complications of the intervention; the alternatives, including doing nothing; the risk(s) and benefit(s) of the alternative treatment(s) or procedure(s); and the risk(s) and benefit(s) of doing nothing. The patient was provided information about the general risks and possible complications associated with the procedure. These may include, but are  not limited to: failure to achieve desired goals, infection, bleeding, organ or nerve damage, allergic reactions, paralysis, and death. In addition, the patient was informed of those risks and complications associated to Spine-related procedures, such as failure to  decrease pain; infection (i.e.: Meningitis, epidural or intraspinal abscess); bleeding (i.e.: epidural hematoma, subarachnoid hemorrhage, or any other type of intraspinal or peri-dural bleeding); organ or nerve damage (i.e.: Any type of peripheral nerve, nerve root, or spinal cord injury) with subsequent damage to sensory, motor, and/or autonomic systems, resulting in permanent pain, numbness, and/or weakness of one or several areas of the body; allergic reactions; (i.e.: anaphylactic reaction); and/or death. Furthermore, the patient was informed of those risks and complications associated with the medications. These include, but are not limited to: allergic reactions (i.e.: anaphylactic or anaphylactoid reaction(s)); adrenal axis suppression; blood sugar elevation that in diabetics may result in ketoacidosis or comma; water retention that in patients with history of congestive heart failure may result in shortness of breath, pulmonary edema, and decompensation with resultant heart failure; weight gain; swelling or edema; medication-induced neural toxicity; particulate matter embolism and blood vessel occlusion with resultant organ, and/or nervous system infarction; and/or aseptic necrosis of one or more joints. Finally, the patient was informed that Medicine is not an exact science; therefore, there is also the possibility of unforeseen or unpredictable risks and/or possible complications that may result in a catastrophic outcome. The patient indicated having understood very clearly. We have given the patient no guarantees and we have made no promises. Enough time was given to the patient to ask questions, all of which were answered to the patient's  satisfaction. Mariah Poole has indicated that she wanted to continue with the procedure. Attestation: I, the ordering provider, attest that I have discussed with the patient the benefits, risks, side-effects, alternatives, likelihood of achieving goals, and potential problems during recovery for the procedure that I have provided informed consent. Date  Time: 12/25/2018 12:46 PM  Pre-Procedure Preparation:  Monitoring: As per clinic protocol. Respiration, ETCO2, SpO2, BP, heart rate and rhythm monitor placed and checked for adequate function Safety Precautions: Patient was assessed for positional comfort and pressure points before starting the procedure. Time-out: I initiated and conducted the "Time-out" before starting the procedure, as per protocol. The patient was asked to participate by confirming the accuracy of the "Time Out" information. Verification of the correct person, site, and procedure were performed and confirmed by me, the nursing staff, and the patient. "Time-out" conducted as per Joint Commission's Universal Protocol ([email or web address].01.01.01). Time: 1343  Description of Procedure:          Laterality: Bilateral. The procedure was performed in identical fashion on both sides. Levels:  L2, L3, L4, L5, & S1 Medial Branch Level(s) Area Prepped: Posterior Lumbosacral Region Prepping solution: ChloraPrep (2% chlorhexidine gluconate and 70% isopropyl alcohol) Safety Precautions: Aspiration looking for blood return was conducted prior to all injections. At no point did we inject any substances, as a needle was being advanced. Before injecting, the patient was told to immediately notify me if she was experiencing any new onset of "ringing in the ears, or metallic taste in the mouth". No attempts were made at seeking any paresthesias. Safe injection practices and needle disposal techniques used. Medications properly checked for expiration dates. SDV (single dose vial) medications used. After the completion  of the procedure, all disposable equipment used was discarded in the proper designated medical waste containers. Local Anesthesia: Protocol guidelines were followed. The patient was positioned over the fluoroscopy table. The area was prepped in the usual manner. The time-out was completed. The target area was identified using fluoroscopy. A 12-in long, straight, sterile hemostat was used with fluoroscopic guidance to locate the targets for each level  blocked. Once located, the skin was marked with an approved surgical skin marker. Once all sites were marked, the skin (epidermis, dermis, and hypodermis), as well as deeper tissues (fat, connective tissue and muscle) were infiltrated with a small amount of a short-acting local anesthetic, loaded on a 10cc syringe with a 25G, 1.5-in  Needle. An appropriate amount of time was allowed for local anesthetics to take effect before proceeding to the next step. Local Anesthetic: Lidocaine 2.0% The unused portion of the local anesthetic was discarded in the proper designated containers. Technical explanation of process:  L2 Medial Branch Nerve Block (MBB): The target area for the L2 medial branch is at the junction of the postero-lateral aspect of the superior articular process and the superior, posterior, and medial edge of the transverse process of L3. Under fluoroscopic guidance, a Quincke needle was inserted until contact was made with os over the superior postero-lateral aspect of the pedicular shadow (target area). After negative aspiration for blood, 0.5 mL of the nerve block solution was injected without difficulty or complication. The needle was removed intact. L3 Medial Branch Nerve Block (MBB): The target area for the L3 medial branch is at the junction of the postero-lateral aspect of the superior articular process and the superior, posterior, and medial edge of the transverse process of L4. Under fluoroscopic guidance, a Quincke needle was inserted until  contact was made with os over the superior postero-lateral aspect of the pedicular shadow (target area). After negative aspiration for blood, 0.5 mL of the nerve block solution was injected without difficulty or complication. The needle was removed intact. L4 Medial Branch Nerve Block (MBB): The target area for the L4 medial branch is at the junction of the postero-lateral aspect of the superior articular process and the superior, posterior, and medial edge of the transverse process of L5. Under fluoroscopic guidance, a Quincke needle was inserted until contact was made with os over the superior postero-lateral aspect of the pedicular shadow (target area). After negative aspiration for blood, 0.5 mL of the nerve block solution was injected without difficulty or complication. The needle was removed intact. L5 Medial Branch Nerve Block (MBB): The target area for the L5 medial branch is at the junction of the postero-lateral aspect of the superior articular process and the superior, posterior, and medial edge of the sacral ala. Under fluoroscopic guidance, a Quincke needle was inserted until contact was made with os over the superior postero-lateral aspect of the pedicular shadow (target area). After negative aspiration for blood, 0.5 mL of the nerve block solution was injected without difficulty or complication. The needle was removed intact. S1 Medial Branch Nerve Block (MBB): The target area for the S1 medial branch is at the posterior and inferior 6 o'clock position of the L5-S1 facet joint. Under fluoroscopic guidance, the Quincke needle inserted for the L5 MBB was redirected until contact was made with os over the inferior and postero aspect of the sacrum, at the 6 o' clock position under the L5-S1 facet joint (Target area). After negative aspiration for blood, 0.5 mL of the nerve block solution was injected without difficulty or complication. The needle was removed intact.  Nerve block solution: 0.2%  PF-Ropivacaine + Triamcinolone (40 mg/mL) diluted to a final concentration of 4 mg of Triamcinolone/mL of Ropivacaine The unused portion of the solution was discarded in the proper designated containers. Procedural Needles: 22-gauge, 7-inch, Quincke needles used for all levels.  Once the entire procedure was completed, the treated area was cleaned, making  sure to leave some of the prepping solution back to take advantage of its long term bactericidal properties.   Illustration of the posterior view of the lumbar spine and the posterior neural structures. Laminae of L2 through S1 are labeled. DPRL5, dorsal primary ramus of L5; DPRS1, dorsal primary ramus of S1; DPR3, dorsal primary ramus of L3; FJ, facet (zygapophyseal) joint L3-L4; I, inferior articular process of L4; LB1, lateral branch of dorsal primary ramus of L1; IAB, inferior articular branches from L3 medial branch (supplies L4-L5 facet joint); IBP, intermediate branch plexus; MB3, medial branch of dorsal primary ramus of L3; NR3, third lumbar nerve root; S, superior articular process of L5; SAB, superior articular branches from L4 (supplies L4-5 facet joint also); TP3, transverse process of L3.  Vitals:   12/25/18 1351 12/25/18 1400 12/25/18 1410 12/25/18 1418  BP: 134/84 (!) 152/103 105/60 110/70  Pulse:      Resp: Temp:      SpO2: 100% 100% 100% 100%  Weight:      Height:         Start Time: 1343 hrs. End Time: 1353 hrs.  Imaging Guidance (Spinal):          Type of Imaging Technique: Fluoroscopy Guidance (Spinal) Indication(s): Assistance in needle guidance and placement for procedures requiring needle placement in or near specific anatomical locations not easily accessible without such assistance. Exposure Time: Please see nurses notes. Contrast: None used. Fluoroscopic Guidance: I was personally present during the use of fluoroscopy. "Tunnel Vision Technique" used to obtain the best possible view of the target  area. Parallax error corrected before commencing the procedure. "Direction-depth-direction" technique used to introduce the needle under continuous pulsed fluoroscopy. Once target was reached, antero-posterior, oblique, and lateral fluoroscopic projection used confirm needle placement in all planes. Images permanently stored in EMR. Interpretation: No contrast injected. I personally interpreted the imaging intraoperatively. Adequate needle placement confirmed in multiple planes. Permanent images saved into the patient's record.  Antibiotic Prophylaxis:   Anti-infectives (From admission, onward)   None     Indication(s): None identified  Post-operative Assessment:  Post-procedure Vital Signs:  Pulse/HCG Rate: 7260 Temp: 98 F (36.7 C) Resp: 16 BP: 110/70 SpO2: 100 %  EBL: None  Complications: No immediate post-treatment complications observed by team, or reported by patient.  Note: The patient tolerated the entire procedure well. A repeat set of vitals were taken after the procedure and the patient was kept under observation following institutional policy, for this type of procedure. Post-procedural neurological assessment was performed, showing return to baseline, prior to discharge. The patient was provided with post-procedure discharge instructions, including a section on how to identify potential problems. Should any problems arise concerning this procedure, the patient was given instructions to immediately contact us, at any time, without hesitation. In any case, we plan to contact the patient by telephone for a follow-up status report regarding this interventional procedure.  Comments:  No additional relevant information.  Plan of Care  Interventional therapies: Planned, scheduled, and/or pending:   NOTE: NO RFA until BMI is <35  Not at this time.  If pain persists, especially if it has a lower extremity component, consider repeating 2014 lumbar MRI.   Considering:     Diagnostic bilateral L5 transforaminal ESI #1  Diagnostic right-sided L5-S1 interlaminar LESI #1  Diagnostic bilateral L4 transforaminal ESI #1  Diagnostic right-sided L4-5 interlaminar LESI #1  Diagnostic bilateral L3 transforaminal ESI #1  Diagnostic right-sided L3-4 interlaminar LESI #1  Diagnostic right-sided L2 transforaminal ESI #1    Palliative PRN treatment(s):   Palliative bilateral lumbar facet blocks     Imaging Orders     DG C-Arm 1-60 Min-No Report  Procedure Orders     LUMBAR FACET(MEDIAL BRANCH NERVE BLOCK) MBNB  Medications ordered for procedure: Meds ordered this encounter  Medications  . lidocaine (XYLOCAINE) 2 % (with pres) injection 400 mg  . midazolam (VERSED) 5 MG/5ML injection 1-2 mg    Make sure Flumazenil is available in the pyxis when using this medication. If oversedation occurs, administer 0.2 mg IV over 15 sec. If after 45 sec no response, administer 0.2 mg again over 1 min; may repeat at 1 min intervals; not to exceed 4 doses (1 mg)  . fentaNYL (SUBLIMAZE) injection 25-50 mcg    Make sure Narcan is available in the pyxis when using this medication. In the event of respiratory depression (RR< 8/min): Titrate NARCAN (naloxone) in increments of 0.1 to 0.2 mg IV at 2-3 minute intervals, until desired degree of reversal.  . lactated ringers infusion 1,000 mL  . ropivacaine (PF) 2 mg/mL (0.2%) (NAROPIN) injection 18 mL  . triamcinolone acetonide (KENALOG-40) injection 80 mg   Medications administered: We administered lidocaine, midazolam, fentaNYL, lactated ringers, ropivacaine (PF) 2 mg/mL (0.2%), and triamcinolone acetonide.  See the medical record for exact dosing, route, and time of administration.  Disposition: Discharge home  Discharge Date & Time: 12/25/2018; 1415 hrs.   Physician-requested Follow-up: Return for post-procedure eval (2 wks), w/ Dr. Laban Emperor.  Future Appointments  Date Time Provider Department Center  01/14/2019  1:30 PM Delano Metz, MD [facility] None  02/24/2019 10:30 AM Barbette Merino, NP [facility] None   Primary Care Physician: Leanna Sato, MD Location: [facility] Note by: Oswaldo Done, MD Date: 12/25/2018; Time: 2:38 PM  Disclaimer:  Medicine is not an Visual merchandiser. The only guarantee in medicine is that nothing is guaranteed. It is important to note that the decision to proceed with this intervention was based on the information collected from the patient. The Data and conclusions were drawn from the patient's questionnaire, the interview, and the physical examination. Because the information was provided in large part by the patient, it cannot be guaranteed that it has not been purposely or unconsciously manipulated. Every effort has been made to obtain as much relevant data as possible for this evaluation. It is important to note that the conclusions that lead to this procedure are derived in large part from the available data. Always take into account that the treatment will also be dependent on availability of resources and existing treatment guidelines, considered by other Pain Management Practitioners as being common knowledge and practice, at the time of the intervention. For Medico-Legal purposes, it is also important to point out that variation in procedural techniques and pharmacological choices are the acceptable norm. The indications, contraindications, technique, and results of the above procedure should only be interpreted and judged by a Board-Certified Interventional Pain Specialist with extensive familiarity and expertise in the same exact procedure and technique.

## 2018-12-26 ENCOUNTER — Telehealth: Payer: Self-pay

## 2018-12-26 NOTE — Telephone Encounter (Signed)
Post procedure phone call.  Patient states she is doin good.

## 2019-01-13 NOTE — Progress Notes (Signed)
Patient's Name: Mariah Poole  MRN: [id number]  Referring Provider: Marguerita Merles, MD  DOB: [birth year]  PCP: Marguerita Merles, MD  DOS: 01/14/2019  Note by: Gaspar Cola, MD  Service setting: Ambulatory outpatient  Specialty: Interventional Pain Management  Location: [facility] (AMB) Pain Management Facility    Patient type: Established   Primary Reason(s) for Visit: Encounter for post-procedure evaluation of chronic illness with mild to moderate exacerbation CC: Back Pain (right, lower)  HPI  Mariah Poole is a 73 y.o. year old, female patient, who comes today for a post-procedure evaluation. She has Encounter for therapeutic drug level monitoring; Long term current use of opiate analgesic; Opiate use (30 MME/Day); Chronic pain syndrome; Chronic hip pain (Right); Opiate analgesic use agreement exists; Sacral pain; Morbid obesity ([place]); History of carpal tunnel syndrome, right side; Rheumatoid arthritis ([place]); Depression; Opioid-induced constipation; History of contrast media allergy. (IVP dye); Diabetes mellitus ([place]); Hypertension; Other specified health status; Bariatric surgery status; History of cholecystectomy; Long term prescription opiate use; Allergic to radiographic contrast media; Constipation due to opioid therapy; Administrative encounter; Arthralgia of hip (Right); H/O disease; Prolapse of vaginal vault after hysterectomy; Hieralgia; Chronic low back pain (Primary area of Pain) (Bilateral) (R>L); Lumbar spondylosis; Other partial intestinal obstruction ([place]); Ventral incisional hernia with obstruction; Patient is Jehovah's Witness; S/P gastric bypass; Lumbar facet syndrome (Bilateral) (R>L); Opioid dependence ([place]); Diabetes ([place]); 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 Her primarily concern today is the Back Pain (right, lower)  Pain Assessment: Location: Right, Lower Back Radiating: right upper leg to the knee Duration: Chronic pain Quality: ("numbing") Severity: 4 /10 (subjective, self-reported pain score)  Note: Reported level is compatible with observation.                         When using our objective Pain Scale, levels between 6 and 10/10 are said to belong in an emergency room, as it progressively worsens from a 6/10, described as severely limiting, requiring emergency care not usually available at an outpatient pain management facility. At a 6/10 level, communication becomes difficult and requires great effort. Assistance to reach the emergency department may be required. Facial flushing and profuse sweating along with potentially dangerous increases in heart rate and blood pressure will be evident. Modifying factors: medications, walking BP: 130/61  HR: 67  Mariah Poole comes in today for post-procedure evaluation.  The patient indicates doing much better after the bilateral lumbar facet block #1 she would like to have it repeated.  Although the day of the procedure she was not experiencing a lot of pain, the day before that she was unable to get out of bed because of the pain.  She refers that after the procedure she has not had that kind of event and feels significantly better but she still having some pain.  Because of this she will like to have it repeated.  She is pending to see the rheumatologist.  I hope that they can offer her something for her rheumatoid arthritis since she has not had any formal treatment of this for many years.  Months of  her hands and feet joints  are deformed, secondary to this arthritis.  Further details on both, my assessment(s), as well as the proposed treatment plan, please see below.  Post-Procedure Assessment  12/25/2018 Procedure: Diagnostic bilateral lumbar facet block #1 under fluoroscopic guidance and IV sedation Pre-procedure pain score:  0/10 Post-procedure pain score: 0/10 (100% relief) Influential Factors: BMI: 32.22 kg/m Intra-procedural challenges: None observed.         Assessment challenges: None detected.              Reported side-effects: None.        Post-procedural adverse reactions or complications: None reported         Sedation: Sedation provided. When no sedatives are used, the analgesic levels obtained are directly associated to the effectiveness of the local anesthetics. However, when sedation is provided, the level of analgesia obtained during the initial 1 hour following the intervention, is believed to be the result of a combination of factors. These factors may include, but are not limited to: 1. The effectiveness of the local anesthetics used. 2. The effects of the analgesic(s) and/or anxiolytic(s) used. 3. The degree of discomfort experienced by the patient at the time of the procedure. 4. The patients ability and reliability in recalling and recording the events. 5. The presence and influence of possible secondary gains and/or psychosocial factors. Reported result: Relief experienced during the 1st hour after the procedure: 100 % (Ultra-Short Term Relief)            Interpretative annotation: Clinically appropriate result. Analgesia during this period is likely to be Local Anesthetic and/or IV Sedative (Analgesic/Anxiolytic) related.          Effects of local anesthetic: The analgesic effects attained during this period are directly associated to the localized infiltration of local anesthetics and therefore cary significant diagnostic value as to the etiological location, or anatomical origin, of the  pain. Expected duration of relief is directly dependent on the pharmacodynamics of the local anesthetic used. Long-acting (4-6 hours) anesthetics used.  Reported result: Relief during the next 4 to 6 hour after the procedure: 100 % (Short-Term Relief)            Interpretative annotation: Clinically appropriate result. Analgesia during this period is likely to be Local Anesthetic-related.          Long-term benefit: Defined as the period of time past the expected duration of local anesthetics (1 hour for short-acting and 4-6 hours for long-acting). With the possible exception of prolonged sympathetic blockade from the local anesthetics, benefits during this period are typically attributed to, or associated with, other factors such as analgesic sensory neuropraxia, antiinflammatory effects, or beneficial biochemical changes provided by agents other than the local anesthetics.  Reported result: Extended relief following procedure: 50 % (Long-Term Relief)            Interpretative annotation: Clinically possible results. Good relief. No permanent benefit expected. Inflammation plays a part in the etiology to the pain.          Current benefits: Defined as reported results that persistent at this point in time.   Analgesia: >50 % Ms. Mccord reports improvement of axial symptoms. Function: Somewhat improved ROM: Somewhat improved Interpretative annotation: Recurrence of symptoms. No permanent benefit expected. Effective diagnostic intervention.          Interpretation: Results would suggest a successful diagnostic intervention.                  Plan:  Please see "Plan of Care" for details.                Laboratory Chemistry  Inflammation Markers (CRP: Acute Phase) (ESR: Chronic Phase) Lab Results  Component Value Date   CRP 5.5 (H) 05/20/2017   ESRSEDRATE 40 05/20/2017   LATICACIDVEN 4.0 (H) 11/23/2009                         Renal Markers Lab Results  Component Value Date   BUN 21  05/20/2017   CREATININE 0.73 05/20/2017   BCR 29 (H) 05/20/2017   GFRAA 95 05/20/2017   GFRNONAA 83 05/20/2017                             Hepatic Markers Lab Results  Component Value Date   AST 18 05/20/2017   ALT 9 05/20/2017   ALBUMIN 4.5 05/20/2017                        Note: Lab results reviewed.  Recent Imaging Results   Results for orders placed in visit on 12/25/18  DG C-Arm 1-60 Min-No Report   Narrative Fluoroscopy was utilized by the requesting physician.  No radiographic  interpretation.    Interpretation Report: Fluoroscopy was used during the procedure to assist with needle guidance. The images were interpreted intraoperatively by the requesting physician.  Meds   Current Outpatient Medications:  .  albuterol (PROVENTIL HFA;VENTOLIN HFA) 108 (90 BASE) MCG/ACT inhaler, Inhale 2 puffs into the lungs every 4 (four) hours as needed for wheezing or shortness of breath., Disp: , Rfl:  .  atenolol (TENORMIN) 25 MG tablet, Take 25 mg by mouth daily. , Disp: , Rfl:  .  Cholecalciferol (VITAMIN D3) 2000 UNITS TABS, Take 1 tablet by mouth daily., Disp: , Rfl:  .  DULoxetine (CYMBALTA) 60 MG capsule, Take 60 mg by mouth daily., Disp: , Rfl:  .  furosemide (LASIX) 20 MG tablet, Take 20 mg by mouth daily., Disp: , Rfl:  .  [START ON 01/29/2019] HYDROcodone-acetaminophen (NORCO) 10-325 MG tablet, Take 1 tablet by mouth every 8 (eight) hours as needed for severe pain., Disp: 90 tablet, Rfl: 0 .  HYDROcodone-acetaminophen (NORCO) 10-325 MG tablet, Take 1 tablet by mouth every 8 (eight) hours as needed for severe pain., Disp: 90 tablet, Rfl: 0 .  magnesium oxide (MAG-OX) 400 (241.3 Mg) MG tablet, TAKE 1 BY MOUTH ONCE A DAY, DO NOT TAKE WHILE TAKING ANY ANTIBIOTICS DR. Elizbeth Squires [phone], Disp: , Rfl: 2 .  Multiple Vitamin (MULTI-VITAMINS) TABS, Take by mouth., Disp: , Rfl:  .  pantoprazole (PROTONIX) 40 MG tablet, Take 40 mg by mouth daily., Disp: , Rfl:  .  potassium chloride  SA (K-DUR,KLOR-CON) 20 MEQ tablet, Take 20 mEq by mouth daily. , Disp: , Rfl:  .  pravastatin (PRAVACHOL) 20 MG tablet, Take 20 mg by mouth at bedtime. Reported on 12/13/2015, Disp: , Rfl:  .  traMADol (ULTRAM) 50 MG tablet, Take 50 mg by mouth every 6 (six) hours as needed., Disp: , Rfl:  .  Turmeric Curcumin 500 MG CAPS, Take by mouth daily. , Disp: , Rfl:  .  vitamin C (ASCORBIC ACID) 500 MG tablet, Take 500 mg by mouth daily., Disp: , Rfl:  .  HYDROcodone-acetaminophen (NORCO) 10-325 MG tablet, Take 1 tablet by mouth every 8 (eight) hours as needed for severe  pain., Disp: 90 tablet, Rfl: 0  ROS  Constitutional: Denies any fever or chills Gastrointestinal: No reported hemesis, hematochezia, vomiting, or acute GI distress Musculoskeletal: Denies any acute onset joint swelling, redness, loss of ROM, or weakness Neurological: No reported episodes of acute onset apraxia, aphasia, dysarthria, agnosia, amnesia, paralysis, loss of coordination, or loss of consciousness  Allergies  Ms. Gartner is allergic to contrast media [iodinated diagnostic agents] and shellfish allergy.  PFSH  Drug: Ms. Minella  reports current drug use. Alcohol:  reports no history of alcohol use. Tobacco:  reports that she has never smoked. She has never used smokeless tobacco. Medical:  has a past medical history of Anemia, Arthritis, COPD (chronic obstructive pulmonary disease) ([place]), Depression, Diabetes mellitus without complication ([place]), GERD (gastroesophageal reflux disease), Hiatal hernia, History of contrast media allergy. (IVP dye) (09/01/2015), History of hiatal hernia, Hypercholesteremia, Hypertension, Morbid obesity ([place]), Scoliosis, and Shortness of breath dyspnea. Surgical: Ms. Nicotra  has a past surgical history that includes Abdominal hysterectomy; Cataract extraction w/ intraocular lens implant (Right); Gastric bypass; Hernia repair; Cataract extraction w/PHACO (Left, 07/19/2015); Cholecystectomy; and Hernia  repair. Family: family history includes Diabetes in her mother; Heart disease in her father.  Constitutional Exam  General appearance: Well nourished, well developed, and well hydrated. In no apparent acute distress Vitals:   01/14/19 1343  BP: 130/61  Pulse: 67  Resp: 18  Temp: 98.8 F (37.1 C)  TempSrc: Oral  SpO2: 100%  Weight: 165 lb (74.8 kg)   BMI Assessment: Estimated body mass index is 32.22 kg/m as calculated from the following:   Height as of 12/25/18: 5' (1.524 m).   Weight as of this encounter: 165 lb (74.8 kg).  BMI interpretation table: BMI level Category Range association with higher incidence of chronic pain  <18 kg/m2 Underweight   18.5-24.9 kg/m2 Ideal body weight   25-29.9 kg/m2 Overweight Increased incidence by 20%  30-34.9 kg/m2 Obese (Class I) Increased incidence by 68%  35-39.9 kg/m2 Severe obesity (Class II) Increased incidence by 136%  >40 kg/m2 Extreme obesity (Class III) Increased incidence by 254%   Patient's current BMI Ideal Body weight  Body mass index is 32.22 kg/m. Ideal body weight: 45.5 kg (100 lb 4.9 oz) Adjusted ideal body weight: 57.2 kg (126 lb 3 oz)   BMI Readings from Last 4 Encounters:  01/14/19 32.22 kg/m  12/25/18 32.22 kg/m  11/27/18 32.61 kg/m  08/27/18 29.26 kg/m   Wt Readings from Last 4 Encounters:  01/14/19 165 lb (74.8 kg)  12/25/18 165 lb (74.8 kg)  11/27/18 167 lb (75.8 kg)  08/27/18 160 lb (72.6 kg)  Psych/Mental status: Alert, oriented x 3 (person, place, & time)       Eyes: PERLA Respiratory: No evidence of acute respiratory distress  Cervical Spine Area Exam  Skin & Axial Inspection: No masses, redness, edema, swelling, or associated skin lesions Alignment: Symmetrical Functional ROM: Unrestricted ROM      Stability: No instability detected Muscle Tone/Strength: Functionally intact. No obvious neuro-muscular anomalies detected. Sensory (Neurological): Unimpaired Palpation: No palpable anomalies               Upper Extremity (UE) Exam    Side: Right upper extremity  Side: Left upper extremity  Skin & Extremity Inspection: Skin color, temperature, and hair growth are WNL. No peripheral edema or cyanosis. No masses, redness, swelling, asymmetry, or associated skin lesions. No contractures.  Skin & Extremity Inspection: Skin color, temperature, and hair growth are WNL. No peripheral edema or  cyanosis. No masses, redness, swelling, asymmetry, or associated skin lesions. No contractures.  Functional ROM: Unrestricted ROM          Functional ROM: Unrestricted ROM          Muscle Tone/Strength: Functionally intact. No obvious neuro-muscular anomalies detected.  Muscle Tone/Strength: Functionally intact. No obvious neuro-muscular anomalies detected.  Sensory (Neurological): Unimpaired          Sensory (Neurological): Unimpaired          Palpation: No palpable anomalies              Palpation: No palpable anomalies              Provocative Test(s):  Phalen's test: deferred Tinel's test: deferred Apley's scratch test (touch opposite shoulder):  Action 1 (Across chest): deferred Action 2 (Overhead): deferred Action 3 (LB reach): deferred   Provocative Test(s):  Phalen's test: deferred Tinel's test: deferred Apley's scratch test (touch opposite shoulder):  Action 1 (Across chest): deferred Action 2 (Overhead): deferred Action 3 (LB reach): deferred    Thoracic Spine Area Exam  Skin & Axial Inspection: No masses, redness, or swelling Alignment: Symmetrical Functional ROM: Unrestricted ROM Stability: No instability detected Muscle Tone/Strength: Functionally intact. No obvious neuro-muscular anomalies detected. Sensory (Neurological): Unimpaired Muscle strength & Tone: No palpable anomalies  Lumbar Spine Area Exam  Skin & Axial Inspection: No masses, redness, or swelling Alignment: Symmetrical Functional ROM: Unrestricted ROM       Stability: No instability detected Muscle Tone/Strength:  Functionally intact. No obvious neuro-muscular anomalies detected. Sensory (Neurological): Unimpaired Palpation: No palpable anomalies       Provocative Tests: Hyperextension/rotation test: deferred today       Lumbar quadrant test (Kemp's test): deferred today       Lateral bending test: deferred today       Patrick's Maneuver: deferred today                   FABER* test: deferred today                   S-I anterior distraction/compression test: deferred today         S-I lateral compression test: deferred today         S-I Thigh-thrust test: deferred today         S-I Gaenslen's test: deferred today         *(Flexion, ABduction and External Rotation)  Gait & Posture Assessment  Ambulation: Patient ambulates using a cane Gait: Significantly limited. Dependent on assistive device to ambulate Posture: [place]   Lower Extremity Exam    Side: Right lower extremity  Side: Left lower extremity  Stability: No instability observed          Stability: No instability observed          Skin & Extremity Inspection: Skin color, temperature, and hair growth are WNL. No peripheral edema or cyanosis. No masses, redness, swelling, asymmetry, or associated skin lesions. No contractures.  Skin & Extremity Inspection: Skin color, temperature, and hair growth are WNL. No peripheral edema or cyanosis. No masses, redness, swelling, asymmetry, or associated skin lesions. No contractures.  Functional ROM: Decreased ROM for all joints of the lower extremity          Functional ROM: Decreased ROM for all joints of the lower extremity          Muscle Tone/Strength: Functionally intact. No obvious neuro-muscular anomalies detected.  Muscle Tone/Strength: Functionally intact. No obvious neuro-muscular  anomalies detected.  Sensory (Neurological): Unimpaired        Sensory (Neurological): Unimpaired        DTR: Patellar: deferred today Achilles: deferred today Plantar: deferred today  DTR: Patellar: deferred  today Achilles: deferred today Plantar: deferred today  Palpation: No palpable anomalies  Palpation: No palpable anomalies   Assessment   Status Diagnosis  Improving Improving Persistent 1. Chronic low back pain (Primary area of Pain) (Bilateral) (R>L)   2. Lumbar facet syndrome (Bilateral) (R>L)   3. Chronic hip pain (Right)   4. Cervicalgia   5. 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 Deformity of hand due to rheumatoid arthritis ([place])   7. Rheumatoid arthritis involving both hands with positive rheumatoid factor (HCC)      Updated Problems: Problem  Deformity of Foot Due to Rheumatoid Arthritis (Hcc)  Deformity of Hand Due to Rheumatoid Arthritis (Hcc)    Plan of Care  Pharmacotherapy (Medications Ordered): No orders of the defined types were placed in this encounter.  Medications administered today: [name] had no medications administered during this visit.   Procedure Orders     LUMBAR FACET(MEDIAL BRANCH NERVE BLOCK) [facility] Lab Orders  No laboratory test(s) ordered today   Imaging Orders  No imaging studies ordered today   Referral Orders  No referral(s) requested today   Interventional management options: Planned, scheduled, and/or pending:   Diagnostic bilateral lumbar facet block #2 under fluoroscopic guidance and IV sedation   Considering:   Palliative LESI  Palliative bilateral lumbar facet block  Palliative intra-articular hip joint injection    Palliative PRN treatment(s):   Palliative bilateral lumbar facet block  Palliative intra-articular hip joint injection    Provider-requested follow-up: Return for Procedure (w/ sedation): (B) L-FCT BLK #2.  Future Appointments  Date Time Provider [place]  02/24/2019 10:30 AM Vevelyn Francois, NP [facility] None   Primary Care Physician: Marguerita Merles, MD Location: [facility] Out[facility] Management Facility Note by: Gaspar Cola, MD Date: 01/14/2019; Time: 2:19 PM

## 2019-01-14 ENCOUNTER — Ambulatory Visit: Payer: Medicare Other | Attending: Pain Medicine | Admitting: Pain Medicine

## 2019-01-14 ENCOUNTER — Other Ambulatory Visit: Payer: Self-pay

## 2019-01-14 ENCOUNTER — Encounter: Payer: Self-pay | Admitting: Pain Medicine

## 2019-01-14 VITALS — BP 130/61 | HR 67 | Temp 98.8°F | Resp 18 | Wt 165.0 lb

## 2019-01-14 DIAGNOSIS — M21949 Unspecified acquired deformity of hand, unspecified hand: Secondary | ICD-10-CM | POA: Insufficient documentation

## 2019-01-14 DIAGNOSIS — M545 Low back pain, unspecified: Secondary | ICD-10-CM

## 2019-01-14 DIAGNOSIS — G8929 Other chronic pain: Secondary | ICD-10-CM | POA: Diagnosis present

## 2019-01-14 DIAGNOSIS — M05741 Rheumatoid arthritis with rheumatoid factor of right hand without organ or systems involvement: Secondary | ICD-10-CM | POA: Insufficient documentation

## 2019-01-14 DIAGNOSIS — M069 Rheumatoid arthritis, unspecified: Secondary | ICD-10-CM | POA: Diagnosis present

## 2019-01-14 DIAGNOSIS — M21941 Unspecified acquired deformity of hand, right hand: Secondary | ICD-10-CM | POA: Insufficient documentation

## 2019-01-14 DIAGNOSIS — M47816 Spondylosis without myelopathy or radiculopathy, lumbar region: Secondary | ICD-10-CM | POA: Diagnosis not present

## 2019-01-14 DIAGNOSIS — M25551 Pain in right hip: Secondary | ICD-10-CM | POA: Diagnosis present

## 2019-01-14 DIAGNOSIS — M542 Cervicalgia: Secondary | ICD-10-CM | POA: Diagnosis present

## 2019-01-14 DIAGNOSIS — M05742 Rheumatoid arthritis with rheumatoid factor of left hand without organ or systems involvement: Secondary | ICD-10-CM | POA: Insufficient documentation

## 2019-01-14 DIAGNOSIS — M21969 Unspecified acquired deformity of unspecified lower leg: Secondary | ICD-10-CM | POA: Insufficient documentation

## 2019-01-14 DIAGNOSIS — M21961 Unspecified acquired deformity of right lower leg: Secondary | ICD-10-CM | POA: Insufficient documentation

## 2019-01-14 NOTE — Patient Instructions (Signed)
____________________________________________________________________________________________  Preparing for Procedure with Sedation  Instructions: . Oral Intake: Do not eat or drink anything for at least 8 hours prior to your procedure. . Transportation: Public transportation is not allowed. Bring an adult driver. The driver must be physically present in our waiting room before any procedure can be started. . Physical Assistance: Bring an adult physically capable of assisting you, in the event you need help. This adult should keep you company at home for at least 6 hours after the procedure. . Blood Pressure Medicine: Take your blood pressure medicine with a sip of water the morning of the procedure. . Blood thinners: Notify our staff if you are taking any blood thinners. Depending on which one you take, there will be specific instructions on how and when to stop it. . Diabetics on insulin: Notify the staff so that you can be scheduled 1st case in the morning. If your diabetes requires high dose insulin, take only  of your normal insulin dose the morning of the procedure and notify the staff that you have done so. . Preventing infections: Shower with an antibacterial soap the morning of your procedure. . Build-up your immune system: Take 1000 mg of Vitamin C with every meal (3 times a day) the day prior to your procedure. . Antibiotics: Inform the staff if you have a condition or reason that requires you to take antibiotics before dental procedures. . Pregnancy: If you are pregnant, call and cancel the procedure. . Sickness: If you have a cold, fever, or any active infections, call and cancel the procedure. . Arrival: You must be in the facility at least 30 minutes prior to your scheduled procedure. . Children: Do not bring children with you. . Dress appropriately: Bring dark clothing that you would not mind if they get stained. . Valuables: Do not bring any jewelry or valuables.  Procedure  appointments are reserved for interventional treatments only. . No Prescription Refills. . No medication changes will be discussed during procedure appointments. . No disability issues will be discussed.  Reasons to call and reschedule or cancel your procedure: (Following these recommendations will minimize the risk of a serious complication.) . Surgeries: Avoid having procedures within 2 weeks of any surgery. (Avoid for 2 weeks before or after any surgery). . Flu Shots: Avoid having procedures within 2 weeks of a flu shots or . (Avoid for 2 weeks before or after immunizations). . Barium: Avoid having a procedure within 7-10 days after having had a radiological study involving the use of radiological contrast. (Myelograms, Barium swallow or enema study). . Heart attacks: Avoid any elective procedures or surgeries for the initial 6 months after a "Myocardial Infarction" (Heart Attack). . Blood thinners: It is imperative that you stop these medications before procedures. Let us know if you if you take any blood thinner.  . Infection: Avoid procedures during or within two weeks of an infection (including chest colds or gastrointestinal problems). Symptoms associated with infections include: Localized redness, fever, chills, night sweats or profuse sweating, burning sensation when voiding, cough, congestion, stuffiness, runny nose, sore throat, diarrhea, nausea, vomiting, cold or Flu symptoms, recent or current infections. It is specially important if the infection is over the area that we intend to treat. . Heart and lung problems: Symptoms that may suggest an active cardiopulmonary problem include: cough, chest pain, breathing difficulties or shortness of breath, dizziness, ankle swelling, uncontrolled high or unusually low blood pressure, and/or palpitations. If you are experiencing any of these symptoms, cancel   your procedure and contact your primary care physician for an evaluation.  Remember:   Regular Business hours are:  Monday to Thursday 8:00 AM to 4:00 PM  Provider's Schedule: Shivam Mestas, MD:  Procedure days: Tuesday and Thursday 7:30 AM to 4:00 PM  Bilal Lateef, MD:  Procedure days: Monday and Wednesday 7:30 AM to 4:00 PM ____________________________________________________________________________________________    

## 2019-01-14 NOTE — Progress Notes (Signed)
Safety precautions to be maintained throughout the outpatient stay will include: orient to surroundings, keep bed in low position, maintain call bell within reach at all times, provide assistance with transfer out of bed and ambulation.  

## 2019-01-15 DIAGNOSIS — M19042 Primary osteoarthritis, left hand: Secondary | ICD-10-CM | POA: Insufficient documentation

## 2019-01-15 DIAGNOSIS — M19041 Primary osteoarthritis, right hand: Secondary | ICD-10-CM | POA: Insufficient documentation

## 2019-01-15 DIAGNOSIS — M79641 Pain in right hand: Secondary | ICD-10-CM | POA: Insufficient documentation

## 2019-01-15 DIAGNOSIS — G8929 Other chronic pain: Secondary | ICD-10-CM | POA: Insufficient documentation

## 2019-01-22 ENCOUNTER — Ambulatory Visit: Payer: Medicare Other | Admitting: Pain Medicine

## 2019-01-22 NOTE — Progress Notes (Deleted)
Patient's Name: Mariah Poole  MRN: [id number]  Referring Provider: Leanna SatoMiles, Linda M, MD  DOB: 09-Jun-1946  PCP: Leanna SatoMiles, Linda M, MD  DOS: 01/22/2019  Note by: Oswaldo DoneFrancisco A Taylen Osorto, MD  Service setting: Ambulatory outpatient  Specialty: Interventional Pain Management  Patient type: Established  Location: [facility] (AMB) Pain Management Facility  Visit type: Interventional Procedure   Primary Reason for Visit: Interventional Pain Management Treatment. CC: No chief complaint on file.  Procedure:          Anesthesia, Analgesia, Anxiolysis:  Type: Lumbar Facet, Medial Branch Block(s) #2  Primary Purpose: Diagnostic Region: Posterolateral Lumbosacral Spine Level: L2, L3, L4, L5, & S1 Medial Branch Level(s). Injecting these levels blocks the L3-4, L4-5, and L5-S1 lumbar facet joints. Laterality: Bilateral  Type: Moderate (Conscious) Sedation combined with Local Anesthesia Indication(s): Analgesia and Anxiety Route: Intravenous (IV) IV Access: Secured Sedation: Meaningful verbal contact was maintained at all times during the procedure  Local Anesthetic: Lidocaine 1-2%  Position: Prone   Indications: 1. Spondylosis without myelopathy or radiculopathy, lumbosacral region   2. Lumbar facet syndrome (Bilateral) (R>L)   3. Lumbar facet hypertrophy   4. DDD (degenerative disc disease), lumbar   5. Lumbar spondylosis   6. Chronic low back pain (Primary area of Pain) (Bilateral) (R>L)    Pain Score: Pre-procedure:  /10 Post-procedure:  /10  Pre-op Assessment:  Mariah Poole is a 73 y.o. (year old), female patient, seen today for interventional treatment. She  has a past surgical history that includes Abdominal hysterectomy; Cataract extraction w/ intraocular lens implant (Right); Gastric bypass; Hernia repair; Cataract extraction w/PHACO (Left, 07/19/2015); Cholecystectomy; and Hernia repair. Mariah Poole has a current medication list which includes the following prescription(s): albuterol, atenolol, vitamin  d3, duloxetine, furosemide, hydrocodone-acetaminophen, hydrocodone-acetaminophen, hydrocodone-acetaminophen, magnesium oxide, multi-vitamins, pantoprazole, potassium chloride sa, pravastatin, tramadol, turmeric curcumin, and vitamin c. Her primarily concern today is the No chief complaint on file.  Initial Vital Signs:  Pulse/HCG Rate:    Temp:   Resp:   BP:   SpO2:    BMI: Estimated body mass index is 32.22 kg/m as calculated from the following:   Height as of 12/25/18: 5' (1.524 m).   Weight as of 01/14/19: 165 lb (74.8 kg).  Risk Assessment: Allergies: Reviewed. She is allergic to contrast media [iodinated diagnostic agents] and shellfish allergy.  Allergy Precautions: None required Coagulopathies: Reviewed. None identified.  Blood-thinner therapy: None at this time Active Infection(s): Reviewed. None identified. Mariah Poole is afebrile  Site Confirmation: Mariah Poole was asked to confirm the procedure and laterality before marking the site Procedure checklist: Completed Consent: Before the procedure and under the influence of no sedative(s), amnesic(s), or anxiolytics, the patient was informed of the treatment options, risks and possible complications. To fulfill our ethical and legal obligations, as recommended by the American Medical Association's Code of Ethics, I have informed the patient of my clinical impression; the nature and purpose of the treatment or procedure; the risks, benefits, and possible complications of the intervention; the alternatives, including doing nothing; the risk(s) and benefit(s) of the alternative treatment(s) or procedure(s); and the risk(s) and benefit(s) of doing nothing. The patient was provided information about the general risks and possible complications associated with the procedure. These may include, but are not limited to: failure to achieve desired goals, infection, bleeding, organ or nerve damage, allergic reactions, paralysis, and death. In  addition, the patient was informed of those risks and complications associated to Spine-related procedures, such as failure to decrease pain;  infection (i.e.: Meningitis, epidural or intraspinal abscess); bleeding (i.e.: epidural hematoma, subarachnoid hemorrhage, or any other type of intraspinal or peri-dural bleeding); organ or nerve damage (i.e.: Any type of peripheral nerve, nerve root, or spinal cord injury) with subsequent damage to sensory, motor, and/or autonomic systems, resulting in permanent pain, numbness, and/or weakness of one or several areas of the body; allergic reactions; (i.e.: anaphylactic reaction); and/or death. Furthermore, the patient was informed of those risks and complications associated with the medications. These include, but are not limited to: allergic reactions (i.e.: anaphylactic or anaphylactoid reaction(s)); adrenal axis suppression; blood sugar elevation that in diabetics may result in ketoacidosis or comma; water retention that in patients with history of congestive heart failure may result in shortness of breath, pulmonary edema, and decompensation with resultant heart failure; weight gain; swelling or edema; medication-induced neural toxicity; particulate matter embolism and blood vessel occlusion with resultant organ, and/or nervous system infarction; and/or aseptic necrosis of one or more joints. Finally, the patient was informed that Medicine is not an exact science; therefore, there is also the possibility of unforeseen or unpredictable risks and/or possible complications that may result in a catastrophic outcome. The patient indicated having understood very clearly. We have given the patient no guarantees and we have made no promises. Enough time was given to the patient to ask questions, all of which were answered to the patient's satisfaction. Mariah Poole has indicated that she wanted to continue with the procedure. Attestation: I, the ordering provider, attest that I  have discussed with the patient the benefits, risks, side-effects, alternatives, likelihood of achieving goals, and potential problems during recovery for the procedure that I have provided informed consent. Date  Time: {CHL [facility]-PAIN TIME CHOICES:[id number]}  Pre-Procedure Preparation:  Monitoring: As per clinic protocol. Respiration, ETCO2, SpO2, BP, heart rate and rhythm monitor placed and checked for adequate function Safety Precautions: Patient was assessed for positional comfort and pressure points before starting the procedure. Time-out: I initiated and conducted the "Time-out" before starting the procedure, as per protocol. The patient was asked to participate by confirming the accuracy of the "Time Out" information. Verification of the correct person, site, and procedure were performed and confirmed by me, the nursing staff, and the patient. "Time-out" conducted as per Joint Commission's Universal Protocol (UP.01.01.01). Time:    Description of Procedure:          Laterality: Bilateral. The procedure was performed in identical fashion on both sides. Levels:  L2, L3, L4, L5, & S1 Medial Branch Level(s) Area Prepped: Posterior Lumbosacral Region Prepping solution: ChloraPrep (2% chlorhexidine gluconate and 70% isopropyl alcohol) Safety Precautions: Aspiration looking for blood return was conducted prior to all injections. At no point did we inject any substances, as a needle was being advanced. Before injecting, the patient was told to immediately notify me if she was experiencing any new onset of "ringing in the ears, or metallic taste in the mouth". No attempts were made at seeking any paresthesias. Safe injection practices and needle disposal techniques used. Medications properly checked for expiration dates. SDV (single dose vial) medications used. After the completion of the procedure, all disposable equipment used was discarded in the proper designated medical waste containers. Local  Anesthesia: Protocol guidelines were followed. The patient was positioned over the fluoroscopy table. The area was prepped in the usual manner. The time-out was completed. The target area was identified using fluoroscopy. A 12-in long, straight, sterile hemostat was used with fluoroscopic guidance to locate the targets for each level  blocked. Once located, the skin was marked with an approved surgical skin marker. Once all sites were marked, the skin (epidermis, dermis, and hypodermis), as well as deeper tissues (fat, connective tissue and muscle) were infiltrated with a small amount of a short-acting local anesthetic, loaded on a 10cc syringe with a 25G, 1.5-in  Needle. An appropriate amount of time was allowed for local anesthetics to take effect before proceeding to the next step. Local Anesthetic: Lidocaine 2.0% The unused portion of the local anesthetic was discarded in the proper designated containers. Technical explanation of process:  L2 Medial Branch Nerve Block (MBB): The target area for the L2 medial branch is at the junction of the postero-lateral aspect of the superior articular process and the superior, posterior, and medial edge of the transverse process of L3. Under fluoroscopic guidance, a Quincke needle was inserted until contact was made with os over the superior postero-lateral aspect of the pedicular shadow (target area). After negative aspiration for blood, 0.5 mL of the nerve block solution was injected without difficulty or complication. The needle was removed intact. L3 Medial Branch Nerve Block (MBB): The target area for the L3 medial branch is at the junction of the postero-lateral aspect of the superior articular process and the superior, posterior, and medial edge of the transverse process of L4. Under fluoroscopic guidance, a Quincke needle was inserted until contact was made with os over the superior postero-lateral aspect of the pedicular shadow (target area). After negative  aspiration for blood, 0.5 mL of the nerve block solution was injected without difficulty or complication. The needle was removed intact. L4 Medial Branch Nerve Block (MBB): The target area for the L4 medial branch is at the junction of the postero-lateral aspect of the superior articular process and the superior, posterior, and medial edge of the transverse process of L5. Under fluoroscopic guidance, a Quincke needle was inserted until contact was made with os over the superior postero-lateral aspect of the pedicular shadow (target area). After negative aspiration for blood, 0.5 mL of the nerve block solution was injected without difficulty or complication. The needle was removed intact. L5 Medial Branch Nerve Block (MBB): The target area for the L5 medial branch is at the junction of the postero-lateral aspect of the superior articular process and the superior, posterior, and medial edge of the sacral ala. Under fluoroscopic guidance, a Quincke needle was inserted until contact was made with os over the superior postero-lateral aspect of the pedicular shadow (target area). After negative aspiration for blood, 0.5 mL of the nerve block solution was injected without difficulty or complication. The needle was removed intact. S1 Medial Branch Nerve Block (MBB): The target area for the S1 medial branch is at the posterior and inferior 6 o'clock position of the L5-S1 facet joint. Under fluoroscopic guidance, the Quincke needle inserted for the L5 MBB was redirected until contact was made with os over the inferior and postero aspect of the sacrum, at the 6 o' clock position under the L5-S1 facet joint (Target area). After negative aspiration for blood, 0.5 mL of the nerve block solution was injected without difficulty or complication. The needle was removed intact.  Nerve block solution: 0.2% PF-Ropivacaine + Triamcinolone (40 mg/mL) diluted to a final concentration of 4 mg of Triamcinolone/mL of Ropivacaine The  unused portion of the solution was discarded in the proper designated containers. Procedural Needles: 22-gauge, 3.5-inch, Quincke needles used for all levels.  Once the entire procedure was completed, the treated area was cleaned, making  sure to leave some of the prepping solution back to take advantage of its long term bactericidal properties.   Illustration of the posterior view of the lumbar spine and the posterior neural structures. Laminae of L2 through S1 are labeled. DPRL5, dorsal primary ramus of L5; DPRS1, dorsal primary ramus of S1; DPR3, dorsal primary ramus of L3; FJ, facet (zygapophyseal) joint L3-L4; I, inferior articular process of L4; LB1, lateral branch of dorsal primary ramus of L1; IAB, inferior articular branches from L3 medial branch (supplies L4-L5 facet joint); IBP, intermediate branch plexus; MB3, medial branch of dorsal primary ramus of L3; NR3, third lumbar nerve root; S, superior articular process of L5; SAB, superior articular branches from L4 (supplies L4-5 facet joint also); TP3, transverse process of L3.  There were no vitals filed for this visit.   Start Time:   hrs. End Time:   hrs.  Imaging Guidance (Spinal):          Type of Imaging Technique: Fluoroscopy Guidance (Spinal) Indication(s): Assistance in needle guidance and placement for procedures requiring needle placement in or near specific anatomical locations not easily accessible without such assistance. Exposure Time: Please see nurses notes. Contrast: None used. Fluoroscopic Guidance: I was personally present during the use of fluoroscopy. "Tunnel Vision Technique" used to obtain the best possible view of the target area. Parallax error corrected before commencing the procedure. "Direction-depth-direction" technique used to introduce the needle under continuous pulsed fluoroscopy. Once target was reached, antero-posterior, oblique, and lateral fluoroscopic projection used confirm needle placement in all  planes. Images permanently stored in EMR. Interpretation: No contrast injected. I personally interpreted the imaging intraoperatively. Adequate needle placement confirmed in multiple planes. Permanent images saved into the patient's record.  Antibiotic Prophylaxis:   Anti-infectives (From admission, onward)   None     Indication(s): None identified  Post-operative Assessment:  Post-procedure Vital Signs:  Pulse/HCG Rate:    Temp:   Resp:   BP:   SpO2:    EBL: None  Complications: No immediate post-treatment complications observed by team, or reported by patient.  Note: The patient tolerated the entire procedure well. A repeat set of vitals were taken after the procedure and the patient was kept under observation following institutional policy, for this type of procedure. Post-procedural neurological assessment was performed, showing return to baseline, prior to discharge. The patient was provided with post-procedure discharge instructions, including a section on how to identify potential problems. Should any problems arise concerning this procedure, the patient was given instructions to immediately contact us, at any time, without hesitation. In any case, we plan to contact the patient by telephone for a follow-up status report regarding this interventional procedure.  Comments:  No additional relevant information.  Plan of Care   Imaging Orders  No imaging studies ordered today   Orders:  No orders of the defined types were placed in this encounter.   Medications ordered for procedure: No orders of the defined types were placed in this encounter.  Medications administered: Mariah Feathers had no medications administered during this visit.  See the medical record for exact dosing, route, and time of administration.  Disposition: Discharge home  Discharge Date & Time: 01/22/2019;   hrs.   Follow-up plan:   No follow-ups on file.Marland Kitchen   Future Appointments  Date Time  Provider Department Center  01/22/2019  8:30 AM Delano Metz, MD [facility] None  02/24/2019 10:30 AM Barbette Merino, NP [facility] None   Primary Care Physician: Darreld Mclean  Judie Petit, MD Location: [facility] Note by: Oswaldo Done, MD Date: 01/22/2019; Time: 5:41 AM  Disclaimer:  Medicine is not an Visual merchandiser. The only guarantee in medicine is that nothing is guaranteed. It is important to note that the decision to proceed with this intervention was based on the information collected from the patient. The Data and conclusions were drawn from the patient's questionnaire, the interview, and the physical examination. Because the information was provided in large part by the patient, it cannot be guaranteed that it has not been purposely or unconsciously manipulated. Every effort has been made to obtain as much relevant data as possible for this evaluation. It is important to note that the conclusions that lead to this procedure are derived in large part from the available data. Always take into account that the treatment will also be dependent on availability of resources and existing treatment guidelines, considered by other Pain Management Practitioners as being common knowledge and practice, at the time of the intervention. For Medico-Legal purposes, it is also important to point out that variation in procedural techniques and pharmacological choices are the acceptable norm. The indications, contraindications, technique, and results of the above procedure should only be interpreted and judged by a Board-Certified Interventional Pain Specialist with extensive familiarity and expertise in the same exact procedure and technique.

## 2019-01-27 ENCOUNTER — Other Ambulatory Visit: Payer: Self-pay

## 2019-01-27 ENCOUNTER — Ambulatory Visit
Admission: RE | Admit: 2019-01-27 | Discharge: 2019-01-27 | Disposition: A | Discharge status: Home / Self Care | Payer: Medicare Other | Source: Ambulatory Visit | Attending: Pain Medicine | Admitting: Pain Medicine

## 2019-01-27 ENCOUNTER — Ambulatory Visit (HOSPITAL_BASED_OUTPATIENT_CLINIC_OR_DEPARTMENT_OTHER): Payer: Medicare Other | Admitting: Pain Medicine

## 2019-01-27 ENCOUNTER — Encounter: Payer: Self-pay | Admitting: Pain Medicine

## 2019-01-27 VITALS — BP 114/72 | HR 66 | Temp 98.3°F | Resp 21 | Ht 60.0 in | Wt 165.0 lb

## 2019-01-27 DIAGNOSIS — M47816 Spondylosis without myelopathy or radiculopathy, lumbar region: Secondary | ICD-10-CM

## 2019-01-27 DIAGNOSIS — M545 Low back pain, unspecified: Secondary | ICD-10-CM

## 2019-01-27 DIAGNOSIS — Z91041 Radiographic dye allergy status: Secondary | ICD-10-CM | POA: Insufficient documentation

## 2019-01-27 DIAGNOSIS — M5136 Other intervertebral disc degeneration, lumbar region: Secondary | ICD-10-CM | POA: Diagnosis present

## 2019-01-27 DIAGNOSIS — G8929 Other chronic pain: Secondary | ICD-10-CM | POA: Diagnosis present

## 2019-01-27 DIAGNOSIS — M47817 Spondylosis without myelopathy or radiculopathy, lumbosacral region: Secondary | ICD-10-CM

## 2019-01-27 MED ORDER — TRIAMCINOLONE ACETONIDE 40 MG/ML IJ SUSP
INTRAMUSCULAR | Status: AC
  Filled 2019-01-27: qty 1

## 2019-01-27 MED ORDER — LIDOCAINE HCL 2 % IJ SOLN
20.0000 mL | Freq: Once | INTRAMUSCULAR | Status: AC
  Administered 2019-01-27: 400 mg
  Filled 2019-01-27: qty 40

## 2019-01-27 MED ORDER — MIDAZOLAM HCL 5 MG/5ML IJ SOLN
1.0000 mg | INTRAMUSCULAR | Status: DC | PRN
  Administered 2019-01-27: 2 mg via INTRAVENOUS
  Filled 2019-01-27: qty 5

## 2019-01-27 MED ORDER — FENTANYL CITRATE (PF) 100 MCG/2ML IJ SOLN
25.0000 ug | INTRAMUSCULAR | Status: DC | PRN
  Administered 2019-01-27: 50 ug via INTRAVENOUS
  Filled 2019-01-27: qty 2

## 2019-01-27 MED ORDER — ROPIVACAINE HCL 2 MG/ML IJ SOLN
INTRAMUSCULAR | Status: AC
  Filled 2019-01-27: qty 10

## 2019-01-27 MED ORDER — LACTATED RINGERS IV SOLN
1000.0000 mL | Freq: Once | INTRAVENOUS | Status: AC
  Administered 2019-01-27: 1000 mL via INTRAVENOUS

## 2019-01-27 MED ORDER — ROPIVACAINE HCL 2 MG/ML IJ SOLN
18.0000 mL | Freq: Once | INTRAMUSCULAR | Status: AC
  Administered 2019-01-27: 18 mL via PERINEURAL
  Filled 2019-01-27: qty 20

## 2019-01-27 MED ORDER — TRIAMCINOLONE ACETONIDE 40 MG/ML IJ SUSP
80.0000 mg | Freq: Once | INTRAMUSCULAR | Status: AC
  Administered 2019-01-27: 40 mg
  Filled 2019-01-27: qty 2

## 2019-01-27 NOTE — Progress Notes (Signed)
Patient's Name: Mariah Poole  MRN: [id number]  Referring Provider: Leanna Sato, MD  DOB: 08/30/1946  PCP: Leanna Sato, MD  DOS: 01/27/2019  Note by: Oswaldo Done, MD  Service setting: Ambulatory outpatient  Specialty: Interventional Pain Management  Patient type: Established  Location: [facility] (AMB) Pain Management Facility  Visit type: Interventional Procedure   Primary Reason for Visit: Interventional Pain Management Treatment. CC: Back Pain  Procedure:          Anesthesia, Analgesia, Anxiolysis:  Type: Lumbar Facet, Medial Branch Block(s) #2  Primary Purpose: Diagnostic Region: Posterolateral Lumbosacral Spine Level: L2, L3, L4, L5, & S1 Medial Branch Level(s). Injecting these levels blocks the L3-4, L4-5, and L5-S1 lumbar facet joints. Laterality: Bilateral  Type: Moderate (Conscious) Sedation combined with Local Anesthesia Indication(s): Analgesia and Anxiety Route: Intravenous (IV) IV Access: Secured Sedation: Meaningful verbal contact was maintained at all times during the procedure  Local Anesthetic: Lidocaine 1-2%  Position: Prone   Indications: 1. Spondylosis without myelopathy or radiculopathy, lumbosacral region   2. Lumbar facet syndrome (Bilateral) (R>L)   3. Lumbar facet hypertrophy   4. DDD (degenerative disc disease), lumbar   5. Chronic low back pain (Primary area of Pain) (Bilateral) (R>L)    Pain Score: Pre-procedure: 6 /10 Post-procedure: 0-No pain/10  Pre-op Assessment:  Mariah Poole is a 73 y.o. (year old), female patient, seen today for interventional treatment. She  has a past surgical history that includes Abdominal hysterectomy; Cataract extraction w/ intraocular lens implant (Right); Gastric bypass; Hernia repair; Cataract extraction w/PHACO (Left, 07/19/2015); Cholecystectomy; and Hernia repair. Mariah Poole has a current medication list which includes the following prescription(s): albuterol, atenolol, vitamin d3, duloxetine, furosemide,  hydrocodone-acetaminophen, hydrocodone-acetaminophen, magnesium oxide, multi-vitamins, pantoprazole, potassium chloride sa, pravastatin, tramadol, turmeric curcumin, vitamin c, and hydrocodone-acetaminophen, and the following Facility-Administered Medications: fentanyl and midazolam. Her primarily concern today is the Back Pain  Initial Vital Signs:  Pulse/HCG Rate: 66ECG Heart Rate: 68 Temp: 97.7 F (36.5 C) Resp: 15 BP: (!) 124/97 SpO2: 100 %  BMI: Estimated body mass index is 32.22 kg/m as calculated from the following:   Height as of this encounter: 5' (1.524 m).   Weight as of this encounter: 165 lb (74.8 kg).  Risk Assessment: Allergies: Reviewed. She is allergic to contrast media [iodinated diagnostic agents] and shellfish allergy.  Allergy Precautions: None required Coagulopathies: Reviewed. None identified.  Blood-thinner therapy: None at this time Active Infection(s): Reviewed. None identified. Mariah Poole is afebrile  Site Confirmation: Mariah Poole was asked to confirm the procedure and laterality before marking the site Procedure checklist: Completed Consent: Before the procedure and under the influence of no sedative(s), amnesic(s), or anxiolytics, the patient was informed of the treatment options, risks and possible complications. To fulfill our ethical and legal obligations, as recommended by the American Medical Association's Code of Ethics, I have informed the patient of my clinical impression; the nature and purpose of the treatment or procedure; the risks, benefits, and possible complications of the intervention; the alternatives, including doing nothing; the risk(s) and benefit(s) of the alternative treatment(s) or procedure(s); and the risk(s) and benefit(s) of doing nothing. The patient was provided information about the general risks and possible complications associated with the procedure. These may include, but are not limited to: failure to achieve desired goals,  infection, bleeding, organ or nerve damage, allergic reactions, paralysis, and death. In addition, the patient was informed of those risks and complications associated to Spine-related procedures, such as failure to decrease  pain; infection (i.e.: Meningitis, epidural or intraspinal abscess); bleeding (i.e.: epidural hematoma, subarachnoid hemorrhage, or any other type of intraspinal or peri-dural bleeding); organ or nerve damage (i.e.: Any type of peripheral nerve, nerve root, or spinal cord injury) with subsequent damage to sensory, motor, and/or autonomic systems, resulting in permanent pain, numbness, and/or weakness of one or several areas of the body; allergic reactions; (i.e.: anaphylactic reaction); and/or death. Furthermore, the patient was informed of those risks and complications associated with the medications. These include, but are not limited to: allergic reactions (i.e.: anaphylactic or anaphylactoid reaction(s)); adrenal axis suppression; blood sugar elevation that in diabetics may result in ketoacidosis or comma; water retention that in patients with history of congestive heart failure may result in shortness of breath, pulmonary edema, and decompensation with resultant heart failure; weight gain; swelling or edema; medication-induced neural toxicity; particulate matter embolism and blood vessel occlusion with resultant organ, and/or nervous system infarction; and/or aseptic necrosis of one or more joints. Finally, the patient was informed that Medicine is not an exact science; therefore, there is also the possibility of unforeseen or unpredictable risks and/or possible complications that may result in a catastrophic outcome. The patient indicated having understood very clearly. We have given the patient no guarantees and we have made no promises. Enough time was given to the patient to ask questions, all of which were answered to the patient's satisfaction. Mariah Poole has indicated that she  wanted to continue with the procedure. Attestation: I, the ordering provider, attest that I have discussed with the patient the benefits, risks, side-effects, alternatives, likelihood of achieving goals, and potential problems during recovery for the procedure that I have provided informed consent. Date  Time: 01/27/2019 12:55 PM  Pre-Procedure Preparation:  Monitoring: As per clinic protocol. Respiration, ETCO2, SpO2, BP, heart rate and rhythm monitor placed and checked for adequate function Safety Precautions: Patient was assessed for positional comfort and pressure points before starting the procedure. Time-out: I initiated and conducted the "Time-out" before starting the procedure, as per protocol. The patient was asked to participate by confirming the accuracy of the "Time Out" information. Verification of the correct person, site, and procedure were performed and confirmed by me, the nursing staff, and the patient. "Time-out" conducted as per Joint Commission's Universal Protocol (UP.01.01.01). Time: 1416  Description of Procedure:          Laterality: Bilateral. The procedure was performed in identical fashion on both sides. Levels:  L2, L3, L4, L5, & S1 Medial Branch Level(s) Area Prepped: Posterior Lumbosacral Region Prepping solution: ChloraPrep (2% chlorhexidine gluconate and 70% isopropyl alcohol) Safety Precautions: Aspiration looking for blood return was conducted prior to all injections. At no point did we inject any substances, as a needle was being advanced. Before injecting, the patient was told to immediately notify me if she was experiencing any new onset of "ringing in the ears, or metallic taste in the mouth". No attempts were made at seeking any paresthesias. Safe injection practices and needle disposal techniques used. Medications properly checked for expiration dates. SDV (single dose vial) medications used. After the completion of the procedure, all disposable equipment used  was discarded in the proper designated medical waste containers. Local Anesthesia: Protocol guidelines were followed. The patient was positioned over the fluoroscopy table. The area was prepped in the usual manner. The time-out was completed. The target area was identified using fluoroscopy. A 12-in long, straight, sterile hemostat was used with fluoroscopic guidance to locate the targets for each level blocked.  Once located, the skin was marked with an approved surgical skin marker. Once all sites were marked, the skin (epidermis, dermis, and hypodermis), as well as deeper tissues (fat, connective tissue and muscle) were infiltrated with a small amount of a short-acting local anesthetic, loaded on a 10cc syringe with a 25G, 1.5-in  Needle. An appropriate amount of time was allowed for local anesthetics to take effect before proceeding to the next step. Local Anesthetic: Lidocaine 2.0% The unused portion of the local anesthetic was discarded in the proper designated containers. Technical explanation of process:  L2 Medial Branch Nerve Block (MBB): The target area for the L2 medial branch is at the junction of the postero-lateral aspect of the superior articular process and the superior, posterior, and medial edge of the transverse process of L3. Under fluoroscopic guidance, a Quincke needle was inserted until contact was made with os over the superior postero-lateral aspect of the pedicular shadow (target area). After negative aspiration for blood, 0.5 mL of the nerve block solution was injected without difficulty or complication. The needle was removed intact. L3 Medial Branch Nerve Block (MBB): The target area for the L3 medial branch is at the junction of the postero-lateral aspect of the superior articular process and the superior, posterior, and medial edge of the transverse process of L4. Under fluoroscopic guidance, a Quincke needle was inserted until contact was made with os over the superior  postero-lateral aspect of the pedicular shadow (target area). After negative aspiration for blood, 0.5 mL of the nerve block solution was injected without difficulty or complication. The needle was removed intact. L4 Medial Branch Nerve Block (MBB): The target area for the L4 medial branch is at the junction of the postero-lateral aspect of the superior articular process and the superior, posterior, and medial edge of the transverse process of L5. Under fluoroscopic guidance, a Quincke needle was inserted until contact was made with os over the superior postero-lateral aspect of the pedicular shadow (target area). After negative aspiration for blood, 0.5 mL of the nerve block solution was injected without difficulty or complication. The needle was removed intact. L5 Medial Branch Nerve Block (MBB): The target area for the L5 medial branch is at the junction of the postero-lateral aspect of the superior articular process and the superior, posterior, and medial edge of the sacral ala. Under fluoroscopic guidance, a Quincke needle was inserted until contact was made with os over the superior postero-lateral aspect of the pedicular shadow (target area). After negative aspiration for blood, 0.5 mL of the nerve block solution was injected without difficulty or complication. The needle was removed intact. S1 Medial Branch Nerve Block (MBB): The target area for the S1 medial branch is at the posterior and inferior 6 o'clock position of the L5-S1 facet joint. Under fluoroscopic guidance, the Quincke needle inserted for the L5 MBB was redirected until contact was made with os over the inferior and postero aspect of the sacrum, at the 6 o' clock position under the L5-S1 facet joint (Target area). After negative aspiration for blood, 0.5 mL of the nerve block solution was injected without difficulty or complication. The needle was removed intact.  Nerve block solution: 0.2% PF-Ropivacaine + Triamcinolone (40 mg/mL) diluted  to a final concentration of 4 mg of Triamcinolone/mL of Ropivacaine The unused portion of the solution was discarded in the proper designated containers. Procedural Needles: 22-gauge, 7-inch, Quincke needles used for all levels.  Once the entire procedure was completed, the treated area was cleaned, making sure  to leave some of the prepping solution back to take advantage of its long term bactericidal properties.   Illustration of the posterior view of the lumbar spine and the posterior neural structures. Laminae of L2 through S1 are labeled. DPRL5, dorsal primary ramus of L5; DPRS1, dorsal primary ramus of S1; DPR3, dorsal primary ramus of L3; FJ, facet (zygapophyseal) joint L3-L4; I, inferior articular process of L4; LB1, lateral branch of dorsal primary ramus of L1; IAB, inferior articular branches from L3 medial branch (supplies L4-L5 facet joint); IBP, intermediate branch plexus; MB3, medial branch of dorsal primary ramus of L3; NR3, third lumbar nerve root; S, superior articular process of L5; SAB, superior articular branches from L4 (supplies L4-5 facet joint also); TP3, transverse process of L3.  Vitals:   01/27/19 1437 01/27/19 1447 01/27/19 1457 01/27/19 1507  BP: 117/73 118/80 112/70 114/72  Pulse:      Resp: (!) 21  Temp:  98.3 F (36.8 C)  98.3 F (36.8 C)  SpO2: 97% 97% 100% 100%  Weight:      Height:         Start Time: 1416 hrs. End Time: 1436 hrs.  Imaging Guidance (Spinal):          Type of Imaging Technique: Fluoroscopy Guidance (Spinal) Indication(s): Assistance in needle guidance and placement for procedures requiring needle placement in or near specific anatomical locations not easily accessible without such assistance. Exposure Time: Please see nurses notes. Contrast: None used. Fluoroscopic Guidance: I was personally present during the use of fluoroscopy. "Tunnel Vision Technique" used to obtain the best possible view of the target area. Parallax error  corrected before commencing the procedure. "Direction-depth-direction" technique used to introduce the needle under continuous pulsed fluoroscopy. Once target was reached, antero-posterior, oblique, and lateral fluoroscopic projection used confirm needle placement in all planes. Images permanently stored in EMR. Interpretation: No contrast injected. I personally interpreted the imaging intraoperatively. Adequate needle placement confirmed in multiple planes. Permanent images saved into the patient's record.  Antibiotic Prophylaxis:   Anti-infectives (From admission, onward)   None     Indication(s): None identified  Post-operative Assessment:  Post-procedure Vital Signs:  Pulse/HCG Rate: 6660 Temp: 98.3 F (36.8 C) Resp: (!) 21 BP: 114/72 SpO2: 100 %  EBL: None  Complications: No immediate post-treatment complications observed by team, or reported by patient.  Note: The patient tolerated the entire procedure well. A repeat set of vitals were taken after the procedure and the patient was kept under observation following institutional policy, for this type of procedure. Post-procedural neurological assessment was performed, showing return to baseline, prior to discharge. The patient was provided with post-procedure discharge instructions, including a section on how to identify potential problems. Should any problems arise concerning this procedure, the patient was given instructions to immediately contact us, at any time, without hesitation. In any case, we plan to contact the patient by telephone for a follow-up status report regarding this interventional procedure.  Comments:  No additional relevant information.  Plan of Care  Orders:  Orders Placed This Encounter  Procedures  . LUMBAR FACET(MEDIAL BRANCH NERVE BLOCK) MBNB    Scheduling Instructions:     Side: Bilateral     Level: L3-4, L4-5, & L5-S1 Facets (L2, L3, L4, L5, & S1 Medial Branch Nerves)     Sedation: With Sedation.       Timeframe: Today    Order Specific Question:   Where will this procedure be performed?    Answer:  [facility]  . DG C-Arm 1-60 Min-No Report    Intraoperative interpretation by procedural physician at [facility].    Standing Status:   Standing    Number of Occurrences:   1    Order Specific Question:   Reason for exam:    Answer:   Assistance in needle guidance and placement for procedures requiring needle placement in or near specific anatomical locations not easily accessible without such assistance.  . Provider attestation of informed consent for procedure/surgical case    I, the ordering provider, attest that I have discussed with the patient the benefits, risks, side effects, alternatives, likelihood of achieving goals and potential problems during recovery for the procedure that I have provided informed consent.    Standing Status:   Standing    Number of Occurrences:   1  . Informed Consent Details: Transcribe to consent form and obtain patient signature    Consent Attestation: I, the ordering provider, attest that I have discussed with the patient the benefits, risks, side-effects, alternatives, likelihood of achieving goals, and potential problems during recovery for the procedure that I have provided informed consent.    Standing Status:   Standing    Number of Occurrences:   1    Order Specific Question:   Procedure    Answer:   Diagnostic, Bilateral, Lumbar facet block under fluoroscopic guidance. (See notes for level(s).)    Order Specific Question:   Surgeon    Answer:   Sydnee Levans. Laban Emperor, MD    Order Specific Question:   Indication/Reason    Answer:   Chronic low back pain secondary to lumbar facet syndrome and primary osteoarthritis of the lumbar spine  . Miscellanous precautions    Standing Status:   Standing    Number of Occurrences:   1   Medications ordered for procedure: Meds ordered this encounter  Medications  . lidocaine  (XYLOCAINE) 2 % (with pres) injection 400 mg  . midazolam (VERSED) 5 MG/5ML injection 1-2 mg    Make sure Flumazenil is available in the pyxis when using this medication. If oversedation occurs, administer 0.2 mg IV over 15 sec. If after 45 sec no response, administer 0.2 mg again over 1 min; may repeat at 1 min intervals; not to exceed 4 doses (1 mg)  . fentaNYL (SUBLIMAZE) injection 25-50 mcg    Make sure Narcan is available in the pyxis when using this medication. In the event of respiratory depression (RR< 8/min): Titrate NARCAN (naloxone) in increments of 0.1 to 0.2 mg IV at 2-3 minute intervals, until desired degree of reversal.  . lactated ringers infusion 1,000 mL  . ropivacaine (PF) 2 mg/mL (0.2%) (NAROPIN) injection 18 mL  . triamcinolone acetonide (KENALOG-40) injection 80 mg   Medications administered: We administered lidocaine, midazolam, fentaNYL, lactated ringers, ropivacaine (PF) 2 mg/mL (0.2%), and triamcinolone acetonide.  See the medical record for exact dosing, route, and time of administration.  Disposition: Discharge home  Discharge Date & Time: 01/27/2019; 1508 hrs.   Follow-up plan:   Return for PPE (2 wks) w/ NP.     Future Appointments  Date Time Provider Department Center  02/10/2019 10:30 AM Barbette Merino, NP [facility] None  02/24/2019 10:30 AM Barbette Merino, NP [facility] None   Primary Care Physician: Leanna Sato, MD Location: [facility] Note by: Oswaldo Done, MD Date: 01/27/2019; Time: 3:37 PM  Disclaimer:  Medicine is not an Visual merchandiser. The only guarantee  in medicine is that nothing is guaranteed. It is important to note that the decision to proceed with this intervention was based on the information collected from the patient. The Data and conclusions were drawn from the patient's questionnaire, the interview, and the physical examination. Because the information was provided in large part by the patient, it  cannot be guaranteed that it has not been purposely or unconsciously manipulated. Every effort has been made to obtain as much relevant data as possible for this evaluation. It is important to note that the conclusions that lead to this procedure are derived in large part from the available data. Always take into account that the treatment will also be dependent on availability of resources and existing treatment guidelines, considered by other Pain Management Practitioners as being common knowledge and practice, at the time of the intervention. For Medico-Legal purposes, it is also important to point out that variation in procedural techniques and pharmacological choices are the acceptable norm. The indications, contraindications, technique, and results of the above procedure should only be interpreted and judged by a Board-Certified Interventional Pain Specialist with extensive familiarity and expertise in the same exact procedure and technique.

## 2019-01-27 NOTE — Patient Instructions (Signed)

## 2019-01-28 ENCOUNTER — Telehealth: Payer: Self-pay

## 2019-01-28 NOTE — Telephone Encounter (Signed)
Post procedure phone call.  Patient states she is doing well.  

## 2019-02-10 ENCOUNTER — Ambulatory Visit: Payer: Medicare Other | Admitting: Nurse Practitioner

## 2019-02-24 ENCOUNTER — Ambulatory Visit: Payer: Medicare Other | Attending: Nurse Practitioner | Admitting: Nurse Practitioner

## 2019-02-24 ENCOUNTER — Other Ambulatory Visit: Payer: Self-pay

## 2019-02-24 DIAGNOSIS — M05741 Rheumatoid arthritis with rheumatoid factor of right hand without organ or systems involvement: Secondary | ICD-10-CM

## 2019-02-24 DIAGNOSIS — M5136 Other intervertebral disc degeneration, lumbar region: Secondary | ICD-10-CM

## 2019-02-24 DIAGNOSIS — G894 Chronic pain syndrome: Secondary | ICD-10-CM

## 2019-02-24 DIAGNOSIS — M05742 Rheumatoid arthritis with rheumatoid factor of left hand without organ or systems involvement: Secondary | ICD-10-CM

## 2019-02-24 DIAGNOSIS — M47817 Spondylosis without myelopathy or radiculopathy, lumbosacral region: Secondary | ICD-10-CM

## 2019-02-24 MED ORDER — HYDROCODONE-ACETAMINOPHEN 10-325 MG PO TABS: 1.0000 | ORAL_TABLET | Freq: Three times a day (TID) | ORAL | 0 refills | Status: DC | PRN

## 2019-02-24 NOTE — Progress Notes (Signed)
Pain Management Encounter Note - Virtual Visit via Telephone Telehealth (real-time audio visits between healthcare provider and patient).  Patient's Phone No. & Preferred Pharmacy:  [phone] (home); [phone] (mobile); (Preferred) [phone]  [id number] [place], [place] - [address] [facility] [address] [place] [id number] Phone: [phone] Fax: [phone]   Pre-screening note:  Our staff contacted Mariah Poole and offered her an "in person", "face-to-face" appointment versus a telephone encounter. She indicated preferring the telephone encounter, at this time.  Reason for Virtual Visit: COVID-19*  Social distancing based on CDC and AMA recommendations.   I contacted Mariah Poole on 02/24/2019 at 10:31 AM by telephone and clearly identified myself as Thad Rangerrystal Harsh Trulock, NP. I verified that I was speaking with the correct person using two identifiers (Name and date of birth: [birth year]).  Advanced Informed Consent I sought verbal advanced consent from Mariah Poole for telemedicine interactions and virtual visit. I informed Mariah Poole of the security and privacy concerns, risks, and limitations associated with performing an evaluation and management service by telephone. I also informed Mariah Poole of the availability of "in person" appointments and I informed her of the possibility of a patient responsible charge related to this service. Mariah Poole expressed understanding and agreed to proceed.   Historic Elements   [name] is a 73 y.o. year old, female patient evaluated today after her last encounter by our practice on 01/28/2019. Mariah Poole  has a past medical history of Anemia, Arthritis, COPD (chronic obstructive pulmonary disease) (HCC), Depression, Diabetes mellitus without complication (HCC), GERD (gastroesophageal reflux disease), Hiatal hernia, History of contrast media allergy. (IVP dye) (09/01/2015),  History of hiatal hernia, Hypercholesteremia, Hypertension, Morbid obesity (HCC), Scoliosis, and Shortness of breath dyspnea. She also  has a past surgical history that includes Abdominal hysterectomy; Cataract extraction w/ intraocular lens implant (Right); Gastric bypass; Hernia repair; Cataract extraction w/PHACO (Left, 07/19/2015); Cholecystectomy; and Hernia repair. Mariah Poole has a current medication list which includes the following prescription(s): albuterol, atenolol, vitamin d3, duloxetine, furosemide, hydrocodone-acetaminophen, hydrocodone-acetaminophen, hydrocodone-acetaminophen, magnesium oxide, multi-vitamins, pantoprazole, potassium chloride sa, pravastatin, tramadol, turmeric curcumin, and vitamin c. She  reports that she has never smoked. She has never used smokeless tobacco. She reports current drug use. She reports that she does not drink alcohol. Mariah Poole is allergic to contrast media [iodinated diagnostic agents] and shellfish allergy.   HPI  I last saw her on 11/27/2018. She is being evaluated for both, medication management and a post-procedure assessment. She is not having any pain that this time. She admits that she has given up sugar and this has helped her ankle pain.     Post-Procedure Evaluation  Procedure: Bilateral Lumbar Facet. Pre-procedure pain level:  6/10 Post-procedure: 0/10          Sedation: Please see nurses note.  Effectiveness during initial hour after procedure(Ultra-Short Term Relief): 100 %  Local anesthetic used: Long-acting (4-6 hours) Effectiveness: Defined as any analgesic benefit obtained secondary to the administration of local anesthetics. This carries significant diagnostic value as to the etiological location, or anatomical origin, of the pain. Duration of benefit is expected to coincide with the duration of the local anesthetic used.  Effectiveness during initial 4-6 hours after procedure(Short-Term Relief): 100 %  Long-term benefit: Defined as  any relief past the pharmacologic duration of the local anesthetics.  Effectiveness past the initial 6 hours after procedure(Long-Term Relief): 100 %  Current  benefits: Defined as benefit that persist at this time.   Analgesia:  50% improved Function: Mariah Poole reports improvement in function ROM: Mariah Poole reports improvement in ROM  Pharmacotherapy Assessment  Analgesic: Hydrocodone/APAP 10/325 mg three times a day MME/day:  30 mg/day.   Monitoring: Pharmacotherapy: No side-effects or adverse reactions reported. Mariah Poole PMP: PDMP reviewed during this encounter.       Compliance: No problems identified. Plan: Refer to "POC".  Review of recent tests  DG C-Arm 1-60 Min-No Report Fluoroscopy was utilized by the requesting physician.  No radiographic  interpretation.    Clinical Support on 08/27/2018  Component Date Value Ref Range Status  . Summary 08/27/2018 FINAL   Final   Comment: ==================================================================== TOXASSURE SELECT 13 (MW) ==================================================================== Test                             Result       Flag       Units Drug Present not Declared for Prescription Verification   Tramadol                       1151         UNEXPECTED ng/mg creat   O-Desmethyltramadol            2134         UNEXPECTED ng/mg creat   N-Desmethyltramadol            5179         UNEXPECTED ng/mg creat    Source of tramadol is a prescription medication.    O-desmethyltramadol and N-desmethyltramadol are expected    metabolites of tramadol. Drug Absent but Declared for Prescription Verification   Hydrocodone                    Not Detected UNEXPECTED ng/mg creat ==================================================================== Test                      Result    Flag   Units      Ref Range   Creatinine              47               mg/dL      >=40>=20 =======================================                           ============================= Declared Medications:  The flagging and interpretation on this report are based on the  following declared medications.  Unexpected results may arise from  inaccuracies in the declared medications.  **Note: The testing scope of this panel includes these medications:  Hydrocodone (Norco)  **Note: The testing scope of this panel does not include following  reported medications:  Acetaminophen (Norco)  Albuterol (Proventil)  Ascorbic Acid  Atenolol (Tenormin)  Duloxetine (Cymbalta)  Furosemide (Lasix)  Gabapentin (Neurontin)  Ibuprofen (Advil)  Magnesium (Mag-Ox)  Multivitamin  Pantoprazole (Protonix)  Potassium (K-Dur)  Pravastatin (Pravachol)  Turmeric  Vitamin D3 ==================================================================== For clinical consultation, please call [phone]. ====================================================================    Assessment  The primary encounter diagnosis was Spondylosis without myelopathy or radiculopathy, lumbosacral region. Diagnoses of Chronic pain syndrome, DDD (degenerative disc disease), lumbar, and Rheumatoid arthritis involving both hands with positive rheumatoid factor (HCC) were also pertinent to this visit.  Plan of Care  I have changed Mariah Poole's HYDROcodone-acetaminophen, HYDROcodone-acetaminophen,  and HYDROcodone-acetaminophen. I am also having her maintain her potassium chloride SA, pantoprazole, furosemide, pravastatin, atenolol, albuterol, Vitamin D3, Multi-Vitamins, Turmeric Curcumin, magnesium oxide, vitamin C, DULoxetine, and traMADol.  Pharmacotherapy (Medications Ordered): Meds ordered this encounter  Medications  . HYDROcodone-acetaminophen (NORCO) 10-325 MG tablet    Sig: Take 1 tablet by mouth every 8 (eight) hours as needed for up to 30 days for severe pain.    Dispense:  90 tablet    Refill:  0    Do not place this medication, or any other prescription from our  practice, on "Automatic Refill". Patient may have prescription filled one day early if pharmacy is closed on scheduled refill date.    Order Specific Question:   Supervising Provider    Answer:   Delano Metz [phone]  . HYDROcodone-acetaminophen (NORCO) 10-325 MG tablet    Sig: Take 1 tablet by mouth every 8 (eight) hours as needed for up to 30 days for severe pain.    Dispense:  90 tablet    Refill:  0    Do not place this medication, or any other prescription from our practice, on "Automatic Refill". Patient may have prescription filled one day early if pharmacy is closed on scheduled refill date.    Order Specific Question:   Supervising Provider    Answer:   Delano Metz [phone]  . HYDROcodone-acetaminophen (NORCO) 10-325 MG tablet    Sig: Take 1 tablet by mouth every 8 (eight) hours as needed for up to 30 days for severe pain.    Dispense:  90 tablet    Refill:  0    Do not place this medication, or any other prescription from our practice, on "Automatic Refill". Patient may have prescription filled one day early if pharmacy is closed on scheduled refill date.    Order Specific Question:   Supervising Provider    Answer:   Delano Metz [phone]   Orders:  No orders of the defined types were placed in this encounter.  Follow-up plan:   Return in about 3 months (around 05/26/2019) for MedMgmt.   I discussed the assessment and treatment plan with the patient. The patient was provided an opportunity to ask questions and all were answered. The patient agreed with the plan and demonstrated an understanding of the instructions.  Patient advised to call back or seek an in-person evaluation if the symptoms or condition worsens.  Total duration of non-face-to-face encounter: 13 minutes.  Note by: Thad Ranger, NP Date: 02/24/2019; Time: 12:32 PM  Disclaimer:  * Given the special circumstances of the COVID-19 pandemic, the federal government has announced that the Office  for Civil Rights (OCR) will exercise its enforcement discretion and will not impose penalties on physicians using telehealth in the event of noncompliance with regulatory requirements under the [employer] Portability and Accountability Act (HIPAA) in connection with the good faith provision of telehealth during the COVID-19 national public health emergency. (AMA)

## 2019-05-26 ENCOUNTER — Ambulatory Visit: Payer: Medicare Other | Attending: Pain Medicine | Admitting: Pain Medicine

## 2019-05-26 ENCOUNTER — Encounter: Payer: Self-pay | Admitting: Pain Medicine

## 2019-05-26 ENCOUNTER — Other Ambulatory Visit: Payer: Self-pay

## 2019-05-26 ENCOUNTER — Other Ambulatory Visit: Payer: Self-pay | Admitting: Pain Medicine

## 2019-05-26 DIAGNOSIS — Z789 Other specified health status: Secondary | ICD-10-CM

## 2019-05-26 DIAGNOSIS — M069 Rheumatoid arthritis, unspecified: Secondary | ICD-10-CM

## 2019-05-26 DIAGNOSIS — M19041 Primary osteoarthritis, right hand: Secondary | ICD-10-CM

## 2019-05-26 DIAGNOSIS — M05741 Rheumatoid arthritis with rheumatoid factor of right hand without organ or systems involvement: Secondary | ICD-10-CM

## 2019-05-26 DIAGNOSIS — M792 Neuralgia and neuritis, unspecified: Secondary | ICD-10-CM

## 2019-05-26 DIAGNOSIS — M19042 Primary osteoarthritis, left hand: Secondary | ICD-10-CM

## 2019-05-26 DIAGNOSIS — M21949 Unspecified acquired deformity of hand, unspecified hand: Secondary | ICD-10-CM

## 2019-05-26 DIAGNOSIS — M21969 Unspecified acquired deformity of unspecified lower leg: Secondary | ICD-10-CM | POA: Diagnosis not present

## 2019-05-26 DIAGNOSIS — M05742 Rheumatoid arthritis with rheumatoid factor of left hand without organ or systems involvement: Secondary | ICD-10-CM

## 2019-05-26 DIAGNOSIS — Z79899 Other long term (current) drug therapy: Secondary | ICD-10-CM

## 2019-05-26 DIAGNOSIS — G894 Chronic pain syndrome: Secondary | ICD-10-CM

## 2019-05-26 DIAGNOSIS — G8929 Other chronic pain: Secondary | ICD-10-CM

## 2019-05-26 DIAGNOSIS — M899 Disorder of bone, unspecified: Secondary | ICD-10-CM

## 2019-05-26 DIAGNOSIS — M545 Low back pain, unspecified: Secondary | ICD-10-CM

## 2019-05-26 MED ORDER — HYDROCODONE-ACETAMINOPHEN 10-325 MG PO TABS: 1.0000 | ORAL_TABLET | Freq: Three times a day (TID) | ORAL | 0 refills | Status: DC | PRN

## 2019-05-26 MED ORDER — MELOXICAM 15 MG PO TABS: 15.0000 mg | ORAL_TABLET | Freq: Every day | ORAL | 0 refills | Status: DC

## 2019-05-26 MED ORDER — PREGABALIN 25 MG PO CAPS: ORAL_CAPSULE | ORAL | 0 refills | Status: DC

## 2019-05-26 NOTE — Patient Instructions (Signed)
____________________________________________________________________________________________  Medication Recommendations and Reminders  Applies to: All patients receiving prescriptions (written and/or electronic).  Medication Rules & Regulations: These rules and regulations exist for your safety and that of others. They are not flexible and neither are we. Dismissing or ignoring them will be considered "non-compliance" with medication therapy, resulting in complete and irreversible termination of such therapy. (See document titled "Medication Rules" for more details.) In all conscience, because of safety reasons, we cannot continue providing a therapy where the patient does not follow instructions.  Pharmacy of record:   Definition: This is the pharmacy where your electronic prescriptions will be sent.   We do not endorse any particular pharmacy.  You are not restricted in your choice of pharmacy.  The pharmacy listed in the electronic medical record should be the one where you want electronic prescriptions to be sent.  If you choose to change pharmacy, simply notify our nursing staff of your choice of new pharmacy.  Recommendations:  Keep all of your pain medications in a safe place, under lock and key, even if you live alone.   After you fill your prescription, take 1 week's worth of pills and put them away in a safe place. You should keep a separate, properly labeled bottle for this purpose. The remainder should be kept in the original bottle. Use this as your primary supply, until it runs out. Once it's gone, then you know that you have 1 week's worth of medicine, and it is time to come in for a prescription refill. If you do this correctly, it is unlikely that you will ever run out of medicine.  To make sure that the above recommendation works, it is very important that you make sure your medication refill appointments are scheduled at least 1 week before you run out of medicine. To do  this in an effective manner, make sure that you do not leave the office without scheduling your next medication management appointment. Always ask the nursing staff to show you in your prescription , when your medication will be running out. Then arrange for the receptionist to get you a return appointment, at least 7 days before you run out of medicine. Do not wait until you have 1 or 2 pills left, to come in. This is very poor planning and does not take into consideration that we may need to cancel appointments due to bad weather, sickness, or emergencies affecting our staff.  "Partial Fill": If for any reason your pharmacy does not have enough pills/tablets to completely fill or refill your prescription, do not allow for a "partial fill". You will need a separate prescription to fill the remaining amount, which we will not provide. If the reason for the partial fill is your insurance, you will need to talk to the pharmacist about payment alternatives for the remaining tablets, but again, do not accept a partial fill.  Prescription refills and/or changes in medication(s):   Prescription refills, and/or changes in dose or medication, will be conducted only during scheduled medication management appointments. (Applies to both, written and electronic prescriptions.)  No refills on procedure days. No medication will be changed or started on procedure days. No changes, adjustments, and/or refills will be conducted on a procedure day. Doing so will interfere with the diagnostic portion of the procedure.  No phone refills. No medications will be "called into the pharmacy".  No Fax refills.  No weekend refills.  No Holliday refills.  No after hours refills.  Remember:  Business   hours are:  Monday to Thursday 8:00 AM to 4:00 PM Provider's Schedule: Crystal King, NP - Appointments are:  Medication management: Monday to Thursday 8:00 AM to 4:00 PM Raffi Milstein, MD - Appointments are:   Medication management: Monday and Wednesday 8:00 AM to 4:00 PM Procedure day: Tuesday and Thursday 7:30 AM to 4:00 PM Bilal Lateef, MD - Appointments are:  Medication management: Tuesday and Thursday 8:00 AM to 4:00 PM Procedure day: Monday and Wednesday 7:30 AM to 4:00 PM (Last update: 01/16/2018) ____________________________________________________________________________________________    

## 2019-05-26 NOTE — Progress Notes (Signed)
Pain Management Virtual Encounter Note - Virtual Visit via Telephone Telehealth (real-time audio visits between healthcare provider and patient).   Patient's Phone No. & Preferred Pharmacy:  [phone] (home); [phone] (mobile); (Preferred) [phone] [email or web address]  [facility][id number] Nicholes Rough- [place], [place] - [address] AT [facility] [address] [place] [address] Phone: [phone] Fax: [phone]    Pre-screening note:  Our staff contacted Mariah Poole and offered her an "in person", "face-to-face" appointment versus a telephone encounter. She indicated preferring the telephone encounter, at this time.   Reason for Virtual Visit: COVID-19*  Social distancing based on CDC and AMA recommendations.   I contacted Charlann Nosslla W Debrosse on 05/26/2019 via telephone.      I clearly identified myself as Oswaldo DoneFrancisco A Rondalyn Belford, MD. I verified that I was speaking with the correct person using two identifiers (Name: Mariah Featherslla W Tipping, and date of birth: March 17, 1946).  Advanced Informed Consent I sought verbal advanced consent from Mariah FeathersElla W Udall for virtual visit interactions. I informed Mariah Poole of possible security and privacy concerns, risks, and limitations associated with providing "not-in-person" medical evaluation and management services. I also informed Mariah Poole of the availability of "in-person" appointments. Finally, I informed her that there would be a charge for the virtual visit and that she could be  personally, fully or partially, financially responsible for it. Mariah Poole expressed understanding and agreed to proceed.   Historic Elements   Mariah Poole is a 73 y.o. year old, female patient evaluated today after her last encounter by our practice on 02/24/2019. Mariah Poole  has a past medical history of Anemia, Arthritis, COPD (chronic obstructive pulmonary disease) (HCC), Depression, Diabetes mellitus without complication (HCC),  GERD (gastroesophageal reflux disease), Hiatal hernia, History of contrast media allergy. (IVP dye) (09/01/2015), History of hiatal hernia, Hypercholesteremia, Hypertension, Morbid obesity (HCC), Scoliosis, and Shortness of breath dyspnea. She also  has a past surgical history that includes Abdominal hysterectomy; Cataract extraction w/ intraocular lens implant (Right); Gastric bypass; Hernia repair; Cataract extraction w/PHACO (Left, 07/19/2015); Cholecystectomy; and Hernia repair. Mariah Poole has a current medication list which includes the following prescription(s): albuterol, atenolol, vitamin d3, duloxetine, furosemide, magnesium oxide, multi-vitamins, pantoprazole, potassium chloride sa, pravastatin, tramadol, turmeric curcumin, vitamin c, hydrocodone-acetaminophen, hydrocodone-acetaminophen, hydrocodone-acetaminophen, meloxicam, and pregabalin. She  reports that she has never smoked. She has never used smokeless tobacco. She reports current drug use. She reports that she does not drink alcohol. Mariah Poole is allergic to contrast media [iodinated diagnostic agents] and shellfish allergy.   HPI  Today, she is being contacted for medication management.  PMP review shows that the patient has been receiving tramadol for a long time from Dr. Lynford CitizenElena Marie Adamo.  Today I will be sending her a letter to see if we can stop duplicating services.  I would prefer to have the patient stop taking the tramadol.  To assist with her arthritis pain, I will go ahead and start the patient on a trial of Mobic 15 mg p.o. daily.  She also complains of neuropathic pain for which we will provide her with a trial of Lyrica 25 mg daily and we will increase it up to 450 mg/day, as tolerated.  Pharmacotherapy Assessment  Analgesic: Hydrocodone/APAP 10/325 one every 8 hours PRN (30 mg/day) MME/day:30 mg/day.   Monitoring: Pharmacotherapy: No side-effects or adverse reactions reported. [place] PMP: PDMP reviewed during this  encounter.       Compliance: No problems identified.  Effectiveness: Clinically acceptable. Plan: Refer to "POC".  Pertinent Labs   SAFETY SCREENING Profile No results found for: SARSCOV2NAA, COVIDSOURCE, STAPHAUREUS, MRSAPCR, HCVAB, HIV, PREGTESTUR Renal Function Lab Results  Component Value Date   BUN 21 05/20/2017   CREATININE 0.73 05/20/2017   BCR 29 (H) 05/20/2017   GFRAA 95 05/20/2017   GFRNONAA 83 05/20/2017   Hepatic Function Lab Results  Component Value Date   AST 18 05/20/2017   ALT 9 05/20/2017   ALBUMIN 4.5 05/20/2017   UDS Summary  Date Value Ref Range Status  08/27/2018 FINAL  Final    Comment:    ==================================================================== TOXASSURE SELECT 13 (MW) ==================================================================== Test                             Result       Flag       Units Drug Present not Declared for Prescription Verification   Tramadol                       1151         UNEXPECTED ng/mg creat   O-Desmethyltramadol            2134         UNEXPECTED ng/mg creat   N-Desmethyltramadol            5179         UNEXPECTED ng/mg creat    Source of tramadol is a prescription medication.    O-desmethyltramadol and N-desmethyltramadol are expected    metabolites of tramadol. Drug Absent but Declared for Prescription Verification   Hydrocodone                    Not Detected UNEXPECTED ng/mg creat ==================================================================== Test                      Result    Flag   Units      Ref Range   Creatinine              47               mg/dL      >=20 ==================================================================== Declared Medications:  The flagging and interpretation on this report are based on the  following declared medications.  Unexpected results may arise from  inaccuracies in the declared medications.  **Note: The testing scope of this panel includes these  medications:  Hydrocodone (Norco)  **Note: The testing scope of this panel does not include following  reported medications:  Acetaminophen (Norco)  Albuterol (Proventil)  Ascorbic Acid  Atenolol (Tenormin)  Duloxetine (Cymbalta)  Furosemide (Lasix)  Gabapentin (Neurontin)  Ibuprofen (Advil)  Magnesium (Mag-Ox)  Multivitamin  Pantoprazole (Protonix)  Potassium (K-Dur)  Pravastatin (Pravachol)  Turmeric  Vitamin D3 ==================================================================== For clinical consultation, please call [phone]. ====================================================================    Note: Above Lab results reviewed.  Recent imaging  DG C-Arm 1-60 Min-No Report Fluoroscopy was utilized by the requesting physician.  No radiographic  interpretation.   Assessment  The primary encounter diagnosis was Chronic pain syndrome. Diagnoses of Rheumatoid arthritis involving both hands with positive rheumatoid factor ([place]), Deformity of hand due to rheumatoid arthritis ([place]), Deformity of foot due to rheumatoid arthritis ([employer]), Chronic low back pain (Primary area of Pain) (Bilateral) (R>L), Neurogenic pain, Primary osteoarthritis of both hands, Pharmacologic therapy, Disorder of skeletal system, and Problems influencing health status were also pertinent  to this visit.  Plan of Care  I have discontinued Jaleia W. Priore's HYDROcodone-acetaminophen and HYDROcodone-acetaminophen. I have also changed her HYDROcodone-acetaminophen. Additionally, I am having her start on HYDROcodone-acetaminophen, HYDROcodone-acetaminophen, pregabalin, and meloxicam. Lastly, I am having her maintain her potassium chloride SA, pantoprazole, furosemide, pravastatin, atenolol, albuterol, Vitamin D3, Multi-Vitamins, Turmeric Curcumin, magnesium oxide, vitamin C, DULoxetine, and traMADol.  Pharmacotherapy (Medications Ordered): Meds ordered this encounter  Medications  .  HYDROcodone-acetaminophen (NORCO) 10-325 MG tablet    Sig: Take 1 tablet by mouth every 8 (eight) hours as needed for severe pain. Must last 30 days    Dispense:  90 tablet    Refill:  0    Chronic Pain: STOP Act (Not applicable) Fill 1 day early if closed on refill date. Do not fill until: 05/29/2019. To last until: 06/28/2019. Avoid benzodiazepines within 8 hours of opioids  . HYDROcodone-acetaminophen (NORCO) 10-325 MG tablet    Sig: Take 1 tablet by mouth every 8 (eight) hours as needed for severe pain. Must last 30 days    Dispense:  90 tablet    Refill:  0    Chronic Pain: STOP Act (Not applicable) Fill 1 day early if closed on refill date. Do not fill until: 06/28/2019. To last until: 07/28/2019. Avoid benzodiazepines within 8 hours of opioids  . HYDROcodone-acetaminophen (NORCO) 10-325 MG tablet    Sig: Take 1 tablet by mouth every 8 (eight) hours as needed for severe pain. Must last 30 days    Dispense:  90 tablet    Refill:  0    Chronic Pain: STOP Act (Not applicable) Fill 1 day early if closed on refill date. Do not fill until: 07/28/2019. To last until: 08/27/2019. Avoid benzodiazepines within 8 hours of opioids  . pregabalin (LYRICA) 25 MG capsule    Sig: Take 1 capsule (25 mg total) by mouth daily for 7 days, THEN 1 capsule (25 mg total) 2 (two) times daily for 7 days, THEN 1 capsule (25 mg total) 3 (three) times daily for 16 days.    Dispense:  69 capsule    Refill:  0    Fill one day early if pharmacy is closed on scheduled refill date. May substitute for generic if available.  . meloxicam (MOBIC) 15 MG tablet    Sig: Take 1 tablet (15 mg total) by mouth daily.    Dispense:  30 tablet    Refill:  0    Fill one day early if pharmacy is closed on scheduled refill date. May substitute for generic if available.   Orders:  Orders Placed This Encounter  Procedures  . ToxASSURE Select 13 (MW), Urine    Volume: 30 ml(s). Minimum 3 ml of urine is needed. Document temperature of fresh  sample. Indications: Long term (current) use of opiate analgesic (Z79.891)  . Comp. Metabolic Panel (12)    With GFR. Indications: Chronic Pain Syndrome (G89.4) & Pharmacotherapy (N05.397)    Order Specific Question:   Has the patient fasted?    Answer:   No    Order Specific Question:   CC Results    Answer:   PCP-NURSE [[id number]]  . Magnesium    Indication: Pharmacologic therapy (Q73.419)    Order Specific Question:   CC Results    Answer:   PCP-NURSE [[id number]]  . Vitamin B12    Indication: Pharmacologic therapy (O97.353).    Order Specific Question:   CC Results    Answer:   PCP-NURSE [[id number]]  . Sedimentation rate  Indication: Disorder of skeletal system (M89.9)    Order Specific Question:   CC Results    Answer:   PCP-NURSE [[id number]]  . 25-Hydroxyvitamin D Lcms D2+D3    Indication: Disorder of skeletal system (M89.9).    Order Specific Question:   CC Results    Answer:   PCP-NURSE [[id number]]  . C-reactive protein    Indication: Problems influencing health status (Z78.9)    Order Specific Question:   CC Results    Answer:   PCP-NURSE [[id number]][[id number]]   Follow-up plan:   Return in 1 month (on 06/26/2019) for (VV), E/M (MM) to evaluate Mobic & Lyrica Trial.     Considering:   Possible bilateral lumbar facet RFA    Palliative PRN treatment(s):   Palliative bilateral lumbar facet blocks (#3)  Palliativeintra-articular hip joint injection Palliative LESIs     Recent Visits No visits were found meeting these conditions.  Showing recent visits within past 90 days and meeting all other requirements   Today's Visits Date Type Provider Dept  05/26/19 Office Visit Delano MetzNaveira, Mina Carlisi, MD [facility]  Showing today's visits and meeting all other requirements   Future Appointments No visits were found meeting these conditions.  Showing future appointments within next 90 days and meeting all other requirements   I discussed the assessment and treatment plan with the patient.  The patient was provided an opportunity to ask questions and all were answered. The patient agreed with the plan and demonstrated an understanding of the instructions.  Patient advised to call back or seek an in-person evaluation if the symptoms or condition worsens.  Total duration of non-face-to-face encounter: 25 minutes.  Note by: Oswaldo DoneFrancisco A Len Kluver, MD Date: 05/26/2019; Time: 12:46 PM  Note: This dictation was prepared with Dragon dictation. Any transcriptional errors that may result from this process are unintentional.  Disclaimer:  * Given the special circumstances of the COVID-19 pandemic, the federal government has announced that the Office for Civil Rights (OCR) will exercise its enforcement discretion and will not impose penalties on physicians using telehealth in the event of noncompliance with regulatory requirements under the [employer] Insurance Portability and Accountability Act (HIPAA) in connection with the good faith provision of telehealth during the COVID-19 national public health emergency. (AMA)

## 2019-06-19 LAB — TOXASSURE SELECT 13 (MW), URINE

## 2019-06-21 LAB — VITAMIN B12: Vitamin B-12: 1006 pg/mL (ref 232–1245)

## 2019-06-21 LAB — SEDIMENTATION RATE: Sed Rate: 49 mm/hr — ABNORMAL HIGH (ref 0–40)

## 2019-06-21 LAB — COMP. METABOLIC PANEL (12)
AST: 15 IU/L (ref 0–40)
Albumin/Globulin Ratio: 1.7 (ref 1.2–2.2)
Albumin: 4.3 g/dL (ref 3.7–4.7)
Alkaline Phosphatase: 101 IU/L (ref 39–117)
BUN/Creatinine Ratio: 21 (ref 12–28)
BUN: 24 mg/dL (ref 8–27)
Bilirubin Total: 0.4 mg/dL (ref 0.0–1.2)
Calcium: 9.3 mg/dL (ref 8.7–10.3)
Chloride: 103 mmol/L (ref 96–106)
Creatinine, Ser: 1.17 mg/dL — ABNORMAL HIGH (ref 0.57–1.00)
GFR calc Af Amer: 53 mL/min/{1.73_m2} — ABNORMAL LOW (ref >59)
GFR calc non Af Amer: 46 mL/min/{1.73_m2} — ABNORMAL LOW (ref >59)
Globulin, Total: 2.6 g/dL (ref 1.5–4.5)
Glucose: 129 mg/dL — ABNORMAL HIGH (ref 65–99)
Potassium: 3.9 mmol/L (ref 3.5–5.2)
Sodium: 141 mmol/L (ref 134–144)
Total Protein: 6.9 g/dL (ref 6.0–8.5)

## 2019-06-21 LAB — MAGNESIUM: Magnesium: 1.9 mg/dL (ref 1.6–2.3)

## 2019-06-21 LAB — 25-HYDROXY VITAMIN D LCMS D2+D3
25-Hydroxy, Vitamin D-2: <1 ng/mL
25-Hydroxy, Vitamin D-3: 45 ng/mL
25-Hydroxy, Vitamin D: 45 ng/mL

## 2019-06-21 LAB — C-REACTIVE PROTEIN: CRP: 7 mg/L (ref 0–10)

## 2019-06-23 ENCOUNTER — Encounter: Payer: Self-pay | Admitting: Pain Medicine

## 2019-06-23 DIAGNOSIS — M05742 Rheumatoid arthritis with rheumatoid factor of left hand without organ or systems involvement: Secondary | ICD-10-CM | POA: Insufficient documentation

## 2019-06-23 DIAGNOSIS — M05741 Rheumatoid arthritis with rheumatoid factor of right hand without organ or systems involvement: Secondary | ICD-10-CM | POA: Insufficient documentation

## 2019-06-23 NOTE — Progress Notes (Signed)
Please call on land line.

## 2019-06-23 NOTE — Progress Notes (Signed)
Pain Management Virtual Encounter Note - Virtual Visit via Telephone Telehealth (real-time audio visits between healthcare provider and patient).   Patient's Phone No. & Preferred Pharmacy:  [phone] (home); [phone] (mobile); (Preferred) [phone] [id number] Nicholes Rough, [place] - [address] AT [facility] [address] [place] [address] Phone: [phone] Fax: [phone]    Pre-screening note:  Our staff contacted Mariah Poole and offered her an "in person", "face-to-face" appointment versus a telephone encounter. She indicated preferring the telephone encounter, at this time.   Reason for Virtual Visit: COVID-19*  Social distancing based on CDC and AMA recommendations.   I contacted Glenyce Randle Sao on 06/24/2019 via telephone.      I clearly identified myself as Oswaldo Done, MD. I verified that I was speaking with the correct person using two identifiers (Name: Mariah Poole, and date of birth: [birth year]).  Advanced Informed Consent I sought verbal advanced consent from Braxton Feathers for virtual visit interactions. I informed [name] of possible security and privacy concerns, risks, and limitations associated with providing "not-in-person" medical evaluation and management services. I also informed [name] of the availability of "in-person" appointments. Finally, I informed her that there would be a charge for the virtual visit and that she could be  personally, fully or partially, financially responsible for it. [name] expressed understanding and agreed to proceed.   Historic Elements   Mariah Poole is a 73 y.o. year old, female patient evaluated today after her last encounter by our practice on 05/26/2019. Mariah Poole  has a past medical history of Anemia, Arthritis, COPD (chronic obstructive pulmonary disease) (HCC), Depression, Diabetes mellitus without complication (HCC),  GERD (gastroesophageal reflux disease), Hiatal hernia, History of contrast media allergy. (IVP dye) (09/01/2015), History of hiatal hernia, Hypercholesteremia, Hypertension, Morbid obesity (HCC), Scoliosis, and Shortness of breath dyspnea. She also  has a past surgical history that includes Abdominal hysterectomy; Cataract extraction w/ intraocular lens implant (Right); Gastric bypass; Hernia repair; Cataract extraction w/PHACO (Left, 07/19/2015); Cholecystectomy; and Hernia repair. Mariah Poole has a current medication list which includes the following prescription(s): albuterol, atenolol, vitamin d3, duloxetine, furosemide, hydrocodone-acetaminophen, hydrocodone-acetaminophen, hydrocodone-acetaminophen, magnesium oxide, meloxicam, pantoprazole, potassium chloride sa, pravastatin, pregabalin, pregabalin, turmeric curcumin, and vitamin c. She  reports that she has never smoked. She has never used smokeless tobacco. She reports current drug use. She reports that she does not drink alcohol. Mariah Poole is allergic to contrast media [iodinated diagnostic agents] and shellfish allergy.   HPI  Today, she is being contacted for medication management.  The patient was found to be receiving other opioid analgesics in the form of tramadol from Dr. Lynford Citizen.  The patient is being contacted today to evaluate her Mobic and Lyrica trials.  According to the patient she is doing well on her Mobic and Lyrica and she is not having any type of side effects to it.  Therefore, I will be increasing the Lyrica from 75 mg/day to 100 mg/day and then we will keep increasing it as she tolerates it.  Pharmacotherapy Assessment  Analgesic: Hydrocodone/APAP 10/325, 1 tab PO q 8 hrs PRN (30 mg/day of hydrocodone) (975 mg/day of acetaminophen) (has enough to last until 08/27/2019) the patient was found to be receiving other opioid analgesics in the form of tramadol from Dr. Lynford Citizen.  MME/day:30 mg/day.    Monitoring: Pharmacotherapy: No side-effects or adverse reactions reported. [place] PMP:  PDMP reviewed during this encounter.       Compliance: Medication agreement violation - unsanctioned acquisition/use of additional opioid-containing medication. The patient was found to be receiving other opioid analgesics in the form of tramadol from Dr. Lynford Citizen.  Effectiveness: Clinically acceptable. Plan: Refer to "POC".  UDS:  Summary  Date Value Ref Range Status  06/17/2019 Note  Final    Comment:    ==================================================================== ToxASSURE Select 13 (MW) ==================================================================== Test                             Result       Flag       Units Drug Present and Declared for Prescription Verification   Hydrocodone                    6196         EXPECTED   ng/mg creat   Hydromorphone                  497          EXPECTED   ng/mg creat   Dihydrocodeine                 947          EXPECTED   ng/mg creat   Norhydrocodone                 >4902        EXPECTED   ng/mg creat    Sources of hydrocodone include scheduled prescription medications.    Hydromorphone, dihydrocodeine and norhydrocodone are expected    metabolites of hydrocodone. Hydromorphone and dihydrocodeine are    also available as scheduled prescription medications. Drug Absent but Declared for Prescription Verification   Tramadol                       Not Detected UNEXPECTED ng/mg creat ==================================================================== Test                      Result    Flag   Units      Ref Range   Creatinine              102              mg/dL      >=72 ==================================================================== Declared Medications:  The flagging and interpretation on this report are based on the  following declared medications.  Unexpected results may arise from  inaccuracies in the declared medications.   **Note: The testing scope of this panel includes these medications:  Hydrocodone (Norco)  Tramadol (Ultram)  **Note: The testing scope of this panel does not include the  following reported medications:  Acetaminophen (Norco)  Albuterol  Atenolol (Tenormin)  Duloxetine (Cymbalta)  Furosemide (Lasix)  Magnesium (Mag-Ox)  Meloxicam (Mobic)  Multivitamin  Pantoprazole (Protonix)  Potassium  Pravastatin (Pravachol)  Pregabalin (Lyrica)  Turmeric  Vitamin C  Vitamin D3 ==================================================================== For clinical consultation, please call [phone]. ====================================================================    Laboratory Chemistry Profile (12 mo)  Renal: 06/17/2019: BUN 24; BUN/Creatinine Ratio 21; Creatinine, Ser 1.17; GFR calc Af Amer 53; GFR calc non Af Amer 46  Hepatic: 06/17/2019: Albumin 4.3; AST 15 Other: 06/17/2019: 25-Hydroxy, Vitamin D 45; 25-Hydroxy, Vitamin D-2 <1.0; 25-Hydroxy, Vitamin D-3 45; CRP 7; Sed Rate 49; Vitamin B-12 1,006 Note: Above Lab results reviewed.  Imaging  Last 90 days:  No results found. Last Hospital Admission:  Dg C-arm 1-60 Min-no Report  Result Date: 01/27/2019 Fluoroscopy was utilized by the requesting physician.  No radiographic interpretation.   Assessment  The primary encounter diagnosis was Chronic pain syndrome. Diagnoses of Chronic low back pain (Primary area of Pain) (Bilateral) (R>L), Rheumatoid arthritis involving both hands with positive rheumatoid factor ([place]), Neurogenic pain, and Primary osteoarthritis of both hands were also pertinent to this visit.  Plan of Care  I have discontinued Mikita W. Tash's Multi-Vitamins and traMADol. I have also changed her pregabalin. Additionally, I am having her start on pregabalin. Lastly, I am having her maintain her potassium chloride SA, pantoprazole, furosemide, pravastatin, atenolol, albuterol, Vitamin D3, Turmeric Curcumin, magnesium oxide,  vitamin C, DULoxetine, HYDROcodone-acetaminophen, HYDROcodone-acetaminophen, HYDROcodone-acetaminophen, and meloxicam.  Pharmacotherapy (Medications Ordered): Meds ordered this encounter  Medications  . pregabalin (LYRICA) 50 MG capsule    Sig: Take 1 capsule (50 mg total) by mouth 2 (two) times daily for 7 days, THEN 1 capsule (50 mg total) 3 (three) times daily for 7 days.    Dispense:  35 capsule    Refill:  0    Fill one day early if pharmacy is closed on scheduled refill date. May substitute for generic if available.  . meloxicam (MOBIC) 15 MG tablet    Sig: Take 1 tablet (15 mg total) by mouth daily.    Dispense:  64 tablet    Refill:  0    Fill one day early if pharmacy is closed on scheduled refill date. May substitute for generic if available.  . pregabalin (LYRICA) 75 MG capsule    Sig: Take 1 capsule (75 mg total) by mouth 2 (two) times daily for 7 days, THEN 1 capsule (75 mg total) 3 (three) times daily for 7 days.    Dispense:  35 capsule    Refill:  0    Fill one day early if pharmacy is closed on scheduled refill date. May substitute for generic if available.   Orders:  No orders of the defined types were placed in this encounter.  Follow-up plan:   Return in about 26 days (around 07/20/2019) for (VV), E/M (MM) to evaluate the Lyrica titration.      Considering:   Possible bilateral lumbar facet RFA    Palliative PRN treatment(s):   Palliative bilateral lumbar facet blocks (#3)  Palliativeintra-articular hip joint injection Palliative LESIs     Recent Visits Date Type Provider Dept  05/26/19 Office Visit Milinda Pointer, MD [facility]  Showing recent visits within past 90 days and meeting all other requirements   Today's Visits Date Type Provider Dept  06/24/19 Office Visit Milinda Pointer, MD [facility]  Showing today's visits and meeting all other requirements   Future Appointments No visits were found meeting these  conditions.  Showing future appointments within next 90 days and meeting all other requirements   I discussed the assessment and treatment plan with the patient. The patient was provided an opportunity to ask questions and all were answered. The patient agreed with the plan and demonstrated an understanding of the instructions.  Patient advised to call back or seek an in-person evaluation if the symptoms or condition worsens.  Total duration of non-face-to-face encounter: 20 minutes.  Note by: Gaspar Cola, MD Date: 06/24/2019; Time: 10:20 AM  Note: This dictation was prepared with Dragon dictation. Any transcriptional errors that may result from this process are unintentional.  Disclaimer:  * Given the special  circumstances of the COVID-19 pandemic, the federal government has announced that [employer] for Civil Rights (OCR) will exercise its enforcement discretion and will not impose penalties on physicians using telehealth in the event of noncompliance with regulatory requirements under the [place] and [place] (HIPAA) in connection with the good faith provision of telehealth during the [place]-35 national public health emergency. ([place])

## 2019-06-24 ENCOUNTER — Telehealth: Payer: Self-pay | Admitting: Pain Medicine

## 2019-06-24 ENCOUNTER — Ambulatory Visit: Payer: Medicare Other | Attending: Pain Medicine | Admitting: Pain Medicine

## 2019-06-24 ENCOUNTER — Other Ambulatory Visit: Payer: Self-pay | Admitting: Pain Medicine

## 2019-06-24 ENCOUNTER — Other Ambulatory Visit: Payer: Self-pay

## 2019-06-24 DIAGNOSIS — M792 Neuralgia and neuritis, unspecified: Secondary | ICD-10-CM

## 2019-06-24 DIAGNOSIS — M19042 Primary osteoarthritis, left hand: Secondary | ICD-10-CM

## 2019-06-24 DIAGNOSIS — M19041 Primary osteoarthritis, right hand: Secondary | ICD-10-CM

## 2019-06-24 DIAGNOSIS — M545 Low back pain: Secondary | ICD-10-CM

## 2019-06-24 DIAGNOSIS — M05741 Rheumatoid arthritis with rheumatoid factor of right hand without organ or systems involvement: Secondary | ICD-10-CM | POA: Diagnosis not present

## 2019-06-24 DIAGNOSIS — M05742 Rheumatoid arthritis with rheumatoid factor of left hand without organ or systems involvement: Secondary | ICD-10-CM

## 2019-06-24 DIAGNOSIS — G8929 Other chronic pain: Secondary | ICD-10-CM

## 2019-06-24 DIAGNOSIS — G894 Chronic pain syndrome: Secondary | ICD-10-CM | POA: Diagnosis not present

## 2019-06-24 MED ORDER — MELOXICAM 15 MG PO TABS: 15.0000 mg | ORAL_TABLET | Freq: Every day | ORAL | 0 refills | Status: DC

## 2019-06-24 MED ORDER — PREGABALIN 50 MG PO CAPS: ORAL_CAPSULE | ORAL | 0 refills | Status: DC

## 2019-06-24 MED ORDER — PREGABALIN 75 MG PO CAPS: ORAL_CAPSULE | ORAL | 0 refills | Status: DC

## 2019-06-24 NOTE — Patient Instructions (Signed)
____________________________________________________________________________________________  Medication Rules  Purpose: To inform patients, and their family members, of our rules and regulations.  Applies to: All patients receiving prescriptions (written or electronic).  Pharmacy of record: Pharmacy where electronic prescriptions will be sent. If written prescriptions are taken to a different pharmacy, please inform the nursing staff. The pharmacy listed in the electronic medical record should be the one where you would like electronic prescriptions to be sent.  Electronic prescriptions: In compliance with the [place] Strengthen Opioid Misuse Prevention (STOP) Act of 2017 (Session Law 2017-74/H243), effective November 19, 2018, all controlled substances must be electronically prescribed. Calling prescriptions to the pharmacy will cease to exist.  Prescription refills: Only during scheduled appointments. Applies to all prescriptions.  NOTE: The following applies primarily to controlled substances (Opioid* Pain Medications).   Patient's responsibilities: 1. Pain Pills: Bring all pain pills to every appointment (except for procedure appointments). 2. Pill Bottles: Bring pills in original pharmacy bottle. Always bring the newest bottle. Bring bottle, even if empty. 3. Medication refills: You are responsible for knowing and keeping track of what medications you take and those you need refilled. The day before your appointment: write a list of all prescriptions that need to be refilled. The day of the appointment: give the list to the admitting nurse. Prescriptions will be written only during appointments. No prescriptions will be written on procedure days. If you forget a medication: it will not be "Called in", "Faxed", or "electronically sent". You will need to get another appointment to get these prescribed. No early refills. Do not call asking to have your prescription filled  early. 4. Prescription Accuracy: You are responsible for carefully inspecting your prescriptions before leaving our office. Have the discharge nurse carefully go over each prescription with you, before taking them home. Make sure that your name is accurately spelled, that your address is correct. Check the name and dose of your medication to make sure it is accurate. Check the number of pills, and the written instructions to make sure they are clear and accurate. Make sure that you are given enough medication to last until your next medication refill appointment. 5. Taking Medication: Take medication as prescribed. When it comes to controlled substances, taking less pills or less frequently than prescribed is permitted and encouraged. Never take more pills than instructed. Never take medication more frequently than prescribed.  6. Inform other Doctors: Always inform, all of your healthcare providers, of all the medications you take. 7. Pain Medication from other Providers: You are not allowed to accept any additional pain medication from any other Doctor or Healthcare provider. There are two exceptions to this rule. (see below) In the event that you require additional pain medication, you are responsible for notifying us, as stated below. 8. Medication Agreement: You are responsible for carefully reading and following our Medication Agreement. This must be signed before receiving any prescriptions from our practice. Safely store a copy of your signed Agreement. Violations to the Agreement will result in no further prescriptions. (Additional copies of our Medication Agreement are available upon request.) 9. Laws, Rules, & Regulations: All patients are expected to follow all Federal and State Laws, Statutes, Rules, & Regulations. Ignorance of the Laws does not constitute a valid excuse. The use of any illegal substances is prohibited. 10. Adopted CDC guidelines & recommendations: Target dosing levels will be  at or below 60 MME/day. Use of benzodiazepines** is not recommended.  Exceptions: There are only two exceptions to the rule of not   receiving pain medications from other Healthcare Providers. 1. Exception #1 (Emergencies): In the event of an emergency (i.e.: accident requiring emergency care), you are allowed to receive additional pain medication. However, you are responsible for: As soon as you are able, call our office [phone], at any time of the day or night, and leave a message stating your name, the date and nature of the emergency, and the name and dose of the medication prescribed. In the event that your call is answered by a member of our staff, make sure to document and save the date, time, and the name of the person that took your information.  2. Exception #2 (Planned Surgery): In the event that you are scheduled by another doctor or dentist to have any type of surgery or procedure, you are allowed (for a period no longer than 30 days), to receive additional pain medication, for the acute post-op pain. However, in this case, you are responsible for picking up a copy of our "Post-op Pain Management for Surgeons" handout, and giving it to your surgeon or dentist. This document is available at our office, and does not require an appointment to obtain it. Simply go to our office during business hours (Monday-Thursday from 8:00 AM to 4:00 PM) (Friday 8:00 AM to 12:00 Noon) or if you have a scheduled appointment with us, prior to your surgery, and ask for it by name. In addition, you will need to provide us with your name, name of your surgeon, type of surgery, and date of procedure or surgery.  *Opioid medications include: morphine, codeine, oxycodone, oxymorphone, hydrocodone, hydromorphone, meperidine, tramadol, tapentadol, buprenorphine, fentanyl, methadone. **Benzodiazepine medications include: diazepam (Valium), alprazolam (Xanax), clonazepam (Klonopine), lorazepam (Ativan), clorazepate  (Tranxene), chlordiazepoxide (Librium), estazolam (Prosom), oxazepam (Serax), temazepam (Restoril), triazolam (Halcion) (Last updated: 01/16/2018) ____________________________________________________________________________________________   ____________________________________________________________________________________________  Medication Recommendations and Reminders  Applies to: All patients receiving prescriptions (written and/or electronic).  Medication Rules & Regulations: These rules and regulations exist for your safety and that of others. They are not flexible and neither are we. Dismissing or ignoring them will be considered "non-compliance" with medication therapy, resulting in complete and irreversible termination of such therapy. (See document titled "Medication Rules" for more details.) In all conscience, because of safety reasons, we cannot continue providing a therapy where the patient does not follow instructions.  Pharmacy of record:   Definition: This is the pharmacy where your electronic prescriptions will be sent.   We do not endorse any particular pharmacy.  You are not restricted in your choice of pharmacy.  The pharmacy listed in the electronic medical record should be the one where you want electronic prescriptions to be sent.  If you choose to change pharmacy, simply notify our nursing staff of your choice of new pharmacy.  Recommendations:  Keep all of your pain medications in a safe place, under lock and key, even if you live alone.   After you fill your prescription, take 1 week's worth of pills and put them away in a safe place. You should keep a separate, properly labeled bottle for this purpose. The remainder should be kept in the original bottle. Use this as your primary supply, until it runs out. Once it's gone, then you know that you have 1 week's worth of medicine, and it is time to come in for a prescription refill. If you do this correctly, it  is unlikely that you will ever run out of medicine.  To make sure that the above recommendation works,   it is very important that you make sure your medication refill appointments are scheduled at least 1 week before you run out of medicine. To do this in an effective manner, make sure that you do not leave the office without scheduling your next medication management appointment. Always ask the nursing staff to show you in your prescription , when your medication will be running out. Then arrange for the receptionist to get you a return appointment, at least 7 days before you run out of medicine. Do not wait until you have 1 or 2 pills left, to come in. This is very poor planning and does not take into consideration that we may need to cancel appointments due to bad weather, sickness, or emergencies affecting our staff.  "Partial Fill": If for any reason your pharmacy does not have enough pills/tablets to completely fill or refill your prescription, do not allow for a "partial fill". You will need a separate prescription to fill the remaining amount, which we will not provide. If the reason for the partial fill is your insurance, you will need to talk to the pharmacist about payment alternatives for the remaining tablets, but again, do not accept a partial fill.  Prescription refills and/or changes in medication(s):   Prescription refills, and/or changes in dose or medication, will be conducted only during scheduled medication management appointments. (Applies to both, written and electronic prescriptions.)  No refills on procedure days. No medication will be changed or started on procedure days. No changes, adjustments, and/or refills will be conducted on a procedure day. Doing so will interfere with the diagnostic portion of the procedure.  No phone refills. No medications will be "called into the pharmacy".  No Fax refills.  No weekend refills.  No Holliday refills.  No after hours  refills.  Remember:  Business hours are:  Monday to Thursday 8:00 AM to 4:00 PM Provider's Schedule: Cydnee Fuquay, MD - Appointments are:  Medication management: Monday and Wednesday 8:00 AM to 4:00 PM Procedure day: Tuesday and Thursday 7:30 AM to 4:00 PM Bilal Lateef, MD - Appointments are:  Medication management: Tuesday and Thursday 8:00 AM to 4:00 PM Procedure day: Monday and Wednesday 7:30 AM to 4:00 PM (Last update: 01/16/2018) ____________________________________________________________________________________________   ____________________________________________________________________________________________  CANNABIDIOL (AKA: CBD Oil or Pills)  Applies to: All patients receiving prescriptions of controlled substances (written and/or electronic).  General Information: Cannabidiol (CBD) was discovered in 1940. It is one of some 113 identified cannabinoids in cannabis (Marijuana) plants, accounting for up to 40% of the plant's extract. As of 2018, preliminary clinical research on cannabidiol included studies of anxiety, cognition, movement disorders, and pain.  Cannabidiol is consummed in multiple ways, including inhalation of cannabis smoke or vapor, as an aerosol spray into the cheek, and by mouth. It may be supplied as CBD oil containing CBD as the active ingredient (no added tetrahydrocannabinol (THC) or terpenes), a full-plant CBD-dominant hemp extract oil, capsules, dried cannabis, or as a liquid solution. CBD is thought not have the same psychoactivity as THC, and may affect the actions of THC. Studies suggest that CBD may interact with different biological targets, including cannabinoid receptors and other neurotransmitter receptors. As of 2018 the mechanism of action for its biological effects has not been determined.  In the [place], cannabidiol has a limited approval by the Food and Drug Administration (FDA) for treatment of only two types of epilepsy  disorders. The side effects of long-term use of the drug include somnolence, decreased appetite, diarrhea,   fatigue, malaise, weakness, sleeping problems, and others.  CBD remains a Schedule I drug prohibited for any use.  Legality: Some manufacturers ship CBD products nationally, an illegal action which the FDA has not enforced in 2018, with CBD remaining the subject of an FDA investigational new drug evaluation, and is not considered legal as a dietary supplement or food ingredient as of December 2018. Federal illegality has made it difficult historically to conduct research on CBD. CBD is openly sold in head shops and health food stores in some states where such sales have not been explicitly legalized.  Warning: Because it is not FDA approved for general use or treatment of pain, it is not required to undergo the same manufacturing controls as prescription drugs.  This means that the available cannabidiol (CBD) may be contaminated with THC.  If this is the case, it will trigger a positive urine drug screen (UDS) test for cannabinoids (Marijuana).  Because a positive UDS for illicit substances is a violation of our medication agreement, your opioid analgesics (pain medicine) may be permanently discontinued. (Last update: 02/06/2018) ____________________________________________________________________________________________    

## 2019-06-24 NOTE — Telephone Encounter (Signed)
Message sent to Dr Naveira 

## 2019-06-24 NOTE — Telephone Encounter (Signed)
Mariah Poole from [facility] called. They need to verify Scripts for Lyrica, they received 2 scripts diff Strength. Please call to verify which is correct.

## 2019-08-26 ENCOUNTER — Other Ambulatory Visit: Payer: Self-pay

## 2019-08-26 ENCOUNTER — Ambulatory Visit: Payer: Medicare Other | Attending: Pain Medicine | Admitting: Pain Medicine

## 2019-08-26 DIAGNOSIS — G8929 Other chronic pain: Secondary | ICD-10-CM

## 2019-08-26 DIAGNOSIS — M545 Low back pain: Secondary | ICD-10-CM

## 2019-08-26 DIAGNOSIS — M25551 Pain in right hip: Secondary | ICD-10-CM

## 2019-08-26 DIAGNOSIS — M792 Neuralgia and neuritis, unspecified: Secondary | ICD-10-CM | POA: Diagnosis not present

## 2019-08-26 DIAGNOSIS — G894 Chronic pain syndrome: Secondary | ICD-10-CM | POA: Diagnosis not present

## 2019-08-26 DIAGNOSIS — M05741 Rheumatoid arthritis with rheumatoid factor of right hand without organ or systems involvement: Secondary | ICD-10-CM

## 2019-08-26 DIAGNOSIS — M19042 Primary osteoarthritis, left hand: Secondary | ICD-10-CM

## 2019-08-26 DIAGNOSIS — M7918 Myalgia, other site: Secondary | ICD-10-CM

## 2019-08-26 DIAGNOSIS — M19041 Primary osteoarthritis, right hand: Secondary | ICD-10-CM

## 2019-08-26 DIAGNOSIS — M05742 Rheumatoid arthritis with rheumatoid factor of left hand without organ or systems involvement: Secondary | ICD-10-CM

## 2019-08-26 MED ORDER — PREGABALIN 50 MG PO CAPS: 50.0000 mg | ORAL_CAPSULE | Freq: Two times a day (BID) | ORAL | 0 refills | Status: DC

## 2019-08-26 MED ORDER — HYDROCODONE-ACETAMINOPHEN 10-325 MG PO TABS: 1.0000 | ORAL_TABLET | Freq: Three times a day (TID) | ORAL | 0 refills | Status: DC | PRN

## 2019-08-26 MED ORDER — MAGNESIUM OXIDE 400 (241.3 MG) MG PO TABS: 400.0000 mg | ORAL_TABLET | Freq: Every day | ORAL | 0 refills | Status: DC

## 2019-08-26 MED ORDER — MELOXICAM 15 MG PO TABS: 15.0000 mg | ORAL_TABLET | Freq: Every day | ORAL | 0 refills | Status: DC

## 2019-08-26 NOTE — Progress Notes (Signed)
Pain Management Virtual Encounter Note - Virtual Visit via Telephone Telehealth (real-time audio visits between healthcare provider and patient).   Patient's Phone No. & Preferred Pharmacy:  [phone] (home); [phone] (mobile); (Preferred) [phone] [id number] [place], [place] - [address] AT [facility] OF [address] [place] [address] Phone: [phone] Fax: [phone]    Pre-screening note:  Our staff contacted Mariah Poole and offered her an "in person", "face-to-face" appointment versus a telephone encounter. She indicated preferring the telephone encounter, at this time.   Reason for Virtual Visit: COVID-19*  Social distancing based on CDC and AMA recommendations.   I contacted Mariah Poole on 08/26/2019 via telephone.      I clearly identified myself as Oswaldo Done, MD. I verified that I was speaking with the correct person using two identifiers (Name: Mariah Poole, and date of birth: July 03, 1946).  Advanced Informed Consent I sought verbal advanced consent from Mariah Poole for virtual visit interactions. I informed Mariah Poole of possible security and privacy concerns, risks, and limitations associated with providing "not-in-person" medical evaluation and management services. I also informed Mariah Poole of the availability of "in-person" appointments. Finally, I informed her that there would be a charge for the virtual visit and that she could be  personally, fully or partially, financially responsible for it. [name] expressed understanding and agreed to proceed.   Historic Elements   Mariah Poole is a 73 y.o. year old, female patient evaluated today after her last encounter by our practice on 06/24/2019. Ms. Dietzen  has a past medical history of Anemia, Arthritis, COPD (chronic obstructive pulmonary disease) (HCC), Depression, Diabetes mellitus without complication (HCC),  GERD (gastroesophageal reflux disease), Hiatal hernia, History of contrast media allergy. (IVP dye) (09/01/2015), History of hiatal hernia, Hypercholesteremia, Hypertension, Morbid obesity (HCC), Scoliosis, and Shortness of breath dyspnea. She also  has a past surgical history that includes Abdominal hysterectomy; Cataract extraction w/ intraocular lens implant (Right); Gastric bypass; Hernia repair; Cataract extraction w/PHACO (Left, 07/19/2015); Cholecystectomy; and Hernia repair. Mariah Poole has a current medication list which includes the following prescription(s): albuterol, atenolol, vitamin d3, duloxetine, furosemide, hydrocodone-acetaminophen, hydrocodone-acetaminophen, hydrocodone-acetaminophen, magnesium oxide, meloxicam, pantoprazole, potassium chloride sa, pravastatin, turmeric curcumin, vitamin c, and pregabalin. She  reports that she has never smoked. She has never used smokeless tobacco. She reports current drug use. She reports that she does not drink alcohol. Ms. Scogin is allergic to contrast media [iodinated diagnostic agents] and shellfish allergy.   HPI  Today, she is being contacted for medication management.  Today we went over the Lyrica trial and the patient indicated that the medication completely eliminated that neuropathic pain that she was experiencing in her foot.  However, she was unable to tolerate the 75 mg p.o. 3 times daily but she indicates that she did extremely well with the 50 mg p.o. twice daily therefore, today I will go ahead and refill the medication at 50 mg p.o. twice daily and I will provide her with a 90-day prescription on it.  She is doing well with everything else and she requested that I refill her magnesium.  I told her that she needed to keep it to no more than 1 tablet/day due to her chronic kidney disease.  She understood and accepted.  I also instructed her to be very careful with her Mobic so asked to also minimize its use.  Pharmacotherapy Assessment   Analgesic:  Hydrocodone/APAP 10/325, 1 tab PO q 8 hrs PRN (30 mg/day of hydrocodone) (975 mg/day of acetaminophen) MME/day:30 mg/day.   Monitoring: Pharmacotherapy: No side-effects or adverse reactions reported. [place] PMP: PDMP reviewed during this encounter.       Compliance: No problems identified. Effectiveness: Clinically acceptable. Plan: Refer to "POC".  UDS:  Summary  Date Value Ref Range Status  06/17/2019 Note  Final    Comment:    ==================================================================== ToxASSURE Select 13 (MW) ==================================================================== Test                             Result       Flag       Units Drug Present and Declared for Prescription Verification   Hydrocodone                    6196         EXPECTED   ng/mg creat   Hydromorphone                  497          EXPECTED   ng/mg creat   Dihydrocodeine                 947          EXPECTED   ng/mg creat   Norhydrocodone                 >4902        EXPECTED   ng/mg creat    Sources of hydrocodone include scheduled prescription medications.    Hydromorphone, dihydrocodeine and norhydrocodone are expected    metabolites of hydrocodone. Hydromorphone and dihydrocodeine are    also available as scheduled prescription medications. Drug Absent but Declared for Prescription Verification   Tramadol                       Not Detected UNEXPECTED ng/mg creat ==================================================================== Test                      Result    Flag   Units      Ref Range   Creatinine              102              mg/dL      >=20 ==================================================================== Declared Medications:  The flagging and interpretation on this report are based on the  following declared medications.  Unexpected results may arise from  inaccuracies in the declared medications.  **Note: The testing scope of this panel includes these  medications:  Hydrocodone (Norco)  Tramadol (Ultram)  **Note: The testing scope of this panel does not include the  following reported medications:  Acetaminophen (Norco)  Albuterol  Atenolol (Tenormin)  Duloxetine (Cymbalta)  Furosemide (Lasix)  Magnesium (Mag-Ox)  Meloxicam (Mobic)  Multivitamin  Pantoprazole (Protonix)  Potassium  Pravastatin (Pravachol)  Pregabalin (Lyrica)  Turmeric  Vitamin C  Vitamin D3 ==================================================================== For clinical consultation, please call [phone]. ====================================================================    Laboratory Chemistry Profile (12 mo)  Renal: 06/17/2019: BUN 24; BUN/Creatinine Ratio 21; Creatinine, Ser 1.17  Lab Results  Component Value Date   GFRAA 53 (L) 06/17/2019   GFRNONAA 46 (L) 06/17/2019   Hepatic: 06/17/2019: Albumin 4.3 Lab Results  Component Value Date   AST 15 06/17/2019   ALT 9 05/20/2017   Other: 06/17/2019: 25-Hydroxy, Vitamin D  45; 25-Hydroxy, Vitamin D-2 <1.0; 25-Hydroxy, Vitamin D-3 45; CRP 7; Sed Rate 49; Vitamin B-12 1,006 Note: Above Lab results reviewed.  Imaging  Last 90 days:  No results found.  Assessment  The primary encounter diagnosis was Chronic pain syndrome. Diagnoses of Chronic low back pain (Primary area of Pain) (Bilateral) (R>L), Chronic hip pain (Right), Neurogenic pain, Rheumatoid arthritis involving both hands with positive rheumatoid factor (HCC), Primary osteoarthritis of both hands, and Chronic musculoskeletal pain were also pertinent to this visit.  Plan of Care  I have discontinued Carlita W. Ishida's pregabalin. I have also changed her pregabalin and magnesium oxide. Additionally, I am having her start on HYDROcodone-acetaminophen and HYDROcodone-acetaminophen. Lastly, I am having her maintain her potassium chloride SA, pantoprazole, furosemide, pravastatin, atenolol, albuterol, Vitamin D3, Turmeric Curcumin, vitamin C,  DULoxetine, meloxicam, and HYDROcodone-acetaminophen.  Pharmacotherapy (Medications Ordered): Meds ordered this encounter  Medications  . pregabalin (LYRICA) 50 MG capsule    Sig: Take 1 capsule (50 mg total) by mouth 2 (two) times daily.    Dispense:  200 capsule    Refill:  0    Fill one day early if pharmacy is closed on scheduled refill date. May substitute for generic if available.  . meloxicam (MOBIC) 15 MG tablet    Sig: Take 1 tablet (15 mg total) by mouth daily.    Dispense:  100 tablet    Refill:  0    Fill one day early if pharmacy is closed on scheduled refill date. May substitute for generic if available.  Marland Kitchen. HYDROcodone-acetaminophen (NORCO) 10-325 MG tablet    Sig: Take 1 tablet by mouth every 8 (eight) hours as needed for severe pain. Must last 30 days    Dispense:  90 tablet    Refill:  0    Chronic Pain: STOP Act (Not applicable) Fill 1 day early if closed on refill date. Do not fill until: 08/27/2019. To last until: 09/26/2019. Avoid benzodiazepines within 8 hours of opioids  . HYDROcodone-acetaminophen (NORCO) 10-325 MG tablet    Sig: Take 1 tablet by mouth every 8 (eight) hours as needed for severe pain. Must last 30 days    Dispense:  90 tablet    Refill:  0    Chronic Pain: STOP Act (Not applicable) Fill 1 day early if closed on refill date. Do not fill until: 09/26/2019. To last until: 10/26/2019. Avoid benzodiazepines within 8 hours of opioids  . HYDROcodone-acetaminophen (NORCO) 10-325 MG tablet    Sig: Take 1 tablet by mouth every 8 (eight) hours as needed for severe pain. Must last 30 days    Dispense:  90 tablet    Refill:  0    Chronic Pain: STOP Act (Not applicable) Fill 1 day early if closed on refill date. Do not fill until: 10/26/2019. To last until: 11/25/2019. Avoid benzodiazepines within 8 hours of opioids  . magnesium oxide (MAG-OX) 400 (241.3 Mg) MG tablet    Sig: Take 1 tablet (400 mg total) by mouth daily.    Dispense:  100 tablet    Refill:  0     Fill one day early if pharmacy is closed on scheduled refill date. May substitute for generic if available.   Orders:  No orders of the defined types were placed in this encounter.  Follow-up plan:   Return in about 13 weeks (around 11/25/2019) for (VV), (MM).      Considering:   Possible bilateral lumbar facet RFA    Palliative PRN treatment(s):  Palliative bilateral lumbar facet blocks (#3)  Palliativeintra-articular hip joint injection Palliative LESIs      Recent Visits Date Type Provider Dept  06/24/19 Office Visit Delano Metz, MD [facility]  Showing recent visits within past 90 days and meeting all other requirements   Today's Visits Date Type Provider Dept  08/26/19 Office Visit Delano Metz, MD [facility]  Showing today's visits and meeting all other requirements   Future Appointments No visits were found meeting these conditions.  Showing future appointments within next 90 days and meeting all other requirements   I discussed the assessment and treatment plan with the patient. The patient was provided an opportunity to ask questions and all were answered. The patient agreed with the plan and demonstrated an understanding of the instructions.  Patient advised to call back or seek an in-person evaluation if the symptoms or condition worsens.  Total duration of non-face-to-face encounter: 15 minutes.  Note by: Oswaldo Done, MD Date: 08/26/2019; Time: 12:50 PM  Note: This dictation was prepared with Dragon dictation. Any transcriptional errors that may result from this process are unintentional.  Disclaimer:  * Given the special circumstances of the COVID-19 pandemic, the federal government has announced that the Office for Civil Rights (OCR) will exercise its enforcement discretion and will not impose penalties on physicians using telehealth in the event of noncompliance with regulatory requirements under the [employer]  Portability and Accountability Act (HIPAA) in connection with the good faith provision of telehealth during the COVID-19 national public health emergency. (AMA)

## 2019-09-18 DIAGNOSIS — R0602 Shortness of breath: Secondary | ICD-10-CM | POA: Insufficient documentation

## 2019-09-18 DIAGNOSIS — R0609 Other forms of dyspnea: Secondary | ICD-10-CM | POA: Insufficient documentation

## 2019-09-18 DIAGNOSIS — E876 Hypokalemia: Secondary | ICD-10-CM | POA: Insufficient documentation

## 2019-09-22 DIAGNOSIS — I5033 Acute on chronic diastolic (congestive) heart failure: Secondary | ICD-10-CM | POA: Insufficient documentation

## 2019-09-22 DIAGNOSIS — D509 Iron deficiency anemia, unspecified: Secondary | ICD-10-CM | POA: Insufficient documentation

## 2019-09-22 DIAGNOSIS — E785 Hyperlipidemia, unspecified: Secondary | ICD-10-CM | POA: Insufficient documentation

## 2019-09-22 DIAGNOSIS — I5032 Chronic diastolic (congestive) heart failure: Secondary | ICD-10-CM | POA: Insufficient documentation

## 2019-09-22 DIAGNOSIS — R4589 Other symptoms and signs involving emotional state: Secondary | ICD-10-CM | POA: Insufficient documentation

## 2019-09-22 DIAGNOSIS — K219 Gastro-esophageal reflux disease without esophagitis: Secondary | ICD-10-CM | POA: Insufficient documentation

## 2019-09-22 DIAGNOSIS — J4520 Mild intermittent asthma, uncomplicated: Secondary | ICD-10-CM | POA: Insufficient documentation

## 2019-11-17 ENCOUNTER — Encounter: Payer: Self-pay | Admitting: Pain Medicine

## 2019-11-17 ENCOUNTER — Telehealth: Payer: Self-pay

## 2019-11-17 NOTE — Telephone Encounter (Signed)
Left patient a message to call us back so that we may go over her medications and allergies prior to her virtual appointment on Monday.

## 2019-11-18 DIAGNOSIS — R9431 Abnormal electrocardiogram [ECG] [EKG]: Secondary | ICD-10-CM | POA: Insufficient documentation

## 2019-11-18 DIAGNOSIS — R0602 Shortness of breath: Secondary | ICD-10-CM | POA: Insufficient documentation

## 2019-11-18 DIAGNOSIS — R600 Localized edema: Secondary | ICD-10-CM | POA: Insufficient documentation

## 2019-11-18 DIAGNOSIS — N183 Chronic kidney disease, stage 3 unspecified: Secondary | ICD-10-CM | POA: Insufficient documentation

## 2019-11-22 NOTE — Progress Notes (Signed)
Virtual Encounter - Pain Management PROVIDER NOTE: Information contained herein reflects review and annotations entered in association with encounter. Interpretation of such information and data should be left to medically-trained personnel. Information provided to patient can be located elsewhere in the medical record under "Patient Instructions". Document created using STT-dictation technology, any transcriptional errors that may result from process are unintentional.    Contact & Pharmacy Preferred: [phone] Home: [phone] (home) Mobile: [phone] (mobile) E-mail: [id number] [place], [place] - [address] AT [facility] [address] [place] [address] Phone: [phone] Fax: [phone]   Pre-screening  Mariah Poole offered "in-person" vs "virtual" encounter. She indicated preferring virtual for this encounter.   Reason COVID-19*  Social distancing based on CDC and AMA recommendations.   I contacted Mariah Poole on 11/23/2019 via telephone.      I clearly identified myself as Mariah Done, MD. I verified that I was speaking with the correct person using two identifiers (Name: Mariah Poole, and date of birth: 12-Oct-1946).  Consent I sought verbal advanced consent from Braxton Feathers for virtual visit interactions. I informed Mariah Poole of possible security and privacy concerns, risks, and limitations associated with providing "not-in-person" medical evaluation and management services. I also informed Mariah Poole of the availability of "in-person" appointments. Finally, I informed her that there would be a charge for the virtual visit and that she could be  personally, fully or partially, financially responsible for it. Mariah Poole expressed understanding and agreed to proceed.   Historic Elements   Mariah Poole is a 74 y.o. year old, female patient evaluated today after her last  encounter by our practice on 11/17/2019. [name]  has a past medical history of Anemia, Arthritis, COPD (chronic obstructive pulmonary disease) (HCC), Depression, Diabetes mellitus without complication (HCC), GERD (gastroesophageal reflux disease), Hiatal hernia, History of contrast media allergy. (IVP dye) (09/01/2015), History of hiatal hernia, Hypercholesteremia, Hypertension, Morbid obesity (HCC), Scoliosis, and Shortness of breath dyspnea. She also  has a past surgical history that includes Abdominal hysterectomy; Cataract extraction w/ intraocular lens implant (Right); Gastric bypass; Hernia repair; Cataract extraction w/PHACO (Left, 07/19/2015); Cholecystectomy; and Hernia repair. Mariah Poole has a current medication list which includes the following prescription(s): albuterol, atenolol, vitamin d3, duloxetine, ferosul, furosemide, [START ON 11/25/2019] hydrocodone-acetaminophen, [START ON 12/25/2019] hydrocodone-acetaminophen, [START ON 01/24/2020] hydrocodone-acetaminophen, [START ON 12/04/2019] magnesium oxide, [START ON 12/04/2019] meloxicam, pantoprazole, potassium chloride sa, pravastatin, [START ON 12/04/2019] pregabalin, symbicort, turmeric curcumin, and vitamin c. She  reports that she has never smoked. She has never used smokeless tobacco. She reports current drug use. She reports that she does not drink alcohol. Mariah Poole is allergic to contrast media [iodinated diagnostic agents]; ioxaglate; and shellfish allergy.   HPI  Today, she is being contacted for medication management.  The patient indicates doing well with the current medication regimen. No adverse reactions or side effects reported to the medications.  The patient was recently hospitalized due to pneumonia.  She apparently had a lot of fluid accumulation.  Today I have renewed her medications but I have warned her to check with her primary care physician before continuing with the meloxicam due to her kidney problems.  She indicated that  she would, but Mariah Poole tends to forget things and hopefully this is one that she will not.  Pharmacotherapy Assessment  Analgesic: Hydrocodone/APAP 10/325, 1 tab PO q 8 hrs PRN (30 mg/day  of hydrocodone) (975 mg/day of acetaminophen) MME/day:30 mg/day.   Monitoring: Pharmacotherapy: No side-effects or adverse reactions reported. [place] PMP: PDMP reviewed during this encounter.       Compliance: No problems identified. Effectiveness: Clinically acceptable. Plan: Refer to "POC".  UDS:  Summary  Date Value Ref Range Status  06/17/2019 Note  Final    Comment:    ==================================================================== ToxASSURE Select 13 (MW) ==================================================================== Test                             Result       Flag       Units Drug Present and Declared for Prescription Verification   Hydrocodone                    6196         EXPECTED   ng/mg creat   Hydromorphone                  497          EXPECTED   ng/mg creat   Dihydrocodeine                 947          EXPECTED   ng/mg creat   Norhydrocodone                 >4902        EXPECTED   ng/mg creat    Sources of hydrocodone include scheduled prescription medications.    Hydromorphone, dihydrocodeine and norhydrocodone are expected    metabolites of hydrocodone. Hydromorphone and dihydrocodeine are    also available as scheduled prescription medications. Drug Absent but Declared for Prescription Verification   Tramadol                       Not Detected UNEXPECTED ng/mg creat ==================================================================== Test                      Result    Flag   Units      Ref Range   Creatinine              102              mg/dL      >=20 ==================================================================== Declared Medications:  The flagging and interpretation on this report are based on the  following declared medications.  Unexpected results  may arise from  inaccuracies in the declared medications.  **Note: The testing scope of this panel includes these medications:  Hydrocodone (Norco)  Tramadol (Ultram)  **Note: The testing scope of this panel does not include the  following reported medications:  Acetaminophen (Norco)  Albuterol  Atenolol (Tenormin)  Duloxetine (Cymbalta)  Furosemide (Lasix)  Magnesium (Mag-Ox)  Meloxicam (Mobic)  Multivitamin  Pantoprazole (Protonix)  Potassium  Pravastatin (Pravachol)  Pregabalin (Lyrica)  Turmeric  Vitamin C  Vitamin D3 ==================================================================== For clinical consultation, please call [phone]. ====================================================================    Laboratory Chemistry Profile (12 mo)  Renal: 06/17/2019: BUN 24; BUN/Creatinine Ratio 21; Creatinine, Ser 1.17  Lab Results  Component Value Date   GFRAA 53 (L) 06/17/2019   GFRNONAA 46 (L) 06/17/2019   Hepatic: 06/17/2019: Albumin 4.3 Lab Results  Component Value Date   AST 15 06/17/2019   ALT 9 05/20/2017   Other: 06/17/2019: 25-Hydroxy, Vitamin D 45; 25-Hydroxy, Vitamin D-2 <1.0; 25-Hydroxy, Vitamin D-3 45; CRP 7;  Sed Rate 49; Vitamin B-12 1,006 Note: Above Lab results reviewed.  Imaging  DG C-Arm 1-60 Min-No Report Fluoroscopy was utilized by the requesting physician.  No radiographic  interpretation.    Assessment  Diagnoses of Chronic pain syndrome, Chronic musculoskeletal pain, Neurogenic pain, Rheumatoid arthritis involving both hands with positive rheumatoid factor (HCC), and Primary osteoarthritis of both hands were pertinent to this visit.  Plan of Care  Problem-specific:  No problem-specific Assessment & Plan notes found for this encounter.  I am having Charlann Noss. Pennel start on HYDROcodone-acetaminophen and HYDROcodone-acetaminophen. I am also having her maintain her potassium chloride SA, pantoprazole, pravastatin, atenolol, albuterol,  Vitamin D3, Turmeric Curcumin, vitamin C, DULoxetine, FeroSul, Symbicort, furosemide, HYDROcodone-acetaminophen, magnesium oxide, pregabalin, and meloxicam.  Pharmacotherapy (Medications Ordered): Meds ordered this encounter  Medications  . HYDROcodone-acetaminophen (NORCO) 10-325 MG tablet    Sig: Take 1 tablet by mouth every 8 (eight) hours as needed for severe pain. Must last 30 days    Dispense:  90 tablet    Refill:  0    Chronic Pain: STOP Act (Not applicable) Fill 1 day early if closed on refill date. Do not fill until: 11/25/2019. To last until: 12/25/2019. Avoid benzodiazepines within 8 hours of opioids  . HYDROcodone-acetaminophen (NORCO) 10-325 MG tablet    Sig: Take 1 tablet by mouth every 8 (eight) hours as needed for severe pain. Must last 30 days    Dispense:  90 tablet    Refill:  0    Chronic Pain: STOP Act (Not applicable) Fill 1 day early if closed on refill date. Do not fill until: 12/25/2019. To last until: 01/24/2020. Avoid benzodiazepines within 8 hours of opioids  . HYDROcodone-acetaminophen (NORCO) 10-325 MG tablet    Sig: Take 1 tablet by mouth every 8 (eight) hours as needed for severe pain. Must last 30 days    Dispense:  90 tablet    Refill:  0    Chronic Pain: STOP Act (Not applicable) Fill 1 day early if closed on refill date. Do not fill until: 01/24/2020. To last until: 02/23/2020. Avoid benzodiazepines within 8 hours of opioids  . magnesium oxide (MAG-OX) 400 (241.3 Mg) MG tablet    Sig: Take 1 tablet (400 mg total) by mouth daily.    Dispense:  30 tablet    Refill:  5    Fill one day early if pharmacy is closed on scheduled refill date. May substitute for generic if available.  . pregabalin (LYRICA) 50 MG capsule    Sig: Take 1 capsule (50 mg total) by mouth 2 (two) times daily.    Dispense:  60 capsule    Refill:  5    Fill one day early if pharmacy is closed on scheduled refill date. May substitute for generic if available.  . meloxicam (MOBIC) 15 MG tablet     Sig: Take 1 tablet (15 mg total) by mouth daily.    Dispense:  30 tablet    Refill:  5    Fill one day early if pharmacy is closed on scheduled refill date. May substitute for generic if available.   Orders:  No orders of the defined types were placed in this encounter.  Follow-up plan:   Return in about 13 weeks (around 02/22/2020) for (VV), (MM).      Considering:   Possible bilateral lumbar facet RFA    Palliative PRN treatment(s):   Palliative bilateral lumbar facet blocks (#3)  Palliativeintra-articular hip joint injection Palliative LESIs  Recent Visits Date Type Provider Dept  08/26/19 Office Visit Delano Metz, MD [facility]  Showing recent visits within past 90 days and meeting all other requirements   Today's Visits Date Type Provider Dept  11/23/19 Telemedicine Delano Metz, MD [facility]  Showing today's visits and meeting all other requirements   Future Appointments No visits were found meeting these conditions.  Showing future appointments within next 90 days and meeting all other requirements   I discussed the assessment and treatment plan with the patient. The patient was provided an opportunity to ask questions and all were answered. The patient agreed with the plan and demonstrated an understanding of the instructions.  Patient advised to call back or seek an in-person evaluation if the symptoms or condition worsens.  Total duration of non-face-to-face encounter: 18 minutes.  Note by: Mariah Done, MD Date: 11/23/2019; Time: 12:42 PM

## 2019-11-23 ENCOUNTER — Other Ambulatory Visit: Payer: Self-pay

## 2019-11-23 ENCOUNTER — Ambulatory Visit: Payer: Medicare PPO | Attending: Pain Medicine | Admitting: Pain Medicine

## 2019-11-23 DIAGNOSIS — M05742 Rheumatoid arthritis with rheumatoid factor of left hand without organ or systems involvement: Secondary | ICD-10-CM | POA: Diagnosis not present

## 2019-11-23 DIAGNOSIS — M792 Neuralgia and neuritis, unspecified: Secondary | ICD-10-CM

## 2019-11-23 DIAGNOSIS — M7918 Myalgia, other site: Secondary | ICD-10-CM | POA: Diagnosis not present

## 2019-11-23 DIAGNOSIS — M19042 Primary osteoarthritis, left hand: Secondary | ICD-10-CM

## 2019-11-23 DIAGNOSIS — G894 Chronic pain syndrome: Secondary | ICD-10-CM

## 2019-11-23 DIAGNOSIS — G8929 Other chronic pain: Secondary | ICD-10-CM

## 2019-11-23 DIAGNOSIS — M05741 Rheumatoid arthritis with rheumatoid factor of right hand without organ or systems involvement: Secondary | ICD-10-CM | POA: Diagnosis not present

## 2019-11-23 DIAGNOSIS — M19041 Primary osteoarthritis, right hand: Secondary | ICD-10-CM

## 2019-11-23 MED ORDER — HYDROCODONE-ACETAMINOPHEN 10-325 MG PO TABS: 1.0000 | ORAL_TABLET | Freq: Three times a day (TID) | ORAL | 0 refills | Status: DC | PRN

## 2019-11-23 MED ORDER — PREGABALIN 50 MG PO CAPS: 50.0000 mg | ORAL_CAPSULE | Freq: Two times a day (BID) | ORAL | 5 refills | Status: DC

## 2019-11-23 MED ORDER — MELOXICAM 15 MG PO TABS: 15.0000 mg | ORAL_TABLET | Freq: Every day | ORAL | 5 refills | Status: DC

## 2019-11-23 MED ORDER — MAGNESIUM OXIDE 400 (241.3 MG) MG PO TABS: 400.0000 mg | ORAL_TABLET | Freq: Every day | ORAL | 5 refills | Status: DC

## 2019-12-08 ENCOUNTER — Other Ambulatory Visit: Payer: Self-pay | Admitting: Internal Medicine

## 2019-12-08 DIAGNOSIS — R9431 Abnormal electrocardiogram [ECG] [EKG]: Secondary | ICD-10-CM

## 2019-12-08 DIAGNOSIS — R0609 Other forms of dyspnea: Secondary | ICD-10-CM

## 2019-12-15 ENCOUNTER — Other Ambulatory Visit: Payer: Self-pay

## 2019-12-15 ENCOUNTER — Encounter
Admission: RE | Admit: 2019-12-15 | Discharge: 2019-12-15 | Disposition: A | Discharge status: Home / Self Care | Payer: Medicare PPO | Source: Ambulatory Visit | Attending: Internal Medicine | Admitting: Internal Medicine

## 2019-12-15 DIAGNOSIS — R9431 Abnormal electrocardiogram [ECG] [EKG]: Secondary | ICD-10-CM | POA: Insufficient documentation

## 2019-12-15 DIAGNOSIS — R0609 Other forms of dyspnea: Secondary | ICD-10-CM

## 2019-12-15 DIAGNOSIS — R0600 Dyspnea, unspecified: Secondary | ICD-10-CM | POA: Diagnosis present

## 2019-12-15 MED ORDER — REGADENOSON 0.4 MG/5ML IV SOLN
0.4000 mg | Freq: Once | INTRAVENOUS | Status: AC
  Administered 2019-12-15: 0.4 mg via INTRAVENOUS

## 2019-12-15 MED ORDER — TECHNETIUM TC 99M TETROFOSMIN IV KIT
10.0000 | PACK | Freq: Once | INTRAVENOUS | Status: AC | PRN
  Administered 2019-12-15: 10.44 via INTRAVENOUS

## 2019-12-15 MED ORDER — TECHNETIUM TC 99M TETROFOSMIN IV KIT
29.8300 | PACK | Freq: Once | INTRAVENOUS | Status: AC | PRN
  Administered 2019-12-15: 29.83 via INTRAVENOUS

## 2019-12-16 DIAGNOSIS — I5032 Chronic diastolic (congestive) heart failure: Secondary | ICD-10-CM | POA: Insufficient documentation

## 2019-12-16 LAB — NM MYOCAR MULTI W/SPECT W/WALL MOTION / EF
Estimated workload: 1 METS
Exercise duration (min): 1 min
Exercise duration (sec): 13 s
LV dias vol: 29 mL (ref 46–106)
LV sys vol: 13 mL
Peak HR: 95 {beats}/min
Percent HR: 65 %
Rest HR: 81 {beats}/min
SDS: 7
SRS: 5
SSS: 5
TID: 1.29

## 2019-12-22 ENCOUNTER — Telehealth: Payer: Self-pay | Admitting: *Deleted

## 2019-12-22 NOTE — Telephone Encounter (Signed)
I called patient and told her we would submit a PA for Hydrocodone today.

## 2019-12-22 NOTE — Telephone Encounter (Signed)
Attempted to do PA and it says that it was not necessary.  Patient notified.

## 2020-02-18 ENCOUNTER — Encounter: Payer: Self-pay | Admitting: Pain Medicine

## 2020-02-21 NOTE — Progress Notes (Signed)
Patient: Mariah Poole  Service Category: E/M  Provider: Gaspar Cola, MD  DOB: 06/07/1946  DOS: 02/22/2020  Location: Office  MRN: [id number]  Setting: Ambulatory outpatient  Referring Provider: Marguerita Merles, MD  Type: Established Patient  Specialty: Interventional Pain Management  PCP: Marguerita Merles, MD  Location: Remote location  Delivery: TeleHealth     Virtual Encounter - Pain Management PROVIDER NOTE: Information contained herein reflects review and annotations entered in association with encounter. Interpretation of such information and data should be left to medically-trained personnel. Information provided to patient can be located elsewhere in the medical record under "Patient Instructions". Document created using STT-dictation technology, any transcriptional errors that may result from process are unintentional.    Contact & Pharmacy Preferred: [phone] Home: [phone] (home) Mobile: [phone] (mobile) E-mail: [email or web address]  [id number]: [phone]: [phone]  Pre-screening  Mariah Poole offered "in-person" vs "virtual" encounter. She indicated preferring virtual for this encounter.   Reason COVID-19*  Social distancing based on CDC and AMA recommendations.   I contacted [name] on 02/22/2020 via telephone.      I clearly identified myself as FGaspar Cola MD. I verified that I was speaking with the correct person using two identifiers (Name: ESAMIKA Poole and date of birth: [birth year].  Consent I sought verbal advanced consent from EWynona Dovefor virtual visit interactions. I informed Mariah Poole of possible security and privacy concerns, risks, and limitations associated with providing "not-in-person" medical evaluation and management services. I also informed Mariah Poole of the availability of "in-person" appointments. Finally, I  informed her that there would be a charge for the virtual visit and that she could be  personally, fully or partially, financially responsible for it. Ms. TChiquitoexpressed understanding and agreed to proceed.   Historic Elements   Ms. Mariah CERMAKis a 74y.o. year old, female patient evaluated today after her last contact with our practice on 12/22/2019. [name] has a past medical history of Anemia, Arthritis, COPD (chronic obstructive pulmonary disease) ([place], Depression, Diabetes mellitus without complication ([place], GERD (gastroesophageal reflux disease), Hiatal hernia, History of contrast media allergy. (IVP dye) (09/01/2015), History of hiatal hernia, Hypercholesteremia, Hypertension, Morbid obesity ([place], Scoliosis, and Shortness of breath dyspnea. She also  has a past surgical history that includes Abdominal hysterectomy; Cataract extraction w/ intraocular lens implant (Right); Gastric bypass; Hernia repair; Cataract extraction w/PHACO (Left, 07/19/2015); Cholecystectomy; and Hernia repair. Ms. TCrutcherhas a current medication list which includes the following prescription(s): albuterol, atenolol, budesonide-formoterol, vitamin d3, duloxetine, ferosul, furosemide, [START ON 02/23/2020] hydrocodone-acetaminophen, [START ON 03/24/2020] hydrocodone-acetaminophen, [START ON 04/23/2020] hydrocodone-acetaminophen, magnesium oxide, meloxicam, pantoprazole, potassium chloride sa, pravastatin, pregabalin, turmeric curcumin, vitamin c, and symbicort. She  reports that she has never smoked. She has never used smokeless tobacco. She reports current drug use. She reports that she does not drink alcohol. Ms. TFarnamis allergic to contrast media [iodinated diagnostic agents]; ioxaglate; and shellfish allergy.   HPI  Today, she is being contacted for medication management. The patient indicates doing well with the current medication regimen. No adverse reactions or side effects reported to the medications.  Today [facility]  [facility] that [facility] may prefer secondary to the problems that  we have encounter with [facility] not having enough opioid analgesics for the community.  The patient confirmed that she has been one of those that has received partial prescriptions.  Pharmacotherapy Assessment  Analgesic: Hydrocodone/APAP 10/325, 1 tab PO q 8 hrs PRN (30 mg/day of hydrocodone) (975 mg/day of acetaminophen) MME/day:30 mg/day.   Monitoring: [place] PMP: PDMP reviewed during this encounter.       Pharmacotherapy: No side-effects or adverse reactions reported. Compliance: No problems identified. Effectiveness: Clinically acceptable. Plan: Refer to "POC".  UDS:  Summary  Date Value Ref Range Status  06/17/2019 Note  Final    Comment:    ==================================================================== ToxASSURE Select 13 (MW) ==================================================================== Test                             Result       Flag       Units Drug Present and Declared for Prescription Verification   Hydrocodone                    6196         EXPECTED   ng/mg creat   Hydromorphone                  497          EXPECTED   ng/mg creat   Dihydrocodeine                 947          EXPECTED   ng/mg creat   Norhydrocodone                 >4902        EXPECTED   ng/mg creat    Sources of hydrocodone include scheduled prescription medications.    Hydromorphone, dihydrocodeine and norhydrocodone are expected    metabolites of hydrocodone. Hydromorphone and dihydrocodeine are    also available as scheduled prescription medications. Drug Absent but Declared for Prescription Verification   Tramadol                       Not Detected UNEXPECTED ng/mg creat ==================================================================== Test                      Result     Flag   Units      Ref Range   Creatinine              102              mg/dL      >=20 ==================================================================== Declared Medications:  The flagging and interpretation on this report are based on the  following declared medications.  Unexpected results may arise from  inaccuracies in the declared medications.  **Note: The testing scope of this panel includes these medications:  Hydrocodone (Norco)  Tramadol (Ultram)  **Note: The testing scope of this panel does not include the  following reported medications:  Acetaminophen (Norco)  Albuterol  Atenolol (Tenormin)  Duloxetine (Cymbalta)  Furosemide (Lasix)  Magnesium (Mag-Ox)  Meloxicam (Mobic)  Multivitamin  Pantoprazole (Protonix)  Potassium  Pravastatin (Pravachol)  Pregabalin (Lyrica)  Turmeric  Vitamin C  Vitamin D3 ==================================================================== For clinical consultation, please call [phone]. ====================================================================    Laboratory Chemistry Profile   Renal Lab Results  Component Value Date   BUN 24 06/17/2019   CREATININE 1.17 (H) 06/17/2019   BCR 21 06/17/2019  GFRAA 53 (L) 06/17/2019   GFRNONAA 46 (L) 06/17/2019     Hepatic Lab Results  Component Value Date   AST 15 06/17/2019   ALT 9 05/20/2017   ALBUMIN 4.3 06/17/2019   ALKPHOS 101 06/17/2019   LIPASE 19 10/27/2015     Electrolytes Lab Results  Component Value Date   NA 141 06/17/2019   K 3.9 06/17/2019   CL 103 06/17/2019   CALCIUM 9.3 06/17/2019   MG 1.9 06/17/2019     Bone Lab Results  Component Value Date   25OHVITD1 45 06/17/2019   25OHVITD2 <1.0 06/17/2019   25OHVITD3 45 06/17/2019     Inflammation (CRP: Acute Phase) (ESR: Chronic Phase) Lab Results  Component Value Date   CRP 7 06/17/2019   ESRSEDRATE 49 (H) 06/17/2019   LATICACIDVEN 4.0 (H) 11/23/2009       Note: Above Lab results  reviewed.  Imaging  NM Myocar Multi W/Spect W/Wall Motion / EF  Blood pressure demonstrated a normal response to exercise.  There was no ST segment deviation noted during stress.  The study is normal.  This is a low risk study.  The left ventricular ejection fraction is normal (55-65%).    Assessment  The primary encounter diagnosis was Chronic low back pain (Primary area of Pain) (Bilateral) (R>L). 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  Plan of Care  Problem-specific:  No problem-specific Assessment & Plan notes found for this encounter.  Mariah Poole has a current medication list which includes the following long-term medication(s): albuterol, atenolol, budesonide-formoterol, duloxetine, ferosul, furosemide, [START ON 02/23/2020] hydrocodone-acetaminophen, [START ON 03/24/2020] hydrocodone-acetaminophen, [START ON 04/23/2020] hydrocodone-acetaminophen, magnesium oxide, meloxicam, pantoprazole, potassium chloride sa, pravastatin, pregabalin, and symbicort.  Pharmacotherapy (Medications Ordered): Meds ordered this encounter  Medications  . HYDROcodone-acetaminophen (NORCO) 10-325 MG tablet    Sig: Take 1 tablet by mouth every 8 (eight) hours as needed for severe pain. Must last 30 days    Dispense:  90 tablet    Refill:  0    Chronic Pain: STOP Act (Not applicable) Fill 1 day early if closed on refill date. Do not fill until: 02/23/2020. To last until: 03/24/2020. Avoid benzodiazepines within 8 hours of opioids  . HYDROcodone-acetaminophen (NORCO) 10-325 MG tablet    Sig: Take 1 tablet by mouth every 8 (eight) hours as needed for severe pain. Must last 30 days    Dispense:  90 tablet    Refill:  0    Chronic Pain: STOP Act (Not applicable) Fill 1 day early if closed  on refill date. Do not fill until: 03/24/2020. To last until: 04/23/2020. Avoid benzodiazepines within 8 hours of opioids  . HYDROcodone-acetaminophen (NORCO) 10-325 MG tablet    Sig: Take 1 tablet by mouth every 8 (eight) hours as needed for severe pain. Must last 30 days    Dispense:  90 tablet    Refill:  0    Chronic Pain: STOP Act (Not applicable) Fill 1 day early if closed on refill date. Do not fill until: 04/23/2020. To last until: 05/23/2020. Avoid benzodiazepines within 8 hours of opioids   Orders:  No orders of the defined types were placed in this encounter.  Follow-up plan:   Return in about 3 months (around 05/23/2020) for (VV), (MM).      Considering:  Possible bilateral lumbar facet RFA    Palliative PRN treatment(s):   Palliative bilateral lumbar facet blocks (#3)  Palliativeintra-articular hip joint injection Palliative LESIs        Recent Visits No visits were found meeting these conditions.  Showing recent visits within past 90 days and meeting all other requirements   Today's Visits Date Type Provider Dept  02/22/20 Telemedicine Milinda Pointer, MD [facility]  Showing today's visits and meeting all other requirements   Future Appointments No visits were found meeting these conditions.  Showing future appointments within next 90 days and meeting all other requirements   I discussed the assessment and treatment plan with the patient. The patient was provided an opportunity to ask questions and all were answered. The patient agreed with the plan and demonstrated an understanding of the instructions.  Patient advised to call back or seek an in-person evaluation if the symptoms or condition worsens.  Duration of encounter: 15 minutes.  Note by: Gaspar Cola, MD Date: 02/22/2020; Time: 11:31 AM

## 2020-02-22 ENCOUNTER — Other Ambulatory Visit: Payer: Self-pay

## 2020-02-22 ENCOUNTER — Ambulatory Visit: Payer: Medicare PPO | Attending: Pain Medicine | Admitting: Pain Medicine

## 2020-02-22 DIAGNOSIS — M21949 Unspecified acquired deformity of hand, unspecified hand: Secondary | ICD-10-CM | POA: Diagnosis not present

## 2020-02-22 DIAGNOSIS — G8929 Other chronic pain: Secondary | ICD-10-CM

## 2020-02-22 DIAGNOSIS — M21969 Unspecified acquired deformity of unspecified lower leg: Secondary | ICD-10-CM | POA: Diagnosis not present

## 2020-02-22 DIAGNOSIS — M069 Rheumatoid arthritis, unspecified: Secondary | ICD-10-CM

## 2020-02-22 DIAGNOSIS — G894 Chronic pain syndrome: Secondary | ICD-10-CM

## 2020-02-22 DIAGNOSIS — M48062 Spinal stenosis, lumbar region with neurogenic claudication: Secondary | ICD-10-CM

## 2020-02-22 DIAGNOSIS — M545 Low back pain, unspecified: Secondary | ICD-10-CM

## 2020-02-22 MED ORDER — HYDROCODONE-ACETAMINOPHEN 10-325 MG PO TABS: 1.0000 | ORAL_TABLET | Freq: Three times a day (TID) | ORAL | 0 refills | Status: DC | PRN

## 2020-03-18 ENCOUNTER — Other Ambulatory Visit: Payer: Self-pay | Admitting: Pain Medicine

## 2020-03-18 DIAGNOSIS — M19041 Primary osteoarthritis, right hand: Secondary | ICD-10-CM

## 2020-03-18 DIAGNOSIS — M05741 Rheumatoid arthritis with rheumatoid factor of right hand without organ or systems involvement: Secondary | ICD-10-CM

## 2020-04-18 ENCOUNTER — Other Ambulatory Visit: Payer: Self-pay | Admitting: Pain Medicine

## 2020-04-18 DIAGNOSIS — M19042 Primary osteoarthritis, left hand: Secondary | ICD-10-CM

## 2020-04-18 DIAGNOSIS — M05741 Rheumatoid arthritis with rheumatoid factor of right hand without organ or systems involvement: Secondary | ICD-10-CM

## 2020-05-18 ENCOUNTER — Other Ambulatory Visit: Payer: Self-pay | Admitting: Pain Medicine

## 2020-05-18 ENCOUNTER — Encounter: Payer: Self-pay | Admitting: Pain Medicine

## 2020-05-18 DIAGNOSIS — M19042 Primary osteoarthritis, left hand: Secondary | ICD-10-CM

## 2020-05-18 DIAGNOSIS — M05742 Rheumatoid arthritis with rheumatoid factor of left hand without organ or systems involvement: Secondary | ICD-10-CM

## 2020-05-18 NOTE — Progress Notes (Addendum)
Patient: Mariah Poole  Service Category: E/M  Provider: Gaspar Cola, MD  DOB: 11-04-1946  DOS: 05/19/2020  Location: Office  MRN: [id number]  Setting: Ambulatory outpatient  Referring Provider: Marguerita Merles, MD  Type: Established Patient  Specialty: Interventional Pain Management  PCP: Mariah Merles, MD  Location: Remote location  Delivery: TeleHealth     Virtual Encounter - Pain Management PROVIDER NOTE: Information contained herein reflects review and annotations entered in association with encounter. Interpretation of such information and data should be left to medically-trained personnel. Information provided to patient can be located elsewhere in the medical record under "Patient Instructions". Document created using STT-dictation technology, any transcriptional errors that may result from process are unintentional.    Contact & Pharmacy Preferred: [phone] Home: [phone] (home) Mobile: [phone] (mobile) E-mail: [email or web address]  [id number] [place]: [phone]: [phone] [place] [place]Phone: [phone]: [phone]  Pre-screening  Mariah Poole offered "in-person" vs "virtual" encounter. She indicated preferring virtual for this encounter.   Reason COVID-19*  Social distancing based on CDC and AMA recommendations.   I contacted Mariah Poole on 05/19/2020 via telephone.      I clearly identified myself as FGaspar Cola MD. I verified that I was speaking with the correct person using two identifiers (Name: Mariah Poole and date of birth: 6Apr 11, 1947.  Consent I sought verbal advanced consent from Mariah Dovefor virtual visit interactions. I informed Mariah Poole of possible security and privacy concerns, risks, and  limitations associated with providing "not-in-person" medical evaluation and management services. I also informed Mariah Poole of the availability of "in-person" appointments. Finally, I informed her that there would be a charge for the virtual visit and that she could be  personally, fully or partially, financially responsible for it. Ms. TBullerexpressed understanding and agreed to proceed.   Historic Elements   Ms. Mariah COSTEis a 74y.o. year old, female patient evaluated today after her last contact with our practice on 05/18/2020. [name] has a past medical history of Anemia, Arthritis, COPD (chronic obstructive pulmonary disease) ([place], Depression, Diabetes mellitus without complication ([place], GERD (gastroesophageal reflux disease), Hiatal hernia, History of contrast media allergy. (IVP dye) (09/01/2015), History of hiatal hernia, Hypercholesteremia, Hypertension, Morbid obesity ([place], Scoliosis, and Shortness of breath dyspnea. She also  has a past surgical history that includes Abdominal hysterectomy; Cataract extraction w/ intraocular lens implant (Right); Gastric bypass; Hernia repair; Cataract extraction w/PHACO (Left, 07/19/2015); Cholecystectomy; and Hernia repair. Ms. TBowditchhas a current medication list which includes the following prescription(s): albuterol, atenolol, budesonide-formoterol, vitamin d3, duloxetine, ferosul, furosemide, [START ON 05/23/2020] hydrocodone-acetaminophen, [START ON 06/22/2020] hydrocodone-acetaminophen, [START ON 07/22/2020] hydrocodone-acetaminophen, losartan, [START ON 06/01/2020] magnesium oxide, [START ON 06/01/2020] meloxicam, pantoprazole, potassium chloride sa, pravastatin, [START ON 06/01/2020] pregabalin, spironolactone, symbicort, turmeric curcumin, and vitamin c. She  reports that she has never smoked. She has never used smokeless tobacco. She reports current drug use. She reports that she does not drink alcohol. Ms. TWarbingtonis allergic to contrast media  [iodinated diagnostic agents], ioxaglate, and shellfish allergy.   HPI  Today, she is being contacted for medication management. The patient indicates doing well with the current medication regimen. No adverse reactions or side effects reported to  the medications.   Pharmacotherapy Assessment  Analgesic: Hydrocodone/APAP 10/325, 1 tab PO q 8 hrs PRN (30 mg/day of hydrocodone) (975 mg/day of acetaminophen) MME/day:30 mg/day.   Monitoring: [place] PMP: PDMP reviewed during this encounter.       Pharmacotherapy: No side-effects or adverse reactions reported. Compliance: No problems identified. Effectiveness: Clinically acceptable. Plan: Refer to "POC".  UDS:  Summary  Date Value Ref Range Status  06/17/2019 Note  Final    Comment:    ==================================================================== ToxASSURE Select 13 (MW) ==================================================================== Test                             Result       Flag       Units Drug Present and Declared for Prescription Verification   Hydrocodone                    6196         EXPECTED   ng/mg creat   Hydromorphone                  497          EXPECTED   ng/mg creat   Dihydrocodeine                 947          EXPECTED   ng/mg creat   Norhydrocodone                 >4902        EXPECTED   ng/mg creat    Sources of hydrocodone include scheduled prescription medications.    Hydromorphone, dihydrocodeine and norhydrocodone are expected    metabolites of hydrocodone. Hydromorphone and dihydrocodeine are    also available as scheduled prescription medications. Drug Absent but Declared for Prescription Verification   Tramadol                       Not Detected UNEXPECTED ng/mg creat ==================================================================== Test                      Result    Flag   Units      Ref Range   Creatinine              102              mg/dL       >=20 ==================================================================== Declared Medications:  The flagging and interpretation on this report are based on the  following declared medications.  Unexpected results may arise from  inaccuracies in the declared medications.  **Note: The testing scope of this panel includes these medications:  Hydrocodone (Norco)  Tramadol (Ultram)  **Note: The testing scope of this panel does not include the  following reported medications:  Acetaminophen (Norco)  Albuterol  Atenolol (Tenormin)  Duloxetine (Cymbalta)  Furosemide (Lasix)  Magnesium (Mag-Ox)  Meloxicam (Mobic)  Multivitamin  Pantoprazole (Protonix)  Potassium  Pravastatin (Pravachol)  Pregabalin (Lyrica)  Turmeric  Vitamin C  Vitamin D3 ==================================================================== For clinical consultation, please call [phone]. ====================================================================     Laboratory Chemistry Profile   Renal Lab Results  Component Value Date   BUN 24 06/17/2019   CREATININE 1.17 (H) 06/17/2019   BCR 21 06/17/2019   GFRAA 53 (L) 06/17/2019   GFRNONAA 46 (L) 06/17/2019     Hepatic Lab Results  Component Value Date   AST 15  06/17/2019   ALT 9 05/20/2017   ALBUMIN 4.3 06/17/2019   ALKPHOS 101 06/17/2019   LIPASE 19 10/27/2015     Electrolytes Lab Results  Component Value Date   NA 141 06/17/2019   K 3.9 06/17/2019   CL 103 06/17/2019   CALCIUM 9.3 06/17/2019   MG 1.9 06/17/2019     Bone Lab Results  Component Value Date   25OHVITD1 45 06/17/2019   25OHVITD2 <1.0 06/17/2019   25OHVITD3 45 06/17/2019     Inflammation (CRP: Acute Phase) (ESR: Chronic Phase) Lab Results  Component Value Date   CRP 7 06/17/2019   ESRSEDRATE 49 (H) 06/17/2019   LATICACIDVEN 4.0 (H) 11/23/2009       Note: Above Lab results reviewed.   Imaging  NM Myocar Multi W/Spect W/Wall Motion / EF  Blood pressure  demonstrated a normal response to exercise.  There was no ST segment deviation noted during stress.  The study is normal.  This is a low risk study.  The left ventricular ejection fraction is normal (55-65%).    Assessment  The primary encounter diagnosis was Chronic pain syndrome. Diagnoses of Chronic low back pain (Primary area of Pain) (Bilateral) (R>L), Pharmacologic therapy, Chronic musculoskeletal pain, Neurogenic pain, Rheumatoid arthritis involving both hands with positive rheumatoid factor ([place]), and Primary osteoarthritis of both hands were also pertinent to this visit.  Plan of Care  Problem-specific:  No problem-specific Assessment & Plan notes found for this encounter.  Ms. DAJUANA PALEN has a current medication list which includes the following long-term medication(s): albuterol, atenolol, budesonide-formoterol, duloxetine, ferosul, furosemide, [START ON 05/23/2020] hydrocodone-acetaminophen, [START ON 06/22/2020] hydrocodone-acetaminophen, [START ON 07/22/2020] hydrocodone-acetaminophen, [START ON 06/01/2020] magnesium oxide, [START ON 06/01/2020] meloxicam, pantoprazole, potassium chloride sa, pravastatin, [START ON 06/01/2020] pregabalin, spironolactone, and symbicort.  Pharmacotherapy (Medications Ordered): Meds ordered this encounter  Medications  . magnesium oxide (MAG-OX) 400 (241.3 Mg) MG tablet    Sig: Take 1 tablet (400 mg total) by mouth daily.    Dispense:  30 tablet    Refill:  5    Fill one day early if pharmacy is closed on scheduled refill date. May substitute for generic if available.  . pregabalin (LYRICA) 50 MG capsule    Sig: Take 1 capsule (50 mg total) by mouth 2 (two) times daily.    Dispense:  60 capsule    Refill:  5    Fill one day early if pharmacy is closed on scheduled refill date. May substitute for generic if available.  . meloxicam (MOBIC) 15 MG tablet    Sig: Take 1 tablet (15 mg total) by mouth daily.    Dispense:  30 tablet    Refill:  5     Fill one day early if pharmacy is closed on scheduled refill date. May substitute for generic if available. Do not send automatic refill requests. Evaluation appointments needed before renewing scripts.  Marland Kitchen HYDROcodone-acetaminophen (NORCO) 10-325 MG tablet    Sig: Take 1 tablet by mouth every 8 (eight) hours as needed for severe pain. Must last 30 days    Dispense:  90 tablet    Refill:  0    Chronic Pain: STOP Act (Not applicable) Fill 1 day early if closed on refill date. Do not fill until: 05/23/2020. To last until: 06/22/2020. Avoid benzodiazepines within 8 hours of opioids  . HYDROcodone-acetaminophen (NORCO) 10-325 MG tablet    Sig: Take 1 tablet by mouth every 8 (eight) hours as needed for severe pain. Must  last 30 days    Dispense:  90 tablet    Refill:  0    Chronic Pain: STOP Act (Not applicable) Fill 1 day early if closed on refill date. Do not fill until: 06/22/2020. To last until: 07/22/2020. Avoid benzodiazepines within 8 hours of opioids  . HYDROcodone-acetaminophen (NORCO) 10-325 MG tablet    Sig: Take 1 tablet by mouth every 8 (eight) hours as needed for severe pain. Must last 30 days    Dispense:  90 tablet    Refill:  0    Chronic Pain: STOP Act (Not applicable) Fill 1 day early if closed on refill date. Do not fill until: 07/22/2020. To last until: 08/21/2020. Avoid benzodiazepines within 8 hours of opioids   Orders:  Orders Placed This Encounter  Procedures  . ToxASSURE Select 13 (MW), Urine    Volume: 30 ml(s). Minimum 3 ml of urine is needed. Document temperature of fresh sample. Indications: Long term (current) use of opiate analgesic [phone])    Order Specific Question:   Release to patient    Answer:   Immediate   Follow-up plan:   Return in about 3 months (around 08/17/2020) for F2F(20-min), MM (never on procedure day).      Considering:   Possible bilateral lumbar facet RFA    Palliative PRN treatment(s):   Palliative bilateral lumbar facet blocks (#3)   Palliativeintra-articular hip joint injection Palliative LESIs         Recent Visits Date Type Provider Dept  02/22/20 Telemedicine Milinda Pointer, MD [facility]  Showing recent visits within past 90 days and meeting all other requirements Today's Visits Date Type Provider Dept  05/19/20 Telemedicine Milinda Pointer, MD [facility]  Showing today's visits and meeting all other requirements Future Appointments No visits were found meeting these conditions. Showing future appointments within next 90 days and meeting all other requirements  I discussed the assessment and treatment plan with the patient. The patient was provided an opportunity to ask questions and all were answered. The patient agreed with the plan and demonstrated an understanding of the instructions.  Patient advised to call back or seek an in-person evaluation if the symptoms or condition worsens.  Duration of encounter: 15 minutes.  Note by: Mariah Cola, MD Date: 05/19/2020; Time: 3:58 PM

## 2020-05-19 ENCOUNTER — Other Ambulatory Visit: Payer: Self-pay

## 2020-05-19 ENCOUNTER — Ambulatory Visit: Payer: Medicare PPO | Attending: Pain Medicine | Admitting: Pain Medicine

## 2020-05-19 DIAGNOSIS — Z79899 Other long term (current) drug therapy: Secondary | ICD-10-CM | POA: Diagnosis not present

## 2020-05-19 DIAGNOSIS — M19041 Primary osteoarthritis, right hand: Secondary | ICD-10-CM

## 2020-05-19 DIAGNOSIS — G894 Chronic pain syndrome: Secondary | ICD-10-CM

## 2020-05-19 DIAGNOSIS — M792 Neuralgia and neuritis, unspecified: Secondary | ICD-10-CM

## 2020-05-19 DIAGNOSIS — M545 Low back pain: Secondary | ICD-10-CM | POA: Diagnosis not present

## 2020-05-19 DIAGNOSIS — G8929 Other chronic pain: Secondary | ICD-10-CM

## 2020-05-19 DIAGNOSIS — M7918 Myalgia, other site: Secondary | ICD-10-CM | POA: Diagnosis not present

## 2020-05-19 DIAGNOSIS — M19042 Primary osteoarthritis, left hand: Secondary | ICD-10-CM

## 2020-05-19 DIAGNOSIS — M05741 Rheumatoid arthritis with rheumatoid factor of right hand without organ or systems involvement: Secondary | ICD-10-CM

## 2020-05-19 DIAGNOSIS — M05742 Rheumatoid arthritis with rheumatoid factor of left hand without organ or systems involvement: Secondary | ICD-10-CM

## 2020-05-19 MED ORDER — PREGABALIN 50 MG PO CAPS: 50.0000 mg | ORAL_CAPSULE | Freq: Two times a day (BID) | ORAL | 5 refills | Status: DC

## 2020-05-19 MED ORDER — HYDROCODONE-ACETAMINOPHEN 10-325 MG PO TABS: 1.0000 | ORAL_TABLET | Freq: Three times a day (TID) | ORAL | 0 refills | Status: DC | PRN

## 2020-05-19 MED ORDER — MAGNESIUM OXIDE 400 (241.3 MG) MG PO TABS: 400.0000 mg | ORAL_TABLET | Freq: Every day | ORAL | 5 refills | Status: DC

## 2020-05-19 MED ORDER — MELOXICAM 15 MG PO TABS: 15.0000 mg | ORAL_TABLET | Freq: Every day | ORAL | 5 refills | Status: DC

## 2020-05-27 LAB — TOXASSURE SELECT 13 (MW), URINE

## 2020-07-06 ENCOUNTER — Telehealth: Payer: Self-pay

## 2020-07-06 NOTE — Telephone Encounter (Signed)
Thanks for letting us know. Will document this in her chart.

## 2020-07-06 NOTE — Telephone Encounter (Signed)
She is covid positive, she is taking extra medicine due to the pain. She is going to be out early. She knows she cant get any more but she wanted [place] to know. The vicodin makes her feel better.

## 2020-08-03 ENCOUNTER — Encounter: Payer: Self-pay | Admitting: Pain Medicine

## 2020-08-04 ENCOUNTER — Other Ambulatory Visit: Payer: Self-pay

## 2020-08-04 ENCOUNTER — Ambulatory Visit: Payer: Medicare PPO | Attending: Pain Medicine | Admitting: Pain Medicine

## 2020-08-04 DIAGNOSIS — G894 Chronic pain syndrome: Secondary | ICD-10-CM | POA: Diagnosis not present

## 2020-08-04 DIAGNOSIS — M545 Low back pain, unspecified: Secondary | ICD-10-CM

## 2020-08-04 DIAGNOSIS — G8929 Other chronic pain: Secondary | ICD-10-CM | POA: Diagnosis not present

## 2020-08-04 DIAGNOSIS — Z79899 Other long term (current) drug therapy: Secondary | ICD-10-CM | POA: Diagnosis not present

## 2020-08-04 MED ORDER — HYDROCODONE-ACETAMINOPHEN 10-325 MG PO TABS: 1.0000 | ORAL_TABLET | Freq: Three times a day (TID) | ORAL | 0 refills | Status: DC | PRN

## 2020-08-04 NOTE — Progress Notes (Signed)
Patient: Mariah Poole  Service Category: E/M  Provider: Gaspar Cola, MD  DOB: [birth year]  DOS: 08/04/2020  Location: Office  MRN: [id number]  Setting: Ambulatory outpatient  Referring Provider: Marguerita Merles, MD  Type: Established Patient  Specialty: Interventional Pain Management  PCP: Mariah Merles, MD  Location: Remote location  Delivery: TeleHealth     Virtual Encounter - Pain Management PROVIDER NOTE: Information contained herein reflects review and annotations entered in association with encounter. Interpretation of such information and data should be left to medically-trained personnel. Information provided to patient can be located elsewhere in the medical record under "Patient Instructions". Document created using STT-dictation technology, any transcriptional errors that may result from process are unintentional.    Contact & Pharmacy Preferred: [phone] Home: [phone] (home) Mobile: [phone] (mobile) E-mail: [email or web address]  [id number] [id number]: [phone]: [phone] [address]Phone: [phone]: [phone]  Pre-screening  Mariah Poole offered "in-person" vs "virtual" encounter. She indicated preferring virtual for this encounter.   Reason COVID-19*  Social distancing based on CDC and AMA recommendations.   I contacted Mariah Poole on 08/04/2020 via telephone.      I clearly identified myself as FGaspar Cola MD. I verified that I was speaking with the correct person using two identifiers (Name: Mariah Poole and date of birth: [id number].  Consent I sought verbal advanced consent from Mariah Dovefor virtual visit interactions. I informed Mariah Poole of possible security and privacy concerns, risks, and  limitations associated with providing "not-in-person" medical evaluation and management services. I also informed Mariah Poole of the availability of "in-person" appointments. Finally, I informed her that there would be a charge for the virtual visit and that she could be  personally, fully or partially, financially responsible for it. Ms. TWinslowexpressed understanding and agreed to proceed.   Historic Elements   Mariah Poole a 74y.o. year old, female patient evaluated today after our last contact on 05/18/2020. Mariah Poole has a past medical history of Anemia, Arthritis, COPD (chronic obstructive pulmonary disease) ([place], Depression, Diabetes mellitus without complication ([place], GERD (gastroesophageal reflux disease), Hiatal hernia, History of contrast media allergy. (IVP dye) (09/01/2015), History of hiatal hernia, Hypercholesteremia, Hypertension, Morbid obesity ([place], Scoliosis, and Shortness of breath dyspnea. She also  has a past surgical history that includes Abdominal hysterectomy; Cataract extraction w/ intraocular lens implant (Right); Gastric bypass; Hernia repair; Cataract extraction w/PHACO (Left, 07/19/2015); Cholecystectomy; and Hernia repair. Ms. TVasselhas a current medication list which includes the following prescription(s): albuterol, atenolol, budesonide-formoterol, vitamin d3, duloxetine, ferosul, furosemide, [START ON 08/21/2020] hydrocodone-acetaminophen, [START ON 09/20/2020] hydrocodone-acetaminophen, [START ON 10/20/2020] hydrocodone-acetaminophen, losartan, magnesium oxide, meloxicam, pantoprazole, potassium chloride sa, pravastatin, pregabalin, spironolactone, symbicort, turmeric curcumin, and vitamin c. She  reports that she has never smoked. She has never used smokeless tobacco. She reports current drug use. She reports that she does not drink alcohol. Ms. TBradwayis allergic to contrast media [iodinated diagnostic agents], ioxaglate, and shellfish allergy.   HPI  Today,  she is being contacted for medication management. The patient indicates doing well with the current medication regimen. No adverse reactions or side effects reported to the medications.   Transfer: Magnesium oxide (Mag-Ox) 400 mg tablet, 1  tablet p.o. daily (30/month) (11/28/2020); pregabalin (Lyrica) 50 mg capsule, 1 capsule p.o. twice daily (60/month) (11/28/2020); meloxicam (Mobic) 15 mg tablet, 1 tablet p.o. daily (30/month) (11/28/2020).  Pharmacotherapy Assessment  Analgesic: Hydrocodone/APAP 10/325, 1 tab PO q 8 hrs PRN (30 mg/day of hydrocodone) (975 mg/day of acetaminophen) MME/day:30 mg/day.   Monitoring: [place] PMP: PDMP reviewed during this encounter.       Pharmacotherapy: No side-effects or adverse reactions reported. Compliance: No problems identified. Effectiveness: Clinically acceptable. Plan: Refer to "POC".  UDS:  Summary  Date Value Ref Range Status  05/25/2020 Note  Final    Comment:    ==================================================================== [employer] Select 13 (MW) ==================================================================== Test                             Result       Flag       Units  Drug Present and Declared for Prescription Verification   Hydrocodone                    2868         EXPECTED   ng/mg creat   Hydromorphone                  300          EXPECTED   ng/mg creat   Dihydrocodeine                 623          EXPECTED   ng/mg creat   Norhydrocodone                 2459         EXPECTED   ng/mg creat    Sources of hydrocodone include scheduled prescription medications.    Hydromorphone, dihydrocodeine and norhydrocodone are expected    metabolites of hydrocodone. Hydromorphone and dihydrocodeine are    also available as scheduled prescription medications.  ==================================================================== Test                      Result    Flag   Units      Ref Range   Creatinine              22                mg/dL      >=20 ==================================================================== Declared Medications:  The flagging and interpretation on this report are based on the  following declared medications.  Unexpected results may arise from  inaccuracies in the declared medications.   **Note: The testing scope of this panel includes these medications:   Hydrocodone (Norco)   **Note: The testing scope of this panel does not include the  following reported medications:   Acetaminophen (Norco)  Albuterol (Ventolin HFA)  Atenolol (Tenormin)  Budesonide (Symbicort)  Duloxetine (Cymbalta)  Formoterol (Symbicort)  Furosemide (Lasix)  Iron  Losartan (Cozaar)  Magnesium (Mag-Ox)  Meloxicam (Mobic)  Pantoprazole (Protonix)  Potassium (Klor-Con)  Pravastatin (Pravachol)  Pregabalin (Lyrica)  Spironolactone (Aldactone)  Turmeric  Vitamin C  Vitamin D3 ==================================================================== For clinical consultation, please call [phone]. ====================================================================     Laboratory Chemistry Profile   Renal Lab Results  Component Value Date   BUN 24 06/17/2019   CREATININE 1.17 (H) 06/17/2019   BCR 21 06/17/2019   GFRAA 53 (L) 06/17/2019   GFRNONAA 46 (L) 06/17/2019     Hepatic Lab  Results  Component Value Date   AST 15 06/17/2019   ALT 9 05/20/2017   ALBUMIN 4.3 06/17/2019   ALKPHOS 101 06/17/2019   LIPASE 19 10/27/2015     Electrolytes Lab Results  Component Value Date   NA 141 06/17/2019   K 3.9 06/17/2019   CL 103 06/17/2019   CALCIUM 9.3 06/17/2019   MG 1.9 06/17/2019     Bone Lab Results  Component Value Date   25OHVITD1 45 06/17/2019   25OHVITD2 <1.0 06/17/2019   25OHVITD3 45 06/17/2019     Inflammation (CRP: Acute Phase) (ESR: Chronic Phase) Lab Results  Component Value Date   CRP 7 06/17/2019   ESRSEDRATE 49 (H) 06/17/2019   LATICACIDVEN 4.0 (H) 11/23/2009        Note: Above Lab results reviewed.  Imaging  NM Myocar Multi W/Spect W/Wall Motion / EF  Blood pressure demonstrated a normal response to exercise.  There was no ST segment deviation noted during stress.  The study is normal.  This is a low risk study.  The left ventricular ejection fraction is normal (55-65%).    Assessment  The primary encounter diagnosis was Chronic pain syndrome. Diagnoses of Chronic low back pain (Primary area of Pain) (Bilateral) (R>L) and Pharmacologic therapy were also pertinent to this visit.  Plan of Care  Problem-specific:  No problem-specific Assessment & Plan notes found for this encounter.  Ms. ARYAH DOERING has a current medication list which includes the following long-term medication(s): albuterol, atenolol, budesonide-formoterol, duloxetine, ferosul, furosemide, [START ON 08/21/2020] hydrocodone-acetaminophen, [START ON 09/20/2020] hydrocodone-acetaminophen, [START ON 10/20/2020] hydrocodone-acetaminophen, magnesium oxide, meloxicam, pantoprazole, potassium chloride sa, pravastatin, pregabalin, spironolactone, and symbicort.  Pharmacotherapy (Medications Ordered): Meds ordered this encounter  Medications  . HYDROcodone-acetaminophen (NORCO) 10-325 MG tablet    Sig: Take 1 tablet by mouth every 8 (eight) hours as needed for severe pain. Must last 30 days    Dispense:  90 tablet    Refill:  0    Chronic Pain: STOP Act (Not applicable) Fill 1 day early if closed on refill date. Avoid benzodiazepines within 8 hours of opioids  . HYDROcodone-acetaminophen (NORCO) 10-325 MG tablet    Sig: Take 1 tablet by mouth every 8 (eight) hours as needed for severe pain. Must last 30 days    Dispense:  90 tablet    Refill:  0    Chronic Pain: STOP Act (Not applicable) Fill 1 day early if closed on refill date. Avoid benzodiazepines within 8 hours of opioids  . HYDROcodone-acetaminophen (NORCO) 10-325 MG tablet    Sig: Take 1 tablet by mouth every 8 (eight) hours  as needed for severe pain. Must last 30 days    Dispense:  90 tablet    Refill:  0    Chronic Pain: STOP Act (Not applicable) Fill 1 day early if closed on refill date. Avoid benzodiazepines within 8 hours of opioids   Orders:  No orders of the defined types were placed in this encounter.  Follow-up plan:   Return in about 4 months (around 11/19/2020) for (F2F), (Med Mgmt).      Considering:   Possible bilateral lumbar facet RFA    Palliative PRN treatment(s):   Palliative bilateral lumbar facet blocks (#3)  Palliativeintra-articular hip joint injection Palliative LESIs          Recent Visits Date Type Provider Dept  05/19/20 Telemedicine Milinda Pointer, MD [facility]  Showing recent visits within past 90 days and meeting all other requirements  Today's Visits Date Type Provider Dept  08/04/20 Telemedicine Milinda Pointer, MD [facility]  Showing today's visits and meeting all other requirements Future Appointments No visits were found meeting these conditions. Showing future appointments within next 90 days and meeting all other requirements  I discussed the assessment and treatment plan with the patient. The patient was provided an opportunity to ask questions and all were answered. The patient agreed with the plan and demonstrated an understanding of the instructions.  Patient advised to call back or seek an in-person evaluation if the symptoms or condition worsens.  Duration of encounter: 12 minutes.  Note by: Mariah Cola, MD Date: 08/04/2020; Time: 10:20 AM

## 2020-08-15 ENCOUNTER — Encounter: Payer: Medicare PPO | Admitting: Pain Medicine

## 2020-09-19 ENCOUNTER — Other Ambulatory Visit: Payer: Self-pay | Admitting: Pain Medicine

## 2020-09-19 DIAGNOSIS — M7918 Myalgia, other site: Secondary | ICD-10-CM

## 2020-11-06 NOTE — Progress Notes (Signed)
PROVIDER NOTE: Information contained herein reflects review and annotations entered in association with encounter. Interpretation of such information and data should be left to medically-trained personnel. Information provided to patient can be located elsewhere in the medical record under "Patient Instructions". Document created using STT-dictation technology, any transcriptional errors that may result from process are unintentional.    Patient: Mariah Poole  Service Category: E/M  Provider: Gaspar Cola, MD  DOB: 1946/06/26  DOS: 11/09/2020  Specialty: Interventional Pain Management  MRN: [id number]  Setting: Ambulatory outpatient  PCP: Marguerita Merles, MD  Type: Established Patient    Referring Provider: Marguerita Merles, MD  Location: Office  Delivery: Face-to-face     HPI  Mariah Poole, a 74 y.o. year old female, is here today because of her Chronic pain syndrome [G89.4]. Mariah Poole primary complain today is Back Pain Last encounter: My last encounter with her was on 09/19/2020. 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, L4-5, L5-S1) w/ neurogenic claudication; Lumbar foraminal stenosis (Multilevel) (Bilateral); Cervicalgia; DDD (degenerative disc disease), cervical; Cervical facet hypertrophy; Cervical facet joint syndrome; Lumbar facet hypertrophy; 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 Pain Assessment: Severity of Chronic pain is reported as a 5 /10. Location: Back Right,Left,Lower/pain radiaites down both leg to her ankle. Onset: More than a month ago. Quality: Aching,Burning,Throbbing. Timing: Constant. Modifying factor(s): meds. Vitals:  height is 5' (1.524 m) and weight is 190 lb (86.2 kg). Her temperature is 97.2 F (36.2 C) (abnormal). Her blood pressure is 161/91 (abnormal) and her pulse is 93. Her oxygen saturation is 97%.   Reason for encounter: medication management.  The patient indicates doing well with the current medication regimen. No adverse reactions or side effects reported to the medications. PMP & UDS compliant.   Had Covid in August 2021.   RTCB: 02/17/2021 Nonopioids transferred 11/09/2020: Magnesium, Lyrica, and Mobic.  (Enough to last until 02/07/2021)  Pharmacotherapy Assessment   Analgesic: Hydrocodone/APAP 10/325, 1 tab PO q 8 hrs PRN (30 mg/day of hydrocodone) (975 mg/day of acetaminophen) MME/day:30 mg/day.   Monitoring: [place] PMP: PDMP reviewed during this encounter.       Pharmacotherapy: No side-effects or adverse reactions reported. Compliance: No problems identified. Effectiveness: Clinically acceptable.  Chauncey Fischer, RN  11/09/2020  9:50 AM  Sign when Signing Visit Nursing Pain Medication Assessment:  Safety precautions to be maintained throughout the outpatient stay will include: orient to surroundings, keep bed in low position, maintain call bell within reach at all times, provide assistance with transfer out of bed and ambulation.  Nursing Pain Medication Assessment:  Safety precautions to be maintained throughout the outpatient stay will include: orient to surroundings, keep bed in low position, maintain call bell within reach at all times, provide assistance with transfer out of bed and ambulation.  Medication  Inspection Compliance: Pill count conducted under aseptic conditions, in front of the patient. Neither the pills nor the bottle  was removed from the patient's sight at any time. Once count was completed pills were immediately returned to the patient in their original bottle.  Medication: Hydrocodone/APAP Pill/Patch Count: 36 of 90 pills remain Pill/Patch Appearance: Markings consistent with prescribed medication Bottle Appearance: Standard pharmacy container. Clearly labeled. Filled Date: 110 / 2 / 21 Last Medication intake:  Today     UDS:  Summary  Date Value Ref Range Status  05/25/2020 Note  Final    Comment:    ==================================================================== [employer] (MW) ==================================================================== Test                             Result       Flag       Units  Drug Present and Declared for Prescription Verification   Hydrocodone                    2868         EXPECTED   ng/mg creat   Hydromorphone                  300          EXPECTED   ng/mg creat   Dihydrocodeine                 623          EXPECTED   ng/mg creat   Norhydrocodone                 2459         EXPECTED   ng/mg creat    Sources of hydrocodone include scheduled prescription medications.    Hydromorphone, dihydrocodeine and norhydrocodone are expected    metabolites of hydrocodone. Hydromorphone and dihydrocodeine are    also available as scheduled prescription medications.  ==================================================================== Test                      Result    Flag   Units      Ref Range   Creatinine              22               mg/dL      >=20 ==================================================================== Declared Medications:  The flagging and interpretation on this report are based on the  following declared medications.  Unexpected results may arise from  inaccuracies in the declared medications.    **Note: The testing scope of this panel includes these medications:   Hydrocodone (Norco)   **Note: The testing scope of this panel does not include the  following reported medications:   Acetaminophen (Norco)  Albuterol (Ventolin HFA)  Atenolol (Tenormin)  Budesonide (Symbicort)  Duloxetine (Cymbalta)  Formoterol (Symbicort)  Furosemide (Lasix)  Iron  Losartan (Cozaar)  Magnesium (Mag-Ox)  Meloxicam (Mobic)  Pantoprazole (Protonix)  Potassium (Klor-Con)  Pravastatin (Pravachol)  Pregabalin (Lyrica)  Spironolactone (Aldactone)  Turmeric  Vitamin C  Vitamin D3 ==================================================================== For clinical consultation, please call [phone]. ====================================================================      ROS  Constitutional: Denies any fever or chills Gastrointestinal: No reported hemesis, hematochezia, vomiting, or acute GI distress Musculoskeletal: Denies any acute onset joint swelling, redness, loss of ROM, or weakness Neurological: No reported episodes of acute onset apraxia, aphasia, dysarthria, agnosia, amnesia, paralysis, loss of coordination, or loss of consciousness  Medication Review  DULoxetine, HYDROcodone-acetaminophen, Turmeric Curcumin, Vitamin D3, albuterol, atenolol, budesonide-formoterol, ferrous sulfate, furosemide, losartan,  magnesium oxide, meloxicam, pantoprazole, potassium chloride SA, pravastatin, pregabalin, spironolactone, and vitamin C  History Review  Allergy: Mariah Poole is allergic to contrast media [iodinated diagnostic agents], ioxaglate, and shellfish allergy. Drug: Mariah Poole  reports current drug use. Alcohol:  reports no history of alcohol use. Tobacco:  reports that she has never smoked. She has never used smokeless tobacco. Social: Mariah Poole  reports that she has never smoked. She has never used smokeless tobacco. She reports current drug use. She reports that she does not drink  alcohol. Medical:  has a past medical history of Anemia, Arthritis, COPD (chronic obstructive pulmonary disease) ([place]), Depression, Diabetes mellitus without complication ([place]), GERD (gastroesophageal reflux disease), Hiatal hernia, History of contrast media allergy. (IVP dye) (09/01/2015), History of hiatal hernia, Hypercholesteremia, Hypertension, Morbid obesity ([place]), Scoliosis, and Shortness of breath dyspnea. Surgical: Mariah Poole  has a past surgical history that includes Abdominal hysterectomy; Cataract extraction w/ intraocular lens implant (Right); Gastric bypass; Hernia repair; Cataract extraction w/PHACO (Left, 07/19/2015); Cholecystectomy; and Hernia repair. Family: family history includes Diabetes in her mother; Heart disease in her father.  Laboratory Chemistry Profile   Renal Lab Results  Component Value Date   BUN 24 06/17/2019   CREATININE 1.17 (H) 06/17/2019   BCR 21 06/17/2019   GFRAA 53 (L) 06/17/2019   GFRNONAA 46 (L) 06/17/2019     Hepatic Lab Results  Component Value Date   AST 15 06/17/2019   ALT 9 05/20/2017   ALBUMIN 4.3 06/17/2019   ALKPHOS 101 06/17/2019   LIPASE 19 10/27/2015     Electrolytes Lab Results  Component Value Date   NA 141 06/17/2019   K 3.9 06/17/2019   CL 103 06/17/2019   CALCIUM 9.3 06/17/2019   MG 1.9 06/17/2019     Bone Lab Results  Component Value Date   25OHVITD1 45 06/17/2019   25OHVITD2 <1.0 06/17/2019   25OHVITD3 45 06/17/2019     Inflammation (CRP: Acute Phase) (ESR: Chronic Phase) Lab Results  Component Value Date   CRP 7 06/17/2019   ESRSEDRATE 49 (H) 06/17/2019   LATICACIDVEN 4.0 (H) 11/23/2009       Note: Above Lab results reviewed.  Recent Imaging Review  NM Myocar Multi W/Spect W/Wall Motion / EF  Blood pressure demonstrated a normal response to exercise.  There was no ST segment deviation noted during stress.  The study is normal.  This is a low risk study.  The left ventricular ejection fraction  is normal (55-65%).   Note: Reviewed        Physical Exam  General appearance: Well nourished, well developed, and well hydrated. In no apparent acute distress Mental status: Alert, oriented x 3 (person, place, & time)       Respiratory: No evidence of acute respiratory distress Eyes: PERLA Vitals: BP (!) 161/91   Pulse 93   Temp (!) 97.2 F (36.2 C)   Ht 5' (1.524 m)   Wt 190 lb (86.2 kg)   SpO2 97%   BMI 37.11 kg/m  BMI: Estimated body mass index is 37.11 kg/m as calculated from the following:   Height as of this encounter: 5' (1.524 m).   Weight as of this encounter: 190 lb (86.2 kg). Ideal: Ideal body weight: 45.5 kg (100 lb 4.9 oz) Adjusted ideal body weight: 61.8 kg (136 lb 3 oz)  Assessment   Status Diagnosis  Controlled Controlled Controlled 1. Chronic pain syndrome   2. Chronic low back pain (Primary area of Pain) (Bilateral) (R>L)  3. Rheumatoid arthritis involving both hands with positive rheumatoid factor (HCC)   4. Chronic hip pain (Right)   5. Pharmacologic therapy   6. 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 Symptoms improving w/ spironolactone Increase spironolactone to 25 mg daily, repeat labs Will consider sleep study in future   Extravasation Injury   Formatting of this note might be different from the original. 82m [employer] lot [id number]     Plan of Care  Problem-specific:  No problem-specific Assessment & Plan notes found for this encounter.  Ms. EBERT GIVANShas a current medication list which includes the following long-term medication(s): albuterol, atenolol, budesonide-formoterol, duloxetine, ferosul, furosemide, hydrocodone-acetaminophen, [START ON 11/19/2020] hydrocodone-acetaminophen, [START ON 12/19/2020] hydrocodone-acetaminophen,  [START ON 01/18/2021] hydrocodone-acetaminophen, magnesium oxide, meloxicam, pantoprazole, potassium chloride sa, pravastatin, pregabalin, spironolactone, and symbicort.  Pharmacotherapy (Medications Ordered): Meds ordered this encounter  Medications  . HYDROcodone-acetaminophen (NORCO) 10-325 MG tablet    Sig: Take 1 tablet by mouth every 8 (eight) hours as needed for severe pain. Must last 30 days    Dispense:  90 tablet    Refill:  0    Chronic Pain: STOP Act (Not applicable) Fill 1 day early if closed on refill date. Avoid benzodiazepines within 8 hours of opioids  . HYDROcodone-acetaminophen (NORCO) 10-325 MG tablet    Sig: Take 1 tablet by mouth every 8 (eight) hours as needed for severe pain. Must last 30 days    Dispense:  90 tablet    Refill:  0    Chronic Pain: STOP Act (Not applicable) Fill 1 day early if closed on refill date. Avoid benzodiazepines within 8 hours of opioids  . HYDROcodone-acetaminophen (NORCO) 10-325 MG tablet    Sig: Take 1 tablet by mouth every 8 (eight) hours as needed for severe pain. Must last 30 days    Dispense:  90 tablet    Refill:  0    Chronic Pain: STOP Act (Not applicable) Fill 1 day early if closed on refill date. Avoid benzodiazepines within 8 hours of opioids   Orders:  No orders of the defined types were placed in this encounter.  Follow-up plan:   Return in about 3 months (around 02/17/2021) for (F2F), (Med Mgmt).      Considering:   Possible bilateral lumbar facet RFA    Palliative PRN treatment(s):   Palliative bilateral lumbar facet blocks (#3)  Palliativeintra-articular hip joint injection Palliative LESIs           Recent Visits No visits were found meeting these conditions. Showing recent visits within past 90 days and meeting all other requirements Today's Visits Date Type Provider Dept  11/09/20 Office Visit NMilinda Pointer MD [facility]  Showing today's visits and meeting all other requirements Future  Appointments No visits were found meeting these conditions. Showing future appointments within next 90 days and meeting all other requirements  I discussed the assessment and treatment plan with the patient. The patient was provided an opportunity to ask questions and all were answered. The patient agreed with the plan and demonstrated an understanding of the instructions.  Patient advised to call back or seek an in-person evaluation if the symptoms or condition worsens.  Duration of encounter: 30 minutes.  Note by: FGaspar Cola MD Date: 11/09/2020; Time: 10:06 AM

## 2020-11-09 ENCOUNTER — Other Ambulatory Visit: Payer: Self-pay | Admitting: Pain Medicine

## 2020-11-09 ENCOUNTER — Encounter: Payer: Self-pay | Admitting: Pain Medicine

## 2020-11-09 ENCOUNTER — Other Ambulatory Visit: Payer: Self-pay

## 2020-11-09 ENCOUNTER — Ambulatory Visit: Payer: Medicare PPO | Attending: Pain Medicine | Admitting: Pain Medicine

## 2020-11-09 VITALS — BP 161/91 | HR 93 | Temp 97.2°F | Ht 60.0 in | Wt 190.0 lb

## 2020-11-09 DIAGNOSIS — G8929 Other chronic pain: Secondary | ICD-10-CM | POA: Diagnosis present

## 2020-11-09 DIAGNOSIS — G894 Chronic pain syndrome: Secondary | ICD-10-CM | POA: Diagnosis not present

## 2020-11-09 DIAGNOSIS — M545 Low back pain, unspecified: Secondary | ICD-10-CM | POA: Diagnosis present

## 2020-11-09 DIAGNOSIS — F1120 Opioid dependence, uncomplicated: Secondary | ICD-10-CM | POA: Diagnosis present

## 2020-11-09 DIAGNOSIS — Z79899 Other long term (current) drug therapy: Secondary | ICD-10-CM

## 2020-11-09 DIAGNOSIS — M05742 Rheumatoid arthritis with rheumatoid factor of left hand without organ or systems involvement: Secondary | ICD-10-CM | POA: Diagnosis present

## 2020-11-09 DIAGNOSIS — M05741 Rheumatoid arthritis with rheumatoid factor of right hand without organ or systems involvement: Secondary | ICD-10-CM | POA: Diagnosis not present

## 2020-11-09 DIAGNOSIS — M25551 Pain in right hip: Secondary | ICD-10-CM

## 2020-11-09 DIAGNOSIS — M792 Neuralgia and neuritis, unspecified: Secondary | ICD-10-CM

## 2020-11-09 DIAGNOSIS — M19041 Primary osteoarthritis, right hand: Secondary | ICD-10-CM

## 2020-11-09 DIAGNOSIS — M19042 Primary osteoarthritis, left hand: Secondary | ICD-10-CM

## 2020-11-09 MED ORDER — MAGNESIUM OXIDE 400 (241.3 MG) MG PO TABS: 400.0000 mg | ORAL_TABLET | Freq: Every day | ORAL | 2 refills | Status: DC

## 2020-11-09 MED ORDER — HYDROCODONE-ACETAMINOPHEN 10-325 MG PO TABS: 1.0000 | ORAL_TABLET | Freq: Three times a day (TID) | ORAL | 0 refills | Status: DC | PRN

## 2020-11-09 MED ORDER — MELOXICAM 15 MG PO TABS: 15.0000 mg | ORAL_TABLET | Freq: Every day | ORAL | 2 refills | Status: DC

## 2020-11-09 MED ORDER — PREGABALIN 50 MG PO CAPS: 50.0000 mg | ORAL_CAPSULE | Freq: Two times a day (BID) | ORAL | 2 refills | Status: DC

## 2020-11-09 NOTE — Patient Instructions (Signed)
____________________________________________________________________________________________  Medication Rules  Purpose: To inform patients, and their family members, of our rules and regulations.  Applies to: All patients receiving prescriptions (written or electronic).  Pharmacy of record: Pharmacy where electronic prescriptions will be sent. If written prescriptions are taken to a different pharmacy, please inform the nursing staff. The pharmacy listed in the electronic medical record should be the one where you would like electronic prescriptions to be sent.  Electronic prescriptions: In compliance with the [place] Strengthen Opioid Misuse Prevention (STOP) Act of 2017 (Session Law 2017-74/H243), effective November 19, 2018, all controlled substances must be electronically prescribed. Calling prescriptions to the pharmacy will cease to exist.  Prescription refills: Only during scheduled appointments. Applies to all prescriptions.  NOTE: The following applies primarily to controlled substances (Opioid* Pain Medications).   Type of encounter (visit): For patients receiving controlled substances, face-to-face visits are required. (Not an option or up to the patient.)  Patient's responsibilities: 1. Pain Pills: Bring all pain pills to every appointment (except for procedure appointments). 2. Pill Bottles: Bring pills in original pharmacy bottle. Always bring the newest bottle. Bring bottle, even if empty. 3. Medication refills: You are responsible for knowing and keeping track of what medications you take and those you need refilled. The day before your appointment: write a list of all prescriptions that need to be refilled. The day of the appointment: give the list to the admitting nurse. Prescriptions will be written only during appointments. No prescriptions will be written on procedure days. If you forget a medication: it will not be "Called in", "Faxed", or "electronically sent".  You will need to get another appointment to get these prescribed. No early refills. Do not call asking to have your prescription filled early. 4. Prescription Accuracy: You are responsible for carefully inspecting your prescriptions before leaving our office. Have the discharge nurse carefully go over each prescription with you, before taking them home. Make sure that your name is accurately spelled, that your address is correct. Check the name and dose of your medication to make sure it is accurate. Check the number of pills, and the written instructions to make sure they are clear and accurate. Make sure that you are given enough medication to last until your next medication refill appointment. 5. Taking Medication: Take medication as prescribed. When it comes to controlled substances, taking less pills or less frequently than prescribed is permitted and encouraged. Never take more pills than instructed. Never take medication more frequently than prescribed.  6. Inform other Doctors: Always inform, all of your healthcare providers, of all the medications you take. 7. Pain Medication from other Providers: You are not allowed to accept any additional pain medication from any other Doctor or Healthcare provider. There are two exceptions to this rule. (see below) In the event that you require additional pain medication, you are responsible for notifying us, as stated below. 8. Cough Medicine: Often these contain an opioid, such as codeine or hydrocodone. Never accept or take cough medicine containing these opioids if you are already taking an opioid* medication. The combination may cause respiratory failure and death. 9. Medication Agreement: You are responsible for carefully reading and following our Medication Agreement. This must be signed before receiving any prescriptions from our practice. Safely store a copy of your signed Agreement. Violations to the Agreement will result in no further prescriptions.  (Additional copies of our Medication Agreement are available upon request.) 10. Laws, Rules, & Regulations: All patients are expected to follow all   Federal and State Laws, Statutes, Rules, & Regulations. Ignorance of the Laws does not constitute a valid excuse.  11. Illegal drugs and Controlled Substances: The use of illegal substances (including, but not limited to marijuana and its derivatives) and/or the illegal use of any controlled substances is strictly prohibited. Violation of this rule may result in the immediate and permanent discontinuation of any and all prescriptions being written by our practice. The use of any illegal substances is prohibited. 12. Adopted CDC guidelines & recommendations: Target dosing levels will be at or below 60 MME/day. Use of benzodiazepines** is not recommended.  Exceptions: There are only two exceptions to the rule of not receiving pain medications from other Healthcare Providers. 1. Exception #1 (Emergencies): In the event of an emergency (i.e.: accident requiring emergency care), you are allowed to receive additional pain medication. However, you are responsible for: As soon as you are able, call our office [phone], at any time of the day or night, and leave a message stating your name, the date and nature of the emergency, and the name and dose of the medication prescribed. In the event that your call is answered by a member of our staff, make sure to document and save the date, time, and the name of the person that took your information.  2. Exception #2 (Planned Surgery): In the event that you are scheduled by another doctor or dentist to have any type of surgery or procedure, you are allowed (for a period no longer than 30 days), to receive additional pain medication, for the acute post-op pain. However, in this case, you are responsible for picking up a copy of our "Post-op Pain Management for Surgeons" handout, and giving it to your surgeon or dentist. This  document is available at our office, and does not require an appointment to obtain it. Simply go to our office during business hours (Monday-Thursday from 8:00 AM to 4:00 PM) (Friday 8:00 AM to 12:00 Noon) or if you have a scheduled appointment with us, prior to your surgery, and ask for it by name. In addition, you are responsible for: calling our office [phone], at any time of the day or night, and leaving a message stating your name, name of your surgeon, type of surgery, and date of procedure or surgery. Failure to comply with your responsibilities may result in termination of therapy involving the controlled substances.  *Opioid medications include: morphine, codeine, oxycodone, oxymorphone, hydrocodone, hydromorphone, meperidine, tramadol, tapentadol, buprenorphine, fentanyl, methadone. **Benzodiazepine medications include: diazepam (Valium), alprazolam (Xanax), clonazepam (Klonopine), lorazepam (Ativan), clorazepate (Tranxene), chlordiazepoxide (Librium), estazolam (Prosom), oxazepam (Serax), temazepam (Restoril), triazolam (Halcion) (Last updated: 10/17/2020) ____________________________________________________________________________________________   ____________________________________________________________________________________________  Medication Recommendations and Reminders  Applies to: All patients receiving prescriptions (written and/or electronic).  Medication Rules & Regulations: These rules and regulations exist for your safety and that of others. They are not flexible and neither are we. Dismissing or ignoring them will be considered "non-compliance" with medication therapy, resulting in complete and irreversible termination of such therapy. (See document titled "Medication Rules" for more details.) In all conscience, because of safety reasons, we cannot continue providing a therapy where the patient does not follow instructions.  Pharmacy of record:   Definition:  This is the pharmacy where your electronic prescriptions will be sent.   We do not endorse any particular pharmacy, however, we have experienced problems with [facility] not securing enough medication supply for the community.  We do not restrict you in your choice of pharmacy. However,   once we write for your prescriptions, we will NOT be re-sending more prescriptions to fix restricted supply problems created by your pharmacy, or your insurance.   The pharmacy listed in the electronic medical record should be the one where you want electronic prescriptions to be sent.  If you choose to change pharmacy, simply notify our nursing staff.  Recommendations:  Keep all of your pain medications in a safe place, under lock and key, even if you live alone. We will NOT replace lost, stolen, or damaged medication.  After you fill your prescription, take 1 week's worth of pills and put them away in a safe place. You should keep a separate, properly labeled bottle for this purpose. The remainder should be kept in the original bottle. Use this as your primary supply, until it runs out. Once it's gone, then you know that you have 1 week's worth of medicine, and it is time to come in for a prescription refill. If you do this correctly, it is unlikely that you will ever run out of medicine.  To make sure that the above recommendation works, it is very important that you make sure your medication refill appointments are scheduled at least 1 week before you run out of medicine. To do this in an effective manner, make sure that you do not leave the office without scheduling your next medication management appointment. Always ask the nursing staff to show you in your prescription , when your medication will be running out. Then arrange for the receptionist to get you a return appointment, at least 7 days before you run out of medicine. Do not wait until you have 1 or 2 pills left, to come in. This is very poor planning and  does not take into consideration that we may need to cancel appointments due to bad weather, sickness, or emergencies affecting our staff.  DO NOT ACCEPT A "Partial Fill": If for any reason your pharmacy does not have enough pills/tablets to completely fill or refill your prescription, do not allow for a "partial fill". The law allows the pharmacy to complete that prescription within 72 hours, without requiring a new prescription. If they do not fill the rest of your prescription within those 72 hours, you will need a separate prescription to fill the remaining amount, which we will NOT provide. If the reason for the partial fill is your insurance, you will need to talk to the pharmacist about payment alternatives for the remaining tablets, but again, DO NOT ACCEPT A PARTIAL FILL, unless you can trust your pharmacist to obtain the remainder of the pills within 72 hours.  Prescription refills and/or changes in medication(s):   Prescription refills, and/or changes in dose or medication, will be conducted only during scheduled medication management appointments. (Applies to both, written and electronic prescriptions.)  No refills on procedure days. No medication will be changed or started on procedure days. No changes, adjustments, and/or refills will be conducted on a procedure day. Doing so will interfere with the diagnostic portion of the procedure.  No phone refills. No medications will be "called into the pharmacy".  No Fax refills.  No weekend refills.  No Holliday refills.  No after hours refills.  Remember:  Business hours are:  Monday to Thursday 8:00 AM to 4:00 PM Provider's Schedule: Lantz Hermann, MD - Appointments are:  Medication management: Monday and Wednesday 8:00 AM to 4:00 PM Procedure day: Tuesday and Thursday 7:30 AM to 4:00 PM Bilal Lateef, MD - Appointments are:    Medication management: Tuesday and Thursday 8:00 AM to 4:00 PM Procedure day: Monday and Wednesday  7:30 AM to 4:00 PM (Last update: 06/08/2020) ____________________________________________________________________________________________   ____________________________________________________________________________________________  CBD (cannabidiol) WARNING  Applicable to: All individuals currently taking or considering taking CBD (cannabidiol) and, more important, all patients taking opioid analgesic controlled substances (pain medication). (Example: oxycodone; oxymorphone; hydrocodone; hydromorphone; morphine; methadone; tramadol; tapentadol; fentanyl; buprenorphine; butorphanol; dextromethorphan; meperidine; codeine; etc.)  Legal status: CBD remains a Schedule I drug prohibited for any use. CBD is illegal with one exception. In the [place], CBD has a limited Food and Drug Administration (FDA) approval for the treatment of two specific types of epilepsy disorders. Only one CBD product has been approved by the FDA for this purpose: "Epidiolex". FDA is aware that some companies are marketing products containing cannabis and cannabis-derived compounds in ways that violate the Federal Food, Drug and Cosmetic Act (FD&C Act) and that may put the health and safety of consumers at risk. The FDA, a Federal agency, has not enforced the CBD status since 2018.   Legality: Some manufacturers ship CBD products nationally, which is illegal. Often such products are sold online and are therefore available throughout the country. CBD is openly sold in head shops and health food stores in some states where such sales have not been explicitly legalized. Selling unapproved products with unsubstantiated therapeutic claims is not only a violation of the law, but also can put patients at risk, as these products have not been proven to be safe or effective. Federal illegality makes it difficult to conduct research on CBD.  Reference: "FDA Regulation of Cannabis and Cannabis-Derived Products, Including Cannabidiol  (CBD)" - https://www.fda.gov/news-events/public-health-focus/fda-regulation-cannabis-and-cannabis-derived-products-including-cannabidiol-cbd  Warning: CBD is not FDA approved and has not undergo the same manufacturing controls as prescription drugs.  This means that the purity and safety of available CBD may be questionable. Most of the time, despite manufacturer's claims, it is contaminated with THC (delta-9-tetrahydrocannabinol - the chemical in marijuana responsible for the "HIGH").  When this is the case, the THC contaminant will trigger a positive urine drug screen (UDS) test for Marijuana (carboxy-THC). Because a positive UDS for any illicit substance is a violation of our medication agreement, your opioid analgesics (pain medicine) may be permanently discontinued.  MORE ABOUT CBD  General Information: CBD  is a derivative of the Marijuana (cannabis sativa) plant discovered in 1940. It is one of the 113 identified substances found in Marijuana. It accounts for up to 40% of the plant's extract. As of 2018, preliminary clinical studies on CBD included research for the treatment of anxiety, movement disorders, and pain. CBD is available and consumed in multiple forms, including inhalation of smoke or vapor, as an aerosol spray, and by mouth. It may be supplied as an oil containing CBD, capsules, dried cannabis, or as a liquid solution. CBD is thought not to be as psychoactive as THC (delta-9-tetrahydrocannabinol - the chemical in marijuana responsible for the "HIGH"). Studies suggest that CBD may interact with different biological target receptors in the body, including cannabinoid and other neurotransmitter receptors. As of 2018 the mechanism of action for its biological effects has not been determined.  Side-effects  Adverse reactions: Dry mouth, diarrhea, decreased appetite, fatigue, drowsiness, malaise, weakness, sleep disturbances, and others.  Drug interactions: CBC may interact with other  medications such as blood-thinners. (Last update: 06/25/2020) ____________________________________________________________________________________________    

## 2020-11-09 NOTE — Progress Notes (Signed)
Nursing Pain Medication Assessment:  Safety precautions to be maintained throughout the outpatient stay will include: orient to surroundings, keep bed in low position, maintain call bell within reach at all times, provide assistance with transfer out of bed and ambulation.  Nursing Pain Medication Assessment:  Safety precautions to be maintained throughout the outpatient stay will include: orient to surroundings, keep bed in low position, maintain call bell within reach at all times, provide assistance with transfer out of bed and ambulation.  Medication Inspection Compliance: Pill count conducted under aseptic conditions, in front of the patient. Neither the pills nor the bottle was removed from the patient's sight at any time. Once count was completed pills were immediately returned to the patient in their original bottle.  Medication: Hydrocodone/APAP Pill/Patch Count: 36 of 90 pills remain Pill/Patch Appearance: Markings consistent with prescribed medication Bottle Appearance: Standard pharmacy container. Clearly labeled. Filled Date: 74 / 2 / 21 Last Medication intake:  Today

## 2021-01-27 DIAGNOSIS — R600 Localized edema: Secondary | ICD-10-CM | POA: Insufficient documentation

## 2021-01-27 DIAGNOSIS — R5382 Chronic fatigue, unspecified: Secondary | ICD-10-CM | POA: Insufficient documentation

## 2021-02-14 NOTE — Progress Notes (Signed)
PROVIDER NOTE: Information contained herein reflects review and annotations entered in association with encounter. Interpretation of such information and data should be left to medically-trained personnel. Information provided to patient can be located elsewhere in the medical record under "Patient Instructions". Document created using STT-dictation technology, any transcriptional errors that may result from process are unintentional.    Patient: Mariah Poole  Service Category: E/M  Provider: Gaspar Cola, MD  DOB: Aug 01, 1946  DOS: 02/15/2021  Specialty: Interventional Pain Management  MRN: [id number]  Setting: Ambulatory outpatient  PCP: Marguerita Merles, MD  Type: Established Patient    Referring Provider: Marguerita Merles, MD  Location: Office  Delivery: Face-to-face     HPI  Mariah Poole, a 75 y.o. year old female, is here today because of her Chronic pain syndrome [G89.4]. Mariah Poole primary complain today is Back Pain Last encounter: My last encounter with her was on 11/09/2020. 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, L4-5, L5-S1) w/ neurogenic claudication; Lumbar foraminal stenosis (Multilevel) (Bilateral); Cervicalgia; DDD (degenerative disc disease), cervical; Cervical facet hypertrophy; Cervical facet joint syndrome; Lumbar facet hypertrophy; 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 Pain Assessment: Severity of Chronic pain is reported as a 5 /10. Location: Back  /pain radiaties to her buttock. Onset: More than a month ago. Quality: Burning,Aching,Throbbing. Timing: Constant. Modifying factor(s): meds, heat. Vitals:  height is 5' (1.524 m) and weight is 200 lb (90.7 kg). Her temperature is 97 F (36.1 C) (abnormal). Her blood pressure is 141/83 (abnormal) and her pulse is 108 (abnormal). Her oxygen saturation is 100%.   Reason for encounter: medication management.   The patient indicates doing well with the current medication regimen. No adverse reactions or side effects reported to the medications.  The patient indicates recently having fallen at another doctor's office, but she is doing okay.  No apparent sequela from the event.  RTCB: 05/18/2021 Nonopioids transferred 11/09/2020: Magnesium, Lyrica, and Mobic.  Pharmacotherapy Assessment   Analgesic: Hydrocodone/APAP 10/325, 1 tab PO q 8 hrs PRN (30 mg/day of hydrocodone) (975 mg/day of acetaminophen) MME/day:30 mg/day.   Monitoring: [place] PMP: PDMP reviewed during this encounter.       Pharmacotherapy: No side-effects or adverse reactions reported. Compliance: No problems identified. Effectiveness: Clinically acceptable.  Chauncey Fischer, RN  02/15/2021 11:30 AM  Sign when Signing Visit Nursing Pain Medication Assessment:  Safety precautions to be maintained throughout the outpatient stay will include: orient to surroundings, keep bed in low position, maintain call bell within reach at all times, provide assistance with transfer out of bed and ambulation.  Medication Inspection Compliance: Pill count conducted under aseptic conditions, in front of the patient. Neither the pills nor the bottle was removed from the patient's sight at any time. Once count was  completed pills were immediately returned to the patient in their original bottle.  Medication: Hydrocodone/APAP Pill/Patch Count: 2  of 90 pills remain Pill/Patch Appearance: Markings consistent with prescribed medication Bottle Appearance: Standard pharmacy container. Clearly labeled. Filled Date: 3 / 1 / 22 Last Medication intake:  TodaySafety precautions to be maintained throughout the outpatient stay will include: orient to surroundings, keep bed in low position, maintain call bell within reach at all times, provide assistance with transfer out of bed and ambulation.     UDS:  Summary  Date Value Ref Range Status  05/25/2020 Note  Final    Comment:    ==================================================================== [facility] (MW) ==================================================================== Test                             Result       Flag       Units  Drug Present and Declared for Prescription Verification   Hydrocodone                    2868         EXPECTED   ng/mg creat   Hydromorphone                  300          EXPECTED   ng/mg creat   Dihydrocodeine                 623          EXPECTED   ng/mg creat   Norhydrocodone                 2459         EXPECTED   ng/mg creat    Sources of hydrocodone include scheduled prescription medications.    Hydromorphone, dihydrocodeine and norhydrocodone are expected    metabolites of hydrocodone. Hydromorphone and dihydrocodeine are    also available as scheduled prescription medications.  ==================================================================== Test                      Result    Flag   Units      Ref Range   Creatinine              22               mg/dL      >=20 ==================================================================== Declared Medications:  The flagging and interpretation on this report are based on the  following declared medications.  Unexpected results may arise from  inaccuracies  in the declared medications.   **Note: The testing scope of this panel includes these medications:   Hydrocodone (Norco)   **Note: The testing scope of this panel does not include the  following reported medications:   Acetaminophen (Norco)  Albuterol (Ventolin HFA)  Atenolol (Tenormin)  Budesonide (Symbicort)  Duloxetine (Cymbalta)  Formoterol (Symbicort)  Furosemide (Lasix)  Iron  Losartan (Cozaar)  Magnesium (Mag-Ox)  Meloxicam (Mobic)  Pantoprazole (Protonix)  Potassium (Klor-Con)  Pravastatin (Pravachol)  Pregabalin (Lyrica)  Spironolactone (Aldactone)  Turmeric  Vitamin C  Vitamin D3 ==================================================================== For clinical consultation, please call [phone]. ====================================================================      ROS  Constitutional: Denies any fever or chills Gastrointestinal: No reported hemesis, hematochezia, vomiting, or acute GI distress Musculoskeletal: Denies any acute onset joint swelling, redness, loss of ROM, or weakness Neurological: No reported episodes of acute onset apraxia, aphasia, dysarthria, agnosia, amnesia, paralysis, loss of coordination, or loss of consciousness  Medication Review  DULoxetine, HYDROcodone-acetaminophen, Turmeric Curcumin, Vitamin D3, albuterol, atenolol,  budesonide-formoterol, ferrous sulfate, furosemide, losartan, magnesium oxide, meloxicam, pantoprazole, potassium chloride SA, pravastatin, pregabalin, spironolactone, and vitamin C  History Review  Allergy: Mariah Poole is allergic to contrast media [iodinated diagnostic agents], ioxaglate, and shellfish allergy. Drug: Mariah Poole  reports current drug use. Alcohol:  reports no history of alcohol use. Tobacco:  reports that she has never smoked. She has never used smokeless tobacco. Social: Mariah Poole  reports that she has never smoked. She has never used smokeless tobacco. She reports current drug use. She  reports that she does not drink alcohol. Medical:  has a past medical history of Anemia, Arthritis, COPD (chronic obstructive pulmonary disease) ([place]), Depression, Diabetes mellitus without complication ([place]), GERD (gastroesophageal reflux disease), Hiatal hernia, History of contrast media allergy. (IVP dye) (09/01/2015), History of hiatal hernia, Hypercholesteremia, Hypertension, Morbid obesity ([place]), Scoliosis, and Shortness of breath dyspnea. Surgical: Mariah Poole  has a past surgical history that includes Abdominal hysterectomy; Cataract extraction w/ intraocular lens implant (Right); Gastric bypass; Hernia repair; Cataract extraction w/PHACO (Left, 07/19/2015); Cholecystectomy; and Hernia repair. Family: family history includes Diabetes in her mother; Heart disease in her father.  Laboratory Chemistry Profile   Renal Lab Results  Component Value Date   BUN 24 06/17/2019   CREATININE 1.17 (H) 06/17/2019   BCR 21 06/17/2019   GFRAA 53 (L) 06/17/2019   GFRNONAA 46 (L) 06/17/2019     Hepatic Lab Results  Component Value Date   AST 15 06/17/2019   ALT 9 05/20/2017   ALBUMIN 4.3 06/17/2019   ALKPHOS 101 06/17/2019   LIPASE 19 10/27/2015     Electrolytes Lab Results  Component Value Date   NA 141 06/17/2019   K 3.9 06/17/2019   CL 103 06/17/2019   CALCIUM 9.3 06/17/2019   MG 1.9 06/17/2019     Bone Lab Results  Component Value Date   25OHVITD1 45 06/17/2019   25OHVITD2 <1.0 06/17/2019   25OHVITD3 45 06/17/2019     Inflammation (CRP: Acute Phase) (ESR: Chronic Phase) Lab Results  Component Value Date   CRP 7 06/17/2019   ESRSEDRATE 49 (H) 06/17/2019   LATICACIDVEN 4.0 (H) 11/23/2009       Note: Above Lab results reviewed.  Recent Imaging Review  NM Myocar Multi W/Spect W/Wall Motion / EF  Blood pressure demonstrated a normal response to exercise.  There was no ST segment deviation noted during stress.  The study is normal.  This is a low risk study.  The  left ventricular ejection fraction is normal (55-65%).   Note: Reviewed        Physical Exam  General appearance: Well nourished, well developed, and well hydrated. In no apparent acute distress Mental status: Alert, oriented x 3 (person, place, & time)       Respiratory: No evidence of acute respiratory distress Eyes: PERLA Vitals: BP (!) 141/83   Pulse (!) 108   Temp (!) 97 F (36.1 C)   Ht 5' (1.524 m)   Wt 200 lb (90.7 kg)   SpO2 100%   BMI 39.06 kg/m  BMI: Estimated body mass index is 39.06 kg/m as calculated from the following:   Height as of this encounter: 5' (1.524 m).   Weight as of this encounter: 200 lb (90.7 kg). Ideal: Ideal body weight: 45.5 kg (100 lb 4.9 oz) Adjusted ideal body weight: 63.6 kg (140 lb 3 oz)  Assessment   Status Diagnosis  Controlled Controlled Controlled 1. Chronic pain syndrome   2. Chronic low back pain (  1ry area of Pain) (Bilateral) (R>L) w/o sciatica   3. Rheumatoid arthritis involving both hands with positive rheumatoid factor ([place])   4. 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 Cervicalgia   7. Pharmacologic therapy   8. Chronic use of opiate for therapeutic purpose   9. Uncomplicated opioid dependence ([place])      Updated Problems: Problem  Chronic Fatigue   Last Assessment & Plan:  Formatting of this note might be different from the original. Needs sleep study  CBC and TSH today   Edema, Lower Extremity   Last Assessment & Plan:  Formatting of this note might be different from the original. Patient has gain weight since last measurement. She hasn't noticed an increase in edema.  -continue lasix as prescribed - elevated legs while sleeping and/or sitting -wear compression socks and/or hose   Chronic low back pain (1ry area of Pain) (Bilateral) (R>L) w/o sciatica  Chronic Use of Opiate for Therapeutic Purpose  Gastroesophageal Reflux Disease Without  Esophagitis  Hypokalemia  Sob (Shortness of Breath)   Last Assessment & Plan:  Formatting of this note might be different from the original. SOB is present upon exertion. releived with rest and albuterol. Pulm exam is normal. LE are positive for edema.  [ ] chest x ray to rule out pulm or cardiac pathology or anatomic  [ ] BNP, echo to rule out cardiac pathology  - f/u in 1 week   Patient Is Jehovah's Witness  S/P Gastric Bypass  Morbid Obesity ([facility])  Chronic Diastolic Chf (Congestive Heart Failure), Ny[place] Class 3 ([facility])  Abnormal Ecg  Acute On Chronic Heart Failure With Preserved Ejection Fraction ([facility])   Last Assessment & Plan:  Formatting of this note might be different from the original. Symptoms improving w/ spironolactone Increase spironolactone to 25 mg daily, repeat labs Will consider sleep study in future   Depressed Mood  Hyperlipidemia  Iron Deficiency Anemia  Mild Intermittent Asthma Without Complication   Last Assessment & Plan:  Formatting of this note might be different from the original. Patient is currently prescribed Symbicort and prn albuterol. She has been taking symbicort as prescribed and is taking the albuterol multiple times a day after doing daily task and/or walking short distances. Will continue medication as prescribed due to lung exam being WNL and follow up in one week. Will consider pulm consult pending lab results   Chronic Heart Failure With Preserved Ejection Fraction ([facility])   Last Assessment & Plan:  Formatting of this note might be different from the original. Resume spironolactone Needs sleep study   Diabetes ([facility])  Ventral Incisional Hernia With Obstruction  History of Cholecystectomy  Extravasation Injury   Formatting of this note might be different from the original. 40m Multihance lot [id number]   Diabetes Mellitus ([facility])  Essential Hypertension   Last Assessment & Plan:  Formatting of this note might be different from the  original. BP in range, continue current regimen   Depression  Administrative Encounter  H/O Disease  Prolapse of Vaginal Vault After Hysterectomy    Plan of Care  Problem-specific:  No problem-specific Assessment & Plan notes found for this encounter.  Ms. EMERELYN KLUMPhas a current medication list which includes the following long-term medication(s): albuterol, atenolol, budesonide-formoterol, duloxetine, ferosul, furosemide, pantoprazole, potassium chloride sa, pravastatin, spironolactone, symbicort, [START ON 02/17/2021] hydrocodone-acetaminophen, [START ON 03/19/2021] hydrocodone-acetaminophen, [START ON 04/18/2021] hydrocodone-acetaminophen, magnesium oxide,  meloxicam, and pregabalin.  Pharmacotherapy (Medications Ordered): Meds ordered this encounter  Medications  . HYDROcodone-acetaminophen (NORCO) 10-325 MG tablet    Sig: Take 1 tablet by mouth every 8 (eight) hours as needed for severe pain. Must last 30 days    Dispense:  90 tablet    Refill:  0    Not a duplicate. Do NOT delete! Chronic Pain: STOP Act NOT applicable. Fill 1 day early if closed on refill date. Avoid benzodiazepines within 8 hours of opioids. Do not send refill requests.  Marland Kitchen HYDROcodone-acetaminophen (NORCO) 10-325 MG tablet    Sig: Take 1 tablet by mouth every 8 (eight) hours as needed for severe pain. Must last 30 days    Dispense:  90 tablet    Refill:  0    Not a duplicate. Do NOT delete! Chronic Pain: STOP Act NOT applicable. Fill 1 day early if closed on refill date. Avoid benzodiazepines within 8 hours of opioids. Do not send refill requests.  Marland Kitchen HYDROcodone-acetaminophen (NORCO) 10-325 MG tablet    Sig: Take 1 tablet by mouth every 8 (eight) hours as needed for severe pain. Must last 30 days    Dispense:  90 tablet    Refill:  0    Not a duplicate. Do NOT delete! Chronic Pain: STOP Act NOT applicable. Fill 1 day early if closed on refill date. Avoid benzodiazepines within 8 hours of opioids. Do not send  refill requests.   Orders:  No orders of the defined types were placed in this encounter.  Follow-up plan:   Return in about 3 months (around 05/18/2021) for (F2F), (MM).      Considering:   Possible bilateral lumbar facet RFA    Palliative PRN treatment(s):   Palliative bilateral lumbar facet blocks (#3)  Palliativeintra-articular hip joint injection Palliative LESIs            Recent Visits No visits were found meeting these conditions. Showing recent visits within past 90 days and meeting all other requirements Today's Visits Date Type Provider Dept  02/15/21 Office Visit Milinda Pointer, MD [facility]  Showing today's visits and meeting all other requirements Future Appointments No visits were found meeting these conditions. Showing future appointments within next 90 days and meeting all other requirements  I discussed the assessment and treatment plan with the patient. The patient was provided an opportunity to ask questions and all were answered. The patient agreed with the plan and demonstrated an understanding of the instructions.  Patient advised to call back or seek an in-person evaluation if the symptoms or condition worsens.  Duration of encounter: 25 minutes.  Note by: Gaspar Cola, MD Date: 02/15/2021; Time: 11:44 AM

## 2021-02-15 ENCOUNTER — Other Ambulatory Visit: Payer: Self-pay

## 2021-02-15 ENCOUNTER — Ambulatory Visit: Payer: Medicare PPO | Attending: Pain Medicine | Admitting: Pain Medicine

## 2021-02-15 ENCOUNTER — Encounter: Payer: Self-pay | Admitting: Pain Medicine

## 2021-02-15 VITALS — BP 141/83 | HR 108 | Temp 97.0°F | Ht 60.0 in | Wt 200.0 lb

## 2021-02-15 DIAGNOSIS — Z79899 Other long term (current) drug therapy: Secondary | ICD-10-CM | POA: Diagnosis present

## 2021-02-15 DIAGNOSIS — M545 Low back pain, unspecified: Secondary | ICD-10-CM | POA: Diagnosis present

## 2021-02-15 DIAGNOSIS — M21969 Unspecified acquired deformity of unspecified lower leg: Secondary | ICD-10-CM | POA: Diagnosis present

## 2021-02-15 DIAGNOSIS — F1120 Opioid dependence, uncomplicated: Secondary | ICD-10-CM | POA: Insufficient documentation

## 2021-02-15 DIAGNOSIS — Z79891 Long term (current) use of opiate analgesic: Secondary | ICD-10-CM | POA: Insufficient documentation

## 2021-02-15 DIAGNOSIS — G894 Chronic pain syndrome: Secondary | ICD-10-CM | POA: Diagnosis not present

## 2021-02-15 DIAGNOSIS — M05741 Rheumatoid arthritis with rheumatoid factor of right hand without organ or systems involvement: Secondary | ICD-10-CM | POA: Insufficient documentation

## 2021-02-15 DIAGNOSIS — M069 Rheumatoid arthritis, unspecified: Secondary | ICD-10-CM | POA: Diagnosis present

## 2021-02-15 DIAGNOSIS — G8929 Other chronic pain: Secondary | ICD-10-CM | POA: Diagnosis present

## 2021-02-15 DIAGNOSIS — M05742 Rheumatoid arthritis with rheumatoid factor of left hand without organ or systems involvement: Secondary | ICD-10-CM | POA: Insufficient documentation

## 2021-02-15 DIAGNOSIS — M21949 Unspecified acquired deformity of hand, unspecified hand: Secondary | ICD-10-CM | POA: Insufficient documentation

## 2021-02-15 DIAGNOSIS — M542 Cervicalgia: Secondary | ICD-10-CM | POA: Diagnosis present

## 2021-02-15 MED ORDER — HYDROCODONE-ACETAMINOPHEN 10-325 MG PO TABS: 1.0000 | ORAL_TABLET | Freq: Three times a day (TID) | ORAL | 0 refills | Status: DC | PRN

## 2021-02-15 NOTE — Progress Notes (Signed)
Nursing Pain Medication Assessment:  Safety precautions to be maintained throughout the outpatient stay will include: orient to surroundings, keep bed in low position, maintain call bell within reach at all times, provide assistance with transfer out of bed and ambulation.  Medication Inspection Compliance: Pill count conducted under aseptic conditions, in front of the patient. Neither the pills nor the bottle was removed from the patient's sight at any time. Once count was completed pills were immediately returned to the patient in their original bottle.  Medication: Hydrocodone/APAP Pill/Patch Count: 2 of 90 pills remain Pill/Patch Appearance: Markings consistent with prescribed medication Bottle Appearance: Standard pharmacy container. Clearly labeled. Filled Date: 3 / 1 / 22 Last Medication intake:  TodaySafety precautions to be maintained throughout the outpatient stay will include: orient to surroundings, keep bed in low position, maintain call bell within reach at all times, provide assistance with transfer out of bed and ambulation.

## 2021-02-15 NOTE — Patient Instructions (Signed)
____________________________________________________________________________________________  Medication Recommendations and Reminders  Applies to: All patients receiving prescriptions (written and/or electronic).  Medication Rules & Regulations: These rules and regulations exist for your safety and that of others. They are not flexible and neither are we. Dismissing or ignoring them will be considered "non-compliance" with medication therapy, resulting in complete and irreversible termination of such therapy. (See document titled "Medication Rules" for more details.) In all conscience, because of safety reasons, we cannot continue providing a therapy where the patient does not follow instructions.  Pharmacy of record:   Definition: This is the pharmacy where your electronic prescriptions will be sent.   We do not endorse any particular pharmacy, however, we have experienced problems with [facility] not securing enough medication supply for the community.  We do not restrict you in your choice of pharmacy. However, once we write for your prescriptions, we will NOT be re-sending more prescriptions to fix restricted supply problems created by your pharmacy, or your insurance.   The pharmacy listed in the electronic medical record should be the one where you want electronic prescriptions to be sent.  If you choose to change pharmacy, simply notify our nursing staff.  Recommendations:  Keep all of your pain medications in a safe place, under lock and key, even if you live alone. We will NOT replace lost, stolen, or damaged medication.  After you fill your prescription, take 1 week's worth of pills and put them away in a safe place. You should keep a separate, properly labeled bottle for this purpose. The remainder should be kept in the original bottle. Use this as your primary supply, until it runs out. Once it's gone, then you know that you have 1 week's worth of medicine, and it is time to come  in for a prescription refill. If you do this correctly, it is unlikely that you will ever run out of medicine.  To make sure that the above recommendation works, it is very important that you make sure your medication refill appointments are scheduled at least 1 week before you run out of medicine. To do this in an effective manner, make sure that you do not leave the office without scheduling your next medication management appointment. Always ask the nursing staff to show you in your prescription , when your medication will be running out. Then arrange for the receptionist to get you a return appointment, at least 7 days before you run out of medicine. Do not wait until you have 1 or 2 pills left, to come in. This is very poor planning and does not take into consideration that we may need to cancel appointments due to bad weather, sickness, or emergencies affecting our staff.  DO NOT ACCEPT A "Partial Fill": If for any reason your pharmacy does not have enough pills/tablets to completely fill or refill your prescription, do not allow for a "partial fill". The law allows the pharmacy to complete that prescription within 72 hours, without requiring a new prescription. If they do not fill the rest of your prescription within those 72 hours, you will need a separate prescription to fill the remaining amount, which we will NOT provide. If the reason for the partial fill is your insurance, you will need to talk to the pharmacist about payment alternatives for the remaining tablets, but again, DO NOT ACCEPT A PARTIAL FILL, unless you can trust your pharmacist to obtain the remainder of the pills within 72 hours.  Prescription refills and/or changes in medication(s):     Prescription refills, and/or changes in dose or medication, will be conducted only during scheduled medication management appointments. (Applies to both, written and electronic prescriptions.)  No refills on procedure days. No medication will be  changed or started on procedure days. No changes, adjustments, and/or refills will be conducted on a procedure day. Doing so will interfere with the diagnostic portion of the procedure.  No phone refills. No medications will be "called into the pharmacy".  No Fax refills.  No weekend refills.  No Holliday refills.  No after hours refills.  Remember:  Business hours are:  Monday to Thursday 8:00 AM to 4:00 PM Provider's Schedule: Octavian Godek, MD - Appointments are:  Medication management: Monday and Wednesday 8:00 AM to 4:00 PM Procedure day: Tuesday and Thursday 7:30 AM to 4:00 PM Bilal Lateef, MD - Appointments are:  Medication management: Tuesday and Thursday 8:00 AM to 4:00 PM Procedure day: Monday and Wednesday 7:30 AM to 4:00 PM (Last update: 06/08/2020) ____________________________________________________________________________________________   ____________________________________________________________________________________________  CBD (cannabidiol) WARNING  Applicable to: All individuals currently taking or considering taking CBD (cannabidiol) and, more important, all patients taking opioid analgesic controlled substances (pain medication). (Example: oxycodone; oxymorphone; hydrocodone; hydromorphone; morphine; methadone; tramadol; tapentadol; fentanyl; buprenorphine; butorphanol; dextromethorphan; meperidine; codeine; etc.)  Legal status: CBD remains a Schedule I drug prohibited for any use. CBD is illegal with one exception. In the [place], CBD has a limited Food and Drug Administration (FDA) approval for the treatment of two specific types of epilepsy disorders. Only one CBD product has been approved by the FDA for this purpose: "Epidiolex". FDA is aware that some companies are marketing products containing cannabis and cannabis-derived compounds in ways that violate the Federal Food, Drug and Cosmetic Act (FD&C Act) and that may put the health and safety  of consumers at risk. The FDA, a Federal agency, has not enforced the CBD status since 2018.   Legality: Some manufacturers ship CBD products nationally, which is illegal. Often such products are sold online and are therefore available throughout the country. CBD is openly sold in head shops and health food stores in some states where such sales have not been explicitly legalized. Selling unapproved products with unsubstantiated therapeutic claims is not only a violation of the law, but also can put patients at risk, as these products have not been proven to be safe or effective. Federal illegality makes it difficult to conduct research on CBD.  Reference: "FDA Regulation of Cannabis and Cannabis-Derived Products, Including Cannabidiol (CBD)" - https://www.fda.gov/news-events/public-health-focus/fda-regulation-cannabis-and-cannabis-derived-products-including-cannabidiol-cbd  Warning: CBD is not FDA approved and has not undergo the same manufacturing controls as prescription drugs.  This means that the purity and safety of available CBD may be questionable. Most of the time, despite manufacturer's claims, it is contaminated with THC (delta-9-tetrahydrocannabinol - the chemical in marijuana responsible for the "HIGH").  When this is the case, the THC contaminant will trigger a positive urine drug screen (UDS) test for Marijuana (carboxy-THC). Because a positive UDS for any illicit substance is a violation of our medication agreement, your opioid analgesics (pain medicine) may be permanently discontinued.  MORE ABOUT CBD  General Information: CBD  is a derivative of the Marijuana (cannabis sativa) plant discovered in 1940. It is one of the 113 identified substances found in Marijuana. It accounts for up to 40% of the plant's extract. As of 2018, preliminary clinical studies on CBD included research for the treatment of anxiety, movement disorders, and pain. CBD is available and consumed in multiple forms,  including   inhalation of smoke or vapor, as an aerosol spray, and by mouth. It may be supplied as an oil containing CBD, capsules, dried cannabis, or as a liquid solution. CBD is thought not to be as psychoactive as THC (delta-9-tetrahydrocannabinol - the chemical in marijuana responsible for the "HIGH"). Studies suggest that CBD may interact with different biological target receptors in the body, including cannabinoid and other neurotransmitter receptors. As of 2018 the mechanism of action for its biological effects has not been determined.  Side-effects  Adverse reactions: Dry mouth, diarrhea, decreased appetite, fatigue, drowsiness, malaise, weakness, sleep disturbances, and others.  Drug interactions: CBC may interact with other medications such as blood-thinners. (Last update: 06/25/2020) ____________________________________________________________________________________________    

## 2021-05-09 ENCOUNTER — Other Ambulatory Visit: Payer: Self-pay

## 2021-05-09 ENCOUNTER — Ambulatory Visit: Payer: Medicare PPO | Attending: Pain Medicine | Admitting: Pain Medicine

## 2021-05-09 DIAGNOSIS — G894 Chronic pain syndrome: Secondary | ICD-10-CM

## 2021-05-09 DIAGNOSIS — M542 Cervicalgia: Secondary | ICD-10-CM

## 2021-05-09 DIAGNOSIS — Z79899 Other long term (current) drug therapy: Secondary | ICD-10-CM

## 2021-05-09 DIAGNOSIS — M545 Low back pain, unspecified: Secondary | ICD-10-CM

## 2021-05-09 DIAGNOSIS — M51369 Other intervertebral disc degeneration, lumbar region without mention of lumbar back pain or lower extremity pain: Secondary | ICD-10-CM

## 2021-05-09 DIAGNOSIS — Z79891 Long term (current) use of opiate analgesic: Secondary | ICD-10-CM

## 2021-05-09 DIAGNOSIS — M47816 Spondylosis without myelopathy or radiculopathy, lumbar region: Secondary | ICD-10-CM | POA: Diagnosis not present

## 2021-05-09 DIAGNOSIS — G8929 Other chronic pain: Secondary | ICD-10-CM

## 2021-05-09 DIAGNOSIS — M47812 Spondylosis without myelopathy or radiculopathy, cervical region: Secondary | ICD-10-CM

## 2021-05-09 DIAGNOSIS — M5136 Other intervertebral disc degeneration, lumbar region: Secondary | ICD-10-CM

## 2021-05-09 DIAGNOSIS — M79641 Pain in right hand: Secondary | ICD-10-CM

## 2021-05-09 DIAGNOSIS — M25551 Pain in right hip: Secondary | ICD-10-CM

## 2021-05-09 MED ORDER — HYDROCODONE-ACETAMINOPHEN 10-325 MG PO TABS: 1.0000 | ORAL_TABLET | Freq: Three times a day (TID) | ORAL | 0 refills | Status: DC | PRN

## 2021-05-09 NOTE — Progress Notes (Signed)
Patient: Mariah Poole  Service Category: E/M  Provider: Gaspar Cola, MD  DOB: November 18, 1946  DOS: 05/09/2021  Location: Office  MRN: [id number]  Setting: Ambulatory outpatient  Referring Provider: Marguerita Merles, MD  Type: Established Patient  Specialty: Interventional Pain Management  PCP: Marguerita Merles, MD  Location: Remote location  Delivery: TeleHealth     Virtual Encounter - Pain Management PROVIDER NOTE: Information contained herein reflects review and annotations entered in association with encounter. Interpretation of such information and data should be left to medically-trained personnel. Information provided to patient can be located elsewhere in the medical record under "Patient Instructions". Document created using STT-dictation technology, any transcriptional errors that may result from process are unintentional.    Contact & Pharmacy Preferred: [phone] Home: [phone] (home) Mobile: [phone] (mobile) E-mail: [email or web address]  [id number]: [phone]: [phone]  Pre-screening  Mariah Poole offered "in-person" vs "virtual" encounter. She indicated preferring virtual for this encounter.   Reason COVID-19*  Social distancing based on CDC and AMA recommendations.   I contacted EKirandeep Poole on 05/09/2021 via telephone.      I clearly identified myself as FGaspar Cola MD. I verified that I was speaking with the correct person using two identifiers (Name: Mariah Poole and date of birth: 6October 15, 1947.  Consent I sought verbal advanced consent from EWynona Dovefor virtual visit interactions. I informed Ms. Maple of possible security and privacy concerns, risks, and limitations associated with providing "not-in-person" medical evaluation and management services. I also informed Mariah Poole of the availability of "in-person" appointments. Finally, I  informed her that there would be a charge for the virtual visit and that she could be  personally, fully or partially, financially responsible for it. Mariah Poole understanding and agreed to proceed.   Historic Elements   Ms. EMARLIN BRYSis a 75y.o. year old, female patient evaluated today after our last contact on 02/15/2021. Mariah Poole has a past medical history of Anemia, Arthritis, COPD (chronic obstructive pulmonary disease) ([place], Depression, Diabetes mellitus without complication ([place], GERD (gastroesophageal reflux disease), Hiatal hernia, History of contrast media allergy. (IVP dye) (09/01/2015), History of hiatal hernia, Hypercholesteremia, Hypertension, Morbid obesity ([place], Scoliosis, and Shortness of breath dyspnea. She also  has a past surgical history that includes Abdominal hysterectomy; Cataract extraction w/ intraocular lens implant (Right); Gastric bypass; Hernia repair; Cataract extraction w/PHACO (Left, 07/19/2015); Cholecystectomy; and Hernia repair. Ms. TStatehas a current medication list which includes the following prescription(s): albuterol, atenolol, budesonide-formoterol, vitamin d3, duloxetine, ferosul, furosemide, losartan, magnesium oxide, meloxicam, pantoprazole, potassium chloride sa, pravastatin, pregabalin, spironolactone, symbicort, turmeric curcumin, vitamin c, and [START ON 05/18/2021] hydrocodone-acetaminophen. She  reports that she has never smoked. She has never used smokeless tobacco. She reports current drug use. She reports that she does not drink alcohol. Ms. TMarletteis allergic to contrast media [iodinated diagnostic agents], ioxaglate, and shellfish allergy.   HPI  Today, she is being contacted for medication management.   The patient indicates doing well with the current medication regimen. No adverse reactions or side effects reported to the medications.   RTCB: 06/17/2021 Nonopioids transferred 11/09/2020: Magnesium, Lyrica, and  Mobic.  Pharmacotherapy Assessment  Analgesic: Hydrocodone/APAP 10/325, 1 tab PO q 8 hrs PRN (30 mg/day of hydrocodone) (975 mg/day of acetaminophen) MME/day: 30 mg/day.   Monitoring: [place] PMP: PDMP reviewed during this encounter.  Pharmacotherapy: No side-effects or adverse reactions reported. Compliance: No problems identified. Effectiveness: Clinically acceptable. Plan: Refer to "POC".  UDS:  Summary  Date Value Ref Range Status  05/25/2020 Note  Final    Comment:    ==================================================================== [facility] Select 13 (MW) ==================================================================== Test                             Result       Flag       Units  Drug Present and Declared for Prescription Verification   Hydrocodone                    2868         EXPECTED   ng/mg creat   Hydromorphone                  300          EXPECTED   ng/mg creat   Dihydrocodeine                 623          EXPECTED   ng/mg creat   Norhydrocodone                 2459         EXPECTED   ng/mg creat    Sources of hydrocodone include scheduled prescription medications.    Hydromorphone, dihydrocodeine and norhydrocodone are expected    metabolites of hydrocodone. Hydromorphone and dihydrocodeine are    also available as scheduled prescription medications.  ==================================================================== Test                      Result    Flag   Units      Ref Range   Creatinine              22               mg/dL      >=20 ==================================================================== Declared Medications:  The flagging and interpretation on this report are based on the  following declared medications.  Unexpected results may arise from  inaccuracies in the declared medications.   **Note: The testing scope of this panel includes these medications:   Hydrocodone (Norco)   **Note: The testing scope of this panel does not  include the  following reported medications:   Acetaminophen (Norco)  Albuterol (Ventolin HFA)  Atenolol (Tenormin)  Budesonide (Symbicort)  Duloxetine (Cymbalta)  Formoterol (Symbicort)  Furosemide (Lasix)  Iron  Losartan (Cozaar)  Magnesium (Mag-Ox)  Meloxicam (Mobic)  Pantoprazole (Protonix)  Potassium (Klor-Con)  Pravastatin (Pravachol)  Pregabalin (Lyrica)  Spironolactone (Aldactone)  Turmeric  Vitamin C  Vitamin D3 ==================================================================== For clinical consultation, please call [phone]. ====================================================================     Laboratory Chemistry Profile   Renal Lab Results  Component Value Date   BUN 24 06/17/2019   CREATININE 1.17 (H) 06/17/2019   BCR 21 06/17/2019   GFRAA 53 (L) 06/17/2019   GFRNONAA 46 (L) 06/17/2019     Hepatic Lab Results  Component Value Date   AST 15 06/17/2019   ALT 9 05/20/2017   ALBUMIN 4.3 06/17/2019   ALKPHOS 101 06/17/2019   LIPASE 19 10/27/2015     Electrolytes Lab Results  Component Value Date   NA 141 06/17/2019   K 3.9 06/17/2019   CL 103 06/17/2019   CALCIUM 9.3 06/17/2019   MG 1.9 06/17/2019  Bone Lab Results  Component Value Date   25OHVITD1 45 06/17/2019   25OHVITD2 <1.0 06/17/2019   25OHVITD3 45 06/17/2019     Inflammation (CRP: Acute Phase) (ESR: Chronic Phase) Lab Results  Component Value Date   CRP 7 06/17/2019   ESRSEDRATE 49 (H) 06/17/2019   LATICACIDVEN 4.0 (H) 11/23/2009       Note: Above Lab results reviewed.  Imaging  NM Myocar Multi W/Spect W/Wall Motion / EF  Blood pressure demonstrated a normal response to exercise.  There was no ST segment deviation noted during stress.  The study is normal.  This is a low risk study.  The left ventricular ejection fraction is normal (55-65%).    Assessment  The primary encounter diagnosis was Chronic pain syndrome. Diagnoses of Chronic low back pain  (1ry area of Pain) (Bilateral) (R>L) w/o sciatica, DDD (degenerative disc disease), lumbar, Lumbar facet syndrome (Bilateral) (R>L), Chronic hip pain (Right), Cervicalgia, Cervical facet joint syndrome, Chronic hand pain, right, Pharmacologic therapy, and Chronic use of opiate for therapeutic purpose were also pertinent to this visit.  Plan of Care  Problem-specific:  No problem-specific Assessment & Plan notes found for this encounter.  Mariah Poole has a current medication list which includes the following long-term medication(s): albuterol, atenolol, budesonide-formoterol, duloxetine, ferosul, furosemide, magnesium oxide, meloxicam, pantoprazole, potassium chloride sa, pravastatin, pregabalin, spironolactone, symbicort, and [START ON 05/18/2021] hydrocodone-acetaminophen.  Pharmacotherapy (Medications Ordered): Meds ordered this encounter  Medications   HYDROcodone-acetaminophen (NORCO) 10-325 MG tablet    Sig: Take 1 tablet by mouth every 8 (eight) hours as needed for severe pain. Must last 30 days    Dispense:  90 tablet    Refill:  0    Not a duplicate. Do NOT delete! Dispense 1 day early if closed on refill date. Avoid benzodiazepines within 8 hours of opioids. Do not send refill requests.    Orders:  No orders of the defined types were placed in this encounter.  Follow-up plan:   Return in about 6 weeks (around 06/17/2021) for evaluation day (F2F) (MM).      Considering:   Possible bilateral lumbar facet RFA    Palliative PRN treatment(s):   Palliative bilateral lumbar facet blocks (#3)  Palliative intra-articular hip joint injection  Palliative LESIs             Recent Visits Date Type Provider Dept  02/15/21 Office Visit Milinda Pointer, MD [facility]  Showing recent visits within past 90 days and meeting all other requirements Today's Visits Date Type Provider Dept  05/09/21 Telemedicine Milinda Pointer, MD [facility]  Showing today's  visits and meeting all other requirements Future Appointments Date Type Provider Dept  06/14/21 Appointment Milinda Pointer, MD [facility]  Showing future appointments within next 90 days and meeting all other requirements I discussed the assessment and treatment plan with the patient. The patient was provided an opportunity to ask questions and all were answered. The patient agreed with the plan and demonstrated an understanding of the instructions.  Patient advised to call back or seek an in-person evaluation if the symptoms or condition worsens.  Duration of encounter: 15 minutes.  Note by: Gaspar Cola, MD Date: 05/09/2021; Time: 4:06 PM

## 2021-05-15 ENCOUNTER — Encounter: Payer: Medicare PPO | Admitting: Pain Medicine

## 2021-05-18 ENCOUNTER — Telehealth: Payer: Self-pay

## 2021-05-18 DIAGNOSIS — G894 Chronic pain syndrome: Secondary | ICD-10-CM

## 2021-05-18 MED ORDER — HYDROCODONE-ACETAMINOPHEN 10-325 MG PO TABS: 1.0000 | ORAL_TABLET | Freq: Three times a day (TID) | ORAL | 0 refills | Status: DC | PRN

## 2021-05-18 NOTE — Telephone Encounter (Signed)
Patient notified

## 2021-05-18 NOTE — Telephone Encounter (Signed)
Pt requesting Hydrocodone sent to [facility] on [address] in [place].. [facility] not know [facility] it will be in please call and update pt when prescription have been sent

## 2021-05-18 NOTE — Telephone Encounter (Signed)
Dr Cherylann Ratel,  This patient went to fill her Hydrocodone 10-325 mg from Dr Laban Emperor today.  [facility] [place] [facility]  I called to confirm this and they said it is on back order and they will not have it anytime soon. [facility] [place] [facility] and I was wondering if you would resend her the #65 tablets to [facility] so that she can get the remaining pills.  I will put the [facility] in her pharmacy so that will be done.  Please let me know, thanks.

## 2021-06-12 NOTE — Progress Notes (Deleted)
PROVIDER NOTE: Information contained herein reflects review and annotations entered in association with encounter. Interpretation of such information and data should be left to medically-trained personnel. Information provided to patient can be located elsewhere in the medical record under "Patient Instructions". Document created using STT-dictation technology, any transcriptional errors that may result from process are unintentional.    Patient: Mariah Poole  Service Category: E/M  Provider: Gaspar Cola, MD  DOB: 02/25/1946  DOS: 06/14/2021  Specialty: Interventional Pain Management  MRN: [id number]  Setting: Ambulatory outpatient  PCP: Marguerita Merles, MD  Type: Established Patient    Referring Provider: Marguerita Merles, MD  Location: Office  Delivery: Face-to-face     HPI  Mariah Poole, a 75 y.o. year old female, is here today because of her No primary diagnosis found.. Mariah Poole's primary complain today is No chief complaint on file. Last encounter: My last encounter with her was on 02/15/2021. 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, L4-5, L5-S1) w/ neurogenic claudication; Lumbar foraminal stenosis (Multilevel) (Bilateral); Cervicalgia; DDD (degenerative disc disease), cervical; Cervical facet hypertrophy; Cervical facet joint syndrome; Lumbar facet hypertrophy; 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 Pain Assessment: Severity of   is reported as a  /10. Location:    / . Onset:  . Quality:  . Timing:  . Modifying factor(s):  Marland Kitchen Vitals:  vitals were not taken for this visit.   Reason for encounter:  ***   ***  Pharmacotherapy Assessment  Analgesic: Hydrocodone/APAP 10/325, 1 tab PO q 8 hrs PRN (30 mg/day of hydrocodone) (975 mg/day of acetaminophen) MME/day: 30 mg/day.   Monitoring: [place] PMP: PDMP reviewed during this encounter.       Pharmacotherapy: No side-effects or adverse reactions reported. Compliance: No problems identified. Effectiveness: Clinically acceptable.  No notes on file  UDS:  Summary  Date Value Ref Range Status  05/25/2020 Note  Final    Comment:    ==================================================================== [facility] Select 13 (MW) ==================================================================== Test                             Result       Flag       Units  Drug Present and Declared for Prescription Verification   Hydrocodone                    2868         EXPECTED   ng/mg creat   Hydromorphone                  300          EXPECTED   ng/mg creat   Dihydrocodeine                 623          EXPECTED   ng/mg  creat   Norhydrocodone                 2459         EXPECTED   ng/mg creat    Sources of hydrocodone include scheduled prescription medications.    Hydromorphone, dihydrocodeine and norhydrocodone are expected    metabolites of hydrocodone. Hydromorphone and dihydrocodeine are    also available as scheduled prescription medications.  ==================================================================== Test                      Result    Flag   Units      Ref Range   Creatinine              22               mg/dL       >=20 ==================================================================== Declared Medications:  The flagging and interpretation on this report are based on the  following declared medications.  Unexpected results may arise from  inaccuracies in the declared medications.   **Note: The testing scope of this panel includes these medications:   Hydrocodone (Norco)   **Note: The testing scope of this panel does not include the  following reported medications:   Acetaminophen (Norco)  Albuterol (Ventolin HFA)  Atenolol (Tenormin)  Budesonide (Symbicort)  Duloxetine (Cymbalta)  Formoterol (Symbicort)  Furosemide (Lasix)  Iron  Losartan (Cozaar)  Magnesium (Mag-Ox)  Meloxicam (Mobic)  Pantoprazole (Protonix)  Potassium (Klor-Con)  Pravastatin (Pravachol)  Pregabalin (Lyrica)  Spironolactone (Aldactone)  Turmeric  Vitamin C  Vitamin D3 ==================================================================== For clinical consultation, please call [phone]. ====================================================================      ROS  Constitutional: Denies any fever or chills Gastrointestinal: No reported hemesis, hematochezia, vomiting, or acute GI distress Musculoskeletal: Denies any acute onset joint swelling, redness, loss of ROM, or weakness Neurological: No reported episodes of acute onset apraxia, aphasia, dysarthria, agnosia, amnesia, paralysis, loss of coordination, or loss of consciousness  Medication Review  DULoxetine, HYDROcodone-acetaminophen, Turmeric Curcumin, Vitamin D3, albuterol, atenolol, budesonide-formoterol, ferrous sulfate, furosemide, losartan, magnesium oxide, meloxicam, pantoprazole, potassium chloride SA, pravastatin, pregabalin, spironolactone, and vitamin C  History Review  Allergy: Mariah Poole is allergic to contrast media [iodinated diagnostic agents], ioxaglate, and shellfish allergy. Drug: Mariah Poole  reports current drug use. Alcohol:   reports no history of alcohol use. Tobacco:  reports that she has never smoked. She has never used smokeless tobacco. Social: Mariah Poole  reports that she has never smoked. She has never used smokeless tobacco. She reports current drug use. She reports that she does not drink alcohol. Medical:  has a past medical history of Anemia, Arthritis, COPD (chronic obstructive pulmonary disease) ([place]), Depression, Diabetes mellitus without complication ([place]), GERD (gastroesophageal reflux disease), Hiatal hernia, History of contrast media allergy. (IVP dye) (09/01/2015), History of hiatal hernia, Hypercholesteremia, Hypertension, Morbid obesity ([place]), Scoliosis, and Shortness of breath dyspnea. Surgical: Mariah Poole  has a past surgical history that includes Abdominal hysterectomy; Cataract extraction w/ intraocular lens implant (Right); Gastric bypass; Hernia repair; Cataract extraction w/PHACO (Left, 07/19/2015); Cholecystectomy; and Hernia repair. Family: family history includes Diabetes in her mother; Heart disease in her father.  Laboratory Chemistry Profile   Renal Lab Results  Component Value Date   BUN 24 06/17/2019   CREATININE 1.17 (H) 06/17/2019   BCR 21 06/17/2019   GFRAA 53 (L) 06/17/2019   GFRNONAA 46 (L) 06/17/2019    Hepatic Lab Results  Component Value Date   AST 15 06/17/2019  ALT 9 05/20/2017   ALBUMIN 4.3 06/17/2019   ALKPHOS 101 06/17/2019   LIPASE 19 10/27/2015    Electrolytes Lab Results  Component Value Date   NA 141 06/17/2019   K 3.9 06/17/2019   CL 103 06/17/2019   CALCIUM 9.3 06/17/2019   MG 1.9 06/17/2019    Bone Lab Results  Component Value Date   25OHVITD1 45 06/17/2019   25OHVITD2 <1.0 06/17/2019   25OHVITD3 45 06/17/2019    Inflammation (CRP: Acute Phase) (ESR: Chronic Phase) Lab Results  Component Value Date   CRP 7 06/17/2019   ESRSEDRATE 49 (H) 06/17/2019   LATICACIDVEN 4.0 (H) 11/23/2009         Note: Above Lab results reviewed.  Recent  Imaging Review  NM Myocar Multi W/Spect W/Wall Motion / EF  Blood pressure demonstrated a normal response to exercise.  There was no ST segment deviation noted during stress.  The study is normal.  This is a low risk study.  The left ventricular ejection fraction is normal (55-65%).   Note: Reviewed        Physical Exam  General appearance: Well nourished, well developed, and well hydrated. In no apparent acute distress Mental status: Alert, oriented x 3 (person, place, & time)       Respiratory: No evidence of acute respiratory distress Eyes: PERLA Vitals: There were no vitals taken for this visit. BMI: Estimated body mass index is 39.06 kg/m as calculated from the following:   Height as of 02/15/21: 5' (1.524 m).   Weight as of 02/15/21: 200 lb (90.7 kg). Ideal: Patient weight not recorded  Assessment   Status Diagnosis  Controlled Controlled Controlled No diagnosis found.   Updated Problems: No problems updated.  Plan of Care  Problem-specific:  No problem-specific Assessment & Plan notes found for this encounter.  Mariah Poole has a current medication list which includes the following long-term medication(s): albuterol, atenolol, budesonide-formoterol, duloxetine, ferosul, furosemide, hydrocodone-acetaminophen, magnesium oxide, meloxicam, pantoprazole, potassium chloride sa, pravastatin, pregabalin, spironolactone, and symbicort.  Pharmacotherapy (Medications Ordered): No orders of the defined types were placed in this encounter.  Orders:  No orders of the defined types were placed in this encounter.  Follow-up plan:   No follow-ups on file.     Considering:   Possible bilateral lumbar facet RFA    Palliative PRN treatment(s):   Palliative bilateral lumbar facet blocks (#3)  Palliative intra-articular hip joint injection  Palliative LESIs              Recent Visits Date Type Provider Dept  05/09/21 Telemedicine Milinda Pointer, MD [facility]  Showing recent visits within past 90 days and meeting all other requirements Future Appointments Date Type Provider Dept  06/14/21 Appointment Milinda Pointer, MD [facility]  Showing future appointments within next 90 days and meeting all other requirements I discussed the assessment and treatment plan with the patient. The patient was provided an opportunity to ask questions and all were answered. The patient agreed with the plan and demonstrated an understanding of the instructions.  Patient advised to call back or seek an in-person evaluation if the symptoms or condition worsens.  Duration of encounter: *** minutes.  Note by: Gaspar Cola, MD Date: 06/14/2021; Time: 6:20 PM

## 2021-06-13 NOTE — Telephone Encounter (Signed)
She called back, she has been exposed to covid so I changed her to a vv tomorrow

## 2021-06-14 ENCOUNTER — Other Ambulatory Visit: Payer: Self-pay

## 2021-06-14 ENCOUNTER — Encounter: Payer: Medicare PPO | Admitting: Pain Medicine

## 2021-06-14 ENCOUNTER — Ambulatory Visit: Payer: Medicare PPO | Attending: Pain Medicine | Admitting: Pain Medicine

## 2021-06-14 DIAGNOSIS — G894 Chronic pain syndrome: Secondary | ICD-10-CM | POA: Diagnosis not present

## 2021-06-14 DIAGNOSIS — M47812 Spondylosis without myelopathy or radiculopathy, cervical region: Secondary | ICD-10-CM

## 2021-06-14 DIAGNOSIS — Z79899 Other long term (current) drug therapy: Secondary | ICD-10-CM

## 2021-06-14 DIAGNOSIS — Z79891 Long term (current) use of opiate analgesic: Secondary | ICD-10-CM

## 2021-06-14 DIAGNOSIS — M545 Low back pain, unspecified: Secondary | ICD-10-CM | POA: Diagnosis not present

## 2021-06-14 DIAGNOSIS — G8929 Other chronic pain: Secondary | ICD-10-CM

## 2021-06-14 DIAGNOSIS — M25551 Pain in right hip: Secondary | ICD-10-CM

## 2021-06-14 MED ORDER — HYDROCODONE-ACETAMINOPHEN 10-325 MG PO TABS: 1.0000 | ORAL_TABLET | Freq: Three times a day (TID) | ORAL | 0 refills | Status: DC

## 2021-06-14 NOTE — Progress Notes (Signed)
Patient: Mariah Poole  Service Category: E/M  Provider: Gaspar Cola, MD  DOB: December 20, 1945  DOS: 06/14/2021  Location: Office  MRN: [id number]  Setting: Ambulatory outpatient  Referring Provider: Marguerita Merles, MD  Type: Established Patient  Specialty: Interventional Pain Management  PCP: Marguerita Merles, MD  Location: Remote location  Delivery: TeleHealth     Virtual Encounter - Pain Management PROVIDER NOTE: Information contained herein reflects review and annotations entered in association with encounter. Interpretation of such information and data should be left to medically-trained personnel. Information provided to patient can be located elsewhere in the medical record under "Patient Instructions". Document created using STT-dictation technology, any transcriptional errors that may result from process are unintentional.    Contact & Pharmacy Preferred: [phone] Home: [phone] (home) Mobile: [phone] (mobile) E-mail: [email or web address]  [id number]: [phone]: [phone]  Pre-screening  [name] offered "in-person" vs "virtual" encounter. She indicated preferring virtual for this encounter.   Reason COVID-19*  Social distancing based on CDC and AMA recommendations.   I contacted EKela BaccariTroxler on 06/14/2021 via telephone.      I clearly identified myself as FGaspar Cola MD. I verified that I was speaking with the correct person using two identifiers (Name: ELAKEYN DOKKEN and date of birth: 603-01-1946.  Consent I sought verbal advanced consent from EWynona Dovefor virtual visit interactions. I informed Ms. Panjwani of possible security and privacy concerns, risks, and limitations associated with providing "not-in-person" medical evaluation and management services. I also informed Ms. Larmon of the availability of "in-person" appointments. Finally, I  informed her that there would be a charge for the virtual visit and that she could be  personally, fully or partially, financially responsible for it. Ms. TDonaexpressed understanding and agreed to proceed.   Historic Elements   Ms. ENECOLA BLUESTEINis a 75y.o. year old, female patient evaluated today after our last contact on 02/15/2021. [name] has a past medical history of Anemia, Arthritis, COPD (chronic obstructive pulmonary disease) ([place], Depression, Diabetes mellitus without complication ([place], GERD (gastroesophageal reflux disease), Hiatal hernia, History of contrast media allergy. (IVP dye) (09/01/2015), History of hiatal hernia, Hypercholesteremia, Hypertension, Morbid obesity ([place], Scoliosis, and Shortness of breath dyspnea. She also  has a past surgical history that includes Abdominal hysterectomy; Cataract extraction w/ intraocular lens implant (Right); Gastric bypass; Hernia repair; Cataract extraction w/PHACO (Left, 07/19/2015); Cholecystectomy; and Hernia repair. Ms. TAldamahas a current medication list which includes the following prescription(s): albuterol, atenolol, budesonide-formoterol, vitamin d3, duloxetine, ferosul, furosemide, losartan, magnesium oxide, meloxicam, pantoprazole, potassium chloride sa, pravastatin, pregabalin, spironolactone, symbicort, turmeric curcumin, vitamin c, and [START ON 06/17/2021] hydrocodone-acetaminophen. She  reports that she has never smoked. She has never used smokeless tobacco. She reports current drug use. She reports that she does not drink alcohol. Ms. TEnckis allergic to contrast media [iodinated diagnostic agents], ioxaglate, and shellfish allergy.   HPI  Today, she is being contacted for medication management.  This patient was last seen face-to-face on 02/15/2021.  On that visit, we requested for the patient to return around 05/18/2021 for a face-to-face medication management visit.  On 05/09/2021 she was contacted through a virtual visit.   On that visit, she was scheduled to return around 06/17/2021 for a face-to-face medication management evaluation.  Today 06/14/2021 she is contacted through a virtual visit since she indicated having been  exposed to [place] and wanting to change the visit to a virtual.  Reviewing the chart, it would seem that she received a prescription for hydrocodone/APAP from Dr. Holley Raring, on 05/18/2021, while covering for me on my PTO.  According to the PMP she had a prescription failed on 04/19/2021 for 90 pills, on 05/18/2021 for 25 pills, and again on 05/18/2021 for 65 pills.  However, on my 05/09/2021 note there is evidence that I sent a prescription for 90 pills on 05/09/2021.  Today I reviewed the historical medication section of the electronic chart and clearly all of my prescriptions were for 90 pills, which tells me that this was an issue with the pharmacy not having enough medications to fill her prescription.  However, according to [facility], the pharmacy should have failed the remainder of her pills without requiring an additional prescription, if this was done within 72 hours of the initial fill.  I also did notice in her PMP that the prescription for 25 pills was paid using Medicare and the remaining 65 were purchased using private pay.  Today I had a very long conversation with the patient with regards to this issue of her prescriptions.  I reminded her that she would have not had this problem and she had read the information that I provided to her regarding recommendations associated to the pain medication.  These recommendations clearly stated never to accept any partial filled on any of her pain medications since this would require for the rest of the prescription to be filled within 72 hours and if the medication is not available within that time, then the pharmacist would be asking her to get a second prescription from [place] which we will not be providing.  I reminded the patient how fortunate she was that when  she called I was not here and Dr. Holley Raring did provide her with the prescription which I would have not done since she was supposed to have followed all rules and regulations.  She is now well aware of this and admits that it was her fault.  I also provided her with information regarding what to do when this happens and the fact that she needs to call 24 hours in advance, prior to 12 noon, the day prior to begin up her prescriptions to find out whether or not the pharmacy has enough.  If they tell her that they do not have enough, she was instructed to request that a carrier be assigned to getting these medications from another one of their pharmacies and bring them to her pharmacy so that her prescription can be filled.  These are very specific recommendations given to [place] by the supervisors for all of [facility] pharmacies and [employer], which I had to get in contact with due to the same problem.  Their recommendation was, as stated above, to contact the pharmacy 24 hours in advance, before noon, to find out whether or not they have enough medication to fill her prescription and if not, then a carrier can be requested.   The patient indicates doing well with the current medication regimen. No adverse reactions or side effects reported to the medications.   RTCB: 07/17/2021 Nonopioids transferred 11/09/2020: Magnesium, Lyrica, and Mobic.  Pharmacotherapy Assessment   Analgesic: Hydrocodone/APAP 10/325, 1 tab PO q 8 hrs PRN (30 mg/day of hydrocodone) (975 mg/day of acetaminophen) MME/day: 30 mg/day.   Monitoring: [place] PMP: PDMP not reviewed this encounter.       Pharmacotherapy: No  side-effects or adverse reactions reported. Compliance: No problems identified. Effectiveness: Clinically acceptable. Plan: Refer to "POC". UDS:  Summary  Date Value Ref Range Status  05/25/2020 Note  Final    Comment:    ==================================================================== [facility]  Select 13 (MW) ==================================================================== Test                             Result       Flag       Units  Drug Present and Declared for Prescription Verification   Hydrocodone                    2868         EXPECTED   ng/mg creat   Hydromorphone                  300          EXPECTED   ng/mg creat   Dihydrocodeine                 623          EXPECTED   ng/mg creat   Norhydrocodone                 2459         EXPECTED   ng/mg creat    Sources of hydrocodone include scheduled prescription medications.    Hydromorphone, dihydrocodeine and norhydrocodone are expected    metabolites of hydrocodone. Hydromorphone and dihydrocodeine are    also available as scheduled prescription medications.  ==================================================================== Test                      Result    Flag   Units      Ref Range   Creatinine              22               mg/dL      >=20 ==================================================================== Declared Medications:  The flagging and interpretation on this report are based on the  following declared medications.  Unexpected results may arise from  inaccuracies in the declared medications.   **Note: The testing scope of this panel includes these medications:   Hydrocodone (Norco)   **Note: The testing scope of this panel does not include the  following reported medications:   Acetaminophen (Norco)  Albuterol (Ventolin HFA)  Atenolol (Tenormin)  Budesonide (Symbicort)  Duloxetine (Cymbalta)  Formoterol (Symbicort)  Furosemide (Lasix)  Iron  Losartan (Cozaar)  Magnesium (Mag-Ox)  Meloxicam (Mobic)  Pantoprazole (Protonix)  Potassium (Klor-Con)  Pravastatin (Pravachol)  Pregabalin (Lyrica)  Spironolactone (Aldactone)  Turmeric  Vitamin C  Vitamin D3 ==================================================================== For clinical consultation, please call [phone]. ====================================================================      Laboratory Chemistry Profile   Renal Lab Results  Component Value Date   BUN 24 06/17/2019   CREATININE 1.17 (H) 06/17/2019   BCR 21 06/17/2019   GFRAA 53 (L) 06/17/2019   GFRNONAA 46 (L) 06/17/2019    Hepatic Lab Results  Component Value Date   AST 15 06/17/2019   ALT 9 05/20/2017   ALBUMIN 4.3 06/17/2019   ALKPHOS 101 06/17/2019   LIPASE 19 10/27/2015    Electrolytes Lab Results  Component Value Date   NA 141 06/17/2019   K 3.9 06/17/2019   CL 103 06/17/2019   CALCIUM 9.3 06/17/2019   MG 1.9 06/17/2019    Bone Lab  Results  Component Value Date   25OHVITD1 45 06/17/2019   25OHVITD2 <1.0 06/17/2019   25OHVITD3 45 06/17/2019    Inflammation (CRP: Acute Phase) (ESR: Chronic Phase) Lab Results  Component Value Date   CRP 7 06/17/2019   ESRSEDRATE 49 (H) 06/17/2019   LATICACIDVEN 4.0 (H) 11/23/2009         Note: Above Lab results reviewed.  Imaging  NM Myocar Multi W/Spect W/Wall Motion / EF  Blood pressure demonstrated a normal response to exercise.  There was no ST segment deviation noted during stress.  The study is normal.  This is a low risk study.  The left ventricular ejection fraction is normal (55-65%).    Assessment  The primary encounter diagnosis was Chronic pain syndrome. Diagnoses of Pharmacologic therapy, Chronic use of opiate for therapeutic purpose, Encounter for medication management, Chronic low back pain (1ry area of Pain) (Bilateral) (R>L) w/o sciatica, Chronic hip pain (Right), and Cervical facet joint syndrome were also pertinent to this visit.  Plan of Care  Problem-specific:  No problem-specific Assessment & Plan notes found for this encounter.  Ms. OLAMAE FERRARA has a current medication list which includes the following long-term medication(s): albuterol, atenolol, budesonide-formoterol, duloxetine, ferosul, furosemide, magnesium oxide,  meloxicam, pantoprazole, potassium chloride sa, pravastatin, pregabalin, spironolactone, symbicort, and [START ON 06/17/2021] hydrocodone-acetaminophen.  Pharmacotherapy (Medications Ordered): Meds ordered this encounter  Medications   HYDROcodone-acetaminophen (NORCO) 10-325 MG tablet    Sig: Take 1 tablet by mouth every 8 (eight) hours. Must last 30 days    Dispense:  90 tablet    Refill:  0    Not a duplicate. Do NOT delete! Dispense 1 day early if closed on refill date. Avoid benzodiazepines within 8 hours of opioids. Do not send refill requests.    Orders:  No orders of the defined types were placed in this encounter.  Follow-up plan:   Return in about 1 month (around 07/17/2021) for evaluation day (F2F) (MM).      Considering:   Possible bilateral lumbar facet RFA    Palliative PRN treatment(s):   Palliative bilateral lumbar facet blocks (#3)  Palliative intra-articular hip joint injection  Palliative LESIs     Recent Visits Date Type Provider Dept  05/09/21 Telemedicine Milinda Pointer, MD [facility]  Showing recent visits within past 90 days and meeting all other requirements Today's Visits Date Type Provider Dept  06/14/21 Telemedicine Milinda Pointer, MD [facility]  Showing today's visits and meeting all other requirements Future Appointments No visits were found meeting these conditions. Showing future appointments within next 90 days and meeting all other requirements I discussed the assessment and treatment plan with the patient. The patient was provided an opportunity to ask questions and all were answered. The patient agreed with the plan and demonstrated an understanding of the instructions.  Patient advised to call back or seek an in-person evaluation if the symptoms or condition worsens.  Duration of encounter: 35 minutes.  Note by: Gaspar Cola, MD Date: 06/14/2021; Time: 8:36 PM

## 2021-07-17 NOTE — Progress Notes (Signed)
PROVIDER NOTE: Information contained herein reflects review and annotations entered in association with encounter. Interpretation of such information and data should be left to medically-trained personnel. Information provided to patient can be located elsewhere in the medical record under "Patient Instructions". Document created using STT-dictation technology, any transcriptional errors that may result from process are unintentional.    Patient: Margret Chance Ortega  Service Category: E/M  Provider: Gaspar Cola, MD  DOB: [birth year]  DOS: 07/18/2021  Specialty: Interventional Pain Management  MRN: [id number]  Setting: Ambulatory outpatient  PCP: Marguerita Merles, MD  Type: Established Patient    Referring Provider: Marguerita Merles, MD  Location: Office  Delivery: Face-to-face     HPI  Ms. [name], a 75 y.o. year old female, is here today because of her Chronic pain syndrome [G89.4]. Ms. Armon primary complain today is Back Pain Last encounter: My last encounter with her was on 02/15/2021. Pertinent problems: Ms. 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, L4-5, L5-S1) w/ neurogenic claudication; Lumbar foraminal stenosis (Multilevel) (Bilateral); Cervicalgia; DDD (degenerative disc disease), cervical; Cervical facet hypertrophy; Cervical facet joint syndrome; Lumbar facet hypertrophy; 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 Pain Assessment: Severity of Chronic pain is reported as a 0-No pain/10. Location: Back Lower, Right/Radiates from right back into the right hip into the right knee. Onset: More than a month ago. Quality: Constant, Pins and needles, Nagging, Numbness. Timing: Constant. Modifying factor(s): Sitting, resting. Vitals:  height is 5' (1.524 m) and weight is 210 lb (95.3 kg). Her temporal temperature is 97.1 F (36.2 C) (abnormal). Her blood pressure is 135/53 (abnormal) and her pulse is 85. Her respiration is 16 and oxygen saturation is 97%.   Reason for encounter: medication management.   The patient indicates doing well with the current medication regimen. No adverse reactions or side effects reported to the medications.   Swayze comes into the clinics today on a wheelchair, very short of breath and having gained quite a bit of weight back.  Unfortunately, she describes that both of these are secondary to her having contracted COVID again.  She is still very short of breath and she refers that this has been the case since she had COVID.  Of course, having gained weight back has not helped.  UDS ordered today.  T  he patient did not bring her bottles or pills for the pill count today.  Today I have given her a final warning regarding this.  She has been coming to our practice long enough to know that it is a requirement that all patients bring their bottles for inspection and pill counts.  Today I have again provided her with our written policy regarding this and I have informed the patient that should this happen again, she will not be receiving another refill on her medications, until she is compliant.  She understood and accepted.  RTCB: 10/16/2021 Nonopioids transferred  11/09/2020: Magnesium, Lyrica, and Mobic.  Pharmacotherapy Assessment   Analgesic: Hydrocodone/APAP 10/325, 1 tab PO q 8 hrs PRN (30 mg/day of hydrocodone) (975 mg/day of acetaminophen) MME/day: 30 mg/day.   Monitoring: [place] PMP: PDMP reviewed during this encounter.       Pharmacotherapy: No side-effects or adverse reactions reported. Compliance: No problems identified. Effectiveness: Clinically acceptable.  Janne Napoleon, RN  07/18/2021  2:45 PM  Sign when Signing Visit Safety precautions to be maintained throughout the outpatient stay will include: orient to surroundings, keep bed in low position, maintain call bell within reach at all times, provide assistance with transfer out of bed and ambulation.   Nursing Pain Medication Assessment:  Safety precautions to be maintained throughout the outpatient stay will include: orient to surroundings, keep bed in low position, maintain call bell within reach at all times, provide assistance with transfer out of bed and ambulation.  Medication Inspection Compliance: Ms. Heatherly did not comply with our request to bring her pills to be counted. She was reminded that bringing the medication bottles, even when empty, is a requirement.  Medication: None brought in. Pill/Patch Count: None available to be counted. Bottle Appearance: No container available. Did not bring bottle(s) to appointment. Filled Date: N/A Last Medication intake:  Today     UDS:  Summary  Date Value Ref Range Status  05/25/2020 Note  Final    Comment:    ==================================================================== [employer]ox[employer] Select 13 (MW) ==================================================================== Test                             Result       Flag       Units  Drug Present and Declared for Prescription Verification   Hydrocodone                    2868         EXPECTED   ng/mg creat   Hydromorphone                  300          EXPECTED   ng/mg creat   Dihydrocodeine                 623          EXPECTED   ng/mg creat    Norhydrocodone                 2459         EXPECTED   ng/mg creat    Sources of hydrocodone include scheduled prescription medications.    Hydromorphone, dihydrocodeine and norhydrocodone are expected    metabolites of hydrocodone. Hydromorphone and dihydrocodeine are    also available as scheduled prescription medications.  ==================================================================== Test                      Result    Flag   Units      Ref Range   Creatinine              22               mg/dL      >=20 ==================================================================== Declared Medications:  The flagging and interpretation on this report are based on the  following declared medications.  Unexpected results may arise from  inaccuracies in the declared medications.   **Note: The testing scope of this panel includes these medications:  Hydrocodone (Norco)   **Note: The testing scope of this panel does not include the  following reported medications:   Acetaminophen (Norco)  Albuterol (Ventolin HFA)  Atenolol (Tenormin)  Budesonide (Symbicort)  Duloxetine (Cymbalta)  Formoterol (Symbicort)  Furosemide (Lasix)  Iron  Losartan (Cozaar)  Magnesium (Mag-Ox)  Meloxicam (Mobic)  Pantoprazole (Protonix)  Potassium (Klor-Con)  Pravastatin (Pravachol)  Pregabalin (Lyrica)  Spironolactone (Aldactone)  Turmeric  Vitamin C  Vitamin D3 ==================================================================== For clinical consultation, please call [phone]. ====================================================================      ROS  Constitutional: Denies any fever or chills Gastrointestinal: No reported hemesis, hematochezia, vomiting, or acute GI distress Musculoskeletal: Denies any acute onset joint swelling, redness, loss of ROM, or weakness Neurological: No reported episodes of acute onset apraxia, aphasia, dysarthria, agnosia, amnesia, paralysis, loss of  coordination, or loss of consciousness  Medication Review  DULoxetine, HYDROcodone-acetaminophen, Turmeric Curcumin, Vitamin D3, albuterol, atenolol, budesonide-formoterol, ferrous sulfate, furosemide, losartan, magnesium oxide, meloxicam, pantoprazole, potassium chloride SA, pravastatin, pregabalin, spironolactone, and vitamin C  History Review  Allergy: Ms. Telford is allergic to contrast media [iodinated diagnostic agents], ioxaglate, and shellfish allergy. Drug: Ms. Trawick  reports current drug use. Alcohol:  reports no history of alcohol use. Tobacco:  reports that she has never smoked. She has never used smokeless tobacco. Social: Ms. Winward  reports that she has never smoked. She has never used smokeless tobacco. She reports current drug use. She reports that she does not drink alcohol. Medical:  has a past medical history of Anemia, Arthritis, COPD (chronic obstructive pulmonary disease) ([place]), Depression, Diabetes mellitus without complication ([place]), GERD (gastroesophageal reflux disease), Hiatal hernia, History of contrast media allergy. (IVP dye) (09/01/2015), History of hiatal hernia, Hypercholesteremia, Hypertension, Morbid obesity ([place]), Scoliosis, and Shortness of breath dyspnea. Surgical: Ms. Crockett  has a past surgical history that includes Abdominal hysterectomy; Cataract extraction w/ intraocular lens implant (Right); Gastric bypass; Hernia repair; Cataract extraction w/PHACO (Left, 07/19/2015); Cholecystectomy; and Hernia repair. Family: family history includes Diabetes in her mother; Heart disease in her father.  Laboratory Chemistry Profile   Renal Lab Results  Component Value Date   BUN 24 06/17/2019   CREATININE 1.17 (H) 06/17/2019   BCR 21 06/17/2019   GFRAA 53 (L) 06/17/2019   GFRNONAA 46 (L) 06/17/2019    Hepatic Lab Results  Component Value Date   AST 15 06/17/2019   ALT 9 05/20/2017   ALBUMIN 4.3 06/17/2019   ALKPHOS 101 06/17/2019   LIPASE 19 10/27/2015     Electrolytes Lab Results  Component Value Date   NA 141 06/17/2019   K 3.9 06/17/2019   CL 103 06/17/2019   CALCIUM 9.3 06/17/2019   MG 1.9 06/17/2019    Bone Lab Results  Component Value Date   25OHVITD1 45 06/17/2019   25OHVITD2 <1.0 06/17/2019   25OHVITD3 45 06/17/2019    Inflammation (CRP: Acute Phase) (ESR: Chronic Phase) Lab Results  Component Value Date   CRP 7 06/17/2019   ESRSEDRATE 49 (H) 06/17/2019   LATICACIDVEN 4.0 (H) 11/23/2009         Note: Above Lab results reviewed.  Recent Imaging Review  NM Myocar Multi W/Spect W/Wall Motion / EF  Blood pressure demonstrated a normal response to exercise.  There was no ST segment deviation noted during stress.  The study is normal.  This is a low risk study.  The left ventricular ejection fraction is normal (55-65%).   Note: Reviewed        Physical Exam  General appearance: Well  nourished, well developed, and well hydrated. In no apparent acute distress Mental status: Alert, oriented x 3 (person, place, & time)       Respiratory: No evidence of acute respiratory distress Eyes: PERLA Vitals: BP (!) 135/53 (BP Location: Right Arm, Patient Position: Sitting, Cuff Size: Large)   Pulse 85   Temp (!) 97.1 F (36.2 C) (Temporal)   Resp 16   Ht 5' (1.524 m)   Wt 210 lb (95.3 kg)   SpO2 97%   BMI 41.01 kg/m  BMI: Estimated body mass index is 41.01 kg/m as calculated from the following:   Height as of this encounter: 5' (1.524 m).   Weight as of this encounter: 210 lb (95.3 kg). Ideal: Ideal body weight: 45.5 kg (100 lb 4.9 oz) Adjusted ideal body weight: 65.4 kg (144 lb 3 oz)  Assessment   Status Diagnosis  Controlled Controlled Controlled 1. Chronic pain syndrome   2. Chronic low back pain (1ry area of Pain) (Bilateral) (R>L) w/o sciatica   3. Chronic hip pain (Right)   4. Rheumatoid arthritis involving both hands with positive rheumatoid factor (H[facility])   5. Lumbar facet syndrome (Bilateral)  (R>L)   6. 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 Deformity of hand due to rheumatoid arthritis (H[facility])   9. Cervicalgia   10. Pharmacologic therapy   11. Chronic use of opiate for therapeutic purpose   12. Encounter for medication management      Updated Problems: No problems updated.  Plan of Care  Problem-specific:  No problem-specific Assessment & Plan notes found for this encounter.  Ms. DREYAH MONTROSE has a current medication list which includes the following long-term medication(s): albuterol, atenolol, budesonide-formoterol, duloxetine, ferosul, furosemide, hydrocodone-acetaminophen, [START ON 08/17/2021] hydrocodone-acetaminophen, [START ON 09/16/2021] hydrocodone-acetaminophen, pantoprazole, potassium chloride sa, pravastatin, symbicort, magnesium oxide, meloxicam, pregabalin, and spironolactone.  Pharmacotherapy (Medications Ordered): Meds ordered this encounter  Medications   HYDROcodone-acetaminophen (NORCO) 10-325 MG tablet    Sig: Take 1 tablet by mouth every 8 (eight) hours. Must last 30 days    Dispense:  90 tablet    Refill:  0    Not a duplicate. Do NOT delete! Dispense 1 day early if closed on refill date. Avoid benzodiazepines within 8 hours of opioids. Do not send refill requests.   HYDROcodone-acetaminophen (NORCO) 10-325 MG tablet    Sig: Take 1 tablet by mouth every 8 (eight) hours. Must last 30 days    Dispense:  90 tablet    Refill:  0    Not a duplicate. Do NOT delete! Dispense 1 day early if closed on refill date. Avoid benzodiazepines within 8 hours of opioids. Do not send refill requests.   HYDROcodone-acetaminophen (NORCO) 10-325 MG tablet    Sig: Take 1 tablet by mouth every 8 (eight) hours. Must last 30 days    Dispense:  90 tablet    Refill:  0    Not a duplicate. Do NOT delete! Dispense 1 day early if closed on refill date. Avoid benzodiazepines  within 8 hours of opioids. Do not send refill requests.    Orders:  Orders Placed This Encounter  Procedures   ToxASSU[employer] Select 13 (MW), Urine    Volume: 30 ml(s). Minimum 3 ml of urine is needed. Document temperature of fresh sample. Indications: Long term (current) use of opiate analgesic (S96.283)    Order Specific Question:   Release to  patient    Answer:   Immediate   Follow-up plan:   Return in about 3 months (around 10/16/2021) for Eval-day(M,W), (F2F), (MM).     Considering:   Possible bilateral lumbar facet RFA    Palliative PRN treatment(s):   Palliative bilateral lumbar facet blocks (#3)  Palliative intra-articular hip joint injection  Palliative LESIs      Recent Visits Date Type Provider Dept  06/14/21 Telemedicine Milinda Pointer, MD [facility]  05/09/21 Telemedicine Milinda Pointer, MD [facility]  Showing recent visits within past 90 days and meeting all other requirements Today's Visits Date Type Provider Dept  07/18/21 Office Visit Milinda Pointer, MD [facility]  Showing today's visits and meeting all other requirements Future Appointments Date Type Provider Dept  10/11/21 Appointment Milinda Pointer, MD [facility]  Showing future appointments within next 90 days and meeting all other requirements I discussed the assessment and treatment plan with the patient. The patient was provided an opportunity to ask questions and all were answered. The patient agreed with the plan and demonstrated an understanding of the instructions.  Patient advised to call back or seek an in-person evaluation if the symptoms or condition worsens.  Duration of encounter: 30 minutes.  Note by: Gaspar Cola, MD Date: 07/18/2021; Time: 3:29 PM

## 2021-07-18 ENCOUNTER — Other Ambulatory Visit: Payer: Self-pay

## 2021-07-18 ENCOUNTER — Encounter: Payer: Self-pay | Admitting: Pain Medicine

## 2021-07-18 ENCOUNTER — Ambulatory Visit: Payer: Medicare PPO | Attending: Pain Medicine | Admitting: Pain Medicine

## 2021-07-18 VITALS — BP 135/53 | HR 85 | Temp 97.1°F | Resp 16 | Ht 60.0 in | Wt 210.0 lb

## 2021-07-18 DIAGNOSIS — M48062 Spinal stenosis, lumbar region with neurogenic claudication: Secondary | ICD-10-CM | POA: Diagnosis present

## 2021-07-18 DIAGNOSIS — M069 Rheumatoid arthritis, unspecified: Secondary | ICD-10-CM | POA: Diagnosis present

## 2021-07-18 DIAGNOSIS — M05742 Rheumatoid arthritis with rheumatoid factor of left hand without organ or systems involvement: Secondary | ICD-10-CM | POA: Insufficient documentation

## 2021-07-18 DIAGNOSIS — M21949 Unspecified acquired deformity of hand, unspecified hand: Secondary | ICD-10-CM | POA: Diagnosis present

## 2021-07-18 DIAGNOSIS — M47816 Spondylosis without myelopathy or radiculopathy, lumbar region: Secondary | ICD-10-CM | POA: Diagnosis present

## 2021-07-18 DIAGNOSIS — G894 Chronic pain syndrome: Secondary | ICD-10-CM | POA: Insufficient documentation

## 2021-07-18 DIAGNOSIS — Z79891 Long term (current) use of opiate analgesic: Secondary | ICD-10-CM | POA: Insufficient documentation

## 2021-07-18 DIAGNOSIS — G8929 Other chronic pain: Secondary | ICD-10-CM | POA: Insufficient documentation

## 2021-07-18 DIAGNOSIS — Z79899 Other long term (current) drug therapy: Secondary | ICD-10-CM | POA: Diagnosis present

## 2021-07-18 DIAGNOSIS — M542 Cervicalgia: Secondary | ICD-10-CM | POA: Insufficient documentation

## 2021-07-18 DIAGNOSIS — M545 Low back pain, unspecified: Secondary | ICD-10-CM | POA: Diagnosis present

## 2021-07-18 DIAGNOSIS — M25551 Pain in right hip: Secondary | ICD-10-CM | POA: Insufficient documentation

## 2021-07-18 DIAGNOSIS — M05741 Rheumatoid arthritis with rheumatoid factor of right hand without organ or systems involvement: Secondary | ICD-10-CM | POA: Insufficient documentation

## 2021-07-18 DIAGNOSIS — M21969 Unspecified acquired deformity of unspecified lower leg: Secondary | ICD-10-CM | POA: Insufficient documentation

## 2021-07-18 MED ORDER — HYDROCODONE-ACETAMINOPHEN 10-325 MG PO TABS: 1.0000 | ORAL_TABLET | Freq: Three times a day (TID) | ORAL | 0 refills | Status: DC

## 2021-07-18 NOTE — Progress Notes (Signed)
Safety precautions to be maintained throughout the outpatient stay will include: orient to surroundings, keep bed in low position, maintain call bell within reach at all times, provide assistance with transfer out of bed and ambulation.   Nursing Pain Medication Assessment:  Safety precautions to be maintained throughout the outpatient stay will include: orient to surroundings, keep bed in low position, maintain call bell within reach at all times, provide assistance with transfer out of bed and ambulation.  Medication Inspection Compliance: Mariah Poole did not comply with our request to bring her pills to be counted. She was reminded that bringing the medication bottles, even when empty, is a requirement.  Medication: None brought in. Pill/Patch Count: None available to be counted. Bottle Appearance: No container available. Did not bring bottle(s) to appointment. Filled Date: N/A Last Medication intake:  Today

## 2021-07-18 NOTE — Patient Instructions (Signed)
____________________________________________________________________________________________  Medication Rules  Purpose: To inform patients, and their family members, of our rules and regulations.  Applies to: All patients receiving prescriptions (written or electronic).  Pharmacy of record: Pharmacy where electronic prescriptions will be sent. If written prescriptions are taken to a different pharmacy, please inform the nursing staff. The pharmacy listed in the electronic medical record should be the one where you would like electronic prescriptions to be sent.  Electronic prescriptions: In compliance with the [place] [employer]reng[employer] Opioid Misuse Prevention (STOP) Act of 2017 (Session Law 2017-74/H243), effective November 19, 2018, all controlled substances must be electronically prescribed. Calling prescriptions to the pharmacy will cease to exist.  Prescription refills: Only during scheduled appointments. Applies to all prescriptions.  NOTE: The following applies primarily to controlled substances (Opioid* Pain Medications).   Type of encounter (visit): For patients receiving controlled substances, face-to-face visits are required. (Not an option or up to the patient.)  Patient's responsibilities: Pain Pills: Bring all pain pills to every appointment (except for procedure appointments). Pill Bottles: Bring pills in original pharmacy bottle. Always bring the newest bottle. Bring bottle, even if empty. Medication refills: You are responsible for knowing and keeping track of what medications you take and those you need refilled. The day before your appointment: write a list of all prescriptions that need to be refilled. The day of the appointment: give the list to the admitting nurse. Prescriptions will be written only during appointments. No prescriptions will be written on procedure days. If you forget a medication: it will not be "Called in", "Faxed", or "electronically sent". You will  need to get another appointment to get these prescribed. No early refills. Do not call asking to have your prescription filled early. Prescription Accuracy: You are responsible for carefully inspecting your prescriptions before leaving our office. Have the discharge nurse carefully go over each prescription with you, before taking them home. Make sure that your name is accurately spelled, that your address is correct. Check the name and dose of your medication to make sure it is accurate. Check the number of pills, and the written instructions to make sure they are clear and accurate. Make sure that you are given enough medication to last until your next medication refill appointment. Taking Medication: Take medication as prescribed. When it comes to controlled substances, taking less pills or less frequently than prescribed is permitted and encouraged. Never take more pills than instructed. Never take medication more frequently than prescribed.  Inform other Doctors: Always inform, all of your healthcare providers, of all the medications you take. Pain Medication from other Providers: You are not allowed to accept any additional pain medication from any other Doctor or Healthcare provider. There are two exceptions to this rule. (see below) In the event that you require additional pain medication, you are responsible for notifying us, as stated below. Cough Medicine: Often these contain an opioid, such as codeine or hydrocodone. Never accept or take cough medicine containing these opioids if you are already taking an opioid* medication. The combination may cause respiratory failure and death. Medication Agreement: You are responsible for carefully reading and following our Medication Agreement. This must be signed before receiving any prescriptions from our practice. Safely store a copy of your signed Agreement. Violations to the Agreement will result in no further prescriptions. (Additional copies of our  Medication Agreement are available upon request.) Laws, Rules, & Regulations: All patients are expected to follow all Federal and State Laws, Statutes, Rules, & Regulations. Ignorance of   the Laws does not constitute a valid excuse.  Illegal drugs and Controlled Substances: The use of illegal substances (including, but not limited to marijuana and its derivatives) and/or the illegal use of any controlled substances is strictly prohibited. Violation of this rule may result in the immediate and permanent discontinuation of any and all prescriptions being written by our practice. The use of any illegal substances is prohibited. Adopted CDC guidelines & recommendations: Target dosing levels will be at or below 60 MME/day. Use of benzodiazepines** is not recommended.  Exceptions: There are only two exceptions to the rule of not receiving pain medications from other Healthcare Providers. Exception #1 (Emergencies): In the event of an emergency (i.e.: accident requiring emergency care), you are allowed to receive additional pain medication. However, you are responsible for: As soon as you are able, call our office [phone], at any time of the day or night, and leave a message stating your name, the date and nature of the emergency, and the name and dose of the medication prescribed. In the event that your call is answered by a member of our staff, make sure to document and save the date, time, and the name of the person that took your information.  Exception #2 (Planned Surgery): In the event that you are scheduled by another doctor or dentist to have any type of surgery or procedure, you are allowed (for a period no longer than 30 days), to receive additional pain medication, for the acute post-op pain. However, in this case, you are responsible for picking up a copy of our "Post-op Pain Management for Surgeons" handout, and giving it to your surgeon or dentist. This document is available at our office, and  does not require an appointment to obtain it. Simply go to our office during business hours (Monday-Thursday from 8:00 AM to 4:00 PM) (Friday 8:00 AM to 12:00 Noon) or if you have a scheduled appointment with us, prior to your surgery, and ask for it by name. In addition, you are responsible for: calling our office [phone], at any time of the day or night, and leaving a message stating your name, name of your surgeon, type of surgery, and date of procedure or surgery. Failure to comply with your responsibilities may result in termination of therapy involving the controlled substances.  *Opioid medications include: morphine, codeine, oxycodone, oxymorphone, hydrocodone, hydromorphone, meperidine, tramadol, tapentadol, buprenorphine, fentanyl, methadone. **Benzodiazepine medications include: diazepam (Valium), alprazolam (Xanax), clonazepam (Klonopine), lorazepam (Ativan), clorazepate (Tranxene), chlordiazepoxide (Librium), estazolam (Prosom), oxazepam (Serax), temazepam (Restoril), triazolam (Halcion) (Last updated: 10/17/2020) ____________________________________________________________________________________________  ____________________________________________________________________________________________  Medication Recommendations and Reminders  Applies to: All patients receiving prescriptions (written and/or electronic).  Medication Rules & Regulations: These rules and regulations exist for your safety and that of others. They are not flexible and neither are we. Dismissing or ignoring them will be considered "non-compliance" with medication therapy, resulting in complete and irreversible termination of such therapy. (See document titled "Medication Rules" for more details.) In all conscience, because of safety reasons, we cannot continue providing a therapy where the patient does not follow instructions.  Pharmacy of record:  Definition: This is the pharmacy where your electronic  prescriptions will be sent.  We do not endorse any particular pharmacy, however, we have experienced problems with [facility] not securing enough medication supply for the community. We do not restrict you in your choice of pharmacy. However, once we write for your prescriptions, we will NOT be re-sending more prescriptions to fix restricted supply problems   created by your pharmacy, or your insurance.  The pharmacy listed in the electronic medical record should be the one where you want electronic prescriptions to be sent. If you choose to change pharmacy, simply notify our nursing staff.  Recommendations: Keep all of your pain medications in a safe place, under lock and key, even if you live alone. We will NOT replace lost, stolen, or damaged medication. After you fill your prescription, take 1 week's worth of pills and put them away in a safe place. You should keep a separate, properly labeled bottle for this purpose. The remainder should be kept in the original bottle. Use this as your primary supply, until it runs out. Once it's gone, then you know that you have 1 week's worth of medicine, and it is time to come in for a prescription refill. If you do this correctly, it is unlikely that you will ever run out of medicine. To make sure that the above recommendation works, it is very important that you make sure your medication refill appointments are scheduled at least 1 week before you run out of medicine. To do this in an effective manner, make sure that you do not leave the office without scheduling your next medication management appointment. Always ask the nursing staff to show you in your prescription , when your medication will be running out. Then arrange for the receptionist to get you a return appointment, at least 7 days before you run out of medicine. Do not wait until you have 1 or 2 pills left, to come in. This is very poor planning and does not take into consideration that we may need to  cancel appointments due to bad weather, sickness, or emergencies affecting our staff. DO NOT ACCEPT A "Partial Fill": If for any reason your pharmacy does not have enough pills/tablets to completely fill or refill your prescription, do not allow for a "partial fill". The law allows the pharmacy to complete that prescription within 72 hours, without requiring a new prescription. If they do not fill the rest of your prescription within those 72 hours, you will need a separate prescription to fill the remaining amount, which we will NOT provide. If the reason for the partial fill is your insurance, you will need to talk to the pharmacist about payment alternatives for the remaining tablets, but again, DO NOT ACCEPT A PARTIAL FILL, unless you can trust your pharmacist to obtain the remainder of the pills within 72 hours.  Prescription refills and/or changes in medication(s):  Prescription refills, and/or changes in dose or medication, will be conducted only during scheduled medication management appointments. (Applies to both, written and electronic prescriptions.) No refills on procedure days. No medication will be changed or started on procedure days. No changes, adjustments, and/or refills will be conducted on a procedure day. Doing so will interfere with the diagnostic portion of the procedure. No phone refills. No medications will be "called into the pharmacy". No Fax refills. No weekend refills. No Holliday refills. No after hours refills.  Remember:  Business hours are:  Monday to Thursday 8:00 AM to 4:00 PM Provider's Schedule: Ramil Edgington, MD - Appointments are:  Medication management: Monday and Wednesday 8:00 AM to 4:00 PM Procedure day: Tuesday and Thursday 7:30 AM to 4:00 PM Bilal Lateef, MD - Appointments are:  Medication management: Tuesday and Thursday 8:00 AM to 4:00 PM Procedure day: Monday and Wednesday 7:30 AM to 4:00 PM (Last update:  06/08/2020) ____________________________________________________________________________________________  ____________________________________________________________________________________________  CBD (cannabidiol) WARNING    Applicable to: All individuals currently taking or considering taking CBD (cannabidiol) and, more important, all patients taking opioid analgesic controlled substances (pain medication). (Example: oxycodone; oxymorphone; hydrocodone; hydromorphone; morphine; methadone; tramadol; tapentadol; fentanyl; buprenorphine; butorphanol; dextromethorphan; meperidine; codeine; etc.)  Legal status: CBD remains a Schedule I drug prohibited for any use. CBD is illegal with one exception. In the [place], CBD has a limited Food and Drug Administration (FDA) approval for the treatment of two specific types of epilepsy disorders. Only one CBD product has been approved by the FDA for this purpose: "Epidiolex". FDA is aware that some companies are marketing products containing cannabis and cannabis-derived compounds in ways that violate the Federal Food, Drug and Cosmetic Act (FD&C Act) and that may put the health and safety of consumers at risk. The FDA, a Federal agency, has not enforced the CBD status since 2018.   Legality: Some manufacturers ship CBD products nationally, which is illegal. Often such products are sold online and are therefore available throughout the country. CBD is openly sold in head shops and health food stores in some states where such sales have not been explicitly legalized. Selling unapproved products with unsubstantiated therapeutic claims is not only a violation of the law, but also can put patients at risk, as these products have not been proven to be safe or effective. Federal illegality makes it difficult to conduct research on CBD.  Reference: "FDA Regulation of Cannabis and Cannabis-Derived Products, Including Cannabidiol (CBD)" -  https://www.fda.gov/news-events/public-health-focus/fda-regulation-cannabis-and-cannabis-derived-products-including-cannabidiol-cbd  Warning: CBD is not FDA approved and has not undergo the same manufacturing controls as prescription drugs.  This means that the purity and safety of available CBD may be questionable. Most of the time, despite manufacturer's claims, it is contaminated with THC (delta-9-tetrahydrocannabinol - the chemical in marijuana responsible for the "HIGH").  When this is the case, the THC contaminant will trigger a positive urine drug screen (UDS) test for Marijuana (carboxy-THC). Because a positive UDS for any illicit substance is a violation of our medication agreement, your opioid analgesics (pain medicine) may be permanently discontinued.  MORE ABOUT CBD  General Information: CBD  is a derivative of the Marijuana (cannabis sativa) plant discovered in 1940. It is one of the 113 identified substances found in Marijuana. It accounts for up to 40% of the plant's extract. As of 2018, preliminary clinical studies on CBD included research for the treatment of anxiety, movement disorders, and pain. CBD is available and consumed in multiple forms, including inhalation of smoke or vapor, as an aerosol spray, and by mouth. It may be supplied as an oil containing CBD, capsules, dried cannabis, or as a liquid solution. CBD is thought not to be as psychoactive as THC (delta-9-tetrahydrocannabinol - the chemical in marijuana responsible for the "HIGH"). Studies suggest that CBD may interact with different biological target receptors in the body, including cannabinoid and other neurotransmitter receptors. As of 2018 the mechanism of action for its biological effects has not been determined.  Side-effects  Adverse reactions: Dry mouth, diarrhea, decreased appetite, fatigue, drowsiness, malaise, weakness, sleep disturbances, and others.  Drug interactions: CBC may interact with other medications  such as blood-thinners. (Last update: 06/25/2020) ____________________________________________________________________________________________  ____________________________________________________________________________________________  Drug Holidays (Slow)  What is a "Drug Holiday"? Drug Holiday: is the name given to the period of time during which a patient stops taking a medication(s) for the purpose of eliminating tolerance to the drug.  Benefits Improved effectiveness of opioids. Decreased opioid dose needed to achieve benefits. Improved pain with lesser dose.    What is tolerance? Tolerance: is the progressive decreased in effectiveness of a drug due to its repetitive use. With repetitive use, the body gets use to the medication and as a consequence, it loses its effectiveness. This is a common problem seen with opioid pain medications. As a result, a larger dose of the drug is needed to achieve the same effect that used to be obtained with a smaller dose.  How long should a "Drug Holiday" last? You should stay off of the pain medicine for at least 14 consecutive days. (2 weeks)  Should I stop the medicine "cold turkey"? No. You should always coordinate with your Pain Specialist so that he/she can provide you with the correct medication dose to make the transition as smoothly as possible.  How do I stop the medicine? Slowly. You will be instructed to decrease the daily amount of pills that you take by one (1) pill every seven (7) days. This is called a "slow downward taper" of your dose. For example: if you normally take four (4) pills per day, you will be asked to drop this dose to three (3) pills per day for seven (7) days, then to two (2) pills per day for seven (7) days, then to one (1) per day for seven (7) days, and at the end of those last seven (7) days, this is when the "Drug Holiday" would start.   Will I have withdrawals? By doing a "slow downward taper" like this one, it  is unlikely that you will experience any significant withdrawal symptoms. Typically, what triggers withdrawals is the sudden stop of a high dose opioid therapy. Withdrawals can usually be avoided by slowly decreasing the dose over a prolonged period of time. If you do not follow these instructions and decide to stop your medication abruptly, withdrawals may be possible.  What are withdrawals? Withdrawals: refers to the wide range of symptoms that occur after stopping or dramatically reducing opiate drugs after heavy and prolonged use. Withdrawal symptoms do not occur to patients that use low dose opioids, or those who take the medication sporadically. Contrary to benzodiazepine (example: Valium, Xanax, etc.) or alcohol withdrawals ("Delirium Tremens"), opioid withdrawals are not lethal. Withdrawals are the physical manifestation of the body getting rid of the excess receptors.  Expected Symptoms Early symptoms of withdrawal may include: Agitation Anxiety Muscle aches Increased tearing Insomnia Runny nose Sweating Yawning  Late symptoms of withdrawal may include: Abdominal cramping Diarrhea Dilated pupils Goose bumps Nausea Vomiting  Will I experience withdrawals? Due to the slow nature of the taper, it is very unlikely that you will experience any.  What is a slow taper? Taper: refers to the gradual decrease in dose.  (Last update: 06/08/2020) ____________________________________________________________________________________________    

## 2021-07-25 LAB — TOXASSURE SELECT 13 (MW), URINE

## 2021-09-14 ENCOUNTER — Ambulatory Visit: Payer: Medicare PPO | Admitting: Internal Medicine

## 2021-09-14 ENCOUNTER — Telehealth: Payer: Self-pay

## 2021-09-14 ENCOUNTER — Other Ambulatory Visit
Admission: RE | Admit: 2021-09-14 | Discharge: 2021-09-14 | Disposition: A | Discharge status: Home / Self Care | Payer: Medicare PPO | Source: Home / Self Care | Attending: Internal Medicine | Admitting: Internal Medicine

## 2021-09-14 ENCOUNTER — Other Ambulatory Visit: Payer: Self-pay

## 2021-09-14 ENCOUNTER — Ambulatory Visit
Admission: RE | Admit: 2021-09-14 | Discharge: 2021-09-14 | Disposition: A | Discharge status: Home / Self Care | Payer: Medicare PPO | Source: Ambulatory Visit | Attending: Internal Medicine | Admitting: Internal Medicine

## 2021-09-14 ENCOUNTER — Ambulatory Visit
Admission: RE | Admit: 2021-09-14 | Discharge: 2021-09-14 | Disposition: A | Discharge status: Home / Self Care | Payer: Medicare PPO | Attending: Internal Medicine | Admitting: Internal Medicine

## 2021-09-14 ENCOUNTER — Encounter: Payer: Self-pay | Admitting: Internal Medicine

## 2021-09-14 DIAGNOSIS — R0609 Other forms of dyspnea: Secondary | ICD-10-CM

## 2021-09-14 DIAGNOSIS — R7989 Other specified abnormal findings of blood chemistry: Secondary | ICD-10-CM

## 2021-09-14 DIAGNOSIS — M05742 Rheumatoid arthritis with rheumatoid factor of left hand without organ or systems involvement: Secondary | ICD-10-CM

## 2021-09-14 DIAGNOSIS — M05741 Rheumatoid arthritis with rheumatoid factor of right hand without organ or systems involvement: Secondary | ICD-10-CM | POA: Diagnosis not present

## 2021-09-14 DIAGNOSIS — R0600 Dyspnea, unspecified: Secondary | ICD-10-CM

## 2021-09-14 DIAGNOSIS — R079 Chest pain, unspecified: Secondary | ICD-10-CM

## 2021-09-14 LAB — HEPATIC FUNCTION PANEL
ALT: 11 U/L (ref 0–44)
AST: 19 U/L (ref 15–41)
Albumin: 3.8 g/dL (ref 3.5–5.0)
Alkaline Phosphatase: 66 U/L (ref 38–126)
Bilirubin, Direct: <0.1 mg/dL (ref 0.0–0.2)
Total Bilirubin: 0.8 mg/dL (ref 0.3–1.2)
Total Protein: 7.7 g/dL (ref 6.5–8.1)

## 2021-09-14 LAB — BASIC METABOLIC PANEL
Anion gap: 9 (ref 5–15)
BUN: 14 mg/dL (ref 8–23)
CO2: 29 mmol/L (ref 22–32)
Calcium: 9.3 mg/dL (ref 8.9–10.3)
Chloride: 102 mmol/L (ref 98–111)
Creatinine, Ser: 1.02 mg/dL — ABNORMAL HIGH (ref 0.44–1.00)
GFR, Estimated: 57 mL/min — ABNORMAL LOW (ref >60)
Glucose, Bld: 102 mg/dL — ABNORMAL HIGH (ref 70–99)
Potassium: 4.3 mmol/L (ref 3.5–5.1)
Sodium: 140 mmol/L (ref 135–145)

## 2021-09-14 LAB — CBC WITH DIFFERENTIAL/PLATELET
Abs Immature Granulocytes: 0.02 10*3/uL (ref 0.00–0.07)
Basophils Absolute: 0 10*3/uL (ref 0.0–0.1)
Basophils Relative: 0 %
Eosinophils Absolute: 0.1 10*3/uL (ref 0.0–0.5)
Eosinophils Relative: 1 %
HCT: 44.6 % (ref 36.0–46.0)
Hemoglobin: 13.9 g/dL (ref 12.0–15.0)
Immature Granulocytes: 0 %
Lymphocytes Relative: 29 %
Lymphs Abs: 1.9 10*3/uL (ref 0.7–4.0)
MCH: 29.8 pg (ref 26.0–34.0)
MCHC: 31.2 g/dL (ref 30.0–36.0)
MCV: 95.7 fL (ref 80.0–100.0)
Monocytes Absolute: 0.4 10*3/uL (ref 0.1–1.0)
Monocytes Relative: 7 %
Neutro Abs: 4 10*3/uL (ref 1.7–7.7)
Neutrophils Relative %: 63 %
Platelets: 367 10*3/uL (ref 150–400)
RBC: 4.66 MIL/uL (ref 3.87–5.11)
RDW: 13.2 % (ref 11.5–15.5)
WBC: 6.4 10*3/uL (ref 4.0–10.5)
nRBC: 0 % (ref 0.0–0.2)

## 2021-09-14 LAB — D-DIMER, QUANTITATIVE: D-Dimer, Quant: 1.99 ug/mL-FEU — ABNORMAL HIGH (ref 0.00–0.50)

## 2021-09-14 LAB — BRAIN NATRIURETIC PEPTIDE: B Natriuretic Peptide: 16.1 pg/mL (ref 0.0–100.0)

## 2021-09-14 LAB — TSH: TSH: 1.27 u[IU]/mL (ref 0.350–4.500)

## 2021-09-14 MED ORDER — SYMBICORT 160-4.5 MCG/ACT IN AERO: INHALATION_SPRAY | RESPIRATORY_TRACT | 11 refills | Status: AC

## 2021-09-14 MED ORDER — PANTOPRAZOLE SODIUM 40 MG PO TBEC: 40.0000 mg | DELAYED_RELEASE_TABLET | Freq: Every day | ORAL | 2 refills | Status: AC

## 2021-09-14 MED ORDER — FAMOTIDINE 20 MG PO TABS: ORAL_TABLET | ORAL | 11 refills | Status: AC

## 2021-09-14 NOTE — Telephone Encounter (Signed)
Per Dr Sherene Sires pt needs a CTa due to elevated D-dimer, however pt has an allergy to contrast. Per Dr Sherene Sires he states he prefers VQ scan. Nothing further needed.

## 2021-09-14 NOTE — Assessment & Plan Note (Signed)
Body mass index is 48.81 kg/m.  -    Lab Results  Component Value Date   TSH 1.270 09/14/2021      Contributing to doe and risk of GERD >>>   reviewed the need and the process to achieve and maintain neg calorie balance > defer f/u primary care including intermittently monitoring thyroid status             Each maintenance medication was reviewed in detail including emphasizing most importantly the difference between maintenance and prns and under what circumstances the prns are to be triggered using an action plan format where appropriate.  Total time for H and P, chart review, counseling, reviewing when to use what device(s) and generating customized AVS unique to this office visit / same day charting > 60 min

## 2021-09-14 NOTE — Patient Instructions (Signed)
Pantoprazole (protonix) 40 mg   Take  30-60 min before first meal of the day and Pepcid (famotidine)  20 mg after supper until return to office - this is the best way to tell whether stomach acid is contributing to your problem.    GERD (REFLUX)  is an extremely common cause of respiratory symptoms just like yours , many times with no obvious heartburn at all.    It can be treated with medication, but also with lifestyle changes including elevation of the head of your bed (ideally with 6 -8inch blocks under the headboard of your bed),  Smoking cessation, avoidance of late meals, excessive alcohol, and avoid fatty foods, chocolate, peppermint, colas, red wine, and acidic juices such as orange juice.  NO MINT OR MENTHOL PRODUCTS SO NO COUGH DROPS  USE SUGARLESS CANDY INSTEAD (Jolley ranchers or Stover's or Life Savers) or even ice chips will also do - the key is to swallow to prevent all throat clearing. NO OIL BASED VITAMINS - use powdered substitutes.  Avoid fish oil when coughing.    Plan A = Automatic = Always=    Symbicort 160 Take 2 puffs first thing in am and then another 2 puffs about 12 hours later.    Work on inhaler technique:  relax and gently blow all the way out then take a nice smooth full deep breath back in, triggering the inhaler at same time you start breathing in.  Hold for up to 5 seconds if you can. Blow out thru nose. Rinse and gargle with water when done.  If mouth or throat bother you at all,  try brushing teeth/gums/tongue with arm and hammer toothpaste/ make a slurry and gargle and spit out.   Remember how golfers take practice swings first   Plan B = Backup (to supplement plan A, not to replace it) Only use your albuterol inhaler as a rescue medication to be used if you can't catch your breath by resting or doing a relaxed purse lip breathing pattern.  - The less you use it, the better it will work when you need it. - Ok to use the inhaler up to 2 puffs  every 4 hours if  you must but call for appointment if use goes up over your usual need - Don't leave home without it !!  (think of it like the spare tire for your car)   Plan C = Crisis (instead of Plan B but only if Plan B stops working) - only use your albuterol nebulizer if you first try Plan B and it fails to help > ok to use the nebulizer up to every 4 hours but if start needing it regularly call for immediate appointment

## 2021-09-14 NOTE — Progress Notes (Signed)
Mariah Poole, female    DOB: [birth year]   MRN: [id number]   Brief patient profile:  44 yobf  never smoker worked as Education officer, [employer]   referred to pulmonary clinic in [place]  09/14/2021 by Dr Mariah Poole   for ? poorly controlled asthma  dx years prior  to dx of RA.        History of Present Illness  09/14/2021  Pulmonary/ 1st office eval/ Mariah Poole / [place]  Office with asthma symptoms worse covid  06/2020  Chief Complaint  Patient presents with   Consult    Asthma- nasal congestion, sob, wheezing. Coughing,all symptoms worse since covid of august 2021  Dyspnea:  highly variable / better p saba and only using symbicort prn / does better shopping p saba  Cough: throat clearing assoc with nasal congestion but no purulent sputum Sleep: on side/ 4 pillows / "wheeze"  2-3 x per week at hs  SABA use: 2 - 6 puffs per day "depending on how active" she is Does not remember last prednisone for ra or breathing or whether it works   No obvious day to day or daytime variability or assoc  purulent sputum or mucus plugs or hemoptysis or cp or chest tightness, subjective wheeze or overt  hb symptoms on prn pantoprazole      Also denies any obvious fluctuation of symptoms with weather or environmental changes or other aggravating or alleviating factors except as outlined above   No unusual exposure hx or h/o childhood pna/ asthma or knowledge of premature birth.  Current Allergies, Complete Past Medical History, Past Surgical History, Family History, and Social History were reviewed in [facility] record.  ROS  The following are not active complaints unless bolded Hoarseness, sore throat, dysphagia, dental problems, itching, sneezing,  nasal congestion or discharge of excess mucus or purulent secretions, ear ache,   fever, chills, sweats, unintended wt loss or wt gain, classically pleuritic or exertional cp,  orthopnea pnd or arm/hand swelling  or leg swelling, presyncope,  palpitations, abdominal pain, anorexia, nausea, vomiting, diarrhea  or change in bowel habits or change in bladder habits, change in stools or change in urine, dysuria, hematuria,  rash, arthralgias, visual complaints, headache, numbness, weakness or ataxia or problems with walking or coordination,  change in mood or  memory.           Past Medical History:  Diagnosis Date   Anemia    Arthritis    COPD (chronic obstructive pulmonary disease) (HCC)    Depression    Diabetes mellitus without complication (HCC)    GERD (gastroesophageal reflux disease)    Hiatal hernia    History of contrast media allergy. (IVP dye) 09/01/2015   History of hiatal hernia    Hypercholesteremia    Hypertension    Morbid obesity (HCC)    Scoliosis    Shortness of breath dyspnea    with exertion    Outpatient Medications Prior to Visit  Medication Sig Dispense Refill   albuterol (PROVENTIL HFA;VENTOLIN HFA) 108 (90 BASE) MCG/ACT inhaler Inhale 2 puffs into the lungs every 4 (four) hours as needed for wheezing or shortness of breath.     atenolol (TENORMIN) 25 MG tablet Take 25 mg by mouth daily.      budesonide-formoterol (SYMBICORT) 160-4.5 MCG/ACT inhaler Inhale into the lungs.     Cholecalciferol (VITAMIN D3) 2000 UNITS TABS Take 1 tablet by mouth daily.     DULoxetine (CYMBALTA) 60 MG capsule Take 60  mg by mouth daily.     FEROSUL 325 (65 Fe) MG tablet Take 325 mg by mouth daily.     furosemide (LASIX) 40 MG tablet Take 40 mg by mouth 2 (two) times daily.     HYDROcodone-acetaminophen (NORCO) 10-325 MG tablet Take 1 tablet by mouth every 8 (eight) hours. Must last 30 days 90 tablet 0   [START ON 09/16/2021] HYDROcodone-acetaminophen (NORCO) 10-325 MG tablet Take 1 tablet by mouth every 8 (eight) hours. Must last 30 days 90 tablet 0   losartan (COZAAR) 25 MG tablet Take 25 mg by mouth daily.     pantoprazole (PROTONIX) 40 MG tablet Take 40 mg by mouth daily.     potassium chloride SA (K-DUR,KLOR-CON)  20 MEQ tablet Take 20 mEq by mouth daily.      pravastatin (PRAVACHOL) 20 MG tablet Take 20 mg by mouth at bedtime. Reported on 12/13/2015     SYMBICORT 160-4.5 MCG/ACT inhaler SMARTSIG:2 Puff(s) By Mouth Twice Daily     Turmeric Curcumin 500 MG CAPS Take by mouth daily.      vitamin C (ASCORBIC ACID) 500 MG tablet Take 500 mg by mouth daily.     pregabalin (LYRICA) 50 MG capsule Take 1 capsule (50 mg total) by mouth 2 (two) times daily. 60 capsule 2   spironolactone (ALDACTONE) 25 MG tablet Take 12.5 mg by mouth daily.     HYDROcodone-acetaminophen (NORCO) 10-325 MG tablet Take 1 tablet by mouth every 8 (eight) hours. Must last 30 days 90 tablet 0   magnesium oxide (MAG-OX) 400 (241.3 Mg) MG tablet Take 1 tablet (400 mg total) by mouth daily. 30 tablet 2   meloxicam (MOBIC) 15 MG tablet Take 1 tablet (15 mg total) by mouth daily. 30 tablet 2   No facility-administered medications prior to visit.     Objective:     BP 118/80 (BP Location: Left Arm, Patient Position: Sitting, Cuff Size: Normal)   Pulse 89   Temp 98.4 F (36.9 C) (Oral)   Ht 4\' 7"  (1.397 m)   Wt 210 lb (95.3 kg)   SpO2 95%   BMI 48.81 kg/m   SpO2: 95 %  Obese amb bf nad walks with cane    HEENT : pt wearing mask not removed for exam due to covid -19 concerns.    NECK :  without JVD/Nodes/TM/ nl carotid upstrokes bilaterally   LUNGS: no acc muscle use,  Nl contour chest which is clear to A and P bilaterally without cough on insp or exp maneuvers   CV:  RRR  no s3 or murmur or increase in P2, and 1-2+ ptting both LE's  ABD:  soft and nontender with nl inspiratory excursion in the supine position. No bruits or organomegaly appreciated, bowel sounds nl  MS:  awkward with genu valgus  with and RA deformities both hands, calf tenderness, cyanosis or clubbing No obvious joint restrictions   SKIN: warm and dry without lesions    NEURO:  alert, approp, nl sensorium with  no motor or cerebellar deficits apparent.     CXR PA and Lateral:   09/14/2021 :    I personally reviewed images / impression as follows:    Small volumes, marked elevation of R HD   Labs ordered/ reviewed:      Chemistry      Component Value Date/Time   NA 140 09/14/2021 1223   NA 141 06/17/2019 1617   NA 138 01/06/2013 1155   K 4.3 09/14/2021 1223  K 3.6 01/06/2013 1155   CL 102 09/14/2021 1223   CL 101 01/06/2013 1155   CO2 29 09/14/2021 1223   CO2 28 01/06/2013 1155   BUN 14 09/14/2021 1223   BUN 24 06/17/2019 1617   BUN 33 (H) 01/06/2013 1155   CREATININE 1.02 (H) 09/14/2021 1223   CREATININE 1.04 01/06/2013 1155      Component Value Date/Time   CALCIUM 9.3 09/14/2021 1223   CALCIUM 9.5 01/06/2013 1155   ALKPHOS 66 09/14/2021 1223   ALKPHOS 133 01/06/2013 1155   AST 19 09/14/2021 1223   AST 34 01/06/2013 1155   ALT 11 09/14/2021 1223   ALT 38 01/06/2013 1155   BILITOT 0.8 09/14/2021 1223   BILITOT 0.4 06/17/2019 1617   BILITOT 0.4 01/06/2013 1155        Lab Results  Component Value Date   WBC 6.4 09/14/2021   HGB 13.9 09/14/2021   HCT 44.6 09/14/2021   MCV 95.7 09/14/2021   PLT 367 09/14/2021     Lab Results  Component Value Date   DDIMER 1.99 (H) 09/14/2021      Lab Results  Component Value Date   TSH 1.270 09/14/2021      BNP  09/14/2021  = 16          Assessment   DOE (dyspnea on exertion) Onset was in 1990s at wt 170  - Echo 02/03/21  1. The left ventricle is relatively small in size with mildly increased wall  thickness.    2. The left ventricular systolic function is hyperdynamic, LVEF is visually  estimated at >70%.    3. There is grade I diastolic dysfunction (impaired relaxation).    4. The right ventricle is normal in size, with normal systolic function.    5. Estimated pulmonary artery systolic pressure is + RA pressure.    6. Technically difficult study due to chest wall/lung interference.    7. Poor endocardial definition. Patient refused echo contrast.    - 09/14/2021  After extensive coaching inhaler device,  effectiveness =   50% at best  - cxr 09/14/2021 new elevation R HD with pos d dimer > rec sniff and CTa  Symptoms are markedly disproportionate to objective findings and not clear to what extent this is actually a pulmonary  problem but pt does appear to have difficult to sort out respiratory symptoms of unknown origin for which  DDX  = almost all start with A and  include Adherence, Ace Inhibitors, Acid Reflux, Active Sinus Disease, Alpha 1 Antitripsin deficiency, Anxiety masquerading as Airways dz,  ABPA,  Allergy(esp in young), Aspiration (esp in elderly), Adverse effects of meds,  Active smoking or Vaping, A bunch of PE's/clot burden (a few small clots can't cause this syndrome unless there is already severe underlying pulm or vascular dz with poor reserve),  Anemia or thyroid disorder, plus two Bs  = Bronchiectasis and Beta blocker use..and one C= CHF     Adherence is always the initial "prime suspect" and is a multilayered concern that requires a "trust but verify" approach in every patient - starting with knowing how to use medications, especially inhalers, correctly, keeping up with refills and understanding the fundamental difference between maintenance and prns vs those medications only taken for a very short course and then stopped and not refilled.  - very shaky with med names  - see hfa teaching  - return with all meds in hand using a trust but verify approach to confirm  accurate Medication  Reconciliation The principal here is that until we are certain that the  patients are doing what we've asked, it makes no sense to ask them to do more.   ? Allergy / asthma > check IgE and Eos and  1st start with approp use of [id number]  And prn saba Re SABA :  I spent extra time with pt today reviewing appropriate use of albuterol for prn use on exertion with the following points: 1) saba is for relief of sob that does not improve by walking a  slower pace or resting but rather if the pt does not improve after trying this first. 2) If the pt is convinced, as many are, that saba helps recover from activity faster then it's easy to tell if this is the case by re-challenging : ie stop, take the inhaler, then p 5 minutes try the exact same activity (intensity of workload) that just caused the symptoms and see if they are substantially diminished or not after saba 3) if there is an activity that reproducibly causes the symptoms, try the saba 15 min before the activity on alternate days   If in fact the saba really does help, then fine to continue to use it prn but advised may need to look closer at the maintenance regimen being used to achieve better control of airways disease with exertion.   ? Acid (or non-acid) GERD > always difficult to exclude as up to 75% of pts in some series report no assoc GI/ Heartburn symptoms> rec max (24h)  acid suppression and diet restrictions/ reviewed and instructions given in writing.   ? Anemia/ thyroid dz ruled out today  ? Adverse side effects of meds: none of the usual suspects listed  ? A bunch of PEs >  D dimer elevated about x 2 upper limits   ? Anxiety/ depression/deconditioning  > usually at the bottom of this list of usual suspects but   note already on psychotropics and may interfere with adherence and also interpretation of response or lack thereof to symptom management which can be quite subjective.   ? CHF > see echo, also BNP quite low.  \  Rheumatoid arthritis involving both hands with positive rheumatoid factor (HCC) Also at risk of ILD from RA and resp bronchiolitis > check CTa   Discussed in detail all the  indications, usual  risks and alternatives  relative to the benefits with patient who agrees to proceed with w/u as outlined.       Morbid obesity due to excess calories (HCC) Body mass index is 48.81 kg/m.  -    Lab Results  Component Value Date   TSH 1.270 09/14/2021     Contributing to doe and risk of GERD >>>   reviewed the need and the process to achieve and maintain neg calorie balance > defer f/u primary care including intermittently monitoring thyroid status      Each maintenance medication was reviewed in detail including emphasizing most importantly the difference between maintenance and prns and under what circumstances the prns are to be triggered using an action plan format where appropriate.  Total time for H and P, chart review, counseling, reviewing when to use what device(s) and generating customized AVS unique to this office visit / same day charting > 60 min      Sandrea Hughs, MD 09/14/2021

## 2021-09-14 NOTE — Assessment & Plan Note (Addendum)
Onset was in 1990s at wt 170  - Echo 02/03/21 1. The left ventricle is relatively small in size with mildly increased wall  thickness.  2. The left ventricular systolic function is hyperdynamic, LVEF is visually  estimated at >70%.  3. There is grade I diastolic dysfunction (impaired relaxation).  4. The right ventricle is normal in size, with normal systolic function.  5. Estimated pulmonary artery systolic pressure is + RA pressure.  6. Technically difficult study due to chest wall/lung interference.  7. Poor endocardial definition. Patient refused echo contrast.   - 09/14/2021  After extensive coaching inhaler device,  effectiveness =   50% at best  - cxr 09/14/2021 new elevation R HD with pos d dimer > rec sniff and CTa  Symptoms are markedly disproportionate to objective findings and not clear to what extent this is actually a pulmonary  problem but pt does appear to have difficult to sort out respiratory symptoms of unknown origin for which  DDX  = almost all start with A and  include Adherence, Ace Inhibitors, Acid Reflux, Active Sinus Disease, Alpha 1 Antitripsin deficiency, Anxiety masquerading as Airways dz,  ABPA,  Allergy(esp in young), Aspiration (esp in elderly), Adverse effects of meds,  Active smoking or Vaping, A bunch of PE's/clot burden (a few small clots can't cause this syndrome unless there is already severe underlying pulm or vascular dz with poor reserve),  Anemia or thyroid disorder, plus two Bs  = Bronchiectasis and Beta blocker use..and one C= CHF     Adherence is always the initial "prime suspect" and is a multilayered concern that requires a "trust but verify" approach in every patient - starting with knowing how to use medications, especially inhalers, correctly, keeping up with refills and understanding the fundamental difference between maintenance and prns vs those medications only taken for a very short course and then stopped and not refilled.   - very shaky with med names  - see hfa teaching  - return with all meds in hand using a trust but verify approach to confirm accurate Medication  Reconciliation The principal here is that until we are certain that the  patients are doing what we've asked, it makes no sense to ask them to do more.   ? Allergy / asthma > check IgE and Eos and  1st start with approp use of symb160  And prn saba Re SABA :  I spent extra time with pt today reviewing appropriate use of albuterol for prn use on exertion with the following points: 1) saba is for relief of sob that does not improve by walking a slower pace or resting but rather if the pt does not improve after trying this first. 2) If the pt is convinced, as many are, that saba helps recover from activity faster then it's easy to tell if this is the case by re-challenging : ie stop, take the inhaler, then p 5 minutes try the exact same activity (intensity of workload) that just caused the symptoms and see if they are substantially diminished or not after saba 3) if there is an activity that reproducibly causes the symptoms, try the saba 15 min before the activity on alternate days   If in fact the saba really does help, then fine to continue to use it prn but advised may need to look closer at the maintenance regimen being used to achieve better control of airways disease with exertion.   ? Acid (or non-acid) GERD > always  difficult to exclude as up to 75% of pts in some series report no assoc GI/ Heartburn symptoms> rec max (24h)  acid suppression and diet restrictions/ reviewed and instructions given in writing.   ? Anemia/ thyroid dz ruled out today  ? Adverse side effects of meds: none of the usual suspects listed  ? A bunch of PEs >  D dimer elevated about x 2 upper limits   ? Anxiety/ depression/deconditioning  > usually at the bottom of this list of usual suspects but   note already on psychotropics and may interfere with adherence and also  interpretation of response or lack thereof to symptom management which can be quite subjective.   ? CHF > see echo, also BNP quite low.

## 2021-09-14 NOTE — Assessment & Plan Note (Signed)
Also at risk of ILD from RA and resp bronchiolitis > check CTa   Discussed in detail all the  indications, usual  risks and alternatives  relative to the benefits with patient who agrees to proceed with w/u as outlined.

## 2021-09-15 ENCOUNTER — Ambulatory Visit
Admission: RE | Admit: 2021-09-15 | Discharge: 2021-09-15 | Disposition: A | Discharge status: Home / Self Care | Payer: Medicare PPO | Source: Ambulatory Visit | Attending: Internal Medicine | Admitting: Internal Medicine

## 2021-09-15 ENCOUNTER — Encounter
Admission: RE | Admit: 2021-09-15 | Discharge: 2021-09-15 | Disposition: A | Discharge status: Home / Self Care | Payer: Medicare PPO | Source: Ambulatory Visit | Attending: Internal Medicine | Admitting: Internal Medicine

## 2021-09-15 DIAGNOSIS — R0600 Dyspnea, unspecified: Secondary | ICD-10-CM

## 2021-09-15 DIAGNOSIS — R079 Chest pain, unspecified: Secondary | ICD-10-CM | POA: Diagnosis present

## 2021-09-15 DIAGNOSIS — R7989 Other specified abnormal findings of blood chemistry: Secondary | ICD-10-CM | POA: Diagnosis present

## 2021-09-15 MED ORDER — TECHNETIUM TO 99M ALBUMIN AGGREGATED
4.0000 | Freq: Once | INTRAVENOUS | Status: AC | PRN
  Administered 2021-09-15: 4 via INTRAVENOUS

## 2021-09-18 ENCOUNTER — Telehealth: Payer: Self-pay | Admitting: Internal Medicine

## 2021-09-18 NOTE — Telephone Encounter (Signed)
Spoke to patient.  Patient is questioning if CXR is needed on 09/22/2021. According to our records, sniff test is scheduled for 09/22/2021. Patient had CXR on on 09/15/2021. Patient is aware and voiced her understanding. She is agreeable to sniff test. Nothing further needed at this time.

## 2021-09-20 NOTE — Telephone Encounter (Signed)
Created in error

## 2021-09-21 LAB — IGE: IgE (Immunoglobulin E), Serum: 44 IU/mL (ref 6–495)

## 2021-09-22 ENCOUNTER — Ambulatory Visit
Admission: RE | Admit: 2021-09-22 | Discharge: 2021-09-22 | Disposition: A | Discharge status: Home / Self Care | Payer: Medicare PPO | Source: Ambulatory Visit | Attending: Internal Medicine | Admitting: Internal Medicine

## 2021-09-22 ENCOUNTER — Other Ambulatory Visit: Payer: Self-pay

## 2021-09-22 DIAGNOSIS — R0609 Other forms of dyspnea: Secondary | ICD-10-CM | POA: Diagnosis present

## 2021-10-11 ENCOUNTER — Ambulatory Visit: Payer: Medicare PPO | Attending: Pain Medicine | Admitting: Pain Medicine

## 2021-10-11 ENCOUNTER — Other Ambulatory Visit: Payer: Self-pay

## 2021-10-11 ENCOUNTER — Encounter: Payer: Self-pay | Admitting: Pain Medicine

## 2021-10-11 VITALS — BP 111/80 | HR 121 | Temp 96.8°F | Resp 16 | Ht 60.0 in | Wt 200.0 lb

## 2021-10-11 DIAGNOSIS — M7918 Myalgia, other site: Secondary | ICD-10-CM | POA: Insufficient documentation

## 2021-10-11 DIAGNOSIS — M545 Low back pain, unspecified: Secondary | ICD-10-CM | POA: Insufficient documentation

## 2021-10-11 DIAGNOSIS — M542 Cervicalgia: Secondary | ICD-10-CM

## 2021-10-11 DIAGNOSIS — G894 Chronic pain syndrome: Secondary | ICD-10-CM | POA: Insufficient documentation

## 2021-10-11 DIAGNOSIS — M25551 Pain in right hip: Secondary | ICD-10-CM | POA: Diagnosis present

## 2021-10-11 DIAGNOSIS — Z79891 Long term (current) use of opiate analgesic: Secondary | ICD-10-CM | POA: Diagnosis present

## 2021-10-11 DIAGNOSIS — M47816 Spondylosis without myelopathy or radiculopathy, lumbar region: Secondary | ICD-10-CM | POA: Diagnosis present

## 2021-10-11 DIAGNOSIS — M5136 Other intervertebral disc degeneration, lumbar region: Secondary | ICD-10-CM | POA: Insufficient documentation

## 2021-10-11 DIAGNOSIS — G8929 Other chronic pain: Secondary | ICD-10-CM | POA: Insufficient documentation

## 2021-10-11 DIAGNOSIS — M0579 Rheumatoid arthritis with rheumatoid factor of multiple sites without organ or systems involvement: Secondary | ICD-10-CM | POA: Insufficient documentation

## 2021-10-11 DIAGNOSIS — Z79899 Other long term (current) drug therapy: Secondary | ICD-10-CM | POA: Insufficient documentation

## 2021-10-11 DIAGNOSIS — M47812 Spondylosis without myelopathy or radiculopathy, cervical region: Secondary | ICD-10-CM | POA: Diagnosis present

## 2021-10-11 DIAGNOSIS — M48062 Spinal stenosis, lumbar region with neurogenic claudication: Secondary | ICD-10-CM | POA: Insufficient documentation

## 2021-10-11 MED ORDER — HYDROCODONE-ACETAMINOPHEN 10-325 MG PO TABS: 1.0000 | ORAL_TABLET | Freq: Three times a day (TID) | ORAL | 0 refills | Status: DC

## 2021-10-11 NOTE — Patient Instructions (Signed)

## 2021-10-11 NOTE — Progress Notes (Signed)
Nursing Pain Medication Assessment:  Safety precautions to be maintained throughout the outpatient stay will include: orient to surroundings, keep bed in low position, maintain call bell within reach at all times, provide assistance with transfer out of bed and ambulation.  Medication Inspection Compliance: Pill count conducted under aseptic conditions, in front of the patient. Neither the pills nor the bottle was removed from the patient's sight at any time. Once count was completed pills were immediately returned to the patient in their original bottle.  Medication: Hydrocodone/APAP Pill/Patch Count:  13 of 90 pills remain Pill/Patch Appearance: Markings consistent with prescribed medication Bottle Appearance: Standard pharmacy container. Clearly labeled. Filled Date: 24 / 29 / 2022 Last Medication intake:  Today

## 2021-10-11 NOTE — Progress Notes (Signed)
PROVIDER NOTE: Information contained herein reflects review and annotations entered in association with encounter. Interpretation of such information and data should be left to medically-trained personnel. Information provided to patient can be located elsewhere in the medical record under "Patient Instructions". Document created using STT-dictation technology, any transcriptional errors that may result from process are unintentional.    Patient: Mariah Poole  Service Category: E/M  Provider: Gaspar Cola, MD  DOB: 1946-08-24  DOS: 10/11/2021  Specialty: Interventional Pain Management  MRN: [id number]  Setting: Ambulatory outpatient  PCP: Marguerita Merles, MD  Type: Established Patient    Referring Provider: Marguerita Merles, MD  Location: Office  Delivery: Face-to-face     HPI  Mariah Poole, a 75 y.o. year old female, is here today because of her Chronic pain syndrome [G89.4]. Mariah Poole primary complain today is Hip Pain (Right ) and Knee Pain (Right ) Last encounter: My last encounter with her was on 07/18/2021. 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, L4-5, L5-S1) w/ neurogenic claudication; Lumbar foraminal stenosis (Multilevel) (Bilateral); Cervicalgia; DDD (degenerative disc disease), cervical; Cervical facet hypertrophy; Cervical facet joint syndrome; Lumbar facet hypertrophy; 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 Pain Assessment: Severity of Chronic pain is reported as a 6 /10. Location: Hip (knee) Right/hip pain down the right leg. Onset: More than a month ago. Quality: Stabbing, Pins and needles, Discomfort, Constant, Throbbing. Timing: Constant. Modifying factor(s): nothing currently. Vitals:  height is 5' (1.524 m) and weight is 200 lb (90.7 kg). Her temporal temperature is 96.8 F (36 C) (abnormal). Her blood pressure is 111/80 and her pulse is 121 (abnormal). Her respiration is 16 and oxygen saturation is 94%.   Reason for encounter: medication management.   The patient indicates doing well with the current medication regimen. No adverse reactions or side effects reported to the medications.   RTCB: 01/14/2022 Nonopioids transferred 11/09/2020: Magnesium, Lyrica, and Mobic.  Pharmacotherapy Assessment  Analgesic: Hydrocodone/APAP 10/325, 1 tab PO q 8 hrs PRN (30 mg/day of hydrocodone) (975 mg/day of acetaminophen) MME/day: 30 mg/day.   Monitoring: [place] PMP: PDMP reviewed during this encounter.       Pharmacotherapy: No side-effects or adverse reactions reported. Compliance: No problems identified. Effectiveness: Clinically acceptable.  [name], RN  10/11/2021  8:47 AM  Sign when Signing Visit Nursing Pain Medication Assessment:  Safety precautions to be maintained throughout the outpatient stay will include: orient to surroundings, keep bed in low position, maintain call bell within reach at all times, provide assistance with transfer out of bed and ambulation.  Medication Inspection Compliance: Pill count conducted under aseptic conditions, in front of the patient. Neither the  pills nor the bottle was removed from the patient's sight at any time. Once count was completed pills were immediately returned to the patient in  their original bottle.  Medication: Hydrocodone/APAP Pill/Patch Count:  13 of 90 pills remain Pill/Patch Appearance: Markings consistent with prescribed medication Bottle Appearance: Standard pharmacy container. Clearly labeled. Filled Date: 51 / 29 / 2022 Last Medication intake:  Today    UDS:  Summary  Date Value Ref Range Status  07/19/2021 Note  Final    Comment:    ==================================================================== [employer] 13 (MW) ==================================================================== Test                             Result       Flag       Units  Drug Present and Declared for Prescription Verification   Hydrocodone                    696          EXPECTED   ng/mg creat   Hydromorphone                  42           EXPECTED   ng/mg creat   Dihydrocodeine                 332          EXPECTED   ng/mg creat   Norhydrocodone                 1671         EXPECTED   ng/mg creat    Sources of hydrocodone include scheduled prescription medications.    Hydromorphone, dihydrocodeine and norhydrocodone are expected    metabolites of hydrocodone. Hydromorphone and dihydrocodeine are    also available as scheduled prescription medications.  ==================================================================== Test                      Result    Flag   Units      Ref Range   Creatinine              195              mg/dL      >=20 ==================================================================== Declared Medications:  The flagging and interpretation on this report are based on the  following declared medications.  Unexpected results may arise from  inaccuracies in the declared medications.   **Note: The testing scope of this panel includes these medications:   Hydrocodone (Norco)   **Note: The  testing scope of this panel does not include the  following reported medications:   Acetaminophen (Norco)  Albuterol (Ventolin HFA)  Atenolol (Tenormin)  Budesonide (Symbicort)  Duloxetine (Cymbalta)  Formoterol (Symbicort)  Furosemide (Lasix)  Iron  Losartan (Cozaar)  Magnesium (Mag-Ox)  Meloxicam (Mobic)  Pantoprazole (Protonix)  Potassium (Klor-Con)  Pravastatin (Pravachol)  Pregabalin (Lyrica)  Spironolactone (Aldactone)  Turmeric  Vitamin C  Vitamin D3 ==================================================================== For clinical consultation, please call [phone]. ====================================================================      ROS  Constitutional: Denies any fever or chills Gastrointestinal: No reported hemesis, hematochezia, vomiting, or acute GI distress Musculoskeletal: Denies any acute onset joint swelling, redness, loss of ROM, or weakness Neurological: No reported episodes of acute onset apraxia, aphasia, dysarthria, agnosia, amnesia, paralysis, loss of coordination, or loss of consciousness  Medication Review  DULoxetine, HYDROcodone-acetaminophen, Turmeric Curcumin, Vitamin D3, albuterol, atenolol, budesonide-formoterol, famotidine, ferrous sulfate, furosemide, losartan, pantoprazole, potassium chloride SA, pravastatin, spironolactone, and vitamin C  History Review  Allergy: Mariah Poole is allergic to  contrast media [iodinated diagnostic agents], ioxaglate, and shellfish allergy. Drug: Mariah Poole  reports current drug use. Alcohol:  reports no history of alcohol use. Tobacco:  reports that she has never smoked. She has never used smokeless tobacco. Social: Mariah Poole  reports that she has never smoked. She has never used smokeless tobacco. She reports current drug use. She reports that she does not drink alcohol. Medical:  has a past medical history of Anemia, Arthritis, COPD (chronic obstructive pulmonary disease) ([place]), Depression,  Diabetes mellitus without complication ([place]), GERD (gastroesophageal reflux disease), Hiatal hernia, History of contrast media allergy. (IVP dye) (09/01/2015), History of hiatal hernia, Hypercholesteremia, Hypertension, Morbid obesity ([place]), Scoliosis, and Shortness of breath dyspnea. Surgical: Mariah Poole  has a past surgical history that includes Abdominal hysterectomy; Cataract extraction w/ intraocular lens implant (Right); Gastric bypass; Hernia repair; Cataract extraction w/PHACO (Left, 07/19/2015); Cholecystectomy; and Hernia repair. Family: family history includes Diabetes in her mother; Heart disease in her father.  Laboratory Chemistry Profile   Renal Lab Results  Component Value Date   BUN 14 09/14/2021   CREATININE 1.02 (H) 09/14/2021   BCR 21 06/17/2019   GFRAA 53 (L) 06/17/2019   GFRNONAA 57 (L) 09/14/2021    Hepatic Lab Results  Component Value Date   AST 19 09/14/2021   ALT 11 09/14/2021   ALBUMIN 3.8 09/14/2021   ALKPHOS 66 09/14/2021   LIPASE 19 10/27/2015    Electrolytes Lab Results  Component Value Date   NA 140 09/14/2021   K 4.3 09/14/2021   CL 102 09/14/2021   CALCIUM 9.3 09/14/2021   MG 1.9 06/17/2019    Bone Lab Results  Component Value Date   25OHVITD1 45 06/17/2019   25OHVITD2 <1.0 06/17/2019   25OHVITD3 45 06/17/2019    Inflammation (CRP: Acute Phase) (ESR: Chronic Phase) Lab Results  Component Value Date   CRP 7 06/17/2019   ESRSEDRATE 49 (H) 06/17/2019   LATICACIDVEN 4.0 (H) 11/23/2009         Note: Above Lab results reviewed.  Recent Imaging Review  DG Sniff Test CLINICAL DATA:  Worsening breathing since COVID-19 infection in August.  EXAM: CHEST FLUOROSCOPY  TECHNIQUE: Real-time fluoroscopic evaluation of the chest was performed.  FLUOROSCOPY TIME:  Fluoroscopy Time:  0.6 minute  Radiation Exposure Index (if provided by the fluoroscopic device): 6.4 mGy  Number of Acquired Spot Images: 0  COMPARISON:  CT chest  11/23/2009  FINDINGS: Real-time fluoroscopy of the diaphragms was performed during inspiration, expiration and sniffing.  Elevation of the right diaphragm unchanged compared 11/23/2009.  The diaphragm moves appropriately during inspiration, expiration and sniffing, but less than expected.  Right basilar atelectasis.  IMPRESSION: Normal motion of the diaphragms, but the overall excursion is relatively decreased.  Electronically Signed   By: Kathreen Devoid M.D.   On: 09/22/2021 09:44 Note: Reviewed        Physical Exam  General appearance: Well nourished, well developed, and well hydrated. In no apparent acute distress Mental status: Alert, oriented x 3 (person, place, & time)       Respiratory: No evidence of acute respiratory distress Eyes: PERLA Vitals: BP 111/80 (BP Location: Right Arm, Patient Position: Sitting, Cuff Size: Normal)   Pulse (!) 121   Temp (!) 96.8 F (36 C) (Temporal)   Resp 16   Ht 5' (1.524 m)   Wt 200 lb (90.7 kg)   SpO2 94%   BMI 39.06 kg/m  BMI: Estimated body mass index is 39.06 kg/m as calculated  from the following:   Height as of this encounter: 5' (1.524 m).   Weight as of this encounter: 200 lb (90.7 kg). Ideal: Ideal body weight: 45.5 kg (100 lb 4.9 oz) Adjusted ideal body weight: 63.6 kg (140 lb 3 oz)  Assessment   Status Diagnosis  Controlled Controlled Controlled 1. Chronic pain syndrome   2. Rheumatoid arthritis involving multiple sites with positive rheumatoid factor (HCC)   3. Chronic low back pain (1ry area of Pain) (Bilateral) (R>L) w/o sciatica   4. DDD (degenerative disc disease), lumbar   5. Lumbar facet syndrome (Bilateral) (R>L)   6. 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 Chronic musculoskeletal pain   8. Chronic hip pain (Right)   9. Cervicalgia   10. Cervical facet joint syndrome   11. Pharmacologic therapy   12. Chronic use of opiate for therapeutic purpose   13.  Encounter for chronic pain management   14. 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  Mariah Poole has a current medication list which includes the following long-term medication(s): albuterol, atenolol, budesonide-formoterol, duloxetine, famotidine, ferosul, furosemide, [START ON 10/16/2021] hydrocodone-acetaminophen, [START ON 11/15/2021] hydrocodone-acetaminophen, [START ON 12/15/2021] hydrocodone-acetaminophen, pantoprazole, potassium chloride sa, pravastatin, symbicort, and spironolactone.  Pharmacotherapy (Medications Ordered): Meds ordered this encounter  Medications   HYDROcodone-acetaminophen (NORCO) 10-325 MG tablet    Sig: Take 1 tablet by mouth every 8 (eight) hours. Must last 30 days    Dispense:  90 tablet    Refill:  0    DO NOT: delete (not duplicate); no partial-fill (will deny script to complete), no refill request (F/U required). DISPENSE: 1 day early if closed on fill date. WARN: No CNS-depressants within 8 hrs of med.   HYDROcodone-acetaminophen (NORCO) 10-325 MG tablet    Sig: Take 1 tablet by mouth every 8 (eight) hours. Must last 30 days    Dispense:  90 tablet    Refill:  0    DO NOT: delete (not duplicate); no partial-fill (will deny script to complete), no refill request (F/U required). DISPENSE: 1 day early if closed on fill date. WARN: No CNS-depressants within 8 hrs of med.   HYDROcodone-acetaminophen (NORCO) 10-325 MG tablet    Sig: Take 1 tablet by mouth every 8 (eight) hours. Must last 30 days    Dispense:  90 tablet    Refill:  0    DO NOT: delete (not duplicate); no partial-fill (will deny script to  complete), no refill request (F/U required). DISPENSE: 1 day early if closed on fill date. WARN: No CNS-depressants within 8 hrs of med.    Orders:  No orders of the defined types were placed in this encounter.  Follow-up plan:   Return in about 3 months (around 01/14/2022) for Eval-day (M,W), (F2F), (MM).     Considering:   Possible bilateral lumbar facet RFA    Palliative PRN treatment(s):   Palliative bilateral lumbar facet blocks (#3)  Palliative intra-articular hip joint injection  Palliative LESIs     Recent Visits Date Type Provider Dept  07/18/21 Office Visit Milinda Pointer, MD [facility]  Showing recent visits within past 90 days  and meeting all other requirements Today's Visits Date Type Provider Dept  10/11/21 Office Visit Milinda Pointer, MD [facility]  Showing today's visits and meeting all other requirements Future Appointments No visits were found meeting these conditions. Showing future appointments within next 90 days and meeting all other requirements I discussed the assessment and treatment plan with the patient. The patient was provided an opportunity to ask questions and all were answered. The patient agreed with the plan and demonstrated an understanding of the instructions.  Patient advised to call back or seek an in-person evaluation if the symptoms or condition worsens.  Duration of encounter: 30 minutes.  Note by: Gaspar Cola, MD Date: 10/11/2021; Time: 9:16 AM

## 2021-10-31 ENCOUNTER — Other Ambulatory Visit: Payer: Self-pay

## 2021-10-31 ENCOUNTER — Encounter: Payer: Self-pay | Admitting: Adult Health

## 2021-10-31 ENCOUNTER — Ambulatory Visit: Payer: Medicare PPO | Admitting: Adult Health

## 2021-10-31 VITALS — BP 150/80 | HR 71 | Temp 98.3°F | Ht <= 58 in | Wt 204.8 lb

## 2021-10-31 DIAGNOSIS — R0602 Shortness of breath: Secondary | ICD-10-CM | POA: Diagnosis not present

## 2021-10-31 DIAGNOSIS — J45909 Unspecified asthma, uncomplicated: Secondary | ICD-10-CM | POA: Insufficient documentation

## 2021-10-31 DIAGNOSIS — U099 Post covid-19 condition, unspecified: Secondary | ICD-10-CM

## 2021-10-31 DIAGNOSIS — R0609 Other forms of dyspnea: Secondary | ICD-10-CM

## 2021-10-31 DIAGNOSIS — J4530 Mild persistent asthma, uncomplicated: Secondary | ICD-10-CM

## 2021-10-31 NOTE — Progress Notes (Signed)
@Patient  ID: Mariah Poole, female    DOB: August 11, 1946, 75 y.o.   MRN: [id number]  Chief Complaint  Patient presents with   Follow-up    Referring provider: Marguerita Merles, MD  HPI: 75 year old female never smoker seen for pulmonary consult September 14, 2021 for Asthma and Dyspnea  Medical history significant for [facility]   TEST/EVENTS :  08/2021 Eosinophils 100, IgE 44 VQ scan September 15, 2021 negative for PE. Chest x-ray September 14, 2021 showed moderate elevation the right hemidiaphragm new since 2016 and a moderate size hiatal hernia Sniff test on September 22, 2021 showed normal motion of the diaphragms, overall excursion is relatively decreased Myoview 11/2019 low risk study .    10/31/2021 Follow up ; Asthma , Dyspnea  Patient returns for a 6-week follow-up.  Patient was seen last visit for pulmonary consult for asthma and shortness of breath.  Patient was recommended to use Symbicort twice daily on regular basis . Marland Kitchen  Lab work showed eosinophil absolute count was at 100, IgE 44.  She was set up for a VQ scan that was negative for PE.  Chest x-ray showed a moderate elevation of the right hemidiaphragm.  Patient was set up for a sniff test that was done on September 22, 2021 that showed normal motion of the diaphragms with overall excursion relatively decreased. Says overall breathing is slightly better, decreased albuterol use.   Says she stopped being as active since the pandemic and she suffered covid infection . More dyspnea after Covid with less activity and increased fatigue /low energy . Now gets winded with minimal activity . Has to rest often .   Lives at home. Daughter lives with her. Drives . Does light housework. And shopping    Allergies  Allergen Reactions   Contrast Media [Iodinated Diagnostic Agents] Other (See Comments)    Cardiovascular collapse. Loss of consciousness. Generalized swelling. Betadine OK   Ioxaglate Other (See Comments)    "I pass out" Betadine OK    Shellfish Allergy     Immunization History  Administered Date(s) Administered   Influenza,inj,Quad PF,6+ Mos 12/10/2015, 09/30/2016   Influenza-Unspecified 09/16/2019, 11/16/2020   Pneumococcal Conjugate-13 11/02/2014   Pneumococcal Polysaccharide-23 07/06/2010   Tdap 04/15/2014   Zoster Recombinat (Shingrix) 05/11/2020   Zoster, Live 12/31/2011    Past Medical History:  Diagnosis Date   Anemia    Arthritis    COPD (chronic obstructive pulmonary disease) ([place])    Depression    Diabetes mellitus without complication (HCC)    GERD (gastroesophageal reflux disease)    Hiatal hernia    History of contrast media allergy. (IVP dye) 09/01/2015   History of hiatal hernia    Hypercholesteremia    Hypertension    Morbid obesity ([place])    Scoliosis    Shortness of breath dyspnea    with exertion    Tobacco History: Social History   Tobacco Use  Smoking Status Never  Smokeless Tobacco Never  Tobacco Comments   does not smoke    Counseling given: Not Answered Tobacco comments: does not smoke    Outpatient Medications Prior to Visit  Medication Sig Dispense Refill   albuterol (PROVENTIL HFA;VENTOLIN HFA) 108 (90 BASE) MCG/ACT inhaler Inhale 2 puffs into the lungs every 4 (four) hours as needed for wheezing or shortness of breath.     atenolol (TENORMIN) 25 MG tablet Take 25 mg by mouth daily.      budesonide-formoterol (SYMBICORT) 160-4.5 MCG/ACT inhaler Inhale into the lungs.  Cholecalciferol (VITAMIN D3) 2000 UNITS TABS Take 1 tablet by mouth daily.     DULoxetine (CYMBALTA) 60 MG capsule Take 60 mg by mouth daily.     famotidine (PEPCID) 20 MG tablet One after supper 30 tablet 11   FEROSUL 325 (65 Fe) MG tablet Take 325 mg by mouth daily.     furosemide (LASIX) 40 MG tablet Take 40 mg by mouth 2 (two) times daily.     HYDROcodone-acetaminophen (NORCO) 10-325 MG tablet Take 1 tablet by mouth every 8 (eight) hours. Must last 30 days 90 tablet 0   [START ON 11/15/2021]  HYDROcodone-acetaminophen (NORCO) 10-325 MG tablet Take 1 tablet by mouth every 8 (eight) hours. Must last 30 days 90 tablet 0   [START ON 12/15/2021] HYDROcodone-acetaminophen (NORCO) 10-325 MG tablet Take 1 tablet by mouth every 8 (eight) hours. Must last 30 days 90 tablet 0   losartan (COZAAR) 25 MG tablet Take 25 mg by mouth daily.     pantoprazole (PROTONIX) 40 MG tablet Take 1 tablet (40 mg total) by mouth daily. 30 tablet 2   potassium chloride SA (K-DUR,KLOR-CON) 20 MEQ tablet Take 20 mEq by mouth daily.      pravastatin (PRAVACHOL) 20 MG tablet Take 20 mg by mouth at bedtime. Reported on 12/13/2015     SYMBICORT 160-4.5 MCG/ACT inhaler Take 2 puffs first thing in am and then another 2 puffs about 12 hours later. 1 each 11   Turmeric Curcumin 500 MG CAPS Take by mouth daily.      vitamin C (ASCORBIC ACID) 500 MG tablet Take 500 mg by mouth daily.     spironolactone (ALDACTONE) 25 MG tablet Take 12.5 mg by mouth daily.     No facility-administered medications prior to visit.     Review of Systems:   Constitutional:   No  weight loss, night sweats,  Fevers, chills, fatigue, or  lassitude.  HEENT:   No headaches,  Difficulty swallowing,  Tooth/dental problems, or  Sore throat,                No sneezing, itching, ear ache, nasal congestion, post nasal drip,   CV:  No chest pain,  Orthopnea, PND, swelling in lower extremities, anasarca, dizziness, palpitations, syncope.   GI  No heartburn, indigestion, abdominal pain, nausea, vomiting, diarrhea, change in bowel habits, loss of appetite, bloody stools.   Resp: No shortness of breath with exertion or at rest.  No excess mucus, no productive cough,  No non-productive cough,  No coughing up of blood.  No change in color of mucus.  No wheezing.  No chest wall deformity  Skin: no rash or lesions.  GU: no dysuria, change in color of urine, no urgency or frequency.  No flank pain, no hematuria   MS:  No joint pain or swelling.  No decreased  range of motion.  No back pain.    Physical Exam  BP (!) 150/80 (BP Location: Left Arm, Patient Position: Sitting, Cuff Size: Normal)    Pulse 71    Temp 98.3 F (36.8 C) (Oral)    Ht 4' 7.5" (1.41 m)    Wt 204 lb 12.8 oz (92.9 kg)    SpO2 95%    BMI 46.75 kg/m   GEN: A/Ox3; pleasant , NAD, well nourished    HEENT:  [place]/AT,  EACs-clear, TMs-wnl, NOSE-clear, THROAT-clear, no lesions, no postnasal drip or exudate noted.   NECK:  Supple w/ fair ROM; no JVD; normal carotid impulses w/o  bruits; no thyromegaly or nodules palpated; no lymphadenopathy.    RESP  Clear  P & A; w/o, wheezes/ rales/ or rhonchi. no accessory muscle use, no dullness to percussion  CARD:  RRR, no m/r/g, no peripheral edema, pulses intact, no cyanosis or clubbing.  GI:   Soft & nt; nml bowel sounds; no organomegaly or masses detected.   Musco: Warm bil, no deformities or joint swelling noted.   Neuro: alert, no focal deficits noted.    Skin: Warm, no lesions or rashes    Lab Results:  BMET   BNP   ProBNP No results found for: PROBNP  Imaging: No results found.    No flowsheet data found.  No results found for: NITRICOXIDE      Assessment & Plan:   Asthma Mild persistent asthma - cont on symbicort  Refer to pulmonary rehab .  Check PFT on return   Plan  Patient Instructions  Continue on Symbicort 2 puffs Twice daily, rinse after use.  Albuterol inhaler 1-2 puffs every 4-6hrs as needed   Activity as tolerated.  Continue on Lasix daily  Low salt diet.  Refer to Pulmonary rehab  Follow up with Dr. Melvyn Novas  or Tommaso Cavitt NP in 3 months with PFT        DOE (dyspnea on exertion) Suspect is multifactoral in nature - VQ scan is neg, SNIFF test neg.  Physical deconditioning, refer to Pulmonary rehab  . DCHF =appears euvolemic on exam .  Check PFT on return   Plan  Patient Instructions  Continue on Symbicort 2 puffs Twice daily, rinse after use.  Albuterol inhaler 1-2 puffs every  4-6hrs as needed   Activity as tolerated.  Continue on Lasix daily  Low salt diet.  Refer to Pulmonary rehab  Follow up with Dr. Melvyn Novas  or Kerwin Augustus NP in 3 months with PFT          Rexene Edison, NP 10/31/2021

## 2021-10-31 NOTE — Assessment & Plan Note (Addendum)
Suspect is multifactoral in nature - VQ scan is neg, SNIFF test neg.  Physical deconditioning, refer to Pulmonary rehab  . DCHF =appears euvolemic on exam .  Check PFT on return   Plan  Patient Instructions  Continue on Symbicort 2 puffs Twice daily, rinse after use.  Albuterol inhaler 1-2 puffs every 4-6hrs as needed   Activity as tolerated.  Continue on Lasix daily  Low salt diet.  Refer to Pulmonary rehab  Follow up with Dr. Sherene Sires  or Mahin Guardia NP in 3 months with PFT

## 2021-10-31 NOTE — Patient Instructions (Addendum)
Continue on Symbicort 2 puffs Twice daily, rinse after use.  Albuterol inhaler 1-2 puffs every 4-6hrs as needed   Activity as tolerated.  Continue on Lasix daily  Low salt diet.  Refer to Pulmonary rehab  Follow up with Dr. Sherene Sires  or Luisangel Wainright NP in 3 months with PFT

## 2021-10-31 NOTE — Assessment & Plan Note (Signed)
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  Check PFT on return   Plan  Patient Instructions  Continue on Symbicort 2 puffs Twice daily, rinse after use.  Albuterol inhaler 1-2 puffs every 4-6hrs as needed   Activity as tolerated.  Continue on Lasix daily  Low salt diet.  Refer to Pulmonary rehab  Follow up with Dr. Sherene Sires  or Kobi Aller NP in 3 months with PFT

## 2021-11-23 ENCOUNTER — Other Ambulatory Visit: Payer: Self-pay

## 2021-11-23 ENCOUNTER — Encounter: Payer: Medicare PPO | Attending: Internal Medicine

## 2021-11-23 DIAGNOSIS — R0600 Dyspnea, unspecified: Secondary | ICD-10-CM | POA: Insufficient documentation

## 2021-11-23 DIAGNOSIS — J45909 Unspecified asthma, uncomplicated: Secondary | ICD-10-CM | POA: Insufficient documentation

## 2021-11-23 DIAGNOSIS — U099 Post covid-19 condition, unspecified: Secondary | ICD-10-CM | POA: Insufficient documentation

## 2021-11-23 DIAGNOSIS — R0609 Other forms of dyspnea: Secondary | ICD-10-CM

## 2021-11-23 NOTE — Progress Notes (Signed)
Virtual Visit completed. Patient informed on EP and RD appointment and 6 Minute walk test. Patient also informed of patient health questionnaires on My Chart. Patient Verbalizes understanding. Visit diagnosis can be found in [facility] 10/31/2021.

## 2021-12-18 ENCOUNTER — Other Ambulatory Visit: Payer: Self-pay

## 2021-12-18 ENCOUNTER — Encounter: Payer: Medicare PPO | Admitting: *Deleted

## 2021-12-18 VITALS — Ht <= 58 in | Wt 200.7 lb

## 2021-12-18 DIAGNOSIS — R0600 Dyspnea, unspecified: Secondary | ICD-10-CM | POA: Diagnosis not present

## 2021-12-18 DIAGNOSIS — J45909 Unspecified asthma, uncomplicated: Secondary | ICD-10-CM | POA: Diagnosis not present

## 2021-12-18 DIAGNOSIS — U099 Post covid-19 condition, unspecified: Secondary | ICD-10-CM | POA: Diagnosis present

## 2021-12-18 DIAGNOSIS — R0609 Other forms of dyspnea: Secondary | ICD-10-CM

## 2021-12-18 NOTE — Patient Instructions (Signed)
Patient Instructions  Patient Details  Name: Mariah Poole MRN: [id number] Date of Birth: May 29, 1946 Referring Provider:  Tanda Rockers, MD  Below are your personal goals for exercise, nutrition, and risk factors. Our goal is to help you stay on track towards obtaining and maintaining these goals. We will be discussing your progress on these goals with you throughout the program.  Initial Exercise Prescription:  Initial Exercise Prescription - 12/18/21 1500       Date of Initial Exercise RX and Referring Provider   Date 12/18/21    Referring Provider Dr. Christinia Gully      Oxygen   Maintain Oxygen Saturation 88% or higher      Treadmill   MPH 0.8    Grade 0    Minutes 15    METs 1      NuStep   Level 1    SPM 80    Minutes 15    METs 1      Biostep-RELP   Level 1    SPM 50    Minutes 15    METs 1      Track   Laps 6    Minutes 15    METs 1.3      Prescription Details   Frequency (times per week) 3    Duration Progress to 30 minutes of continuous aerobic without signs/symptoms of physical distress      Intensity   THRR 40-80% of Max Heartrate 106-131    Ratings of Perceived Exertion 11-13    Perceived Dyspnea 0-4      Progression   Progression Continue to progress workloads to maintain intensity without signs/symptoms of physical distress.      Resistance Training   Training Prescription Yes    Weight 2    Reps 10-15             Exercise Goals: Frequency: Be able to perform aerobic exercise two to three times per week in program working toward 2-5 days per week of home exercise.  Intensity: Work with a perceived exertion of 11 (fairly light) - 15 (hard) while following your exercise prescription.  We will make changes to your prescription with you as you progress through the program.   Duration: Be able to do 30 to 45 minutes of continuous aerobic exercise in addition to a 5 minute warm-up and a 5 minute cool-down routine.   Nutrition  Goals: Your personal nutrition goals will be established when you do your nutrition analysis with the dietician.  The following are general nutrition guidelines to follow: Cholesterol < 200mg /day Sodium < 1500mg /day Fiber: Women over 50 yrs - 21 grams per day  Personal Goals:  Personal Goals and Risk Factors at Admission - 12/18/21 1556       Core Components/Risk Factors/Patient Goals on Admission    Weight Management Yes;Weight Loss    Intervention Weight Management: Develop a combined nutrition and exercise program designed to reach desired caloric intake, while maintaining appropriate intake of nutrient and fiber, sodium and fats, and appropriate energy expenditure required for the weight goal.;Weight Management: Provide education and appropriate resources to help participant work on and attain dietary goals.;Weight Management/Obesity: Establish reasonable short term and long term weight goals.;Obesity: Provide education and appropriate resources to help participant work on and attain dietary goals.    Admit Weight 200 lb 11.2 oz (91 kg)    Goal Weight: Short Term 195 lb (88.5 kg)    Goal Weight: Long Term 190 lb (  86.2 kg)    Expected Outcomes Short Term: Continue to assess and modify interventions until short term weight is achieved;Long Term: Adherence to nutrition and physical activity/exercise program aimed toward attainment of established weight goal;Understanding recommendations for meals to include 15-35% energy as protein, 25-35% energy from fat, 35-60% energy from carbohydrates, less than 200mg  of dietary cholesterol, 20-35 gm of total fiber daily;Understanding of distribution of calorie intake throughout the day with the consumption of 4-5 meals/snacks;Weight Loss: Understanding of general recommendations for a balanced deficit meal plan, which promotes 1-2 lb weight loss per week and includes a negative energy balance of [id number] kcal/d    Improve shortness of breath with ADL's Yes     Intervention Provide education, individualized exercise plan and daily activity instruction to help decrease symptoms of SOB with activities of daily living.    Expected Outcomes Short Term: Improve cardiorespiratory fitness to achieve a reduction of symptoms when performing ADLs;Long Term: Be able to perform more ADLs without symptoms or delay the onset of symptoms    Heart Failure Yes    Intervention Provide a combined exercise and nutrition program that is supplemented with education, support and counseling about heart failure. Directed toward relieving symptoms such as shortness of breath, decreased exercise tolerance, and extremity edema.    Expected Outcomes Improve functional capacity of life;Short term: Attendance in program 2-3 days a week with increased exercise capacity. Reported lower sodium intake. Reported increased fruit and vegetable intake. Reports medication compliance.;Short term: Daily weights obtained and reported for increase. Utilizing diuretic protocols set by physician.;Long term: Adoption of self-care skills and reduction of barriers for early signs and symptoms recognition and intervention leading to self-care maintenance.    Hypertension Yes    Intervention Provide education on lifestyle modifcations including regular physical activity/exercise, weight management, moderate sodium restriction and increased consumption of fresh fruit, vegetables, and low fat dairy, alcohol moderation, and smoking cessation.;Monitor prescription use compliance.    Expected Outcomes Short Term: Continued assessment and intervention until BP is < 140/41mm HG in hypertensive participants. < 130/63mm HG in hypertensive participants with diabetes, heart failure or chronic kidney disease.;Long Term: Maintenance of blood pressure at goal levels.    Lipids Yes    Intervention Provide education and support for participant on nutrition & aerobic/resistive exercise along with prescribed medications to  achieve LDL 70mg , HDL >40mg .    Expected Outcomes Short Term: Participant states understanding of desired cholesterol values and is compliant with medications prescribed. Participant is following exercise prescription and nutrition guidelines.;Long Term: Cholesterol controlled with medications as prescribed, with individualized exercise RX and with personalized nutrition plan. Value goals: LDL < 70mg , HDL > 40 mg.             Tobacco Use Initial Evaluation: Social History   Tobacco Use  Smoking Status Never  Smokeless Tobacco Never  Tobacco Comments   does not smoke     Exercise Goals and Review:  Exercise Goals     Row Name 12/18/21 1554             Exercise Goals   Increase Physical Activity Yes       Intervention Provide advice, education, support and counseling about physical activity/exercise needs.;Develop an individualized exercise prescription for aerobic and resistive training based on initial evaluation findings, risk stratification, comorbidities and participant's personal goals.       Expected Outcomes Short Term: Attend rehab on a regular basis to increase amount of physical activity.;Long Term: Add in home exercise to  make exercise part of routine and to increase amount of physical activity.;Long Term: Exercising regularly at least 3-5 days a week.       Increase Strength and Stamina Yes       Intervention Provide advice, education, support and counseling about physical activity/exercise needs.;Develop an individualized exercise prescription for aerobic and resistive training based on initial evaluation findings, risk stratification, comorbidities and participant's personal goals.       Expected Outcomes Short Term: Increase workloads from initial exercise prescription for resistance, speed, and METs.;Short Term: Perform resistance training exercises routinely during rehab and add in resistance training at home;Long Term: Improve cardiorespiratory fitness, muscular  endurance and strength as measured by increased METs and functional capacity (6MWT)       Able to understand and use rate of perceived exertion (RPE) scale Yes       Intervention Provide education and explanation on how to use RPE scale       Expected Outcomes Short Term: Able to use RPE daily in rehab to express subjective intensity level;Long Term:  Able to use RPE to guide intensity level when exercising independently       Able to understand and use Dyspnea scale Yes       Intervention Provide education and explanation on how to use Dyspnea scale       Expected Outcomes Short Term: Able to use Dyspnea scale daily in rehab to express subjective sense of shortness of breath during exertion;Long Term: Able to use Dyspnea scale to guide intensity level when exercising independently       Knowledge and understanding of Target Heart Rate Range (THRR) Yes       Intervention Provide education and explanation of THRR including how the numbers were predicted and where they are located for reference       Expected Outcomes Short Term: Able to state/look up THRR;Long Term: Able to use THRR to govern intensity when exercising independently;Short Term: Able to use daily as guideline for intensity in rehab       Able to check pulse independently Yes       Intervention Provide education and demonstration on how to check pulse in carotid and radial arteries.;Review the importance of being able to check your own pulse for safety during independent exercise       Expected Outcomes Short Term: Able to explain why pulse checking is important during independent exercise;Long Term: Able to check pulse independently and accurately       Understanding of Exercise Prescription Yes       Intervention Provide education, explanation, and written materials on patient's individual exercise prescription       Expected Outcomes Short Term: Able to explain program exercise prescription;Long Term: Able to explain home exercise  prescription to exercise independently                Copy of goals given to participant.

## 2021-12-18 NOTE — Progress Notes (Signed)
Pulmonary Individual Treatment Plan  Patient Details  Name: Mariah Poole MRN: [id number] Date of Birth: 23-Sep-1946 Referring Provider:   Flowsheet Row Pulmonary Rehab from 12/18/2021 in [facility] Cardiac and Pulmonary Rehab  Referring Provider Dr. Christinia Gully       Initial Encounter Date:  Flowsheet Row Pulmonary Rehab from 12/18/2021 in [facility] Cardiac and Pulmonary Rehab  Date 12/18/21       Visit Diagnosis: Post-COVID chronic dyspnea  Patient's Home Medications on Admission:  Current Outpatient Medications:    albuterol (PROVENTIL HFA;VENTOLIN HFA) 108 (90 BASE) MCG/ACT inhaler, Inhale 2 puffs into the lungs every 4 (four) hours as needed for wheezing or shortness of breath., Disp: , Rfl:    atenolol (TENORMIN) 25 MG tablet, Take 25 mg by mouth daily. , Disp: , Rfl:    budesonide-formoterol (SYMBICORT) 160-4.5 MCG/ACT inhaler, Inhale into the lungs., Disp: , Rfl:    Cholecalciferol (VITAMIN D3) 2000 UNITS TABS, Take 1 tablet by mouth daily., Disp: , Rfl:    DULoxetine (CYMBALTA) 60 MG capsule, Take 60 mg by mouth daily., Disp: , Rfl:    famotidine (PEPCID) 20 MG tablet, One after supper, Disp: 30 tablet, Rfl: 11   FEROSUL 325 (65 Fe) MG tablet, Take 325 mg by mouth daily., Disp: , Rfl:    furosemide (LASIX) 40 MG tablet, Take 40 mg by mouth 2 (two) times daily., Disp: , Rfl:    HYDROcodone-acetaminophen (NORCO) 10-325 MG tablet, Take 1 tablet by mouth every 8 (eight) hours. Must last 30 days, Disp: 90 tablet, Rfl: 0   HYDROcodone-acetaminophen (NORCO) 10-325 MG tablet, Take 1 tablet by mouth every 8 (eight) hours. Must last 30 days, Disp: 90 tablet, Rfl: 0   HYDROcodone-acetaminophen (NORCO) 10-325 MG tablet, Take 1 tablet by mouth every 8 (eight) hours. Must last 30 days (Patient not taking: Reported on 11/23/2021), Disp: 90 tablet, Rfl: 0   losartan (COZAAR) 25 MG tablet, Take 25 mg by mouth daily., Disp: , Rfl:    pantoprazole (PROTONIX) 40 MG tablet, Take 1 tablet (40 mg total) by  mouth daily., Disp: 30 tablet, Rfl: 2   potassium chloride SA (K-DUR,KLOR-CON) 20 MEQ tablet, Take 20 mEq by mouth daily. , Disp: , Rfl:    pravastatin (PRAVACHOL) 20 MG tablet, Take 20 mg by mouth at bedtime. Reported on 12/13/2015, Disp: , Rfl:    spironolactone (ALDACTONE) 25 MG tablet, Take 12.5 mg by mouth daily., Disp: , Rfl:    SYMBICORT 160-4.5 MCG/ACT inhaler, Take 2 puffs first thing in am and then another 2 puffs about 12 hours later., Disp: 1 each, Rfl: 11   Turmeric Curcumin 500 MG CAPS, Take by mouth daily. , Disp: , Rfl:    vitamin C (ASCORBIC ACID) 500 MG tablet, Take 500 mg by mouth daily., Disp: , Rfl:   Past Medical History: Past Medical History:  Diagnosis Date   Anemia    Arthritis    COPD (chronic obstructive pulmonary disease) ([place])    Depression    Diabetes mellitus without complication (HCC)    GERD (gastroesophageal reflux disease)    Hiatal hernia    History of contrast media allergy. (IVP dye) 09/01/2015   History of hiatal hernia    Hypercholesteremia    Hypertension    Morbid obesity (HCC)    Scoliosis    Shortness of breath dyspnea    with exertion    Tobacco Use: Social History   Tobacco Use  Smoking Status Never  Smokeless Tobacco Never  Tobacco Comments  does not smoke     Labs: Recent Review Flowsheet Data     Labs for ITP Cardiac and Pulmonary Rehab Latest Ref Rng & Units 11/23/2009 05/20/2017   Hemoglobin A1c 4.8 - 5.6 % - 6.1(H)   TCO2 0 - 100 mmol/L 29 -        Pulmonary Assessment Scores:  Pulmonary Assessment Scores     Row Name 12/18/21 1601         ADL UCSD   SOB Score total 59     Rest 0     Walk 1     Stairs 3     Bath 1     Dress 2     Shop 3       CAT Score   CAT Score 10       mMRC Score   mMRC Score 2              UCSD: Self-administered rating of dyspnea associated with activities of daily living (ADLs) 6-point scale (0 = "not at all" to 5 = "maximal or unable to do because of breathlessness")   Scoring Scores range from 0 to 120.  Minimally important difference is 5 units  CAT: CAT can identify the health impairment of COPD patients and is better correlated with disease progression.  CAT has a scoring range of zero to 40. The CAT score is classified into four groups of low (less than 10), medium (10 - 20), high (21-30) and very high (31-40) based on the impact level of disease on health status. A CAT score over 10 suggests significant symptoms.  A worsening CAT score could be explained by an exacerbation, poor medication adherence, poor inhaler technique, or progression of COPD or comorbid conditions.  CAT MCID is 2 points  mMRC: mMRC (Modified Medical Research Council) Dyspnea Scale is used to assess the degree of baseline functional disability in patients of respiratory disease due to dyspnea. No minimal important difference is established. A decrease in score of 1 point or greater is considered a positive change.   Pulmonary Function Assessment:  Pulmonary Function Assessment - 11/23/21 0908       Breath   Shortness of Breath Yes;Limiting activity             Exercise Target Goals: Exercise Program Goal: Individual exercise prescription set using results from initial 6 min walk test and THRR while considering  patients activity barriers and safety.   Exercise Prescription Goal: Initial exercise prescription builds to 30-45 minutes a day of aerobic activity, 2-3 days per week.  Home exercise guidelines will be given to patient during program as part of exercise prescription that the participant will acknowledge.  Education: Aerobic Exercise: - Group verbal and visual presentation on the components of exercise prescription. Introduces F.I.T.T principle from ACSM for exercise prescriptions.  Reviews F.I.T.T. principles of aerobic exercise including progression. Written material given at graduation.   Education: Resistance Exercise: - Group verbal and visual  presentation on the components of exercise prescription. Introduces F.I.T.T principle from ACSM for exercise prescriptions  Reviews F.I.T.T. principles of resistance exercise including progression. Written material given at graduation.    Education: Exercise & Equipment Safety: - Individual verbal instruction and demonstration of equipment use and safety with use of the equipment. Flowsheet Row Pulmonary Rehab from 12/18/2021 in [facility] Cardiac and Pulmonary Rehab  Date 12/18/21  Educator [facility]  Instruction Review Code 1- [employer] Understanding       Education: Exercise Physiology & General  Exercise Guidelines: - Group verbal and written instruction with models to review the exercise physiology of the cardiovascular system and associated critical values. Provides general exercise guidelines with specific guidelines to those with heart or lung disease.    Education: Flexibility, Balance, Mind/Body Relaxation: - Group verbal and visual presentation with interactive activity on the components of exercise prescription. Introduces F.I.T.T principle from ACSM for exercise prescriptions. Reviews F.I.T.T. principles of flexibility and balance exercise training including progression. Also discusses the mind body connection.  Reviews various relaxation techniques to help reduce and manage stress (i.e. Deep breathing, progressive muscle relaxation, and visualization). Balance handout provided to take home. Written material given at graduation.   Activity Barriers & Risk Stratification:   6 Minute Walk:  6 Minute Walk     Row Name 12/18/21 1547         6 Minute Walk   Phase Initial     Distance 600 feet     Walk Time 6 minutes     # of Rest Breaks 0     MPH 1.14     METS 0.74     RPE 11     Perceived Dyspnea  1     VO2 Peak 2.58     Symptoms No     Resting HR 82 bpm     Resting BP 104/60     Resting Oxygen Saturation  95 %     Exercise Oxygen Saturation  during 6 min walk 88 %     Max  Ex. HR 110 bpm     Max Ex. BP 152/82     2 Minute Post BP 124/80       Interval HR   1 Minute HR 94     2 Minute HR 70     3 Minute HR 61     4 Minute HR 91     5 Minute HR 71     6 Minute HR 110     2 Minute Post HR 89     Interval Heart Rate? Yes       Interval Oxygen   Interval Oxygen? Yes     Baseline Oxygen Saturation % 95 %     1 Minute Oxygen Saturation % 91 %     1 Minute Liters of Oxygen 0 L     2 Minute Oxygen Saturation % 88 %     2 Minute Liters of Oxygen 0 L     3 Minute Oxygen Saturation % 88 %     3 Minute Liters of Oxygen 0 L     4 Minute Oxygen Saturation % 90 %     4 Minute Liters of Oxygen 0 L     5 Minute Oxygen Saturation % 90 %     5 Minute Liters of Oxygen 0 L     6 Minute Oxygen Saturation % 90 %     6 Minute Liters of Oxygen 0 L     2 Minute Post Oxygen Saturation % 94 %     2 Minute Post Liters of Oxygen 0 L             Oxygen Initial Assessment:  Oxygen Initial Assessment - 12/18/21 1603       Home Oxygen   Home Oxygen Device None    Sleep Oxygen Prescription None    Home Exercise Oxygen Prescription None    Home Resting Oxygen Prescription None    Compliance with Home Oxygen Use  Yes      Initial 6 min Walk   Oxygen Used None      Program Oxygen Prescription   Program Oxygen Prescription None      Intervention   Short Term Goals To learn and understand importance of monitoring SPO2 with pulse oximeter and demonstrate accurate use of the pulse oximeter.;To learn and understand importance of maintaining oxygen saturations>88%;To learn and demonstrate proper pursed lip breathing techniques or other breathing techniques. ;To learn and demonstrate proper use of respiratory medications    Long  Term Goals Verbalizes importance of monitoring SPO2 with pulse oximeter and return demonstration;Maintenance of O2 saturations>88%;Exhibits proper breathing techniques, such as pursed lip breathing or other method taught during program  session;Compliance with respiratory medication;Demonstrates proper use of MDIs             Oxygen Re-Evaluation:   Oxygen Discharge (Final Oxygen Re-Evaluation):   Initial Exercise Prescription:  Initial Exercise Prescription - 12/18/21 1500       Date of Initial Exercise RX and Referring Provider   Date 12/18/21    Referring Provider Dr. Christinia Gully      Oxygen   Maintain Oxygen Saturation 88% or higher      Treadmill   MPH 0.8    Grade 0    Minutes 15    METs 1      NuStep   Level 1    SPM 80    Minutes 15    METs 1      Biostep-RELP   Level 1    SPM 50    Minutes 15    METs 1      Track   Laps 6    Minutes 15    METs 1.3      Prescription Details   Frequency (times per week) 3    Duration Progress to 30 minutes of continuous aerobic without signs/symptoms of physical distress      Intensity   THRR 40-80% of Max Heartrate 106-131    Ratings of Perceived Exertion 11-13    Perceived Dyspnea 0-4      Progression   Progression Continue to progress workloads to maintain intensity without signs/symptoms of physical distress.      Resistance Training   Training Prescription Yes    Weight 2    Reps 10-15             Perform Capillary Blood Glucose checks as needed.  Exercise Prescription Changes:   Exercise Prescription Changes     Row Name 12/18/21 1500             Response to Exercise   Blood Pressure (Admit) 104/60       Blood Pressure (Exercise) 152/82       Blood Pressure (Exit) 112/64       Heart Rate (Admit) 82 bpm       Heart Rate (Exercise) 110 bpm       Heart Rate (Exit) 81 bpm       Oxygen Saturation (Admit) 95 %       Oxygen Saturation (Exercise) 88 %       Oxygen Saturation (Exit) 92 %       Rating of Perceived Exertion (Exercise) 11       Perceived Dyspnea (Exercise) 1       Symptoms none       Comments 6 MWT results                Exercise  Comments:   Exercise Goals and Review:   Exercise Goals      Row Name 12/18/21 1554             Exercise Goals   Increase Physical Activity Yes       Intervention Provide advice, education, support and counseling about physical activity/exercise needs.;Develop an individualized exercise prescription for aerobic and resistive training based on initial evaluation findings, risk stratification, comorbidities and participant's personal goals.       Expected Outcomes Short Term: Attend rehab on a regular basis to increase amount of physical activity.;Long Term: Add in home exercise to make exercise part of routine and to increase amount of physical activity.;Long Term: Exercising regularly at least 3-5 days a week.       Increase Strength and Stamina Yes       Intervention Provide advice, education, support and counseling about physical activity/exercise needs.;Develop an individualized exercise prescription for aerobic and resistive training based on initial evaluation findings, risk stratification, comorbidities and participant's personal goals.       Expected Outcomes Short Term: Increase workloads from initial exercise prescription for resistance, speed, and METs.;Short Term: Perform resistance training exercises routinely during rehab and add in resistance training at home;Long Term: Improve cardiorespiratory fitness, muscular endurance and strength as measured by increased METs and functional capacity (6MWT)       Able to understand and use rate of perceived exertion (RPE) scale Yes       Intervention Provide education and explanation on how to use RPE scale       Expected Outcomes Short Term: Able to use RPE daily in rehab to express subjective intensity level;Long Term:  Able to use RPE to guide intensity level when exercising independently       Able to understand and use Dyspnea scale Yes       Intervention Provide education and explanation on how to use Dyspnea scale       Expected Outcomes Short Term: Able to use Dyspnea scale daily in rehab to  express subjective sense of shortness of breath during exertion;Long Term: Able to use Dyspnea scale to guide intensity level when exercising independently       Knowledge and understanding of Target Heart Rate Range (THRR) Yes       Intervention Provide education and explanation of THRR including how the numbers were predicted and where they are located for reference       Expected Outcomes Short Term: Able to state/look up THRR;Long Term: Able to use THRR to govern intensity when exercising independently;Short Term: Able to use daily as guideline for intensity in rehab       Able to check pulse independently Yes       Intervention Provide education and demonstration on how to check pulse in carotid and radial arteries.;Review the importance of being able to check your own pulse for safety during independent exercise       Expected Outcomes Short Term: Able to explain why pulse checking is important during independent exercise;Long Term: Able to check pulse independently and accurately       Understanding of Exercise Prescription Yes       Intervention Provide education, explanation, and written materials on patient's individual exercise prescription       Expected Outcomes Short Term: Able to explain program exercise prescription;Long Term: Able to explain home exercise prescription to exercise independently                Exercise Goals  Re-Evaluation :   Discharge Exercise Prescription (Final Exercise Prescription Changes):  Exercise Prescription Changes - 12/18/21 1500       Response to Exercise   Blood Pressure (Admit) 104/60    Blood Pressure (Exercise) 152/82    Blood Pressure (Exit) 112/64    Heart Rate (Admit) 82 bpm    Heart Rate (Exercise) 110 bpm    Heart Rate (Exit) 81 bpm    Oxygen Saturation (Admit) 95 %    Oxygen Saturation (Exercise) 88 %    Oxygen Saturation (Exit) 92 %    Rating of Perceived Exertion (Exercise) 11    Perceived Dyspnea (Exercise) 1    Symptoms  none    Comments 6 MWT results             Nutrition:  Target Goals: Understanding of nutrition guidelines, daily intake of sodium 1500mg , cholesterol 200mg , calories 30% from fat and 7% or less from saturated fats, daily to have 5 or more servings of fruits and vegetables.  Education: All About Nutrition: -Group instruction provided by verbal, written material, interactive activities, discussions, models, and posters to present general guidelines for heart healthy nutrition including fat, fiber, MyPlate, the role of sodium in heart healthy nutrition, utilization of the nutrition label, and utilization of this knowledge for meal planning. Follow up email sent as well. Written material given at graduation.   Biometrics:  Pre Biometrics - 12/18/21 1555       Pre Biometrics   Height 4\' 9"  (1.448 m)    Weight 200 lb 11.2 oz (91 kg)    BMI (Calculated) 43.42    Single Leg Stand 0 seconds              Nutrition Therapy Plan and Nutrition Goals:  Nutrition Therapy & Goals - 12/18/21 1557       Intervention Plan   Intervention Prescribe, educate and counsel regarding individualized specific dietary modifications aiming towards targeted core components such as weight, hypertension, lipid management, diabetes, heart failure and other comorbidities.    Expected Outcomes Short Term Goal: Understand basic principles of dietary content, such as calories, fat, sodium, cholesterol and nutrients.;Long Term Goal: Adherence to prescribed nutrition plan.;Short Term Goal: A plan has been developed with personal nutrition goals set during dietitian appointment.             Nutrition Assessments:  MEDIFICTS Score Key: ?70 Need to make dietary changes  40-70 Heart Healthy Diet ? 40 Therapeutic Level Cholesterol Diet  Flowsheet Row Pulmonary Rehab from 12/18/2021 in [facility] Cardiac and Pulmonary Rehab  Picture Your Plate Total Score on Admission 62      Picture Your Plate Scores: [id number]  Unhealthy dietary pattern with much room for improvement. 41-50 Dietary pattern unlikely to meet recommendations for good health and room for improvement. 51-60 More healthful dietary pattern, with some room for improvement.  >60 Healthy dietary pattern, although there may be some specific behaviors that could be improved.   Nutrition Goals Re-Evaluation:   Nutrition Goals Discharge (Final Nutrition Goals Re-Evaluation):   Psychosocial: Target Goals: Acknowledge presence or absence of significant depression and/or stress, maximize coping skills, provide positive support system. Participant is able to verbalize types and ability to use techniques and skills needed for reducing stress and depression.   Education: Stress, Anxiety, and Depression - Group verbal and visual presentation to define topics covered.  Reviews how body is impacted by stress, anxiety, and depression.  Also discusses healthy ways to reduce stress and to treat/manage  anxiety and depression.  Written material given at graduation.   Education: Sleep Hygiene -Provides group verbal and written instruction about how sleep can affect your health.  Define sleep hygiene, discuss sleep cycles and impact of sleep habits. Review good sleep hygiene tips.    Initial Review & Psychosocial Screening:  Initial Psych Review & Screening - 11/23/21 0909       Initial Review   Current issues with Current Psychotropic Meds      Family Dynamics   Good Support System? Yes    Comments She can look to her daughter who lives with her, sister, son and brother who lives in town. She has 4 grand children. 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      Barriers   Psychosocial barriers to participate in program The patient should benefit from training in stress management and relaxation.      Screening Interventions   Interventions Encouraged to exercise;Program counselor consult;To provide support and resources with identified  psychosocial needs;Provide feedback about the scores to participant    Expected Outcomes Short Term goal: Utilizing psychosocial counselor, staff and physician to assist with identification of specific Stressors or current issues interfering with healing process. Setting desired goal for each stressor or current issue identified.;Long Term Goal: Stressors or current issues are controlled or eliminated.;Short Term goal: Identification and review with participant of any Quality of Life or Depression concerns found by scoring the questionnaire.;Long Term goal: The participant improves quality of Life and PHQ9 Scores as seen by post scores and/or verbalization of changes             Quality of Life Scores:  Scores of 19 and below usually indicate a poorer quality of life in these areas.  A difference of  2-3 points is a clinically meaningful difference.  A difference of 2-3 points in the total score of the Quality of Life Index has been associated with significant improvement in overall quality of life, self-image, physical symptoms, and general health in studies assessing change in quality of life.  PHQ-9: Recent Review Flowsheet Data     Depression screen [facility] 2/9 12/18/2021 07/18/2021 01/14/2019 12/25/2018 11/27/2018   Decreased Interest 0 0 0 0 0   Down, Depressed, Hopeless 0 0 0 0 0   PHQ - 2 Score 0 0 0 0 0   Altered sleeping 0 - - - -   Tired, decreased energy 3 - - - -   Change in appetite 0 - - - -   Feeling bad or failure about yourself  1 - - - -   Trouble concentrating 1 - - - -   Moving slowly or fidgety/restless 0 - - - -   Suicidal thoughts 0 - - - -   PHQ-9 Score 5 - - - -   Difficult doing work/chores Somewhat difficult - - - -      Interpretation of Total Score  Total Score Depression Severity:  1-4 = Minimal depression, 5-9 = Mild depression, 10-14 = Moderate depression, 15-19 = Moderately severe depression, 20-27 = Severe depression   Psychosocial Evaluation and  Intervention:  Psychosocial Evaluation - 11/23/21 0913       Psychosocial Evaluation & Interventions   Interventions Encouraged to exercise with the program and follow exercise prescription;Relaxation education;Stress management education    Comments She can look to her daughter who lives with her, sister, son and brother who lives in town. She has 4 grand children. 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    Expected Outcomes Short: Start [facility] to help with mood. Long: Maintain a healthy mental state.    Continue Psychosocial Services  Follow up required by staff             Psychosocial Re-Evaluation:   Psychosocial Discharge (Final Psychosocial Re-Evaluation):   Education: Education Goals: Education classes will be provided on a weekly basis, covering required topics. Participant will state understanding/return demonstration of topics presented.  Learning Barriers/Preferences:  Learning Barriers/Preferences - 11/23/21 0908       Learning Barriers/Preferences   Learning Barriers None    Learning Preferences None             General Pulmonary Education Topics:  Infection Prevention: - Provides verbal and written material to individual with discussion of infection control including proper hand washing and proper equipment cleaning during exercise session. Flowsheet Row Pulmonary Rehab from 12/18/2021 in [facility] Cardiac and Pulmonary Rehab  Date 12/18/21  Educator [facility]  Instruction Review Code 1- Verbalizes Understanding       Falls Prevention: - Provides verbal and written material to individual with discussion of falls prevention and safety. Flowsheet Row Pulmonary Rehab from 12/18/2021 in [facility] Cardiac and Pulmonary Rehab  Date 12/18/21  Educator [facility]  Instruction Review Code 1- Verbalizes Understanding       Chronic Lung Disease Review: - Group verbal instruction with posters, models, PowerPoint presentations and videos,  to review new updates, new  respiratory medications, new advancements in procedures and treatments. Providing information on websites and "800" numbers for continued self-education. Includes information about supplement oxygen, available portable oxygen systems, continuous and intermittent flow rates, oxygen safety, concentrators, and Medicare reimbursement for oxygen. Explanation of Pulmonary Drugs, including class, frequency, complications, importance of spacers, rinsing mouth after steroid MDI's, and proper cleaning methods for nebulizers. Review of basic lung anatomy and physiology related to function, structure, and complications of lung disease. Review of risk factors. Discussion about methods for diagnosing sleep apnea and types of masks and machines for OSA. Includes a review of the use of types of environmental controls: home humidity, furnaces, filters, dust mite/pet prevention, HEPA vacuums. Discussion about weather changes, air quality and the benefits of nasal washing. Instruction on Warning signs, infection symptoms, calling MD promptly, preventive modes, and value of vaccinations. Review of effective airway clearance, coughing and/or vibration techniques. Emphasizing that all should Create an Action Plan. Written material given at graduation. Flowsheet Row Pulmonary Rehab from 12/18/2021 in [facility] Cardiac and Pulmonary Rehab  Education need identified 12/18/21       AED/CPR: - Group verbal and written instruction with the use of models to demonstrate the basic use of the AED with the basic ABC's of resuscitation.    Anatomy and Cardiac Procedures: - Group verbal and visual presentation and models provide information about basic cardiac anatomy and function. Reviews the testing methods done to diagnose heart disease and the outcomes of the test results. Describes the treatment choices: Medical Management, Angioplasty, or Coronary Bypass Surgery for treating various heart conditions including Myocardial Infarction,  Angina, Valve Disease, and Cardiac Arrhythmias.  Written material given at graduation.   Medication Safety: - Group verbal and visual instruction to review commonly prescribed medications for heart and lung disease. Reviews the medication, class of the drug, and side effects. Includes the steps to properly store meds and maintain the prescription regimen.  Written material given at graduation.   Other: -Provides group and verbal instruction on various topics (see comments)   Knowledge  Questionnaire Score:  Knowledge Questionnaire Score - 12/18/21 1558       Knowledge Questionnaire Score   Pre Score 15/18              Core Components/Risk Factors/Patient Goals at Admission:  Personal Goals and Risk Factors at Admission - 12/18/21 1556       Core Components/Risk Factors/Patient Goals on Admission    Weight Management Yes;Weight Loss    Intervention Weight Management: Develop a combined nutrition and exercise program designed to reach desired caloric intake, while maintaining appropriate intake of nutrient and fiber, sodium and fats, and appropriate energy expenditure required for the weight goal.;Weight Management: Provide education and appropriate resources to help participant work on and attain dietary goals.;Weight Management/Obesity: Establish reasonable short term and long term weight goals.;Obesity: Provide education and appropriate resources to help participant work on and attain dietary goals.    Admit Weight 200 lb 11.2 oz (91 kg)    Goal Weight: Short Term 195 lb (88.5 kg)    Goal Weight: Long Term 190 lb (86.2 kg)    Expected Outcomes Short Term: Continue to assess and modify interventions until short term weight is achieved;Long Term: Adherence to nutrition and physical activity/exercise program aimed toward attainment of established weight goal;Understanding recommendations for meals to include 15-35% energy as protein, 25-35% energy from fat, 35-60% energy from  carbohydrates, less than 200mg  of dietary cholesterol, 20-35 gm of total fiber daily;Understanding of distribution of calorie intake throughout the day with the consumption of 4-5 meals/snacks;Weight Loss: Understanding of general recommendations for a balanced deficit meal plan, which promotes 1-2 lb weight loss per week and includes a negative energy balance of [phone] kcal/d    Improve shortness of breath with ADL's Yes    Intervention Provide education, individualized exercise plan and daily activity instruction to help decrease symptoms of SOB with activities of daily living.    Expected Outcomes Short Term: Improve cardiorespiratory fitness to achieve a reduction of symptoms when performing ADLs;Long Term: Be able to perform more ADLs without symptoms or delay the onset of symptoms    Heart Failure Yes    Intervention Provide a combined exercise and nutrition program that is supplemented with education, support and counseling about heart failure. Directed toward relieving symptoms such as shortness of breath, decreased exercise tolerance, and extremity edema.    Expected Outcomes Improve functional capacity of life;Short term: Attendance in program 2-3 days a week with increased exercise capacity. Reported lower sodium intake. Reported increased fruit and vegetable intake. Reports medication compliance.;Short term: Daily weights obtained and reported for increase. Utilizing diuretic protocols set by physician.;Long term: Adoption of self-care skills and reduction of barriers for early signs and symptoms recognition and intervention leading to self-care maintenance.    Hypertension Yes    Intervention Provide education on lifestyle modifcations including regular physical activity/exercise, weight management, moderate sodium restriction and increased consumption of fresh fruit, vegetables, and low fat dairy, alcohol moderation, and smoking cessation.;Monitor prescription use compliance.    Expected  Outcomes Short Term: Continued assessment and intervention until BP is < 140/10mm HG in hypertensive participants. < 130/27mm HG in hypertensive participants with diabetes, heart failure or chronic kidney disease.;Long Term: Maintenance of blood pressure at goal levels.    Lipids Yes    Intervention Provide education and support for participant on nutrition & aerobic/resistive exercise along with prescribed medications to achieve LDL 70mg , HDL >40mg .    Expected Outcomes Short Term: Participant states understanding of desired cholesterol values and  is compliant with medications prescribed. Participant is following exercise prescription and nutrition guidelines.;Long Term: Cholesterol controlled with medications as prescribed, with individualized exercise RX and with personalized nutrition plan. Value goals: LDL < 70mg , HDL > 40 mg.             Education:Diabetes - Individual verbal and written instruction to review signs/symptoms of diabetes, desired ranges of glucose level fasting, after meals and with exercise. Acknowledge that pre and post exercise glucose checks will be done for 3 sessions at entry of program.   Know Your Numbers and Heart Failure: - Group verbal and visual instruction to discuss disease risk factors for cardiac and pulmonary disease and treatment options.  Reviews associated critical values for Overweight/Obesity, Hypertension, Cholesterol, and Diabetes.  Discusses basics of heart failure: signs/symptoms and treatments.  Introduces Heart Failure Zone chart for action plan for heart failure.  Written material given at graduation.   Core Components/Risk Factors/Patient Goals Review:    Core Components/Risk Factors/Patient Goals at Discharge (Final Review):    ITP Comments:  ITP Comments     Row Name 11/23/21 0906 12/18/21 1545         ITP Comments Virtual Visit completed. Patient informed on EP and RD appointment and 6 Minute walk test. Patient also informed of  patient health questionnaires on My Chart. Patient Verbalizes understanding. Visit diagnosis can be found in [facility] 10/31/2021. Completed 6MWT and gym orientation. Initial ITP created and sent for review to Dr. Zetta Bills, Medical Director.               Comments: initial TIP

## 2021-12-20 ENCOUNTER — Encounter: Payer: Medicare PPO | Attending: Internal Medicine

## 2021-12-20 ENCOUNTER — Other Ambulatory Visit: Payer: Self-pay

## 2021-12-20 DIAGNOSIS — J45909 Unspecified asthma, uncomplicated: Secondary | ICD-10-CM | POA: Diagnosis not present

## 2021-12-20 DIAGNOSIS — R0609 Other forms of dyspnea: Secondary | ICD-10-CM | POA: Insufficient documentation

## 2021-12-20 DIAGNOSIS — U099 Post covid-19 condition, unspecified: Secondary | ICD-10-CM | POA: Diagnosis present

## 2021-12-20 NOTE — Progress Notes (Signed)
Daily Session Note  Patient Details  Name: Mariah Poole MRN: [id number] Date of Birth: 18-Oct-1946 Referring Provider:   Flowsheet Row Pulmonary Rehab from 12/18/2021 in [facility] Cardiac and Pulmonary Rehab  Referring Provider Dr. Christinia Gully       Encounter Date: 12/20/2021  Check In:  Session Check In - 12/20/21 1651       Check-In   Supervising physician immediately available to respond to emergencies See telemetry face sheet for immediately available ER MD    Location [facility]-Cardiac & Pulmonary Rehab    Staff Present Birdie Sons, MPA, Nino Glow, MS, ASCM CEP, Exercise Physiologist;Joseph Tessie Fass, [place]    Virtual Visit No    Medication changes reported     No    Fall or balance concerns reported    No    Warm-up and Cool-down Performed on first and last piece of equipment    Resistance Training Performed Yes    VAD Patient? No    PAD/SET Patient? No      Pain Assessment   Currently in Pain? No/denies                Social History   Tobacco Use  Smoking Status Never  Smokeless Tobacco Never  Tobacco Comments   does not smoke     Goals Met:  Independence with exercise equipment Exercise tolerated well No report of concerns or symptoms today Strength training completed today  Goals Unmet:  Not Applicable  Comments: First full day of exercise!  Patient was oriented to gym and equipment including functions, settings, policies, and procedures.  Patient's individual exercise prescription and treatment plan were reviewed.  All starting workloads were established based on the results of the 6 minute walk test done at initial orientation visit.  The plan for exercise progression was also introduced and progression will be customized based on patient's performance and goals.    Dr. Emily Filbert is Medical Director for [place].  Dr. Ottie Glazier is Medical Director for [facility] Pulmonary Rehabilitation.

## 2021-12-21 ENCOUNTER — Encounter: Payer: Medicare PPO | Admitting: *Deleted

## 2021-12-21 ENCOUNTER — Other Ambulatory Visit: Payer: Self-pay

## 2021-12-21 DIAGNOSIS — R0609 Other forms of dyspnea: Secondary | ICD-10-CM

## 2021-12-21 DIAGNOSIS — U099 Post covid-19 condition, unspecified: Secondary | ICD-10-CM

## 2021-12-21 NOTE — Progress Notes (Signed)
Daily Session Note  Patient Details  Name: Mariah Poole MRN: [id number] Date of Birth: 1946-01-03 Referring Provider:   April Manson Pulmonary Rehab from 12/18/2021 in [facility] Cardiac and Pulmonary Rehab  Referring Provider Dr. Christinia Gully       Encounter Date: 12/21/2021  Check In:  Session Check In - 12/21/21 1729       Check-In   Supervising physician immediately available to respond to emergencies See telemetry face sheet for immediately available ER MD    Location [facility]-Cardiac & Pulmonary Rehab    Staff Present Renita Papa, RN BSN;Joseph [place], RCP,RRT,BSRT;Amanda [place], [place], ACSM CEP, Exercise Physiologist    Virtual Visit No    Medication changes reported     No    Warm-up and Cool-down Performed on first and last piece of equipment    Resistance Training Performed Yes    VAD Patient? No    PAD/SET Patient? No      Pain Assessment   Currently in Pain? No/denies                Social History   Tobacco Use  Smoking Status Never  Smokeless Tobacco Never  Tobacco Comments   does not smoke     Goals Met:  Independence with exercise equipment Exercise tolerated well No report of concerns or symptoms today Strength training completed today  Goals Unmet:  Not Applicable  Comments: Pt able to follow exercise prescription today without complaint.  Will continue to monitor for progression.    Dr. Emily Filbert is Medical Director for [place].  Dr. Ottie Glazier is Medical Director for [facility] Pulmonary Rehabilitation.

## 2022-01-01 ENCOUNTER — Other Ambulatory Visit: Payer: Self-pay

## 2022-01-01 ENCOUNTER — Encounter: Payer: Medicare PPO | Admitting: *Deleted

## 2022-01-01 DIAGNOSIS — R0609 Other forms of dyspnea: Secondary | ICD-10-CM

## 2022-01-01 DIAGNOSIS — U099 Post covid-19 condition, unspecified: Secondary | ICD-10-CM

## 2022-01-01 NOTE — Progress Notes (Signed)
Daily Session Note  Patient Details  Name: Mariah Poole MRN: [id number] Date of Birth: August 22, 1946 Referring Provider:   Flowsheet Row Pulmonary Rehab from 12/18/2021 in [facility] Cardiac and Pulmonary Rehab  Referring Provider Dr. Christinia Gully       Encounter Date: 01/01/2022  Check In:  Session Check In - 01/01/22 1722       Check-In   Supervising physician immediately available to respond to emergencies See telemetry face sheet for immediately available ER MD    Location [facility]-Cardiac & Pulmonary Rehab    Staff Present Renita Papa, RN BSN;Joseph [place], RCP,RRT,BSRT;Kara [place], [place], ASCM CEP, Exercise Physiologist    Virtual Visit No    Medication changes reported     No    Fall or balance concerns reported    No    Warm-up and Cool-down Performed on first and last piece of equipment    Resistance Training Performed Yes    VAD Patient? No    PAD/SET Patient? No      Pain Assessment   Currently in Pain? No/denies                Social History   Tobacco Use  Smoking Status Never  Smokeless Tobacco Never  Tobacco Comments   does not smoke     Goals Met:  Independence with exercise equipment Exercise tolerated well No report of concerns or symptoms today Strength training completed today  Goals Unmet:  Not Applicable  Comments: Pt able to follow exercise prescription today without complaint.  Will continue to monitor for progression.    Dr. Emily Filbert is Medical Director for [place].  Dr. Ottie Glazier is Medical Director for [facility] Pulmonary Rehabilitation.

## 2022-01-03 ENCOUNTER — Other Ambulatory Visit: Payer: Self-pay

## 2022-01-03 DIAGNOSIS — R0609 Other forms of dyspnea: Secondary | ICD-10-CM | POA: Diagnosis not present

## 2022-01-03 NOTE — Progress Notes (Signed)
Daily Session Note  Patient Details  Name: LACEE GREY MRN: [id number] Date of Birth: 18-Nov-1946 Referring Provider:   Flowsheet Row Pulmonary Rehab from 12/18/2021 in [facility] Cardiac and Pulmonary Rehab  Referring Provider Dr. Christinia Gully       Encounter Date: 01/03/2022  Check In:  Session Check In - 01/03/22 1638       Check-In   Supervising physician immediately available to respond to emergencies See telemetry face sheet for immediately available ER MD    Location [facility]-Cardiac & Pulmonary Rehab    Staff Present Birdie Sons, MPA, Nino Glow, MS, ASCM CEP, Exercise Physiologist;Joseph Tessie Fass, [place]    Virtual Visit No    Medication changes reported     No    Fall or balance concerns reported    No    Warm-up and Cool-down Performed on first and last piece of equipment    Resistance Training Performed Yes    VAD Patient? No    PAD/SET Patient? No      Pain Assessment   Currently in Pain? No/denies                Social History   Tobacco Use  Smoking Status Never  Smokeless Tobacco Never  Tobacco Comments   does not smoke     Goals Met:  Independence with exercise equipment Exercise tolerated well No report of concerns or symptoms today Strength training completed today  Goals Unmet:  Not Applicable  Comments: Pt able to follow exercise prescription today without complaint.  Will continue to monitor for progression.    Dr. Emily Filbert is Medical Director for [place].  Dr. Ottie Glazier is Medical Director for [facility] Pulmonary Rehabilitation.

## 2022-01-04 ENCOUNTER — Encounter: Payer: Medicare PPO | Admitting: *Deleted

## 2022-01-04 ENCOUNTER — Other Ambulatory Visit: Payer: Self-pay

## 2022-01-04 DIAGNOSIS — R0609 Other forms of dyspnea: Secondary | ICD-10-CM

## 2022-01-04 NOTE — Progress Notes (Signed)
Daily Session Note  Patient Details  Name: Mariah Poole MRN: [id number] Date of Birth: 21-Feb-1946 Referring Provider:   Flowsheet Row Pulmonary Rehab from 12/18/2021 in [facility] Cardiac and Pulmonary Rehab  Referring Provider Dr. Christinia Gully       Encounter Date: 01/04/2022  Check In:  Session Check In - 01/04/22 1713       Check-In   Supervising physician immediately available to respond to emergencies See telemetry face sheet for immediately available ER MD    Location [facility]-Cardiac & Pulmonary Rehab    Staff Present Renita Papa, RN BSN;Joseph [place], RCP,RRT,BSRT;Kara [place], [place], ASCM CEP, Exercise Physiologist    Virtual Visit No    Medication changes reported     No    Fall or balance concerns reported    No    Warm-up and Cool-down Performed on first and last piece of equipment    Resistance Training Performed Yes    VAD Patient? No    PAD/SET Patient? No      Pain Assessment   Currently in Pain? No/denies                Social History   Tobacco Use  Smoking Status Never  Smokeless Tobacco Never  Tobacco Comments   does not smoke     Goals Met:  Independence with exercise equipment Exercise tolerated well No report of concerns or symptoms today Strength training completed today  Goals Unmet:  Not Applicable  Comments: Pt able to follow exercise prescription today without complaint.  Will continue to monitor for progression.    Dr. Emily Filbert is Medical Director for [place].  Dr. Ottie Glazier is Medical Director for [facility] Pulmonary Rehabilitation.

## 2022-01-08 ENCOUNTER — Encounter: Payer: Medicare PPO | Admitting: *Deleted

## 2022-01-08 ENCOUNTER — Other Ambulatory Visit: Payer: Self-pay

## 2022-01-08 DIAGNOSIS — U099 Post covid-19 condition, unspecified: Secondary | ICD-10-CM

## 2022-01-08 NOTE — Progress Notes (Signed)
Daily Session Note  Patient Details  Name: Mariah Poole MRN: [id number] Date of Birth: 01-10-1946 Referring Provider:   Flowsheet Row Pulmonary Rehab from 12/18/2021 in [facility] Cardiac and Pulmonary Rehab  Referring Provider Dr. Christinia Gully       Encounter Date: 01/08/2022  Check In:  Session Check In - 01/08/22 1727       Check-In   Supervising physician immediately available to respond to emergencies See telemetry face sheet for immediately available ER MD    Location [facility]-Cardiac & Pulmonary Rehab    Staff Present Renita Papa, RN BSN;Joseph [place], RCP,RRT,BSRT;Amanda [place], [place], ACSM CEP, Exercise Physiologist    Virtual Visit No    Medication changes reported     No    Fall or balance concerns reported    No    Warm-up and Cool-down Performed on first and last piece of equipment    Resistance Training Performed Yes    VAD Patient? No      Pain Assessment   Currently in Pain? No/denies                Social History   Tobacco Use  Smoking Status Never  Smokeless Tobacco Never  Tobacco Comments   does not smoke     Goals Met:  Independence with exercise equipment Exercise tolerated well No report of concerns or symptoms today Strength training completed today  Goals Unmet:  Not Applicable  Comments: Pt able to follow exercise prescription today without complaint.  Will continue to monitor for progression.    Dr. Emily Filbert is Medical Director for [place].  Dr. Ottie Glazier is Medical Director for [facility] Pulmonary Rehabilitation.

## 2022-01-09 NOTE — Progress Notes (Signed)
PROVIDER NOTE: Information contained herein reflects review and annotations entered in association with encounter. Interpretation of such information and data should be left to medically-trained personnel. Information provided to patient can be located elsewhere in the medical record under "Patient Instructions". Document created using STT-dictation technology, any transcriptional errors that may result from process are unintentional.    Patient: Mariah Poole  Service Category: E/M  Provider: Gaspar Cola, MD  DOB: 1946/06/19  DOS: 01/10/2022  Specialty: Interventional Pain Management  MRN: [id number]  Setting: Ambulatory outpatient  PCP: Marguerita Merles, MD  Type: Established Patient    Referring Provider: Marguerita Merles, MD  Location: Office  Delivery: Face-to-face     HPI  Mariah Poole, a 76 y.o. year old female, is here today because of her Chronic pain syndrome [G89.4]. Mariah Poole primary complain today is Back Pain and Arm Pain (left) Last encounter: My last encounter with her was on 10/11/2021. 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, L4-5, L5-S1) w/ neurogenic claudication; Lumbar foraminal stenosis (Multilevel) (Bilateral); Cervicalgia; DDD (degenerative disc disease), cervical; Cervical facet hypertrophy; Cervical facet joint syndrome; Lumbar facet hypertrophy; 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 Pain Assessment: Severity of Chronic pain is reported as a 7 /10. Location: Back Lower/radiates into right buttock. Onset: More than a month ago. Quality: Aching, Throbbing. Timing: Constant. Modifying factor(s): resting. Vitals:  height is 5' (1.524 m) and weight is 194 lb (88 kg). Her temperature is 96.6 F (35.9 C) (abnormal). Her blood pressure is 156/80 (abnormal) and her pulse is 86. Her respiration is 18 and oxygen saturation is 10% (abnormal).   Reason for encounter: medication management.   The patient indicates doing well with the current medication regimen. No adverse reactions or side effects reported to the medications.   Last face-to-face evaluation with this patient was on 10/11/2021.  RTCB: 04/14/2022 Nonopioids transferred 11/09/2020: Magnesium, Lyrica, and Mobic.  Pharmacotherapy Assessment  Analgesic: Hydrocodone/APAP 10/325, 1 tab PO q 8 hrs PRN (30 mg/day of hydrocodone) (975 mg/day of acetaminophen) MME/day: 30 mg/day.   Monitoring: [place] PMP: PDMP reviewed during this encounter.       Pharmacotherapy: No side-effects or adverse reactions reported. Compliance: No problems identified. Effectiveness: Clinically acceptable.  [name], RN  01/10/2022 11:52 AM  Sign when Signing Visit Nursing Pain Medication Assessment:  Safety precautions to be maintained throughout the outpatient stay will include: orient to surroundings, keep bed in low position, maintain call bell within reach at all times, provide assistance with transfer out of bed and ambulation.  Medication Inspection Compliance: Pill count conducted under aseptic conditions, in front of the patient. Neither the pills nor  the bottle was removed from the patient's sight at any time. Once count was completed pills were immediately returned to the patient in their original bottle.  Medication: Hydrocodone/APAP Pill/Patch Count:  8 of 90 pills remain Pill/Patch Appearance: Markings consistent with prescribed medication Bottle Appearance: Standard pharmacy container. Clearly labeled. Filled Date: 01 / 27 / 2023 Last Medication intake:  Today    UDS:  Summary  Date Value Ref Range Status  07/19/2021 Note  Final    Comment:    ==================================================================== [employer] (MW) ==================================================================== Test                             Result       Flag       Units  Drug Present and Declared for Prescription Verification   Hydrocodone                    696          EXPECTED   ng/mg creat   Hydromorphone                  42           EXPECTED   ng/mg creat   Dihydrocodeine                 332          EXPECTED   ng/mg creat   Norhydrocodone                 1671         EXPECTED   ng/mg creat    Sources of hydrocodone include scheduled prescription medications.    Hydromorphone, dihydrocodeine and norhydrocodone are expected    metabolites of hydrocodone. Hydromorphone and dihydrocodeine are    also available as scheduled prescription medications.  ==================================================================== Test                      Result    Flag   Units      Ref Range   Creatinine              195              mg/dL      >=20 ==================================================================== Declared Medications:  The flagging and interpretation on this report are based on the  following declared medications.  Unexpected results may arise from  inaccuracies in the declared medications.   **Note: The testing scope of this panel includes these medications:   Hydrocodone (Norco)   **Note: The testing scope  of this panel does not include the  following reported medications:   Acetaminophen (Norco)  Albuterol (Ventolin HFA)  Atenolol (Tenormin)  Budesonide (Symbicort)  Duloxetine (Cymbalta)  Formoterol (Symbicort)  Furosemide (Lasix)  Iron  Losartan (Cozaar)  Magnesium (Mag-Ox)  Meloxicam (Mobic)  Pantoprazole (Protonix)  Potassium (Klor-Con)  Pravastatin (Pravachol)  Pregabalin (Lyrica)  Spironolactone (Aldactone)  Turmeric  Vitamin C  Vitamin D3 ==================================================================== For clinical consultation, please call [phone]. ====================================================================      ROS  Constitutional: Denies any fever or chills Gastrointestinal: No reported hemesis, hematochezia, vomiting, or acute GI distress Musculoskeletal: Denies any acute onset joint swelling, redness, loss of ROM, or weakness Neurological: No reported episodes of acute onset apraxia, aphasia, dysarthria, agnosia, amnesia, paralysis, loss of coordination, or loss of consciousness  Medication Review  DULoxetine, HYDROcodone-acetaminophen, Turmeric Curcumin, Vitamin D3, albuterol, atenolol, budesonide-formoterol, famotidine, ferrous sulfate, furosemide, losartan, pantoprazole, potassium chloride SA, pravastatin, spironolactone, and vitamin C  History Review  Allergy: Mariah Poole is allergic to contrast media [iodinated contrast  media], ioxaglate, and shellfish allergy. Drug: Mariah Poole  reports current drug use. Alcohol:  reports no history of alcohol use. Tobacco:  reports that she has never smoked. She has never used smokeless tobacco. Social: Mariah Poole  reports that she has never smoked. She has never used smokeless tobacco. She reports current drug use. She reports that she does not drink alcohol. Medical:  has a past medical history of Anemia, Arthritis, COPD (chronic obstructive pulmonary disease) ([place]), Depression, Diabetes mellitus  without complication ([place]), GERD (gastroesophageal reflux disease), Hiatal hernia, History of contrast media allergy. (IVP dye) (09/01/2015), History of hiatal hernia, Hypercholesteremia, Hypertension, Morbid obesity ([place]), Scoliosis, and Shortness of breath dyspnea. Surgical: Mariah Poole  has a past surgical history that includes Abdominal hysterectomy; Cataract extraction w/ intraocular lens implant (Right); Gastric bypass; Hernia repair; Cataract extraction w/PHACO (Left, 07/19/2015); Cholecystectomy; and Hernia repair. Family: family history includes Diabetes in her mother; Heart disease in her father.  Laboratory Chemistry Profile   Renal Lab Results  Component Value Date   BUN 14 09/14/2021   CREATININE 1.02 (H) 09/14/2021   BCR 21 06/17/2019   GFRAA 53 (L) 06/17/2019   GFRNONAA 57 (L) 09/14/2021    Hepatic Lab Results  Component Value Date   AST 19 09/14/2021   ALT 11 09/14/2021   ALBUMIN 3.8 09/14/2021   ALKPHOS 66 09/14/2021   LIPASE 19 10/27/2015    Electrolytes Lab Results  Component Value Date   NA 140 09/14/2021   K 4.3 09/14/2021   CL 102 09/14/2021   CALCIUM 9.3 09/14/2021   MG 1.9 06/17/2019    Bone Lab Results  Component Value Date   25OHVITD1 45 06/17/2019   25OHVITD2 <1.0 06/17/2019   25OHVITD3 45 06/17/2019    Inflammation (CRP: Acute Phase) (ESR: Chronic Phase) Lab Results  Component Value Date   CRP 7 06/17/2019   ESRSEDRATE 49 (H) 06/17/2019   LATICACIDVEN 4.0 (H) 11/23/2009         Note: Above Lab results reviewed.  Recent Imaging Review  DG Sniff Test CLINICAL DATA:  Worsening breathing since COVID-19 infection in August.  EXAM: CHEST FLUOROSCOPY  TECHNIQUE: Real-time fluoroscopic evaluation of the chest was performed.  FLUOROSCOPY TIME:  Fluoroscopy Time:  0.6 minute  Radiation Exposure Index (if provided by the fluoroscopic device): 6.4 mGy  Number of Acquired Spot Images: 0  COMPARISON:  CT chest  11/23/2009  FINDINGS: Real-time fluoroscopy of the diaphragms was performed during inspiration, expiration and sniffing.  Elevation of the right diaphragm unchanged compared 11/23/2009.  The diaphragm moves appropriately during inspiration, expiration and sniffing, but less than expected.  Right basilar atelectasis.  IMPRESSION: Normal motion of the diaphragms, but the overall excursion is relatively decreased.  Electronically Signed   By: Kathreen Devoid M.D.   On: 09/22/2021 09:44 Note: Reviewed        Physical Exam  General appearance: Well nourished, well developed, and well hydrated. In no apparent acute distress Mental status: Alert, oriented x 3 (person, place, & time)       Respiratory: No evidence of acute respiratory distress Eyes: PERLA Vitals: BP (!) 156/80 (BP Location: Right Arm, Patient Position: Sitting, Cuff Size: Normal)    Pulse 86    Temp (!) 96.6 F (35.9 C)    Resp 18    Ht 5' (1.524 m)    Wt 194 lb (88 kg)    SpO2 (!) 10%    BMI 37.89 kg/m  BMI: Estimated body mass index is 37.89  kg/m as calculated from the following:   Height as of this encounter: 5' (1.524 m).   Weight as of this encounter: 194 lb (88 kg). Ideal: Ideal body weight: 45.5 kg (100 lb 4.9 oz) Adjusted ideal body weight: 62.5 kg (137 lb 12.6 oz)  Assessment   Status Diagnosis  Controlled Controlled Controlled 1. Chronic pain syndrome   2. Chronic low back pain (1ry area of Pain) (Bilateral) (R>L) w/o sciatica   3. DDD (degenerative disc disease), lumbar   4. Lumbar facet syndrome (Bilateral) (R>L)   5. 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 Chronic musculoskeletal pain   7. Chronic hip pain (Right)   8. Cervicalgia   9. Cervical facet joint syndrome   10. Rheumatoid arthritis involving multiple sites with positive rheumatoid factor ([place])   11. Pharmacologic therapy   12. Chronic use of opiate for therapeutic purpose   13. Encounter for  chronic pain management   14. Encounter for medication management      Updated Problems: No problems updated.  Plan of Care  Problem-specific:  No problem-specific Assessment & Plan notes found for this encounter.  Mariah Poole has a current medication list which includes the following long-term medication(s): albuterol, atenolol, budesonide-formoterol, duloxetine, famotidine, ferosul, furosemide, [START ON 01/14/2022] hydrocodone-acetaminophen, [START ON 02/13/2022] hydrocodone-acetaminophen, [START ON 03/15/2022] hydrocodone-acetaminophen, pantoprazole, potassium chloride sa, pravastatin, spironolactone, and symbicort.  Pharmacotherapy (Medications Ordered): Meds ordered this encounter  Medications   HYDROcodone-acetaminophen (NORCO) 10-325 MG tablet    Sig: Take 1 tablet by mouth every 8 (eight) hours. Must last 30 days    Dispense:  90 tablet    Refill:  0    DO NOT: delete (not duplicate); no partial-fill (will deny script to complete), no refill request (F/U required). DISPENSE: 1 day early if closed on fill date. WARN: No CNS-depressants within 8 hrs of med.   HYDROcodone-acetaminophen (NORCO) 10-325 MG tablet    Sig: Take 1 tablet by mouth every 8 (eight) hours. Must last 30 days    Dispense:  90 tablet    Refill:  0    DO NOT: delete (not duplicate); no partial-fill (will deny script to complete), no refill request (F/U required). DISPENSE: 1 day early if closed on fill date. WARN: No CNS-depressants within 8 hrs of med.   HYDROcodone-acetaminophen (NORCO) 10-325 MG tablet    Sig: Take 1 tablet by mouth every 8 (eight) hours. Must last 30 days    Dispense:  90 tablet    Refill:  0    DO NOT: delete (not duplicate); no partial-fill (will deny script to complete), no refill request (F/U required). DISPENSE: 1 day early if closed on fill date. WARN: No CNS-depressants within 8 hrs of med.   Orders:  Orders Placed This Encounter  Procedures   LUMBAR FACET(MEDIAL BRANCH  NERVE BLOCK) MBNB    Standing Status:   Future    Standing Expiration Date:   05/10/2022    Scheduling Instructions:     Procedure: Lumbar facet block (AKA.: Lumbosacral medial branch nerve block)     Side: Bilateral     Level: L3-4 & L5-S1 Facets (L2, L3, L4, L5, & S1 Medial Branch Nerves)     Sedation: Patient's choice.     Timeframe: Please ask patient when she would like to come in to have it done.    Order Specific Question:   Where will this procedure be performed?    Answer:  [facility]   Follow-up plan:   Return for (Clinic) procedure: (B) L-FCT Blk.     Interventional Therapies  Risk   Complexity Considerations:   Estimated body mass index is 37.89 kg/m as calculated from the following:   Height as of this encounter: 5' (1.524 m).   Weight as of this encounter: 194 lb (88 kg). No RFA until patient brings BMI down to less than 30 kg/m   Planned   Pending:   Pending further evaluation   Under consideration:   Possible bilateral lumbar facet RFA    Completed:   Diagnostic bilateral lumbar facet MBB x2 (01/27/2019) (100/100/100/50)    Therapeutic   Palliative (PRN) options:   Therapeutic/palliative bilateral lumbar facet MBB    Recent Visits No visits were found meeting these conditions. Showing recent visits within past 90 days and meeting all other requirements Today's Visits Date Type Provider Dept  01/10/22 Office Visit Milinda Pointer, MD [facility]  Showing today's visits and meeting all other requirements Future Appointments Date Type Provider Dept  03/29/22 Appointment Milinda Pointer, MD [facility]  Showing future appointments within next 90 days and meeting all other requirements  I discussed the assessment and treatment plan with the patient. The patient was provided an opportunity to ask questions and all were answered. The patient agreed with the plan and demonstrated an understanding of the  instructions.  Patient advised to call back or seek an in-person evaluation if the symptoms or condition worsens.  Duration of encounter: 30 minutes.  Note by: Gaspar Cola, MD Date: 01/10/2022; Time: 1:09 PM

## 2022-01-10 ENCOUNTER — Ambulatory Visit: Payer: Medicare PPO | Attending: Pain Medicine | Admitting: Pain Medicine

## 2022-01-10 ENCOUNTER — Other Ambulatory Visit: Payer: Self-pay

## 2022-01-10 ENCOUNTER — Encounter: Payer: Self-pay | Admitting: *Deleted

## 2022-01-10 ENCOUNTER — Telehealth: Payer: Self-pay

## 2022-01-10 ENCOUNTER — Encounter: Payer: Self-pay | Admitting: Pain Medicine

## 2022-01-10 VITALS — BP 156/80 | HR 86 | Temp 96.6°F | Resp 18 | Ht 60.0 in | Wt 194.0 lb

## 2022-01-10 DIAGNOSIS — M0579 Rheumatoid arthritis with rheumatoid factor of multiple sites without organ or systems involvement: Secondary | ICD-10-CM

## 2022-01-10 DIAGNOSIS — M545 Low back pain, unspecified: Secondary | ICD-10-CM

## 2022-01-10 DIAGNOSIS — M542 Cervicalgia: Secondary | ICD-10-CM

## 2022-01-10 DIAGNOSIS — G8929 Other chronic pain: Secondary | ICD-10-CM

## 2022-01-10 DIAGNOSIS — M25551 Pain in right hip: Secondary | ICD-10-CM

## 2022-01-10 DIAGNOSIS — M47816 Spondylosis without myelopathy or radiculopathy, lumbar region: Secondary | ICD-10-CM

## 2022-01-10 DIAGNOSIS — M5136 Other intervertebral disc degeneration, lumbar region: Secondary | ICD-10-CM

## 2022-01-10 DIAGNOSIS — Z79899 Other long term (current) drug therapy: Secondary | ICD-10-CM

## 2022-01-10 DIAGNOSIS — M51369 Other intervertebral disc degeneration, lumbar region without mention of lumbar back pain or lower extremity pain: Secondary | ICD-10-CM

## 2022-01-10 DIAGNOSIS — G894 Chronic pain syndrome: Secondary | ICD-10-CM

## 2022-01-10 DIAGNOSIS — R0609 Other forms of dyspnea: Secondary | ICD-10-CM | POA: Diagnosis not present

## 2022-01-10 DIAGNOSIS — U099 Post covid-19 condition, unspecified: Secondary | ICD-10-CM

## 2022-01-10 DIAGNOSIS — M7918 Myalgia, other site: Secondary | ICD-10-CM

## 2022-01-10 DIAGNOSIS — Z79891 Long term (current) use of opiate analgesic: Secondary | ICD-10-CM

## 2022-01-10 DIAGNOSIS — M48062 Spinal stenosis, lumbar region with neurogenic claudication: Secondary | ICD-10-CM

## 2022-01-10 DIAGNOSIS — M47812 Spondylosis without myelopathy or radiculopathy, cervical region: Secondary | ICD-10-CM

## 2022-01-10 MED ORDER — HYDROCODONE-ACETAMINOPHEN 10-325 MG PO TABS: 1.0000 | ORAL_TABLET | Freq: Three times a day (TID) | ORAL | 0 refills | Status: DC

## 2022-01-10 NOTE — Progress Notes (Signed)
Daily Session Note  Patient Details  Name: Mariah Poole MRN: [id number] Date of Birth: Feb 09, 1946 Referring Provider:   Flowsheet Row Pulmonary Rehab from 12/18/2021 in [facility]  Referring Provider Dr. Christinia Gully       Encounter Date: 01/10/2022  Check In:  Session Check In - 01/10/22 [place]       Check-In   Supervising physician immediately available to respond to emergencies See telemetry face sheet for immediately available ER MD    Location [facility]    Staff Present Birdie Sons, MPA, Nino Glow, MS, ASCM CEP, Exercise Physiologist;Joseph Tessie Fass, [place]    Virtual Visit No    Medication changes reported     No    Fall or balance concerns reported    No    Warm-up and Cool-down Performed on first and last piece of equipment    Resistance Training Performed Yes    VAD Patient? No    PAD/SET Patient? No      Pain Assessment   Currently in Pain? No/denies                Social History   Tobacco Use  Smoking Status Never  Smokeless Tobacco Never  Tobacco Comments   does not smoke     Goals Met:  Independence with exercise equipment Exercise tolerated well No report of concerns or symptoms today Strength training completed today  Goals Unmet:  Not Applicable  Comments: Pt able to follow exercise prescription today without complaint.  Will continue to monitor for progression.    Dr. Emily Filbert is Medical Director for [facility].  Dr. Ottie Glazier is Medical Director for [facility].

## 2022-01-10 NOTE — Telephone Encounter (Signed)
Called and spoke to patient in regards to upcoming Covid test. Nothing further needed.  °

## 2022-01-10 NOTE — Progress Notes (Signed)
Pulmonary Individual Treatment Plan  Patient Details  Name: Mariah Poole MRN: [id number] Date of Birth: [birth year] Referring Provider:   Flowsheet Row Pulmonary Rehab from 12/18/2021 in [facility] Cardiac and Pulmonary Rehab  Referring Provider Dr. Christinia Gully       Initial Encounter Date:  Flowsheet Row Pulmonary Rehab from 12/18/2021 in [facility] Cardiac and Pulmonary Rehab  Date 12/18/21       Visit Diagnosis: Post-COVID chronic dyspnea  Patient's Home Medications on Admission:  Current Outpatient Medications:    albuterol (PROVENTIL HFA;VENTOLIN HFA) 108 (90 BASE) MCG/ACT inhaler, Inhale 2 puffs into the lungs every 4 (four) hours as needed for wheezing or shortness of breath., Disp: , Rfl:    atenolol (TENORMIN) 25 MG tablet, Take 25 mg by mouth daily. , Disp: , Rfl:    budesonide-formoterol (SYMBICORT) 160-4.5 MCG/ACT inhaler, Inhale into the lungs., Disp: , Rfl:    Cholecalciferol (VITAMIN D3) 2000 UNITS TABS, Take 1 tablet by mouth daily., Disp: , Rfl:    DULoxetine (CYMBALTA) 60 MG capsule, Take 60 mg by mouth daily., Disp: , Rfl:    famotidine (PEPCID) 20 MG tablet, One after supper, Disp: 30 tablet, Rfl: 11   FEROSUL 325 (65 Fe) MG tablet, Take 325 mg by mouth daily., Disp: , Rfl:    furosemide (LASIX) 40 MG tablet, Take 40 mg by mouth 2 (two) times daily., Disp: , Rfl:    HYDROcodone-acetaminophen (NORCO) 10-325 MG tablet, Take 1 tablet by mouth every 8 (eight) hours. Must last 30 days (Patient not taking: Reported on 11/23/2021), Disp: 90 tablet, Rfl: 0   losartan (COZAAR) 25 MG tablet, Take 25 mg by mouth daily., Disp: , Rfl:    pantoprazole (PROTONIX) 40 MG tablet, Take 1 tablet (40 mg total) by mouth daily., Disp: 30 tablet, Rfl: 2   potassium chloride SA (K-DUR,KLOR-CON) 20 MEQ tablet, Take 20 mEq by mouth daily. , Disp: , Rfl:    pravastatin (PRAVACHOL) 20 MG tablet, Take 20 mg by mouth at bedtime. Reported on 12/13/2015, Disp: , Rfl:    spironolactone (ALDACTONE) 25 MG  tablet, Take 12.5 mg by mouth daily., Disp: , Rfl:    SYMBICORT 160-4.5 MCG/ACT inhaler, Take 2 puffs first thing in am and then another 2 puffs about 12 hours later., Disp: 1 each, Rfl: 11   Turmeric Curcumin 500 MG CAPS, Take by mouth daily. , Disp: , Rfl:    vitamin C (ASCORBIC ACID) 500 MG tablet, Take 500 mg by mouth daily., Disp: , Rfl:   Past Medical History: Past Medical History:  Diagnosis Date   Anemia    Arthritis    COPD (chronic obstructive pulmonary disease) ([place])    Depression    Diabetes mellitus without complication (HCC)    GERD (gastroesophageal reflux disease)    Hiatal hernia    History of contrast media allergy. (IVP dye) 09/01/2015   History of hiatal hernia    Hypercholesteremia    Hypertension    Morbid obesity ([place])    Scoliosis    Shortness of breath dyspnea    with exertion    Tobacco Use: Social History   Tobacco Use  Smoking Status Never  Smokeless Tobacco Never  Tobacco Comments   does not smoke     Labs: Recent Review Flowsheet Data     Labs for ITP Cardiac and Pulmonary Rehab Latest Ref Rng & Units 11/23/2009 05/20/2017   Hemoglobin A1c 4.8 - 5.6 % - 6.1(H)   TCO2 0 - 100 mmol/L 29 -  Pulmonary Assessment Scores:  Pulmonary Assessment Scores     Row Name 12/18/21 1601         ADL UCSD   SOB Score total 59     Rest 0     Walk 1     Stairs 3     Bath 1     Dress 2     Shop 3       CAT Score   CAT Score 10       mMRC Score   mMRC Score 2              UCSD: Self-administered rating of dyspnea associated with activities of daily living (ADLs) 6-point scale (0 = "not at all" to 5 = "maximal or unable to do because of breathlessness")  Scoring Scores range from 0 to 120.  Minimally important difference is 5 units  CAT: CAT can identify the health impairment of COPD patients and is better correlated with disease progression.  CAT has a scoring range of zero to 40. The CAT score is classified into four groups of  low (less than 10), medium (10 - 20), high (21-30) and very high (31-40) based on the impact level of disease on health status. A CAT score over 10 suggests significant symptoms.  A worsening CAT score could be explained by an exacerbation, poor medication adherence, poor inhaler technique, or progression of COPD or comorbid conditions.  CAT MCID is 2 points  mMRC: mMRC (Modified Medical Research Council) Dyspnea Scale is used to assess the degree of baseline functional disability in patients of respiratory disease due to dyspnea. No minimal important difference is established. A decrease in score of 1 point or greater is considered a positive change.   Pulmonary Function Assessment:  Pulmonary Function Assessment - 11/23/21 0908       Breath   Shortness of Breath Yes;Limiting activity             Exercise Target Goals: Exercise Program Goal: Individual exercise prescription set using results from initial 6 min walk test and THRR while considering  patients activity barriers and safety.   Exercise Prescription Goal: Initial exercise prescription builds to 30-45 minutes a day of aerobic activity, 2-3 days per week.  Home exercise guidelines will be given to patient during program as part of exercise prescription that the participant will acknowledge.  Education: Aerobic Exercise: - Group verbal and visual presentation on the components of exercise prescription. Introduces F.I.T.T principle from ACSM for exercise prescriptions.  Reviews F.I.T.T. principles of aerobic exercise including progression. Written material given at graduation.   Education: Resistance Exercise: - Group verbal and visual presentation on the components of exercise prescription. Introduces F.I.T.T principle from ACSM for exercise prescriptions  Reviews F.I.T.T. principles of resistance exercise including progression. Written material given at graduation.    Education: Exercise & Equipment Safety: - Individual  verbal instruction and demonstration of equipment use and safety with use of the equipment. Flowsheet Row Pulmonary Rehab from 01/03/2022 in [facility] Cardiac and Pulmonary Rehab  Date 12/18/21  Educator [facility]  Instruction Review Code 1- Verbalizes Understanding       Education: Exercise Physiology & General Exercise Guidelines: - Group verbal and written instruction with models to review the exercise physiology of the cardiovascular system and associated critical values. Provides general exercise guidelines with specific guidelines to those with heart or lung disease.    Education: Flexibility, Balance, Mind/Body Relaxation: - Group verbal and visual presentation with interactive activity on the  components of exercise prescription. Introduces F.I.T.T principle from ACSM for exercise prescriptions. Reviews F.I.T.T. principles of flexibility and balance exercise training including progression. Also discusses the mind body connection.  Reviews various relaxation techniques to help reduce and manage stress (i.e. Deep breathing, progressive muscle relaxation, and visualization). Balance handout provided to take home. Written material given at graduation.   Activity Barriers & Risk Stratification:   6 Minute Walk:  6 Minute Walk     Row Name 12/18/21 1547         6 Minute Walk   Phase Initial     Distance 600 feet     Walk Time 6 minutes     # of Rest Breaks 0     MPH 1.14     METS 0.74     RPE 11     Perceived Dyspnea  1     VO2 Peak 2.58     Symptoms No     Resting HR 82 bpm     Resting BP 104/60     Resting Oxygen Saturation  95 %     Exercise Oxygen Saturation  during 6 min walk 88 %     Max Ex. HR 110 bpm     Max Ex. BP 152/82     2 Minute Post BP 124/80       Interval HR   1 Minute HR 94     2 Minute HR 70     3 Minute HR 61     4 Minute HR 91     5 Minute HR 71     6 Minute HR 110     2 Minute Post HR 89     Interval Heart Rate? Yes       Interval Oxygen   Interval  Oxygen? Yes     Baseline Oxygen Saturation % 95 %     1 Minute Oxygen Saturation % 91 %     1 Minute Liters of Oxygen 0 L     2 Minute Oxygen Saturation % 88 %     2 Minute Liters of Oxygen 0 L     3 Minute Oxygen Saturation % 88 %     3 Minute Liters of Oxygen 0 L     4 Minute Oxygen Saturation % 90 %     4 Minute Liters of Oxygen 0 L     5 Minute Oxygen Saturation % 90 %     5 Minute Liters of Oxygen 0 L     6 Minute Oxygen Saturation % 90 %     6 Minute Liters of Oxygen 0 L     2 Minute Post Oxygen Saturation % 94 %     2 Minute Post Liters of Oxygen 0 L             Oxygen Initial Assessment:  Oxygen Initial Assessment - 12/18/21 1603       Home Oxygen   Home Oxygen Device None    Sleep Oxygen Prescription None    Home Exercise Oxygen Prescription None    Home Resting Oxygen Prescription None    Compliance with Home Oxygen Use Yes      Initial 6 min Walk   Oxygen Used None      Program Oxygen Prescription   Program Oxygen Prescription None      Intervention   Short Term Goals To learn and understand importance of monitoring SPO2 with pulse oximeter and demonstrate accurate use of  the pulse oximeter.;To learn and understand importance of maintaining oxygen saturations>88%;To learn and demonstrate proper pursed lip breathing techniques or other breathing techniques. ;To learn and demonstrate proper use of respiratory medications    Long  Term Goals Verbalizes importance of monitoring SPO2 with pulse oximeter and return demonstration;Maintenance of O2 saturations>88%;Exhibits proper breathing techniques, such as pursed lip breathing or other method taught during program session;Compliance with respiratory medication;Demonstrates proper use of MDIs             Oxygen Re-Evaluation:  Oxygen Re-Evaluation     Row Name 12/20/21 1653             Program Oxygen Prescription   Program Oxygen Prescription None         Home Oxygen   Home Oxygen Device None        Sleep Oxygen Prescription None       Home Exercise Oxygen Prescription None       Home Resting Oxygen Prescription None       Compliance with Home Oxygen Use Yes         Goals/Expected Outcomes   Short Term Goals To learn and understand importance of monitoring SPO2 with pulse oximeter and demonstrate accurate use of the pulse oximeter.;To learn and understand importance of maintaining oxygen saturations>88%;To learn and demonstrate proper pursed lip breathing techniques or other breathing techniques.        Long  Term Goals Verbalizes importance of monitoring SPO2 with pulse oximeter and return demonstration;Maintenance of O2 saturations>88%;Exhibits proper breathing techniques, such as pursed lip breathing or other method taught during program session;Compliance with respiratory medication       Comments Reviewed PLB technique with pt.  Talked about how it works and it's importance in maintaining their exercise saturations.       Goals/Expected Outcomes Short: Become more profiecient at using PLB.   Long: Become independent at using PLB.                Oxygen Discharge (Final Oxygen Re-Evaluation):  Oxygen Re-Evaluation - 12/20/21 1653       Program Oxygen Prescription   Program Oxygen Prescription None      Home Oxygen   Home Oxygen Device None    Sleep Oxygen Prescription None    Home Exercise Oxygen Prescription None    Home Resting Oxygen Prescription None    Compliance with Home Oxygen Use Yes      Goals/Expected Outcomes   Short Term Goals To learn and understand importance of monitoring SPO2 with pulse oximeter and demonstrate accurate use of the pulse oximeter.;To learn and understand importance of maintaining oxygen saturations>88%;To learn and demonstrate proper pursed lip breathing techniques or other breathing techniques.     Long  Term Goals Verbalizes importance of monitoring SPO2 with pulse oximeter and return demonstration;Maintenance of O2 saturations>88%;Exhibits  proper breathing techniques, such as pursed lip breathing or other method taught during program session;Compliance with respiratory medication    Comments Reviewed PLB technique with pt.  Talked about how it works and it's importance in maintaining their exercise saturations.    Goals/Expected Outcomes Short: Become more profiecient at using PLB.   Long: Become independent at using PLB.             Initial Exercise Prescription:  Initial Exercise Prescription - 12/18/21 1500       Date of Initial Exercise RX and Referring Provider   Date 12/18/21    Referring Provider Dr. Christinia Gully  Oxygen   Maintain Oxygen Saturation 88% or higher      Treadmill   MPH 0.8    Grade 0    Minutes 15    METs 1      NuStep   Level 1    SPM 80    Minutes 15    METs 1      Biostep-RELP   Level 1    SPM 50    Minutes 15    METs 1      Track   Laps 6    Minutes 15    METs 1.3      Prescription Details   Frequency (times per week) 3    Duration Progress to 30 minutes of continuous aerobic without signs/symptoms of physical distress      Intensity   THRR 40-80% of Max Heartrate 106-131    Ratings of Perceived Exertion 11-13    Perceived Dyspnea 0-4      Progression   Progression Continue to progress workloads to maintain intensity without signs/symptoms of physical distress.      Resistance Training   Training Prescription Yes    Weight 2    Reps 10-15             Perform Capillary Blood Glucose checks as needed.  Exercise Prescription Changes:   Exercise Prescription Changes     Row Name 12/18/21 1500             Response to Exercise   Blood Pressure (Admit) 104/60       Blood Pressure (Exercise) 152/82       Blood Pressure (Exit) 112/64       Heart Rate (Admit) 82 bpm       Heart Rate (Exercise) 110 bpm       Heart Rate (Exit) 81 bpm       Oxygen Saturation (Admit) 95 %       Oxygen Saturation (Exercise) 88 %       Oxygen Saturation (Exit) 92 %        Rating of Perceived Exertion (Exercise) 11       Perceived Dyspnea (Exercise) 1       Symptoms none       Comments 6 MWT results                Exercise Comments:   Exercise Comments     Row Name 12/20/21 1652           Exercise Comments First full day of exercise!  Patient was oriented to gym and equipment including functions, settings, policies, and procedures.  Patient's individual exercise prescription and treatment plan were reviewed.  All starting workloads were established based on the results of the 6 minute walk test done at initial orientation visit.  The plan for exercise progression was also introduced and progression will be customized based on patient's performance and goals.                Exercise Goals and Review:   Exercise Goals     Row Name 12/18/21 1554             Exercise Goals   Increase Physical Activity Yes       Intervention Provide advice, education, support and counseling about physical activity/exercise needs.;Develop an individualized exercise prescription for aerobic and resistive training based on initial evaluation findings, risk stratification, comorbidities and participant's personal goals.       Expected Outcomes Short Term: Attend  rehab on a regular basis to increase amount of physical activity.;Long Term: Add in home exercise to make exercise part of routine and to increase amount of physical activity.;Long Term: Exercising regularly at least 3-5 days a week.       Increase Strength and Stamina Yes       Intervention Provide advice, education, support and counseling about physical activity/exercise needs.;Develop an individualized exercise prescription for aerobic and resistive training based on initial evaluation findings, risk stratification, comorbidities and participant's personal goals.       Expected Outcomes Short Term: Increase workloads from initial exercise prescription for resistance, speed, and METs.;Short Term:  Perform resistance training exercises routinely during rehab and add in resistance training at home;Long Term: Improve cardiorespiratory fitness, muscular endurance and strength as measured by increased METs and functional capacity (6MWT)       Able to understand and use rate of perceived exertion (RPE) scale Yes       Intervention Provide education and explanation on how to use RPE scale       Expected Outcomes Short Term: Able to use RPE daily in rehab to express subjective intensity level;Long Term:  Able to use RPE to guide intensity level when exercising independently       Able to understand and use Dyspnea scale Yes       Intervention Provide education and explanation on how to use Dyspnea scale       Expected Outcomes Short Term: Able to use Dyspnea scale daily in rehab to express subjective sense of shortness of breath during exertion;Long Term: Able to use Dyspnea scale to guide intensity level when exercising independently       Knowledge and understanding of Target Heart Rate Range (THRR) Yes       Intervention Provide education and explanation of THRR including how the numbers were predicted and where they are located for reference       Expected Outcomes Short Term: Able to state/look up THRR;Long Term: Able to use THRR to govern intensity when exercising independently;Short Term: Able to use daily as guideline for intensity in rehab       Able to check pulse independently Yes       Intervention Provide education and demonstration on how to check pulse in carotid and radial arteries.;Review the importance of being able to check your own pulse for safety during independent exercise       Expected Outcomes Short Term: Able to explain why pulse checking is important during independent exercise;Long Term: Able to check pulse independently and accurately       Understanding of Exercise Prescription Yes       Intervention Provide education, explanation, and written materials on patient's  individual exercise prescription       Expected Outcomes Short Term: Able to explain program exercise prescription;Long Term: Able to explain home exercise prescription to exercise independently                Exercise Goals Re-Evaluation :  Exercise Goals Re-Evaluation     Row Name 12/20/21 1652             Exercise Goal Re-Evaluation   Exercise Goals Review Increase Physical Activity;Able to understand and use rate of perceived exertion (RPE) scale;Knowledge and understanding of Target Heart Rate Range (THRR);Understanding of Exercise Prescription;Increase Strength and Stamina;Able to understand and use Dyspnea scale;Able to check pulse independently       Comments Reviewed RPE and dyspnea scales, THR and program prescription with  pt today.  Pt voiced understanding and was given a copy of goals to take home.       Expected Outcomes Short: Use RPE daily to regulate intensity. Long: Follow program prescription in THR.                Discharge Exercise Prescription (Final Exercise Prescription Changes):  Exercise Prescription Changes - 12/18/21 1500       Response to Exercise   Blood Pressure (Admit) 104/60    Blood Pressure (Exercise) 152/82    Blood Pressure (Exit) 112/64    Heart Rate (Admit) 82 bpm    Heart Rate (Exercise) 110 bpm    Heart Rate (Exit) 81 bpm    Oxygen Saturation (Admit) 95 %    Oxygen Saturation (Exercise) 88 %    Oxygen Saturation (Exit) 92 %    Rating of Perceived Exertion (Exercise) 11    Perceived Dyspnea (Exercise) 1    Symptoms none    Comments 6 MWT results             Nutrition:  Target Goals: Understanding of nutrition guidelines, daily intake of sodium 1500mg , cholesterol 200mg , calories 30% from fat and 7% or less from saturated fats, daily to have 5 or more servings of fruits and vegetables.  Education: All About Nutrition: -Group instruction provided by verbal, written material, interactive activities, discussions, models,  and posters to present general guidelines for heart healthy nutrition including fat, fiber, MyPlate, the role of sodium in heart healthy nutrition, utilization of the nutrition label, and utilization of this knowledge for meal planning. Follow up email sent as well. Written material given at graduation.   Biometrics:  Pre Biometrics - 12/18/21 1555       Pre Biometrics   Height 4\' 9"  (1.448 m)    Weight 200 lb 11.2 oz (91 kg)    BMI (Calculated) 43.42    Single Leg Stand 0 seconds              Nutrition Therapy Plan and Nutrition Goals:  Nutrition Therapy & Goals - 12/18/21 1557       Intervention Plan   Intervention Prescribe, educate and counsel regarding individualized specific dietary modifications aiming towards targeted core components such as weight, hypertension, lipid management, diabetes, heart failure and other comorbidities.    Expected Outcomes Short Term Goal: Understand basic principles of dietary content, such as calories, fat, sodium, cholesterol and nutrients.;Long Term Goal: Adherence to prescribed nutrition plan.;Short Term Goal: A plan has been developed with personal nutrition goals set during dietitian appointment.             Nutrition Assessments:  MEDIFICTS Score Key: ?70 Need to make dietary changes  40-70 Heart Healthy Diet ? 40 Therapeutic Level Cholesterol Diet  Flowsheet Row Pulmonary Rehab from 12/18/2021 in [facility] Cardiac and Pulmonary Rehab  Picture Your Plate Total Score on Admission 62      Picture Your Plate Scores: [id number] Unhealthy dietary pattern with much room for improvement. 41-50 Dietary pattern unlikely to meet recommendations for good health and room for improvement. 51-60 More healthful dietary pattern, with some room for improvement.  >60 Healthy dietary pattern, although there may be some specific behaviors that could be improved.   Nutrition Goals Re-Evaluation:   Nutrition Goals Discharge (Final Nutrition Goals  Re-Evaluation):   Psychosocial: Target Goals: Acknowledge presence or absence of significant depression and/or stress, maximize coping skills, provide positive support system. Participant is able to verbalize types and ability to use  techniques and skills needed for reducing stress and depression.   Education: Stress, Anxiety, and Depression - Group verbal and visual presentation to define topics covered.  Reviews how body is impacted by stress, anxiety, and depression.  Also discusses healthy ways to reduce stress and to treat/manage anxiety and depression.  Written material given at graduation. Flowsheet Row Pulmonary Rehab from 01/03/2022 in [facility] Cardiac and Pulmonary Rehab  Date 01/03/22  Educator AS  Instruction Review Code 1- Verbalizes Understanding       Education: Sleep Hygiene -Provides group verbal and written instruction about how sleep can affect your health.  Define sleep hygiene, discuss sleep cycles and impact of sleep habits. Review good sleep hygiene tips.    Initial Review & Psychosocial Screening:  Initial Psych Review & Screening - 11/23/21 0909       Initial Review   Current issues with Current Psychotropic Meds      Family Dynamics   Good Support System? Yes    Comments She can look to her daughter who lives with her, sister, son and brother who lives in town. She has 4 grand children. 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      Barriers   Psychosocial barriers to participate in program The patient should benefit from training in stress management and relaxation.      Screening Interventions   Interventions Encouraged to exercise;Program counselor consult;To provide support and resources with identified psychosocial needs;Provide feedback about the scores to participant    Expected Outcomes Short Term goal: Utilizing psychosocial counselor, staff and physician to assist with identification of specific Stressors or current issues interfering with healing  process. Setting desired goal for each stressor or current issue identified.;Long Term Goal: Stressors or current issues are controlled or eliminated.;Short Term goal: Identification and review with participant of any Quality of Life or Depression concerns found by scoring the questionnaire.;Long Term goal: The participant improves quality of Life and PHQ9 Scores as seen by post scores and/or verbalization of changes             Quality of Life Scores:  Scores of 19 and below usually indicate a poorer quality of life in these areas.  A difference of  2-3 points is a clinically meaningful difference.  A difference of 2-3 points in the total score of the Quality of Life Index has been associated with significant improvement in overall quality of life, self-image, physical symptoms, and general health in studies assessing change in quality of life.  PHQ-9: Recent Review Flowsheet Data     Depression screen [facility] 2/9 12/18/2021 07/18/2021 01/14/2019 12/25/2018 11/27/2018   Decreased Interest 0 0 0 0 0   Down, Depressed, Hopeless 0 0 0 0 0   PHQ - 2 Score 0 0 0 0 0   Altered sleeping 0 - - - -   Tired, decreased energy 3 - - - -   Change in appetite 0 - - - -   Feeling bad or failure about yourself  1 - - - -   Trouble concentrating 1 - - - -   Moving slowly or fidgety/restless 0 - - - -   Suicidal thoughts 0 - - - -   PHQ-9 Score 5 - - - -   Difficult doing work/chores Somewhat difficult - - - -      Interpretation of Total Score  Total Score Depression Severity:  1-4 = Minimal depression, 5-9 = Mild depression, 10-14 = Moderate depression, 15-19 = Moderately  severe depression, 20-27 = Severe depression   Psychosocial Evaluation and Intervention:  Psychosocial Evaluation - 11/23/21 0913       Psychosocial Evaluation & Interventions   Interventions Encouraged to exercise with the program and follow exercise prescription;Relaxation education;Stress management education    Comments She can  look to her daughter who lives with her, sister, son and brother who lives in town. She has 4 grand children. 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    Expected Outcomes Short: Start [facility] to help with mood. Long: Maintain a healthy mental state.    Continue Psychosocial Services  Follow up required by staff             Psychosocial Re-Evaluation:   Psychosocial Discharge (Final Psychosocial Re-Evaluation):   Education: Education Goals: Education classes will be provided on a weekly basis, covering required topics. Participant will state understanding/return demonstration of topics presented.  Learning Barriers/Preferences:  Learning Barriers/Preferences - 11/23/21 0908       Learning Barriers/Preferences   Learning Barriers None    Learning Preferences None             General Pulmonary Education Topics:  Infection Prevention: - Provides verbal and written material to individual with discussion of infection control including proper hand washing and proper equipment cleaning during exercise session. Flowsheet Row Pulmonary Rehab from 01/03/2022 in [facility]  Date 12/18/21  Educator [facility]  Instruction Review Code 1- Verbalizes Understanding       Falls Prevention: - Provides verbal and written material to individual with discussion of falls prevention and safety. Flowsheet Row Pulmonary Rehab from 01/03/2022 in [facility]  Date 12/18/21  Educator [facility]  Instruction Review Code 1- Verbalizes Understanding       Chronic Lung Disease Review: - Group verbal instruction with posters, models, PowerPoint presentations and videos,  to review new updates, new respiratory medications, new advancements in procedures and treatments. Providing information on websites and "800" numbers for continued self-education. Includes information about supplement oxygen, available portable oxygen systems, continuous and intermittent flow  rates, oxygen safety, concentrators, and Medicare reimbursement for oxygen. Explanation of Pulmonary Drugs, including class, frequency, complications, importance of spacers, rinsing mouth after steroid MDI's, and proper cleaning methods for nebulizers. Review of basic lung anatomy and physiology related to function, structure, and complications of lung disease. Review of risk factors. Discussion about methods for diagnosing sleep apnea and types of masks and machines for OSA. Includes a review of the use of types of environmental controls: home humidity, furnaces, filters, dust mite/pet prevention, HEPA vacuums. Discussion about weather changes, air quality and the benefits of nasal washing. Instruction on Warning signs, infection symptoms, calling MD promptly, preventive modes, and value of vaccinations. Review of effective airway clearance, coughing and/or vibration techniques. Emphasizing that all should Create an Action Plan. Written material given at graduation. Flowsheet Row Pulmonary Rehab from 01/03/2022 in [facility] Pulmonary Rehab  Education need identified 12/18/21       AED/CPR: - Group verbal and written instruction with the use of models to demonstrate the basic use of the AED with the basic ABC's of resuscitation.    Anatomy and Cardiac Procedures: - Group verbal and visual presentation and models provide information about basic cardiac anatomy and function. Reviews the testing methods done to diagnose heart disease and the outcomes of the test results. Describes the treatment choices: Medical Management, Angioplasty, or Coronary Bypass Surgery for treating various heart conditions including Myocardial Infarction,  Angina, Valve Disease, and Cardiac Arrhythmias.  Written material given at graduation.   Medication Safety: - Group verbal and visual instruction to review commonly prescribed medications for heart and lung disease. Reviews the medication, class of the drug, and side  effects. Includes the steps to properly store meds and maintain the prescription regimen.  Written material given at graduation.   Other: -Provides group and verbal instruction on various topics (see comments)   Knowledge Questionnaire Score:  Knowledge Questionnaire Score - 12/18/21 1558       Knowledge Questionnaire Score   Pre Score 15/18              Core Components/Risk Factors/Patient Goals at Admission:  Personal Goals and Risk Factors at Admission - 12/18/21 1556       Core Components/Risk Factors/Patient Goals on Admission    Weight Management Yes;Weight Loss    Intervention Weight Management: Develop a combined nutrition and exercise program designed to reach desired caloric intake, while maintaining appropriate intake of nutrient and fiber, sodium and fats, and appropriate energy expenditure required for the weight goal.;Weight Management: Provide education and appropriate resources to help participant work on and attain dietary goals.;Weight Management/Obesity: Establish reasonable short term and long term weight goals.;Obesity: Provide education and appropriate resources to help participant work on and attain dietary goals.    Admit Weight 200 lb 11.2 oz (91 kg)    Goal Weight: Short Term 195 lb (88.5 kg)    Goal Weight: Long Term 190 lb (86.2 kg)    Expected Outcomes Short Term: Continue to assess and modify interventions until short term weight is achieved;Long Term: Adherence to nutrition and physical activity/exercise program aimed toward attainment of established weight goal;Understanding recommendations for meals to include 15-35% energy as protein, 25-35% energy from fat, 35-60% energy from carbohydrates, less than 200mg  of dietary cholesterol, 20-35 gm of total fiber daily;Understanding of distribution of calorie intake throughout the day with the consumption of 4-5 meals/snacks;Weight Loss: Understanding of general recommendations for a balanced deficit meal plan,  which promotes 1-2 lb weight loss per week and includes a negative energy balance of [phone] kcal/d    Improve shortness of breath with ADL's Yes    Intervention Provide education, individualized exercise plan and daily activity instruction to help decrease symptoms of SOB with activities of daily living.    Expected Outcomes Short Term: Improve cardiorespiratory fitness to achieve a reduction of symptoms when performing ADLs;Long Term: Be able to perform more ADLs without symptoms or delay the onset of symptoms    Heart Failure Yes    Intervention Provide a combined exercise and nutrition program that is supplemented with education, support and counseling about heart failure. Directed toward relieving symptoms such as shortness of breath, decreased exercise tolerance, and extremity edema.    Expected Outcomes Improve functional capacity of life;Short term: Attendance in program 2-3 days a week with increased exercise capacity. Reported lower sodium intake. Reported increased fruit and vegetable intake. Reports medication compliance.;Short term: Daily weights obtained and reported for increase. Utilizing diuretic protocols set by physician.;Long term: Adoption of self-care skills and reduction of barriers for early signs and symptoms recognition and intervention leading to self-care maintenance.    Hypertension Yes    Intervention Provide education on lifestyle modifcations including regular physical activity/exercise, weight management, moderate sodium restriction and increased consumption of fresh fruit, vegetables, and low fat dairy, alcohol moderation, and smoking cessation.;Monitor prescription use compliance.    Expected Outcomes Short Term: Continued assessment and intervention  until BP is < 140/66mm HG in hypertensive participants. < 130/7mm HG in hypertensive participants with diabetes, heart failure or chronic kidney disease.;Long Term: Maintenance of blood pressure at goal levels.    Lipids  Yes    Intervention Provide education and support for participant on nutrition & aerobic/resistive exercise along with prescribed medications to achieve LDL 70mg , HDL >40mg .    Expected Outcomes Short Term: Participant states understanding of desired cholesterol values and is compliant with medications prescribed. Participant is following exercise prescription and nutrition guidelines.;Long Term: Cholesterol controlled with medications as prescribed, with individualized exercise RX and with personalized nutrition plan. Value goals: LDL < 70mg , HDL > 40 mg.             Education:Diabetes - Individual verbal and written instruction to review signs/symptoms of diabetes, desired ranges of glucose level fasting, after meals and with exercise. Acknowledge that pre and post exercise glucose checks will be done for 3 sessions at entry of program.   Know Your Numbers and Heart Failure: - Group verbal and visual instruction to discuss disease risk factors for cardiac and pulmonary disease and treatment options.  Reviews associated critical values for Overweight/Obesity, Hypertension, Cholesterol, and Diabetes.  Discusses basics of heart failure: signs/symptoms and treatments.  Introduces Heart Failure Zone chart for action plan for heart failure.  Written material given at graduation.   Core Components/Risk Factors/Patient Goals Review:    Core Components/Risk Factors/Patient Goals at Discharge (Final Review):    ITP Comments:  ITP Comments     Row Name 11/23/21 0906 12/18/21 1545 12/20/21 1652 01/10/22 0922     ITP Comments Virtual Visit completed. Patient informed on EP and RD appointment and 6 Minute walk test. Patient also informed of patient health questionnaires on My Chart. Patient Verbalizes understanding. Visit diagnosis can be found in [facility] 10/31/2021. Completed 6MWT and gym orientation. Initial ITP created and sent for review to Dr. Zetta Bills, Medical Director. First full day of  exercise!  Patient was oriented to gym and equipment including functions, settings, policies, and procedures.  Patient's individual exercise prescription and treatment plan were reviewed.  All starting workloads were established based on the results of the 6 minute walk test done at initial orientation visit.  The plan for exercise progression was also introduced and progression will be customized based on patient's performance and goals. 30 Day review completed. Medical Director ITP review done, changes made as directed, and signed approval by Medical Director.             Comments:

## 2022-01-10 NOTE — Progress Notes (Signed)
Nursing Pain Medication Assessment:  Safety precautions to be maintained throughout the outpatient stay will include: orient to surroundings, keep bed in low position, maintain call bell within reach at all times, provide assistance with transfer out of bed and ambulation.  Medication Inspection Compliance: Pill count conducted under aseptic conditions, in front of the patient. Neither the pills nor the bottle was removed from the patient's sight at any time. Once count was completed pills were immediately returned to the patient in their original bottle.  Medication: Hydrocodone/APAP Pill/Patch Count:  8 of 90 pills remain Pill/Patch Appearance: Markings consistent with prescribed medication Bottle Appearance: Standard pharmacy container. Clearly labeled. Filled Date: 01 / 27 / 2023 Last Medication intake:  Today

## 2022-01-10 NOTE — Patient Instructions (Addendum)
____________________________________________________________________________________________ ° °Medication Rules ° °Purpose: To inform patients, and their family members, of our rules and regulations. ° °Applies to: All patients receiving prescriptions (written or electronic). ° °Pharmacy of record: Pharmacy where electronic prescriptions will be sent. If written prescriptions are taken to a different pharmacy, please inform the nursing staff. The pharmacy listed in the electronic medical record should be the one where you would like electronic prescriptions to be sent. ° °Electronic prescriptions: In compliance with the [place] [employer]rengthen Opioid Misuse Prevention (STOP) Act of 2017 (Session Law 2017-74/H243), effective November 19, 2018, all controlled substances must be electronically prescribed. Calling prescriptions to the pharmacy will cease to exist. ° °Prescription refills: Only during scheduled appointments. Applies to all prescriptions. ° °NOTE: The following applies primarily to controlled substances (Opioid* Pain Medications).  ° °Type of encounter (visit): For patients receiving controlled substances, face-to-face visits are required. (Not an option or up to the patient.) ° °Patient's responsibilities: °Pain Pills: Bring all pain pills to every appointment (except for procedure appointments). °Pill Bottles: Bring pills in original pharmacy bottle. Always bring the newest bottle. Bring bottle, even if empty. °Medication refills: You are responsible for knowing and keeping track of what medications you take and those you need refilled. °The day before your appointment: write a list of all prescriptions that need to be refilled. °The day of the appointment: give the list to the admitting nurse. Prescriptions will be written only during appointments. No prescriptions will be written on procedure days. °If you forget a medication: it will not be "Called in", "Faxed", or "electronically sent". You will  need to get another appointment to get these prescribed. °No early refills. Do not call asking to have your prescription filled early. °Prescription Accuracy: You are responsible for carefully inspecting your prescriptions before leaving our office. Have the discharge nurse carefully go over each prescription with you, before taking them home. Make sure that your name is accurately spelled, that your address is correct. Check the name and dose of your medication to make sure it is accurate. Check the number of pills, and the written instructions to make sure they are clear and accurate. Make sure that you are given enough medication to last until your next medication refill appointment. °Taking Medication: Take medication as prescribed. When it comes to controlled substances, taking less pills or less frequently than prescribed is permitted and encouraged. °Never take more pills than instructed. °Never take medication more frequently than prescribed.  °Inform other Doctors: Always inform, all of your healthcare providers, of all the medications you take. °Pain Medication from other Providers: You are not allowed to accept any additional pain medication from any other Doctor or Healthcare provider. There are two exceptions to this rule. (see below) In the event that you require additional pain medication, you are responsible for notifying us, as stated below. °Cough Medicine: Often these contain an opioid, such as codeine or hydrocodone. Never accept or take cough medicine containing these opioids if you are already taking an opioid* medication. The combination may cause respiratory failure and death. °Medication Agreement: You are responsible for carefully reading and following our Medication Agreement. This must be signed before receiving any prescriptions from our practice. Safely store a copy of your signed Agreement. Violations to the Agreement will result in no further prescriptions. (Additional copies of our  Medication Agreement are available upon request.) °Laws, Rules, & Regulations: All patients are expected to follow all Federal and State Laws, Statutes, Rules, & Regulations. Ignorance of   the Laws does not constitute a valid excuse.  °Illegal drugs and Controlled Substances: The use of illegal substances (including, but not limited to marijuana and its derivatives) and/or the illegal use of any controlled substances is strictly prohibited. Violation of this rule may result in the immediate and permanent discontinuation of any and all prescriptions being written by our practice. The use of any illegal substances is prohibited. °Adopted CDC guidelines & recommendations: Target dosing levels will be at or below 60 MME/day. Use of benzodiazepines** is not recommended. ° °Exceptions: There are only two exceptions to the rule of not receiving pain medications from other Healthcare Providers. °Exception #1 (Emergencies): In the event of an emergency (i.e.: accident requiring emergency care), you are allowed to receive additional pain medication. However, you are responsible for: As soon as you are able, call our office [phone], at any time of the day or night, and leave a message stating your name, the date and nature of the emergency, and the name and dose of the medication prescribed. In the event that your call is answered by a member of our staff, make sure to document and save the date, time, and the name of the person that took your information.  °Exception #2 (Planned Surgery): In the event that you are scheduled by another doctor or dentist to have any type of surgery or procedure, you are allowed (for a period no longer than 30 days), to receive additional pain medication, for the acute post-op pain. However, in this case, you are responsible for picking up a copy of our "Post-op Pain Management for Surgeons" handout, and giving it to your surgeon or dentist. This document is available at our office, and  does not require an appointment to obtain it. Simply go to our office during business hours (Monday-Thursday from 8:00 AM to 4:00 PM) (Friday 8:00 AM to 12:00 Noon) or if you have a scheduled appointment with us, prior to your surgery, and ask for it by name. In addition, you are responsible for: calling our office [phone], at any time of the day or night, and leaving a message stating your name, name of your surgeon, type of surgery, and date of procedure or surgery. Failure to comply with your responsibilities may result in termination of therapy involving the controlled substances. °Medication Agreement Violation. Following the above rules, including your responsibilities will help you in avoiding a Medication Agreement Violation (“Breaking your Pain Medication Contract”). ° °*Opioid medications include: morphine, codeine, oxycodone, oxymorphone, hydrocodone, hydromorphone, meperidine, tramadol, tapentadol, buprenorphine, fentanyl, methadone. °**Benzodiazepine medications include: diazepam (Valium), alprazolam (Xanax), clonazepam (Klonopine), lorazepam (Ativan), clorazepate (Tranxene), chlordiazepoxide (Librium), estazolam (Prosom), oxazepam (Serax), temazepam (Restoril), triazolam (Halcion) °(Last updated: 08/16/2021) °____________________________________________________________________________________________ ° ____________________________________________________________________________________________ ° °Medication Recommendations and Reminders ° °Applies to: All patients receiving prescriptions (written and/or electronic). ° °Medication Rules & Regulations: These rules and regulations exist for your safety and that of others. They are not flexible and neither are we. Dismissing or ignoring them will be considered "non-compliance" with medication therapy, resulting in complete and irreversible termination of such therapy. (See document titled "Medication Rules" for more details.) In all conscience,  because of safety reasons, we cannot continue providing a therapy where the patient does not follow instructions. ° °Pharmacy of record:  °Definition: This is the pharmacy where your electronic prescriptions will be sent.  °We do not endorse any particular pharmacy, however, we have experienced problems with [facility] not securing enough medication supply for the community. °We do not restrict you   in your choice of pharmacy. However, once we write for your prescriptions, we will NOT be re-sending more prescriptions to fix restricted supply problems created by your pharmacy, or your insurance.  °The pharmacy listed in the electronic medical record should be the one where you want electronic prescriptions to be sent. °If you choose to change pharmacy, simply notify our nursing staff. ° °Recommendations: °Keep all of your pain medications in a safe place, under lock and key, even if you live alone. We will NOT replace lost, stolen, or damaged medication. °After you fill your prescription, take 1 week's worth of pills and put them away in a safe place. You should keep a separate, properly labeled bottle for this purpose. The remainder should be kept in the original bottle. Use this as your primary supply, until it runs out. Once it's gone, then you know that you have 1 week's worth of medicine, and it is time to come in for a prescription refill. If you do this correctly, it is unlikely that you will ever run out of medicine. °To make sure that the above recommendation works, it is very important that you make sure your medication refill appointments are scheduled at least 1 week before you run out of medicine. To do this in an effective manner, make sure that you do not leave the office without scheduling your next medication management appointment. Always ask the nursing staff to show you in your prescription , when your medication will be running out. Then arrange for the receptionist to get you a return appointment,  at least 7 days before you run out of medicine. Do not wait until you have 1 or 2 pills left, to come in. This is very poor planning and does not take into consideration that we may need to cancel appointments due to bad weather, sickness, or emergencies affecting our staff. °DO NOT ACCEPT A "Partial Fill": If for any reason your pharmacy does not have enough pills/tablets to completely fill or refill your prescription, do not allow for a "partial fill". The law allows the pharmacy to complete that prescription within 72 hours, without requiring a new prescription. If they do not fill the rest of your prescription within those 72 hours, you will need a separate prescription to fill the remaining amount, which we will NOT provide. If the reason for the partial fill is your insurance, you will need to talk to the pharmacist about payment alternatives for the remaining tablets, but again, DO NOT ACCEPT A PARTIAL FILL, unless you can trust your pharmacist to obtain the remainder of the pills within 72 hours. ° °Prescription refills and/or changes in medication(s):  °Prescription refills, and/or changes in dose or medication, will be conducted only during scheduled medication management appointments. (Applies to both, written and electronic prescriptions.) °No refills on procedure days. No medication will be changed or started on procedure days. No changes, adjustments, and/or refills will be conducted on a procedure day. Doing so will interfere with the diagnostic portion of the procedure. °No phone refills. No medications will be "called into the pharmacy". °No Fax refills. °No weekend refills. °No Holliday refills. °No after hours refills. ° °Remember:  °Business hours are:  °Monday to Thursday 8:00 AM to 4:00 PM °Provider's Schedule: °Francisco Naveira, MD - Appointments are:  °Medication management: Monday and Wednesday 8:00 AM to 4:00 PM °Procedure day: Tuesday and Thursday 7:30 AM to 4:00 PM °Bilal Lateef, MD -  Appointments are:  °Medication management: Tuesday and Thursday 8:00   AM to 4:00 PM °Procedure day: Monday and Wednesday 7:30 AM to 4:00 PM °(Last update: 06/08/2020) °____________________________________________________________________________________________ ° ____________________________________________________________________________________________ ° °CBD (cannabidiol) & Delta-8 (Delta-8 tetrahydrocannabinol) WARNING ° °Intro: Cannabidiol (CBD) and tetrahydrocannabinol (THC), are two natural compounds found in plants of the Cannabis genus. They can both be extracted from hemp or cannabis. Hemp and cannabis come from the Cannabis sativa plant. Both compounds interact with your body’s endocannabinoid system, but they have very different effects. CBD does not produce the high sensation associated with cannabis. Delta-8 tetrahydrocannabinol, also known as delta-8 THC, is a psychoactive substance found in the Cannabis sativa plant, of which marijuana and hemp are two varieties. THC is responsible for the high associated with the illicit use of marijuana. ° °Applicable to: All individuals currently taking or considering taking CBD (cannabidiol) and, more important, all patients taking opioid analgesic controlled substances (pain medication). (Example: oxycodone; oxymorphone; hydrocodone; hydromorphone; morphine; methadone; tramadol; tapentadol; fentanyl; buprenorphine; butorphanol; dextromethorphan; meperidine; codeine; etc.) ° °Legal status: CBD remains a Schedule I drug prohibited for any use. CBD is illegal with one exception. In the [place], CBD has a limited Food and Drug Administration (FDA) approval for the treatment of two specific types of epilepsy disorders. Only one CBD product has been approved by the FDA for this purpose: "Epidiolex". FDA is aware that some companies are marketing products containing cannabis and cannabis-derived compounds in ways that violate the Federal Food, Drug and Cosmetic Act  (FD&C Act) and that may put the health and safety of consumers at risk. The FDA, a Federal agency, has not enforced the CBD status since 2018.  ° °Legality: Some manufacturers ship CBD products nationally, which is illegal. Often such products are sold online and are therefore available throughout the country. CBD is openly sold in head shops and health food stores in some states where such sales have not been explicitly legalized. Selling unapproved products with unsubstantiated therapeutic claims is not only a violation of the law, but also can put patients at risk, as these products have not been proven to be safe or effective. Federal illegality makes it difficult to conduct research on CBD. ° °Reference: "FDA Regulation of Cannabis and Cannabis-Derived Products, Including Cannabidiol (CBD)" - https://www.fda.gov/news-events/public-health-focus/fda-regulation-cannabis-and-cannabis-derived-products-including-cannabidiol-cbd ° °Warning: CBD is not FDA approved and has not undergo the same manufacturing controls as prescription drugs.  This means that the purity and safety of available CBD may be questionable. Most of the time, despite manufacturer's claims, it is contaminated with THC (delta-9-tetrahydrocannabinol - the chemical in marijuana responsible for the "HIGH").  When this is the case, the THC contaminant will trigger a positive urine drug screen (UDS) test for Marijuana (carboxy-THC). Because a positive UDS for any illicit substance is a violation of our medication agreement, your opioid analgesics (pain medicine) may be permanently discontinued. °The FDA recently put out a warning about 5 things that everyone should be aware of regarding Delta-8 THC: °Delta-8 THC products have not been evaluated or approved by the FDA for safe use and may be marketed in ways that put the public health at risk. °The FDA has received adverse event reports involving delta-8 THC-containing products. °Delta-8 THC has  psychoactive and intoxicating effects. °Delta-8 THC manufacturing often involve use of potentially harmful chemicals to create the concentrations of delta-8 THC claimed in the marketplace. The final delta-8 THC product may have potentially harmful by-products (contaminants) due to the chemicals used in the process. Manufacturing of delta-8 THC products may occur in uncontrolled or unsanitary settings, which may   lead to the presence of unsafe contaminants or other potentially harmful substances. °Delta-8 THC products should be kept out of the reach of children and pets. ° °MORE ABOUT CBD ° °General Information: CBD was discovered in 1940 and it is a derivative of the cannabis sativa genus plants (Marijuana and Hemp). It is one of the 113 identified substances found in Marijuana. It accounts for up to 40% of the plant's extract. As of 2018, preliminary clinical studies on CBD included research for the treatment of anxiety, movement disorders, and pain. CBD is available and consumed in multiple forms, including inhalation of smoke or vapor, as an aerosol spray, and by mouth. It may be supplied as an oil containing CBD, capsules, dried cannabis, or as a liquid solution. CBD is thought not to be as psychoactive as THC (delta-9-tetrahydrocannabinol - the chemical in marijuana responsible for the "HIGH"). Studies suggest that CBD may interact with different biological target receptors in the body, including cannabinoid and other neurotransmitter receptors. As of 2018 the mechanism of action for its biological effects has not been determined. ° °Side-effects   Adverse reactions: Dry mouth, diarrhea, decreased appetite, fatigue, drowsiness, malaise, weakness, sleep disturbances, and others. ° °Drug interactions: CBC may interact with other medications such as blood-thinners. °(Last update: 08/18/2021) °____________________________________________________________________________________________ °  ____________________________________________________________________________________________ ° °Drug Holidays (Slow) ° °What is a "Drug Holiday"? °Drug Holiday: is the name given to the period of time during which a patient stops taking a medication(s) for the purpose of eliminating tolerance to the drug. ° °Benefits °Improved effectiveness of opioids. °Decreased opioid dose needed to achieve benefits. °Improved pain with lesser dose. ° °What is tolerance? °Tolerance: is the progressive decreased in effectiveness of a drug due to its repetitive use. With repetitive use, the body gets use to the medication and as a consequence, it loses its effectiveness. This is a common problem seen with opioid pain medications. As a result, a larger dose of the drug is needed to achieve the same effect that used to be obtained with a smaller dose. ° °How long should a "Drug Holiday" last? °You should stay off of the pain medicine for at least 14 consecutive days. (2 weeks) ° °Should I stop the medicine "cold turkey"? °No. You should always coordinate with your Pain Specialist so that he/she can provide you with the correct medication dose to make the transition as smoothly as possible. ° °How do I stop the medicine? °Slowly. You will be instructed to decrease the daily amount of pills that you take by one (1) pill every seven (7) days. This is called a "slow downward taper" of your dose. For example: if you normally take four (4) pills per day, you will be asked to drop this dose to three (3) pills per day for seven (7) days, then to two (2) pills per day for seven (7) days, then to one (1) per day for seven (7) days, and at the end of those last seven (7) days, this is when the "Drug Holiday" would start.  ° °Will I have withdrawals? °By doing a "slow downward taper" like this one, it is unlikely that you will experience any significant withdrawal symptoms. Typically, what triggers withdrawals is the sudden stop of a high dose  opioid therapy. Withdrawals can usually be avoided by slowly decreasing the dose over a prolonged period of time. If you do not follow these instructions and decide to stop your medication abruptly, withdrawals may be possible. ° °What are withdrawals? °Withdrawals: refers   to the wide range of symptoms that occur after stopping or dramatically reducing opiate drugs after heavy and prolonged use. Withdrawal symptoms do not occur to patients that use low dose opioids, or those who take the medication sporadically. Contrary to benzodiazepine (example: Valium, Xanax, etc.) or alcohol withdrawals (Delirium Tremens), opioid withdrawals are not lethal. Withdrawals are the physical manifestation of the body getting rid of the excess receptors.  Expected Symptoms Early symptoms of withdrawal may include: Agitation Anxiety Muscle aches Increased tearing Insomnia Runny nose Sweating Yawning  Late symptoms of withdrawal may include: Abdominal cramping Diarrhea Dilated pupils Goose bumps Nausea Vomiting  Will I experience withdrawals? Due to the slow nature of the taper, it is very unlikely that you will experience any.  What is a slow taper? Taper: refers to the gradual decrease in dose.  (Last update: 06/08/2020) ____________________________________________________________________________________________   ______________________________________________________________________  Preparing for Procedure with Sedation  NOTICE: Due to recent regulatory changes, starting on June 19, 2021, procedures requiring intravenous (IV) sedation will no longer be performed at the [facility].  These types of procedures are required to be performed at [facility] amb[facility] surgery facility.  We are very sorry for the inconvenience.  Procedure appointments are limited to planned procedures: No Prescription Refills. No disability issues will be discussed. No medication changes will be  discussed.  Instructions: Oral Intake: Do not eat or drink anything for at least 8 hours prior to your procedure. (Exception: Blood Pressure Medication. See below.) Transportation: A driver is required. You may not drive yourself after the procedure. Blood Pressure Medicine: Do not forget to take your blood pressure medicine with a sip of water the morning of the procedure. If your Diastolic (lower reading) is above 100 mmHg, elective cases will be cancelled/rescheduled. Blood thinners: These will need to be stopped for procedures. Notify our staff if you are taking any blood thinners. Depending on which one you take, there will be specific instructions on how and when to stop it. Diabetics on insulin: Notify the staff so that you can be scheduled 1st case in the morning. If your diabetes requires high dose insulin, take only  of your normal insulin dose the morning of the procedure and notify the staff that you have done so. Preventing infections: Shower with an antibacterial soap the morning of your procedure. Build-up your immune system: Take 1000 mg of Vitamin C with every meal (3 times a day) the day prior to your procedure. Antibiotics: Inform the staff if you have a condition or reason that requires you to take antibiotics before dental procedures. Pregnancy: If you are pregnant, call and cancel the procedure. Sickness: If you have a cold, fever, or any active infections, call and cancel the procedure. Arrival: You must be in the facility at least 30 minutes prior to your scheduled procedure. Children: Do not bring children with you. Dress appropriately: Bring dark clothing that you would not mind if they get stained. Valuables: Do not bring any jewelry or valuables.  Reasons to call and reschedule or cancel your procedure: (Following these recommendations will minimize the risk of a serious complication.) Surgeries: Avoid having procedures within 2 weeks of any surgery. (Avoid for 2 weeks  before or after any surgery). Flu Shots: Avoid having procedures within 2 weeks of a flu shots. (Avoid for 2 weeks before or after immunizations). Barium: Avoid having a procedure within 7-10 days after having had a radiological study involving the use of radiological contrast. (Myelograms, Barium swallow or enema  study). Heart attacks: Avoid any elective procedures or surgeries for the initial 6 months after a "Myocardial Infarction" (Heart Attack). Blood thinners: It is imperative that you stop these medications before procedures. Let us know if you if you take any blood thinner.  Infection: Avoid procedures during or within two weeks of an infection (including chest colds or gastrointestinal problems). Symptoms associated with infections include: Localized redness, fever, chills, night sweats or profuse sweating, burning sensation when voiding, cough, congestion, stuffiness, runny nose, sore throat, diarrhea, nausea, vomiting, cold or Flu symptoms, recent or current infections. It is specially important if the infection is over the area that we intend to treat. Heart and lung problems: Symptoms that may suggest an active cardiopulmonary problem include: cough, chest pain, breathing difficulties or shortness of breath, dizziness, ankle swelling, uncontrolled high or unusually low blood pressure, and/or palpitations. If you are experiencing any of these symptoms, cancel your procedure and contact your primary care physician for an evaluation.  Remember:  Regular Business hours are:  Monday to Thursday 8:00 AM to 4:00 PM  Provider's Schedule: Milinda Pointer, MD:  Procedure days: Tuesday and Thursday 7:30 AM to 4:00 PM  Gillis Santa, MD:  Procedure days: Monday and Wednesday 7:30 AM to 4:00 PM ______________________________________________________________________  ____________________________________________________________________________________________  General Risks and Possible  Complications  Patient Responsibilities: It is important that you read this as it is part of your informed consent. It is our duty to inform you of the risks and possible complications associated with treatments offered to you. It is your responsibility as a patient to read this and to ask questions about anything that is not clear or that you believe was not covered in this document.  Patients Rights: You have the right to refuse treatment. You also have the right to change your mind, even after initially having agreed to have the treatment done. However, under this last option, if you wait until the last second to change your mind, you may be charged for the materials used up to that point.  Introduction: Medicine is not an Chief Strategy Officer. Everything in Medicine, including the lack of treatment(s), carries the potential for danger, harm, or loss (which is by definition: Risk). In Medicine, a complication is a secondary problem, condition, or disease that can aggravate an already existing one. All treatments carry the risk of possible complications. The fact that a side effects or complications occurs, does not imply that the treatment was conducted incorrectly. It must be clearly understood that these can happen even when everything is done following the highest safety standards.  No treatment: You can choose not to proceed with the proposed treatment alternative. The PRO(s) would include: avoiding the risk of complications associated with the therapy. The CON(s) would include: not getting any of the treatment benefits. These benefits fall under one of three categories: diagnostic; therapeutic; and/or palliative. Diagnostic benefits include: getting information which can ultimately lead to improvement of the disease or symptom(s). Therapeutic benefits are those associated with the successful treatment of the disease. Finally, palliative benefits are those related to the decrease of the primary  symptoms, without necessarily curing the condition (example: decreasing the pain from a flare-up of a chronic condition, such as incurable terminal cancer).  General Risks and Complications: These are associated to most interventional treatments. They can occur alone, or in combination. They fall under one of the following six (6) categories: no benefit or worsening of symptoms; bleeding; infection; nerve damage; allergic reactions; and/or death. No benefits or  worsening of symptoms: In Medicine there are no guarantees, only probabilities. No healthcare provider can ever guarantee that a medical treatment will work, they can only state the probability that it may. Furthermore, there is always the possibility that the condition may worsen, either directly, or indirectly, as a consequence of the treatment. Bleeding: This is more common if the patient is taking a blood thinner, either prescription or over the counter (example: Goody Powders, Fish oil, Aspirin, Garlic, etc.), or if suffering a condition associated with impaired coagulation (example: Hemophilia, cirrhosis of the liver, low platelet counts, etc.). However, even if you do not have one on these, it can still happen. If you have any of these conditions, or take one of these drugs, make sure to notify your treating physician. Infection: This is more common in patients with a compromised immune system, either due to disease (example: diabetes, cancer, human immunodeficiency virus [HIV], etc.), or due to medications or treatments (example: therapies used to treat cancer and rheumatological diseases). However, even if you do not have one on these, it can still happen. If you have any of these conditions, or take one of these drugs, make sure to notify your treating physician. Nerve Damage: This is more common when the treatment is an invasive one, but it can also happen with the use of medications, such as those used in the treatment of cancer. The damage  can occur to small secondary nerves, or to large primary ones, such as those in the spinal cord and brain. This damage may be temporary or permanent and it may lead to impairments that can range from temporary numbness to permanent paralysis and/or brain death. Allergic Reactions: Any time a substance or material comes in contact with our body, there is the possibility of an allergic reaction. These can range from a mild skin rash (contact dermatitis) to a severe systemic reaction (anaphylactic reaction), which can result in death. Death: In general, any medical intervention can result in death, most of the time due to an unforeseen complication. ____________________________________________________________________________________________ Preparing for your procedure (without sedation) Instructions: Oral Intake: Do not eat or drink anything for at least 3 hours prior to your procedure. Transportation: Unless otherwise stated by your physician, you may drive yourself after the procedure. Blood Pressure Medicine: Take your blood pressure medicine with a sip of water the morning of the procedure. Insulin: Take only  of your normal insulin dose. Preventing infections: Shower with an antibacterial soap the morning of your procedure. Build-up your immune system: Take 1000 mg of Vitamin C with every meal (3 times a day) the day prior to your procedure. Pregnancy: If you are pregnant, call and cancel the procedure. Sickness: If you have a cold, fever, or any active infections, call and cancel the procedure. Arrival: You must be in the facility at least 30 minutes prior to your scheduled procedure. Children: Do not bring any children with you. Dress appropriately: Bring dark clothing that you would not mind if they get stained. Valuables: Do not bring any jewelry or valuables. Procedure appointments are reserved for interventional treatments only. No Prescription Refills. No medication changes will be  discussed during procedure appointments. No disability issues will be discussed.

## 2022-01-11 ENCOUNTER — Encounter: Payer: Medicare PPO | Admitting: *Deleted

## 2022-01-11 DIAGNOSIS — R0609 Other forms of dyspnea: Secondary | ICD-10-CM

## 2022-01-11 DIAGNOSIS — U099 Post covid-19 condition, unspecified: Secondary | ICD-10-CM

## 2022-01-11 NOTE — Progress Notes (Signed)
Daily Session Note  Patient Details  Name: Mariah Poole MRN: [id number] Date of Birth: 04/20/1946 Referring Provider:   Flowsheet Row Pulmonary Rehab from 12/18/2021 in [facility] Cardiac and Pulmonary Rehab  Referring Provider Dr. Christinia Gully       Encounter Date: 01/11/2022  Check In:  Session Check In - 01/11/22 1727       Check-In   Supervising physician immediately available to respond to emergencies See telemetry face sheet for immediately available ER MD    Location [facility]-Cardiac & Pulmonary Rehab    Staff Present Renita Papa, RN BSN;Joseph Tessie Fass, [place]    Virtual Visit No    Medication changes reported     No    Fall or balance concerns reported    No    Warm-up and Cool-down Performed on first and last piece of equipment    Resistance Training Performed Yes    VAD Patient? No    PAD/SET Patient? No      Pain Assessment   Currently in Pain? No/denies                Social History   Tobacco Use  Smoking Status Never  Smokeless Tobacco Never  Tobacco Comments   does not smoke     Goals Met:  Independence with exercise equipment Exercise tolerated well No report of concerns or symptoms today Strength training completed today  Goals Unmet:  Not Applicable  Comments: Pt able to follow exercise prescription today without complaint.  Will continue to monitor for progression.    Dr. Emily Filbert is Medical Director for [facility].  Dr. Ottie Glazier is Medical Director for [facility] Pulmonary Rehabilitation.

## 2022-01-15 ENCOUNTER — Other Ambulatory Visit: Payer: Self-pay

## 2022-01-15 ENCOUNTER — Other Ambulatory Visit
Admission: RE | Admit: 2022-01-15 | Discharge: 2022-01-15 | Disposition: A | Discharge status: Home / Self Care | Payer: Medicare PPO | Source: Ambulatory Visit | Attending: Adult Health | Admitting: Adult Health

## 2022-01-15 ENCOUNTER — Encounter: Payer: Medicare PPO | Admitting: *Deleted

## 2022-01-15 DIAGNOSIS — R0609 Other forms of dyspnea: Secondary | ICD-10-CM | POA: Diagnosis not present

## 2022-01-15 DIAGNOSIS — Z20822 Contact with and (suspected) exposure to covid-19: Secondary | ICD-10-CM | POA: Diagnosis not present

## 2022-01-15 DIAGNOSIS — U099 Post covid-19 condition, unspecified: Secondary | ICD-10-CM

## 2022-01-15 DIAGNOSIS — Z01812 Encounter for preprocedural laboratory examination: Secondary | ICD-10-CM | POA: Diagnosis present

## 2022-01-15 LAB — SARS CORONAVIRUS 2 (TAT 6-24 HRS): SARS Coronavirus 2: NEGATIVE

## 2022-01-15 NOTE — Progress Notes (Signed)
Daily Session Note  Patient Details  Name: ANEEKA Poole MRN: [id number] Date of Birth: [birth year] Referring Provider:   Flowsheet Row Pulmonary Rehab from 12/18/2021 in [facility] Cardiac and Pulmonary Rehab  Referring Provider Dr. Christinia Gully       Encounter Date: 01/15/2022  Check In:  Session Check In - 01/15/22 1722       Check-In   Supervising physician immediately available to respond to emergencies See telemetry face sheet for immediately available ER MD    Location [facility]-Cardiac & Pulmonary Rehab    Staff Present Renita Papa, RN BSN;Joseph Tessie Fass, [place]    Virtual Visit No    Medication changes reported     No    Fall or balance concerns reported    No    Warm-up and Cool-down Performed on first and last piece of equipment    Resistance Training Performed Yes    VAD Patient? No    PAD/SET Patient? No      Pain Assessment   Currently in Pain? No/denies                Social History   Tobacco Use  Smoking Status Never  Smokeless Tobacco Never  Tobacco Comments   does not smoke     Goals Met:  Independence with exercise equipment Exercise tolerated well No report of concerns or symptoms today Strength training completed today  Goals Unmet:  Not Applicable  Comments: Pt able to follow exercise prescription today without complaint.  Will continue to monitor for progression.    Dr. Emily Filbert is Medical Director for [facility].  Dr. Ottie Glazier is Medical Director for [facility] Pulmonary Rehabilitation.

## 2022-01-16 ENCOUNTER — Ambulatory Visit: Payer: Medicare PPO | Attending: Adult Health

## 2022-01-16 DIAGNOSIS — Z79899 Other long term (current) drug therapy: Secondary | ICD-10-CM | POA: Diagnosis not present

## 2022-01-16 DIAGNOSIS — R0602 Shortness of breath: Secondary | ICD-10-CM | POA: Diagnosis not present

## 2022-01-17 ENCOUNTER — Other Ambulatory Visit: Payer: Self-pay

## 2022-01-17 ENCOUNTER — Encounter: Payer: Medicare PPO | Attending: Internal Medicine

## 2022-01-17 DIAGNOSIS — U099 Post covid-19 condition, unspecified: Secondary | ICD-10-CM | POA: Insufficient documentation

## 2022-01-17 DIAGNOSIS — R0609 Other forms of dyspnea: Secondary | ICD-10-CM | POA: Diagnosis not present

## 2022-01-17 NOTE — Progress Notes (Signed)
Daily Session Note ? ?Patient Details  ?Name: Mariah Poole ?MRN: [id number] ?Date of Birth: [birth year] ?Referring Provider:   ?Flowsheet Row Pulmonary Rehab from 12/18/2021 in [facility] Cardiac and Pulmonary Rehab  ?Referring Provider Dr. Christinia Gully  ? ?  ? ? ?Encounter Date: 01/17/2022 ? ?Check In: ? Session Check In - 01/17/22 1638   ? ?  ? Check-In  ? Supervising physician immediately available to respond to emergencies See telemetry face sheet for immediately available ER MD   ? Location [facility]-Cardiac & Pulmonary Rehab   ? Staff Present Birdie Sons, MPA, Nino Glow, MS, ASCM CEP, Exercise Physiologist;Joseph Tessie Fass, RCP,RRT,BSRT;Meredith Sherryll Burger, RN BSN   ? Virtual Visit No   ? Medication changes reported     No   ? Fall or balance concerns reported    No   ? Warm-up and Cool-down Performed on first and last piece of equipment   ? Resistance Training Performed Yes   ? VAD Patient? No   ? PAD/SET Patient? No   ?  ? Pain Assessment  ? Currently in Pain? No/denies   ? ?  ?  ? ?  ? ? ? ? ? ?Social History  ? ?Tobacco Use  ?Smoking Status Never  ?Smokeless Tobacco Never  ?Tobacco Comments  ? does not smoke   ? ? ?Goals Met:  ?Independence with exercise equipment ?Exercise tolerated well ?No report of concerns or symptoms today ?Strength training completed today ? ?Goals Unmet:  ?Not Applicable ? ?Comments: Pt able to follow exercise prescription today without complaint.  Will continue to monitor for progression. ? ? ? ?Dr. Emily Filbert is Medical Director for [facility].  ?Dr. Ottie Glazier is Medical Director for [facility] Pulmonary Rehabilitation. ?

## 2022-01-17 NOTE — Progress Notes (Signed)
PROVIDER NOTE: Interpretation of information contained herein should be left to medically-trained personnel. Specific patient instructions are provided elsewhere under "Patient Instructions" section of medical record. This document was created in part using STT-dictation technology, any transcriptional errors that may result from this process are unintentional.  Patient: Mariah Poole Type: Established DOB: 07/26/1946 MRN: [id number] PCP: Leanna Sato, MD  Service: Procedure DOS: 01/18/2022 Setting: Ambulatory Location: Ambulatory outpatient facility Delivery: Face-to-face Provider: Oswaldo Done, MD Specialty: Interventional Pain Management Specialty designation: 09 Location: Outpatient facility Ref. Prov.: Delano Metz, MD    Primary Reason for Visit: Interventional Pain Management Treatment. CC: Back Pain   Procedure:           Type: Lumbar Facet, Medial Branch Block(s) #3  Laterality: Bilateral  Level: L2, L3, L4, L5, & S1 Medial Branch Level(s). Injecting these levels blocks the L3-4 and L5-S1 lumbar facet joints. Imaging: Fluoroscopic guidance Anesthesia: Local anesthesia (1-2% Lidocaine) Anxiolysis: None                 Sedation: None. DOS: 01/18/2022 Performed by: Oswaldo Done, MD  Primary Purpose: Diagnostic/Therapeutic Indications: Low back pain severe enough to impact quality of life or function. 1. Lumbar facet syndrome (Bilateral) (R>L)   2. Spondylosis without myelopathy or radiculopathy, lumbosacral region   3. Lumbar facet hypertrophy   4. DDD (degenerative disc disease), lumbar   5. Chronic low back pain (1ry area of Pain) (Bilateral) (R>L) w/o sciatica    History of contrast media allergy. (IVP dye)    Allergic to radiographic contrast media    Severe obesity (BMI 35.0-39.9) with comorbidity (HCC)    NAS-11 Pain score:   Pre-procedure: 5 /10   Post-procedure: 0-No pain/10     Position / Prep / Materials:  Position: Prone  Prep  solution: DuraPrep (Iodine Povacrylex [0.7% available iodine] and Isopropyl Alcohol, 74% w/w) Area Prepped: Posterolateral Lumbosacral Spine (Wide prep: From the lower border of the scapula down to the end of the tailbone and from flank to flank.)  Materials:  Tray: Block Needle(s):  Type: Spinal  Gauge (G): 22  Length: 5-in Qty: 4  Pre-op H&P Assessment:  Mariah Poole is a 76 y.o. (year old), female patient, seen today for interventional treatment. She  has a past surgical history that includes Abdominal hysterectomy; Cataract extraction w/ intraocular lens implant (Right); Gastric bypass; Hernia repair; Cataract extraction w/PHACO (Left, 07/19/2015); Cholecystectomy; and Hernia repair. Mariah Poole has a current medication list which includes the following prescription(s): atenolol, budesonide-formoterol, vitamin d3, duloxetine, famotidine, ferosul, furosemide, hydrocodone-acetaminophen, [START ON 02/13/2022] hydrocodone-acetaminophen, [START ON 03/15/2022] hydrocodone-acetaminophen, losartan, pantoprazole, potassium chloride sa, pravastatin, spironolactone, symbicort, turmeric curcumin, vitamin c, and albuterol. Her primarily concern today is the Back Pain  Initial Vital Signs:  Pulse/HCG Rate: 80  Temp: 98 F (36.7 C) Resp: 16 BP: 138/80 SpO2: 98 %  BMI: Estimated body mass index is 37.89 kg/m as calculated from the following:   Height as of this encounter: 5' (1.524 m).   Weight as of this encounter: 194 lb (88 kg).  Risk Assessment: Allergies: Reviewed. She is allergic to contrast media [iodinated contrast media], ioxaglate, and shellfish allergy.  Allergy Precautions: None required Coagulopathies: Reviewed. None identified.  Blood-thinner therapy: None at this time Active Infection(s): Reviewed. None identified. Mariah Poole is afebrile  Site Confirmation: Mariah Poole was asked to confirm the procedure and laterality before marking the site Procedure checklist: Completed Consent:  Before the procedure and under the influence of no sedative(s),  amnesic(s), or anxiolytics, the patient was informed of the treatment options, risks and possible complications. To fulfill our ethical and legal obligations, as recommended by the American Medical Association's Code of Ethics, I have informed the patient of my clinical impression; the nature and purpose of the treatment or procedure; the risks, benefits, and possible complications of the intervention; the alternatives, including doing nothing; the risk(s) and benefit(s) of the alternative treatment(s) or procedure(s); and the risk(s) and benefit(s) of doing nothing. The patient was provided information about the general risks and possible complications associated with the procedure. These may include, but are not limited to: failure to achieve desired goals, infection, bleeding, organ or nerve damage, allergic reactions, paralysis, and death. In addition, the patient was informed of those risks and complications associated to Spine-related procedures, such as failure to decrease pain; infection (i.e.: Meningitis, epidural or intraspinal abscess); bleeding (i.e.: epidural hematoma, subarachnoid hemorrhage, or any other type of intraspinal or peri-dural bleeding); organ or nerve damage (i.e.: Any type of peripheral nerve, nerve root, or spinal cord injury) with subsequent damage to sensory, motor, and/or autonomic systems, resulting in permanent pain, numbness, and/or weakness of one or several areas of the body; allergic reactions; (i.e.: anaphylactic reaction); and/or death. Furthermore, the patient was informed of those risks and complications associated with the medications. These include, but are not limited to: allergic reactions (i.e.: anaphylactic or anaphylactoid reaction(s)); adrenal axis suppression; blood sugar elevation that in diabetics may result in ketoacidosis or comma; water retention that in patients with history of congestive heart  failure may result in shortness of breath, pulmonary edema, and decompensation with resultant heart failure; weight gain; swelling or edema; medication-induced neural toxicity; particulate matter embolism and blood vessel occlusion with resultant organ, and/or nervous system infarction; and/or aseptic necrosis of one or more joints. Finally, the patient was informed that Medicine is not an exact science; therefore, there is also the possibility of unforeseen or unpredictable risks and/or possible complications that may result in a catastrophic outcome. The patient indicated having understood very clearly. We have given the patient no guarantees and we have made no promises. Enough time was given to the patient to ask questions, all of which were answered to the patient's satisfaction. Ms. Ramsburg has indicated that she wanted to continue with the procedure. Attestation: I, the ordering provider, attest that I have discussed with the patient the benefits, risks, side-effects, alternatives, likelihood of achieving goals, and potential problems during recovery for the procedure that I have provided informed consent. Date  Time: 01/18/2022 11:23 AM  Pre-Procedure Preparation:  Monitoring: As per clinic protocol. Respiration, ETCO2, SpO2, BP, heart rate and rhythm monitor placed and checked for adequate function Safety Precautions: Patient was assessed for positional comfort and pressure points before starting the procedure. Time-out: I initiated and conducted the "Time-out" before starting the procedure, as per protocol. The patient was asked to participate by confirming the accuracy of the "Time Out" information. Verification of the correct person, site, and procedure were performed and confirmed by me, the nursing staff, and the patient. "Time-out" conducted as per Joint Commission's Universal Protocol (UP.01.01.01). Time: 1137  Description of Procedure:          Laterality: Bilateral. The procedure was  performed in identical fashion on both sides. Targeted Levels:  L2, L3, L4, L5, & S1 Medial Branch Level(s)  Safety Precautions: Aspiration looking for blood return was conducted prior to all injections. At no point did we inject any substances, as a needle was  being advanced. Before injecting, the patient was told to immediately notify me if she was experiencing any new onset of "ringing in the ears, or metallic taste in the mouth". No attempts were made at seeking any paresthesias. Safe injection practices and needle disposal techniques used. Medications properly checked for expiration dates. SDV (single dose vial) medications used. After the completion of the procedure, all disposable equipment used was discarded in the proper designated medical waste containers. Local Anesthesia: Protocol guidelines were followed. The patient was positioned over the fluoroscopy table. The area was prepped in the usual manner. The time-out was completed. The target area was identified using fluoroscopy. A 12-in long, straight, sterile hemostat was used with fluoroscopic guidance to locate the targets for each level blocked. Once located, the skin was marked with an approved surgical skin marker. Once all sites were marked, the skin (epidermis, dermis, and hypodermis), as well as deeper tissues (fat, connective tissue and muscle) were infiltrated with a small amount of a short-acting local anesthetic, loaded on a 10cc syringe with a 25G, 1.5-in  Needle. An appropriate amount of time was allowed for local anesthetics to take effect before proceeding to the next step. Local Anesthetic: Lidocaine 2.0% The unused portion of the local anesthetic was discarded in the proper designated containers. Technical description of process:  L2 Medial Branch Nerve Block (MBB): The target area for the L2 medial branch is at the junction of the postero-lateral aspect of the superior articular process and the superior, posterior, and medial  edge of the transverse process of L3. Under fluoroscopic guidance, a Quincke needle was inserted until contact was made with os over the superior postero-lateral aspect of the pedicular shadow (target area). After negative aspiration for blood, 0.5 mL of the nerve block solution was injected without difficulty or complication. The needle was removed intact. L3 Medial Branch Nerve Block (MBB): The target area for the L3 medial branch is at the junction of the postero-lateral aspect of the superior articular process and the superior, posterior, and medial edge of the transverse process of L4. Under fluoroscopic guidance, a Quincke needle was inserted until contact was made with os over the superior postero-lateral aspect of the pedicular shadow (target area). After negative aspiration for blood, 0.5 mL of the nerve block solution was injected without difficulty or complication. The needle was removed intact. L4 Medial Branch Nerve Block (MBB): The target area for the L4 medial branch is at the junction of the postero-lateral aspect of the superior articular process and the superior, posterior, and medial edge of the transverse process of L5. Under fluoroscopic guidance, a Quincke needle was inserted until contact was made with os over the superior postero-lateral aspect of the pedicular shadow (target area). After negative aspiration for blood, 0.5 mL of the nerve block solution was injected without difficulty or complication. The needle was removed intact. L5 Medial Branch Nerve Block (MBB): The target area for the L5 medial branch is at the junction of the postero-lateral aspect of the superior articular process and the superior, posterior, and medial edge of the sacral ala. Under fluoroscopic guidance, a Quincke needle was inserted until contact was made with os over the superior postero-lateral aspect of the pedicular shadow (target area). After negative aspiration for blood, 0.5 mL of the nerve block solution  was injected without difficulty or complication. The needle was removed intact. S1 Medial Branch Nerve Block (MBB): The target area for the S1 medial branch is at the posterior and inferior 6 o'clock  position of the L5-S1 facet joint. Under fluoroscopic guidance, the Quincke needle inserted for the L5 MBB was redirected until contact was made with os over the inferior and postero aspect of the sacrum, at the 6 o' clock position under the L5-S1 facet joint (Target area). After negative aspiration for blood, 0.5 mL of the nerve block solution was injected without difficulty or complication. The needle was removed intact.  Once the entire procedure was completed, the treated area was cleaned, making sure to leave some of the prepping solution back to take advantage of its long term bactericidal properties.      Illustration of the posterior view of the lumbar spine and the posterior neural structures. Laminae of L2 through S1 are labeled. DPRL5, dorsal primary ramus of L5; DPRS1, dorsal primary ramus of S1; DPR3, dorsal primary ramus of L3; FJ, facet (zygapophyseal) joint L3-L4; I, inferior articular process of L4; LB1, lateral branch of dorsal primary ramus of L1; IAB, inferior articular branches from L3 medial branch (supplies L4-L5 facet joint); IBP, intermediate branch plexus; MB3, medial branch of dorsal primary ramus of L3; NR3, third lumbar nerve root; S, superior articular process of L5; SAB, superior articular branches from L4 (supplies L4-5 facet joint also); TP3, transverse process of L3.  Vitals:   01/18/22 1121 01/18/22 1135 01/18/22 1145 01/18/22 1149  BP: 138/80 (!) 168/109 (!) 151/101 (!) 152/99  Pulse: 80 93 84 85  Resp: 16 20 16 17   Temp: 98 F (36.7 C)     SpO2: 98% 100% 100% 100%  Weight: 194 lb (88 kg)     Height: 5' (1.524 m)        Start Time: 1137 hrs. End Time: 1146 hrs.  Imaging Guidance (Spinal):          Type of Imaging Technique: Fluoroscopy Guidance  (Spinal) Indication(s): Assistance in needle guidance and placement for procedures requiring needle placement in or near specific anatomical locations not easily accessible without such assistance. Exposure Time: Please see nurses notes. Contrast: None used. Fluoroscopic Guidance: I was personally present during the use of fluoroscopy. "Tunnel Vision Technique" used to obtain the best possible view of the target area. Parallax error corrected before commencing the procedure. "Direction-depth-direction" technique used to introduce the needle under continuous pulsed fluoroscopy. Once target was reached, antero-posterior, oblique, and lateral fluoroscopic projection used confirm needle placement in all planes. Images permanently stored in EMR. Interpretation: No contrast injected. I personally interpreted the imaging intraoperatively. Adequate needle placement confirmed in multiple planes. Permanent images saved into the patient's record.  Antibiotic Prophylaxis:   Anti-infectives (From admission, onward)    None      Indication(s): None identified  Post-operative Assessment:  Post-procedure Vital Signs:  Pulse/HCG Rate: 85 (nsr)  Temp: 98 F (36.7 C) Resp: 17 BP: (!) 152/99 SpO2: 100 %  EBL: None  Complications: No immediate post-treatment complications observed by team, or reported by patient.  Note: The patient tolerated the entire procedure well. A repeat set of vitals were taken after the procedure and the patient was kept under observation following institutional policy, for this type of procedure. Post-procedural neurological assessment was performed, showing return to baseline, prior to discharge. The patient was provided with post-procedure discharge instructions, including a section on how to identify potential problems. Should any problems arise concerning this procedure, the patient was given instructions to immediately contact us, at any time, without hesitation. In any case,  we plan to contact the patient by telephone for a follow-up status  report regarding this interventional procedure.  Comments:  No additional relevant information.  Plan of Care  Orders:  Orders Placed This Encounter  Procedures   LUMBAR FACET(MEDIAL BRANCH NERVE BLOCK) MBNB    Scheduling Instructions:     Procedure: Lumbar facet block (AKA.: Lumbosacral medial branch nerve block)     Side: Bilateral     Level: L3-4 & L5-S1 Facets (L2, L3, L4, L5, & S1 Medial Branch Nerves)     Sedation: Patient's choice.     Timeframe: Today    Order Specific Question:   Where will this procedure be performed?    Answer:   [facility]   DG [facility] C-ARM 1-60 MIN NO REPORT    Intraoperative interpretation by procedural physician at [facility].    Standing Status:   Standing    Number of Occurrences:   1    Order Specific Question:   Reason for exam:    Answer:   Assistance in needle guidance and placement for procedures requiring needle placement in or near specific anatomical locations not easily accessible without such assistance.   Informed Consent Details: Physician/Practitioner Attestation; Transcribe to consent form and obtain patient signature    Nursing Order: Transcribe to consent form and obtain patient signature. Note: Always confirm laterality of pain with Ms. Hellstrom, before procedure.    Order Specific Question:   Physician/Practitioner attestation of informed consent for procedure/surgical case    Answer:   I, the physician/practitioner, attest that I have discussed with the patient the benefits, risks, side effects, alternatives, likelihood of achieving goals and potential problems during recovery for the procedure that I have provided informed consent.    Order Specific Question:   Procedure    Answer:   Lumbar Facet Block  under fluoroscopic guidance    Order Specific Question:   Physician/Practitioner performing the procedure    Answer:   Shonya Sumida A. Laban Emperor  MD    Order Specific Question:   Indication/Reason    Answer:   Low Back Pain, with our without leg pain, due to Facet Joint Arthralgia (Joint Pain) Spondylosis (Arthritis of the Spine), without myelopathy or radiculopathy (Nerve Damage).   Provide equipment / supplies at bedside    "Block Tray" (Disposable  single use) Needle type: SpinalSpinal Amount/quantity: 4 Size: Medium (5-inch) Gauge: 22G    Standing Status:   Standing    Number of Occurrences:   1    Order Specific Question:   Specify    Answer:   Block Tray   Miscellanous precautions    Standing Status:   Standing    Number of Occurrences:   1   Chronic Opioid Analgesic:  Hydrocodone/APAP 10/325, 1 tab PO q 8 hrs PRN (30 mg/day of hydrocodone) (975 mg/day of acetaminophen) MME/day: 30 mg/day.   Medications ordered for procedure: Meds ordered this encounter  Medications   lidocaine (XYLOCAINE) 2 % (with pres) injection 400 mg   pentafluoroprop-tetrafluoroeth (GEBAUERS) aerosol   ropivacaine (PF) 2 mg/mL (0.2%) (NAROPIN) injection 18 mL   triamcinolone acetonide (KENALOG-40) injection 80 mg   Medications administered: We administered lidocaine, pentafluoroprop-tetrafluoroeth, ropivacaine (PF) 2 mg/mL (0.2%), and triamcinolone acetonide.  See the medical record for exact dosing, route, and time of administration.  Follow-up plan:   Return in about 2 weeks (around 02/01/2022) for Proc-day (T,Th), (VV), (PPE).       Interventional Therapies  Risk  Complexity Considerations:   Estimated body mass index is 37.89 kg/m as calculated from the  following:   Height as of this encounter: 5' (1.524 m).   Weight as of this encounter: 194 lb (88 kg). No RFA until patient brings BMI down to less than 30 kg/m   Planned  Pending:      Under consideration:   Possible bilateral lumbar facet RFA #1 (on hold until BMI is brought down to 30 kg/m)   Completed:   Diagnostic bilateral lumbar facet MBB x3 (01/18/2022) (5/10 to  0/10)    Therapeutic  Palliative (PRN) options:   Therapeutic/palliative bilateral lumbar facet MBB    Recent Visits Date Type Provider Dept  01/10/22 Office Visit Delano Metz, MD [facility]  Showing recent visits within past 90 days and meeting all other requirements Today's Visits Date Type Provider Dept  01/18/22 Procedure visit Delano Metz, MD [facility]  Showing today's visits and meeting all other requirements Future Appointments Date Type Provider Dept  01/30/22 Appointment Delano Metz, MD [facility]  03/29/22 Appointment Delano Metz, MD [facility]  Showing future appointments within next 90 days and meeting all other requirements  Disposition: Discharge home  Discharge (Date  Time): 01/18/2022; 1150 hrs.   Primary Care Physician: Leanna Sato, MD Location: [facility] Note by: Oswaldo Done, MD Date: 01/18/2022; Time: 12:02 PM  Disclaimer:  Medicine is not an Visual merchandiser. The only guarantee in medicine is that nothing is guaranteed. It is important to note that the decision to proceed with this intervention was based on the information collected from the patient. The Data and conclusions were drawn from the patient's questionnaire, the interview, and the physical examination. Because the information was provided in large part by the patient, it cannot be guaranteed that it has not been purposely or unconsciously manipulated. Every effort has been made to obtain as much relevant data as possible for this evaluation. It is important to note that the conclusions that lead to this procedure are derived in large part from the available data. Always take into account that the treatment will also be dependent on availability of resources and existing treatment guidelines, considered by other Pain Management Practitioners as being common knowledge and practice, at the time of the  intervention. For Medico-Legal purposes, it is also important to point out that variation in procedural techniques and pharmacological choices are the acceptable norm. The indications, contraindications, technique, and results of the above procedure should only be interpreted and judged by a Board-Certified Interventional Pain Specialist with extensive familiarity and expertise in the same exact procedure and technique.

## 2022-01-18 ENCOUNTER — Encounter: Payer: Self-pay | Admitting: Pain Medicine

## 2022-01-18 ENCOUNTER — Ambulatory Visit (HOSPITAL_BASED_OUTPATIENT_CLINIC_OR_DEPARTMENT_OTHER): Payer: Medicare PPO | Admitting: Pain Medicine

## 2022-01-18 ENCOUNTER — Encounter: Payer: Medicare PPO | Admitting: *Deleted

## 2022-01-18 ENCOUNTER — Ambulatory Visit
Admission: RE | Admit: 2022-01-18 | Discharge: 2022-01-18 | Disposition: A | Discharge status: Home / Self Care | Payer: Medicare PPO | Source: Ambulatory Visit | Attending: Pain Medicine | Admitting: Pain Medicine

## 2022-01-18 VITALS — BP 152/99 | HR 85 | Temp 98.0°F | Resp 17 | Ht 60.0 in | Wt 194.0 lb

## 2022-01-18 DIAGNOSIS — U099 Post covid-19 condition, unspecified: Secondary | ICD-10-CM

## 2022-01-18 DIAGNOSIS — M545 Low back pain, unspecified: Secondary | ICD-10-CM | POA: Diagnosis present

## 2022-01-18 DIAGNOSIS — Z91041 Radiographic dye allergy status: Secondary | ICD-10-CM

## 2022-01-18 DIAGNOSIS — R0609 Other forms of dyspnea: Secondary | ICD-10-CM

## 2022-01-18 DIAGNOSIS — G8929 Other chronic pain: Secondary | ICD-10-CM | POA: Insufficient documentation

## 2022-01-18 DIAGNOSIS — M47816 Spondylosis without myelopathy or radiculopathy, lumbar region: Secondary | ICD-10-CM | POA: Insufficient documentation

## 2022-01-18 DIAGNOSIS — M47817 Spondylosis without myelopathy or radiculopathy, lumbosacral region: Secondary | ICD-10-CM | POA: Insufficient documentation

## 2022-01-18 DIAGNOSIS — M5136 Other intervertebral disc degeneration, lumbar region: Secondary | ICD-10-CM | POA: Diagnosis present

## 2022-01-18 MED ORDER — TRIAMCINOLONE ACETONIDE 40 MG/ML IJ SUSP
INTRAMUSCULAR | Status: AC
  Filled 2022-01-18: qty 1

## 2022-01-18 MED ORDER — TRIAMCINOLONE ACETONIDE 40 MG/ML IJ SUSP
80.0000 mg | Freq: Once | INTRAMUSCULAR | Status: AC
  Administered 2022-01-18: 80 mg

## 2022-01-18 MED ORDER — PENTAFLUOROPROP-TETRAFLUOROETH EX AERO
INHALATION_SPRAY | Freq: Once | CUTANEOUS | Status: AC
Start: 2022-01-18 — End: 2022-01-18
  Filled 2022-01-18: qty 116

## 2022-01-18 MED ORDER — ROPIVACAINE HCL 2 MG/ML IJ SOLN
18.0000 mL | Freq: Once | INTRAMUSCULAR | Status: AC
  Administered 2022-01-18: 18 mL via PERINEURAL

## 2022-01-18 MED ORDER — LIDOCAINE HCL 2 % IJ SOLN
INTRAMUSCULAR | Status: AC
  Filled 2022-01-18: qty 20

## 2022-01-18 MED ORDER — LIDOCAINE HCL 2 % IJ SOLN
20.0000 mL | Freq: Once | INTRAMUSCULAR | Status: AC
  Administered 2022-01-18: 400 mg

## 2022-01-18 MED ORDER — ROPIVACAINE HCL 2 MG/ML IJ SOLN
INTRAMUSCULAR | Status: AC
  Filled 2022-01-18: qty 20

## 2022-01-18 NOTE — Patient Instructions (Addendum)
____________________________________________________________________________________________  Virtual Visits   What is a "Virtual Visit"? It is a Metallurgist (medical visit) that takes place on real time (NOT TEXT or E-MAIL) over the telephone or computer device (desktop, laptop, tablet, smart phone, etc.). It allows for more location flexibility between the patient and the healthcare provider.  Who decides when these types of visits will be used? The physician.  Who is eligible for these types of visits? Only those patients that can be reliably reached over the telephone.  What do you mean by reliably? We do not have time to call everyone multiple times, therefore those that tend to screen calls and then call back later are not suitable candidates for this system. We understand how people are reluctant to pickup on "unknown" calls, therefore, we suggest adding our telephone numbers to your list of "CONTACT(s)". This way, you should be able to readily identify our calls when you receive one. All of our numbers are available below.   Who is not eligible? This option is not available for medication management encounters, specially for controlled substances. Patients on pain medications that fall under the category of controlled substances have to come in for "Face-to-Face" encounters. This is required for mandatory monitoring of these substances. You may be asked to provide a sample for an unannounced urine drug screening test (UDS), and we will need to count your pain pills. Not bringing your pills to be counted may result in no refill. Obviously, neither one of these can be done over the phone.  When will this type of visits be used? You can request a virtual visit whenever you are physically unable to attend a regular appointment. The decision will be made by the physician (or healthcare provider) on a case by case basis.   At what time will I be called? This is an  excellent question. The providers will try to call you whenever they have time available. Do not expect to be called at any specific time. The secretaries will assign you a time for your virtual visit appointment, but this is done simply to keep a list of those patients that need to be called, but not for the purpose of keeping a time schedule. Be advised that the call may come in anytime during the day, between the hours of 8:00 AM and 8::00 PM, depending on provider availability. We do understand that the system is not perfect. If you are unable to be available that day on a moments notice, then request an "in-person" appointment rather than a "virtual visit".  Can I request my medication visits to be "Virtual"? Yes you may request it, but the decision is entirely up to the healthcare provider. Control substances require specific monitoring that requires Face-to-Face encounters. The number of encounters  and the extent of the monitoring is determined on a case by case basis.  Add a new contact to your smart phone and label it "PAIN CLINIC" Under this contact add the following numbers: Main: [phone] (Official Contact Number) Nurses: [phone] (These are outgoing only calling systems. Do not call this number.) Dr. Dossie Arbour: [phone] or [phone] (Outgoing calls only. Do not call this number.)  ____________________________________________________________________________________________   ______________________________________________________________________________________________  Body mass index (BMI)  Body mass index (BMI) is a common tool for deciding whether a person has an appropriate body weight.  It measures a persons weight in relation to their height.   According to the [employer] of health (NIH): A BMI of  less than 18.5 means that a person is underweight. A BMI of between 18.5 and 24.9 is ideal. A BMI of between 25 and 29.9 is overweight. A BMI over 30  indicates obesity.  Weight Management Required  URGENT: Your weight has been found to be adversely affecting your health.  Dear [name]:  Your current Estimated body mass index is 37.89 kg/m as calculated from the following:   Height as of 01/10/22: 5' (1.524 m).   Weight as of 01/10/22: 194 lb (88 kg).  Please use the table below to identify your weight category and associated incidence of chronic pain, secondary to your weight.  Body Mass Index (BMI) Classification BMI level (kg/m2) Category Associated incidence of chronic pain  <18  Underweight   18.5-24.9 Ideal body weight   25-29.9 Overweight  20%  30-34.9 Obese (Class I)  68%  35-39.9 Severe obesity (Class II)  136%  >40 Extreme obesity (Class III)  254%   In addition: You will be considered "Morbidly Obese", if your BMI is above 30 and you have one or more of the following conditions which are known to be caused and/or directly associated with obesity: 1.    Type 2 Diabetes (Which in turn can lead to cardiovascular diseases (CVD), stroke, peripheral vascular diseases (PVD), retinopathy, nephropathy, and neuropathy) 2.    Cardiovascular Disease (High Blood Pressure; Congestive Heart Failure; High Cholesterol; Coronary Artery Disease; Angina; or History of Heart Attacks) 3.    Breathing problems (Asthma; obesity-hypoventilation syndrome; obstructive sleep apnea; chronic inflammatory airway disease; reactive airway disease; or shortness of breath) 4.    Chronic kidney disease 5.    Liver disease (nonalcoholic fatty liver disease) 6.    High blood pressure 7.    Acid reflux (gastroesophageal reflux disease; heartburn) 8.    Osteoarthritis (OA) (with any of the following: hip pain; knee pain; and/or low back pain) 9.    Low back pain (Lumbar Facet Syndrome; and/or Degenerative Disc Disease) 10.  Hip pain (Osteoarthritis of hip) (For every 1 lbs of added body weight, there is a 2 lbs increase in pressure inside of each hip  articulation. 1:2 mechanical relationship) 11.  Knee pain (Osteoarthritis of knee) (For every 1 lbs of added body weight, there is a 4 lbs increase in pressure inside of each knee articulation. 1:4 mechanical relationship) (patients with a BMI>30 kg/m2 were 6.8 times more likely to develop knee OA than normal-weight individuals) 12.  Cancer: Epidemiological studies have shown that obesity is a risk factor for: post-menopausal breast cancer; cancers of the endometrium, colon and kidney cancer; malignant adenomas of the oesophagus. Obese subjects have an approximately 1.5-3.5-fold increased risk of developing these cancers compared with normal-weight subjects, and it has been estimated that between 15 and 45% of these cancers can be attributed to overweight. More recent studies suggest that obesity may also increase the risk of other types of cancer, including pancreatic, hepatic and gallbladder cancer. (Ref: Obesity and cancer. Pischon T, Nthlings U, [employer] H. Proc Nutr Soc. 2008 May;67(2):128-45. doi: 61.6837/G9021115520802233.) The International Agency for Research on Cancer (IARC) has identified 13 cancers associated with overweight and obesity: meningioma, multiple myeloma, adenocarcinoma of the esophagus, and cancers of the thyroid, postmenopausal breast cancer, gallbladder, stomach, liver, pancreas, kidney, ovaries, uterus, colon and rectal (colorectal) cancers. 69 percent of all cancers diagnosed in women and 24 percent of those diagnosed in men are associated with overweight and obesity.  Recommendation: At this point it is urgent that you take a  step back and concentrate in loosing weight. Dedicate 100% of your efforts on this task. Nothing else will improve your health more than bringing your weight down and your BMI to less than 30. If you are here, you probably have chronic pain. We know that most chronic pain patients have difficulty exercising secondary to their pain. For this reason, you must rely  on proper nutrition and diet in order to lose the weight. If your BMI is above 40, you should seriously consider bariatric surgery. A realistic goal is to lose 10% of your body weight over a period of 12 months.  Be honest to yourself, if over time you have unsuccessfully tried to lose weight, then it is time for you to seek professional help and to enter a medically supervised weight management program, and/or undergo bariatric surgery. Stop procrastinating.   Pain management considerations:  1.    Pharmacological Problems: Be advised that the use of opioid analgesics (oxycodone; hydrocodone; morphine; methadone; codeine; and all of their derivatives) have been associated with decreased metabolism and weight gain.  For this reason, should we see that you are unable to lose weight while taking these medications, it may become necessary for [place] to taper down and indefinitely discontinue them.  2.    Technical Problems: The incidence of successful interventional therapies decreases as the patient's BMI increases. It is much more difficult to accomplish a safe and effective interventional therapy on a patient with a BMI above 35. 3.    Radiation Exposure Problems: The x-rays machine, used to accomplish injection therapies, will automatically increase their x-ray output in order to capture an appropriate bone image. This means that radiation exposure increases exponentially with the patient's BMI. (The higher the BMI, the higher the radiation exposure.) Although the level of radiation used at a given time is still safe to the patient, it is not for the physician and/or assisting staff. Unfortunately, radiation exposure is accumulative. Because physicians and the staff have to do procedures and be exposed on a daily basis, this can result in health problems such as cancer and radiation burns. Radiation exposure to the staff is monitored by the radiation batches that they wear. The exposure levels are reported back to  the staff on a quarterly basis. Depending on levels of exposure, physicians and staff may be obligated by law to decrease this exposure. This means that they have the right and obligation to refuse providing therapies where they may be overexposed to radiation. For this reason, physicians may decline to offer therapies such as radiofrequency ablation or implants to patients with a BMI above 40. 4.    Current Trends: Be advised that the current trend is to no longer offer certain therapies to patients with a BMI equal to, or above 35, due to increase perioperative risks, increased technical procedural difficulties, and excessive radiation exposure to healthcare personnel.  ______________________________________________________________________________________________   ____________________________________________________________________________________________  Post-Procedure Discharge Instructions  Instructions: Apply ice:  Purpose: This will minimize any swelling and discomfort after procedure.  When: Day of procedure, as soon as you get home. How: Fill a plastic sandwich bag with crushed ice. Cover it with a small towel and apply to injection site. How long: (15 min on, 15 min off) Apply for 15 minutes then remove x 15 minutes.  Repeat sequence on day of procedure, until you go to bed. Apply heat:  Purpose: To treat any soreness and discomfort from the procedure. When: Starting the next day after the procedure. How: Apply  heat to procedure site starting the day following the procedure. How long: May continue to repeat daily, until discomfort goes away. Food intake: Start with clear liquids (like water) and advance to regular food, as tolerated.  Physical activities: Keep activities to a minimum for the first 8 hours after the procedure. After that, then as tolerated. Driving: If you have received any sedation, be responsible and do not drive. You are not allowed to drive for 24 hours after  having sedation. Blood thinner: (Applies only to those taking blood thinners) You may restart your blood thinner 6 hours after your procedure. Insulin: (Applies only to Diabetic patients taking insulin) As soon as you can eat, you may resume your normal dosing schedule. Infection prevention: Keep procedure site clean and dry. Shower daily and clean area with soap and water. Post-procedure Pain Diary: Extremely important that this be done correctly and accurately. Recorded information will be used to determine the next step in treatment. For the purpose of accuracy, follow these rules: Evaluate only the area treated. Do not report or include pain from an untreated area. For the purpose of this evaluation, ignore all other areas of pain, except for the treated area. After your procedure, avoid taking a long nap and attempting to complete the pain diary after you wake up. Instead, set your alarm clock to go off every hour, on the hour, for the initial 8 hours after the procedure. Document the duration of the numbing medicine, and the relief you are getting from it. Do not go to sleep and attempt to complete it later. It will not be accurate. If you received sedation, it is likely that you were given a medication that may cause amnesia. Because of this, completing the diary at a later time may cause the information to be inaccurate. This information is needed to plan your care. Follow-up appointment: Keep your post-procedure follow-up evaluation appointment after the procedure (usually 2 weeks for most procedures, 6 weeks for radiofrequencies). DO NOT FORGET to bring you pain diary with you.   Expect: (What should I expect to see with my procedure?) From numbing medicine (AKA: Local Anesthetics): Numbness or decrease in pain. You may also experience some weakness, which if present, could last for the duration of the local anesthetic. Onset: Full effect within 15 minutes of injected. Duration: It will depend  on the type of local anesthetic used. On the average, 1 to 8 hours.  From steroids (Applies only if steroids were used): Decrease in swelling or inflammation. Once inflammation is improved, relief of the pain will follow. Onset of benefits: Depends on the amount of swelling present. The more swelling, the longer it will take for the benefits to be seen. In some cases, up to 10 days. Duration: Steroids will stay in the system x 2 weeks. Duration of benefits will depend on multiple posibilities including persistent irritating factors. Side-effects: If present, they may typically last 2 weeks (the duration of the steroids). Frequent: Cramps (if they occur, drink Gatorade and take over-the-counter Magnesium 450-500 mg once to twice a day); water retention with temporary weight gain; increases in blood sugar; decreased immune system response; increased appetite. Occasional: Facial flushing (red, warm cheeks); mood swings; menstrual changes. Uncommon: Long-term decrease or suppression of natural hormones; bone thinning. (These are more common with higher doses or more frequent use. This is why we prefer that our patients avoid having any injection therapies in other practices.)  Very Rare: Severe mood changes; psychosis; aseptic necrosis. From procedure:  Some discomfort is to be expected once the numbing medicine wears off. This should be minimal if ice and heat are applied as instructed.  Call if: (When should I call?) You experience numbness and weakness that gets worse with time, as opposed to wearing off. New onset bowel or bladder incontinence. (Applies only to procedures done in the spine)  Emergency Numbers: Durning business hours (Monday - Thursday, 8:00 AM - 4:00 PM) (Friday, 9:00 AM - 12:00 Noon): [phone] After hours: [phone] NOTE: If you are having a problem and are unable connect with, or to talk to a provider, then go to your nearest urgent care or emergency department. If the  problem is serious and urgent, please call 911. ____________________________________________________________________________________________

## 2022-01-18 NOTE — Progress Notes (Signed)
Safety precautions to be maintained throughout the outpatient stay will include: orient to surroundings, keep bed in low position, maintain call bell within reach at all times, provide assistance with transfer out of bed and ambulation.  

## 2022-01-18 NOTE — Progress Notes (Signed)
LMOM for patient to call office back. Call back information provided.

## 2022-01-18 NOTE — Progress Notes (Signed)
Daily Session Note ? ?Patient Details  ?Name: Mariah Poole ?MRN: [id number] ?Date of Birth: 11-19-1946 ?Referring Provider:   ?Flowsheet Row Pulmonary Rehab from 12/18/2021 in [facility] Cardiac and Pulmonary Rehab  ?Referring Provider Dr. Christinia Gully  ? ?  ? ? ?Encounter Date: 01/18/2022 ? ?Check In: ? Session Check In - 01/18/22 1721   ? ?  ? Check-In  ? Supervising physician immediately available to respond to emergencies See telemetry face sheet for immediately available ER MD   ? Location [facility]-Cardiac & Pulmonary Rehab   ? Staff Present Renita Papa, RN BSN;Joseph [place], [place]   ? Virtual Visit No   ? Medication changes reported     No   ? Fall or balance concerns reported    No   ? Warm-up and Cool-down Performed on first and last piece of equipment   ? Resistance Training Performed Yes   ? VAD Patient? No   ? PAD/SET Patient? No   ?  ? Pain Assessment  ? Currently in Pain? No/denies   ? ?  ?  ? ?  ? ? ? ? ? ?Social History  ? ?Tobacco Use  ?Smoking Status Never  ?Smokeless Tobacco Never  ?Tobacco Comments  ? does not smoke   ? ? ?Goals Met:  ?Independence with exercise equipment ?Exercise tolerated well ?No report of concerns or symptoms today ?Strength training completed today ? ?Goals Unmet:  ?Not Applicable ? ?Comments: Pt able to follow exercise prescription today without complaint.  Will continue to monitor for progression. ? ? ? ?Dr. Emily Filbert is Medical Director for [facility].  ?Dr. Ottie Glazier is Medical Director for [facility] Pulmonary Rehabilitation. ?

## 2022-01-19 ENCOUNTER — Telehealth: Payer: Self-pay

## 2022-01-19 NOTE — Telephone Encounter (Signed)
Post procedure follow up call.  Patient states she is doing good.  

## 2022-01-22 ENCOUNTER — Other Ambulatory Visit: Payer: Self-pay

## 2022-01-22 ENCOUNTER — Encounter: Payer: Medicare PPO | Admitting: *Deleted

## 2022-01-22 ENCOUNTER — Encounter: Payer: Self-pay | Admitting: *Deleted

## 2022-01-22 DIAGNOSIS — R0609 Other forms of dyspnea: Secondary | ICD-10-CM

## 2022-01-22 NOTE — Progress Notes (Signed)
ATC x2.  LMTCB.  Unable to reach letter sent.  Closed due to policy.

## 2022-01-22 NOTE — Progress Notes (Signed)
Daily Session Note ? ?Patient Details  ?Name: Mariah Poole ?MRN: [id number] ?Date of Birth: 09-Jul-1946 ?Referring Provider:   ?Flowsheet Row Pulmonary Rehab from 12/18/2021 in [facility] Cardiac and Pulmonary Rehab  ?Referring Provider Dr. Christinia Gully  ? ?  ? ? ?Encounter Date: 01/22/2022 ? ?Check In: ? Session Check In - 01/22/22 1707   ? ?  ? Check-In  ? Supervising physician immediately available to respond to emergencies See telemetry face sheet for immediately available ER MD   ? Location [facility]-Cardiac & Pulmonary Rehab   ? Staff Present Renita Papa, RN BSN;Joseph [place], RCP,RRT,BSRT;Kara Alta [place], [place], ASCM CEP, Exercise Physiologist   ? Virtual Visit No   ? Medication changes reported     No   ? Fall or balance concerns reported    No   ? Warm-up and Cool-down Performed on first and last piece of equipment   ? Resistance Training Performed Yes   ? VAD Patient? No   ? PAD/SET Patient? No   ?  ? Pain Assessment  ? Currently in Pain? No/denies   ? ?  ?  ? ?  ? ? ? ? ? ?Social History  ? ?Tobacco Use  ?Smoking Status Never  ?Smokeless Tobacco Never  ?Tobacco Comments  ? does not smoke   ? ? ?Goals Met:  ?Independence with exercise equipment ?Exercise tolerated well ?No report of concerns or symptoms today ?Strength training completed today ? ?Goals Unmet:  ?Not Applicable ? ?Comments: Pt able to follow exercise prescription today without complaint.  Will continue to monitor for progression. ? ? ? ?Dr. Emily Filbert is Medical Director for [facility].  ?Dr. Ottie Glazier is Medical Director for [facility] Pulmonary Rehabilitation. ?

## 2022-01-23 ENCOUNTER — Telehealth: Payer: Self-pay | Admitting: Internal Medicine

## 2022-01-23 NOTE — Telephone Encounter (Signed)
Mariah Sicks, NP  ?01/18/2022  3:04 PM EST   ?  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  Need office visit to discuss in detail  ? ? ?Patient is aware of results and voiced her understanding.  ?Appt scheduled 03/09/2022 at 10:00. Offered sooner appt in [place] and patient declined  ?Nothing further needed.  ? ?

## 2022-01-24 ENCOUNTER — Other Ambulatory Visit: Payer: Self-pay

## 2022-01-24 DIAGNOSIS — R0609 Other forms of dyspnea: Secondary | ICD-10-CM

## 2022-01-24 DIAGNOSIS — U099 Post covid-19 condition, unspecified: Secondary | ICD-10-CM

## 2022-01-24 NOTE — Progress Notes (Signed)
Daily Session Note ? ?Patient Details  ?Name: Mariah Poole ?MRN: [id number] ?Date of Birth: 1946-05-31 ?Referring Provider:   ?Flowsheet Row Pulmonary Rehab from 12/18/2021 in [facility] Cardiac and Pulmonary Rehab  ?Referring Provider Dr. Christinia Gully  ? ?  ? ? ?Encounter Date: 01/24/2022 ? ?Check In: ? Session Check In - 01/24/22 1653   ? ?  ? Check-In  ? Supervising physician immediately available to respond to emergencies See telemetry face sheet for immediately available ER MD   ? Location [facility]-Cardiac & Pulmonary Rehab   ? Staff Present Birdie Sons, MPA, RN;Joseph [place], RCP,RRT,BSRT;Kara [place], MS, ASCM CEP, Exercise Physiologist   ? Virtual Visit No   ? Medication changes reported     No   ? Fall or balance concerns reported    No   ? Warm-up and Cool-down Performed on first and last piece of equipment   ? Resistance Training Performed Yes   ? VAD Patient? No   ? PAD/SET Patient? No   ?  ? Pain Assessment  ? Currently in Pain? No/denies   ? ?  ?  ? ?  ? ? ? ? ? ?Social History  ? ?Tobacco Use  ?Smoking Status Never  ?Smokeless Tobacco Never  ?Tobacco Comments  ? does not smoke   ? ? ?Goals Met:  ?Independence with exercise equipment ?Exercise tolerated well ?No report of concerns or symptoms today ?Strength training completed today ? ?Goals Unmet:  ?Not Applicable ? ?Comments: Pt able to follow exercise prescription today without complaint.  Will continue to monitor for progression. ? ? ? ?Dr. Emily Filbert is Medical Director for [facility].  ?Dr. Ottie Glazier is Medical Director for [facility] Pulmonary Rehabilitation. ?

## 2022-01-29 ENCOUNTER — Other Ambulatory Visit: Payer: Self-pay

## 2022-01-29 ENCOUNTER — Encounter: Payer: Medicare PPO | Admitting: *Deleted

## 2022-01-29 ENCOUNTER — Encounter: Payer: Self-pay | Admitting: Pain Medicine

## 2022-01-29 DIAGNOSIS — R0609 Other forms of dyspnea: Secondary | ICD-10-CM | POA: Diagnosis not present

## 2022-01-29 DIAGNOSIS — U099 Post covid-19 condition, unspecified: Secondary | ICD-10-CM

## 2022-01-29 NOTE — Progress Notes (Unsigned)
Patient: Mariah Poole  Service Category: E/M  Provider: Gaspar Cola, MD  DOB: [birth year]  DOS: 01/30/2022  Location: Office  MRN: [id number]  Setting: Ambulatory outpatient  Referring Provider: Marguerita Merles, MD  Type: Established Patient  Specialty: Interventional Pain Management  PCP: Marguerita Merles, MD  Location: Remote location  Delivery: TeleHealth     Virtual Encounter - Pain Management PROVIDER NOTE: Information contained herein reflects review and annotations entered in association with encounter. Interpretation of such information and data should be left to medically-trained personnel. Information provided to patient can be located elsewhere in the medical record under "Patient Instructions". Document created using STT-dictation technology, any transcriptional errors that may result from process are unintentional.    Contact & Pharmacy Preferred: [phone] Home: [phone] (home) Mobile: [phone] (mobile) E-mail: [email or web address]  [id number] [id number]: [phone]: [phone]  Pre-screening  Mariah Poole offered "in-person" vs "virtual" encounter. She indicated preferring virtual for this encounter.   Reason COVID-19*   Social distancing based on CDC and AMA recommendations.   I contacted ETannya Poole on 01/30/2022 via telephone.      I clearly identified myself as FGaspar Cola MD. I verified that I was speaking with the correct person using two identifiers (Name: Mariah Poole and date of birth: [birth year].  Consent I sought verbal advanced consent from EWynona Dovefor virtual visit interactions. I informed Mariah Poole of possible security and privacy concerns, risks, and limitations associated with providing "not-in-person" medical evaluation and management services. I also informed Mariah Poole of the availability of "in-person" appointments. Finally, I  informed her that there would be a charge for the virtual visit and that she could be  personally, fully or partially, financially responsible for it. [name] understanding and agreed to proceed.   Historic Elements   Ms. Mariah PAPALEOis a 76y.o. year old, female patient evaluated today after our last contact on 01/18/2022. [name] has a past medical history of Anemia, Arthritis, COPD (chronic obstructive pulmonary disease) ([place], Depression, Diabetes mellitus without complication ([place], GERD (gastroesophageal reflux disease), Hiatal hernia, History of contrast media allergy. (IVP dye) (09/01/2015), History of hiatal hernia, Hypercholesteremia, Hypertension, Morbid obesity ([place], Scoliosis, and Shortness of breath dyspnea. She also  has a past surgical history that includes Abdominal hysterectomy; Cataract extraction w/ intraocular lens implant (Right); Gastric bypass; Hernia repair; Cataract extraction w/PHACO (Left, 07/19/2015); Cholecystectomy; and Hernia repair. Ms. TRothgebhas a current medication list which includes the following prescription(s): albuterol, atenolol, budesonide-formoterol, vitamin d3, duloxetine, famotidine, ferosul, furosemide, hydrocodone-acetaminophen, [START ON 02/13/2022] hydrocodone-acetaminophen, [START ON 03/15/2022] hydrocodone-acetaminophen, losartan, pantoprazole, potassium chloride sa, pravastatin, symbicort, turmeric curcumin, vitamin c, and spironolactone. She  reports that she has never smoked. She has never used smokeless tobacco. She reports current drug use. She reports that she does not drink alcohol. Ms. TKopeckyis allergic to contrast media [iodinated contrast media], ioxaglate, and shellfish allergy.   HPI  Today, she is being contacted for  ***   Pharmacotherapy Assessment   Opioid Analgesic: Hydrocodone/APAP 10/325, 1 tab PO q 8 hrs PRN (30 mg/day of hydrocodone) (975 mg/day of acetaminophen) MME/day: 30 mg/day.   Monitoring: [place] PMP: PDMP  reviewed during this encounter.       Pharmacotherapy: No side-effects or adverse reactions reported. Compliance: No problems identified. Effectiveness: Clinically acceptable. Plan: Refer to "POC". UDS:  Summary  Date  Value Ref Range Status  07/19/2021 Note  Final    Comment:    ==================================================================== [employer] Select 13 (MW) ==================================================================== Test                             Result       Flag       Units  Drug Present and Declared for Prescription Verification   Hydrocodone                    696          EXPECTED   ng/mg creat   Hydromorphone                  42           EXPECTED   ng/mg creat   Dihydrocodeine                 332          EXPECTED   ng/mg creat   Norhydrocodone                 1671         EXPECTED   ng/mg creat    Sources of hydrocodone include scheduled prescription medications.    Hydromorphone, dihydrocodeine and norhydrocodone are expected    metabolites of hydrocodone. Hydromorphone and dihydrocodeine are    also available as scheduled prescription medications.  ==================================================================== Test                      Result    Flag   Units      Ref Range   Creatinine              195              mg/dL      >=20 ==================================================================== Declared Medications:  The flagging and interpretation on this report are based on the  following declared medications.  Unexpected results may arise from  inaccuracies in the declared medications.   **Note: The testing scope of this panel includes these medications:   Hydrocodone (Norco)   **Note: The testing scope of this panel does not include the  following reported medications:   Acetaminophen (Norco)  Albuterol (Ventolin HFA)  Atenolol (Tenormin)  Budesonide (Symbicort)  Duloxetine (Cymbalta)  Formoterol (Symbicort)  Furosemide  (Lasix)  Iron  Losartan (Cozaar)  Magnesium (Mag-Ox)  Meloxicam (Mobic)  Pantoprazole (Protonix)  Potassium (Klor-Con)  Pravastatin (Pravachol)  Pregabalin (Lyrica)  Spironolactone (Aldactone)  Turmeric  Vitamin C  Vitamin D3 ==================================================================== For clinical consultation, please call [phone]. ====================================================================      Laboratory Chemistry Profile   Renal Lab Results  Component Value Date   BUN 14 09/14/2021   CREATININE 1.02 (H) 09/14/2021   BCR 21 06/17/2019   GFRAA 53 (L) 06/17/2019   GFRNONAA 57 (L) 09/14/2021    Hepatic Lab Results  Component Value Date   AST 19 09/14/2021   ALT 11 09/14/2021   ALBUMIN 3.8 09/14/2021   ALKPHOS 66 09/14/2021   LIPASE 19 10/27/2015    Electrolytes Lab Results  Component Value Date   NA 140 09/14/2021   K 4.3 09/14/2021   CL 102 09/14/2021   CALCIUM 9.3 09/14/2021   MG 1.9 06/17/2019    Bone Lab Results  Component Value Date   25OHVITD1 45 06/17/2019   25OHVITD2 <1.0 06/17/2019   25OHVITD3 45 06/17/2019  Inflammation (CRP: Acute Phase) (ESR: Chronic Phase) Lab Results  Component Value Date   CRP 7 06/17/2019   ESRSEDRATE 49 (H) 06/17/2019   LATICACIDVEN 4.0 (H) 11/23/2009         Note: Above Lab results reviewed.  Imaging  DG PAIN CLINIC C-ARM 1-60 MIN NO REPORT Fluoro was used, but no Radiologist interpretation will be provided.  Please refer to "NOTES" tab for provider progress note.  Assessment  There were no encounter diagnoses.  Plan of Care  Problem-specific:  No problem-specific Assessment & Plan notes found for this encounter.  Ms. GARIMA CHRONIS has a current medication list which includes the following long-term medication(s): albuterol, atenolol, budesonide-formoterol, duloxetine, famotidine, ferosul, furosemide, hydrocodone-acetaminophen, [START ON 02/13/2022] hydrocodone-acetaminophen,  [START ON 03/15/2022] hydrocodone-acetaminophen, pantoprazole, potassium chloride sa, pravastatin, symbicort, and spironolactone.  Pharmacotherapy (Medications Ordered): No orders of the defined types were placed in this encounter.  Orders:  No orders of the defined types were placed in this encounter.  Follow-up plan:   No follow-ups on file.     Interventional Therapies  Risk   Complexity Considerations:   Estimated body mass index is 37.89 kg/m as calculated from the following:   Height as of this encounter: 5' (1.524 m).   Weight as of this encounter: 194 lb (88 kg). No RFA until patient brings BMI down to less than 30 kg/m   Planned   Pending:      Under consideration:   Possible bilateral lumbar facet RFA #1 (on hold until BMI is brought down to 30 kg/m)   Completed:   Diagnostic bilateral lumbar facet MBB x3 (01/18/2022) (5/10 to 0/10)    Therapeutic   Palliative (PRN) options:   Therapeutic/palliative bilateral lumbar facet MBB     Recent Visits Date Type Provider Dept  01/18/22 Procedure visit Milinda Pointer, MD [facility]  01/10/22 Office Visit Milinda Pointer, MD [facility]  Showing recent visits within past 90 days and meeting all other requirements Future Appointments Date Type Provider Dept  01/30/22 Office Visit Milinda Pointer, MD [facility]  03/29/22 Appointment Milinda Pointer, MD [facility]  Showing future appointments within next 90 days and meeting all other requirements  I discussed the assessment and treatment plan with the patient. The patient was provided an opportunity to ask questions and all were answered. The patient agreed with the plan and demonstrated an understanding of the instructions.  Patient advised to call back or seek an in-person evaluation if the symptoms or condition worsens.  Duration of encounter: *** minutes.  Note by: Gaspar Cola, MD Date: 01/30/2022; Time:  3:46 PM

## 2022-01-29 NOTE — Progress Notes (Signed)
Daily Session Note ? ?Patient Details  ?Name: Mariah Poole ?MRN: [id number] ?Date of Birth: 1946-01-04 ?Referring Provider:   ?Flowsheet Row Pulmonary Rehab from 12/18/2021 in [facility]  ?Referring Provider Dr. Christinia Gully  ? ?  ? ? ?Encounter Date: 01/29/2022 ? ?Check In: ? Session Check In - 01/29/22 1728   ? ?  ? Check-In  ? Supervising physician immediately available to respond to emergencies See telemetry face sheet for immediately available ER MD   ? Location [facility]-Cardiac & Pulmonary Rehab   ? Staff Present Renita Papa, RN BSN;Joseph Jackson, RCP,RRT,BSRT;Kara Tavares, [place], ASCM CEP, Exercise Physiologist   ? Virtual Visit No   ? Medication changes reported     No   ? Fall or balance concerns reported    No   ? Warm-up and Cool-down Performed on first and last piece of equipment   ? Resistance Training Performed Yes   ? VAD Patient? No   ? PAD/SET Patient? No   ?  ? Pain Assessment  ? Currently in Pain? No/denies   ? ?  ?  ? ?  ? ? ? ? ? ?Social History  ? ?Tobacco Use  ?Smoking Status Never  ?Smokeless Tobacco Never  ?Tobacco Comments  ? does not smoke   ? ? ?Goals Met:  ?Independence with exercise equipment ?Exercise tolerated well ?No report of concerns or symptoms today ?Strength training completed today ? ?Goals Unmet:  ?Not Applicable ? ?Comments: Pt able to follow exercise prescription today without complaint.  Will continue to monitor for progression. ? ? ? ?Dr. Emily Filbert is Medical Director for [place].  ?Dr. Ottie Glazier is Medical Director for [facility] Pulmonary Rehabilitation. ?

## 2022-01-30 ENCOUNTER — Ambulatory Visit: Payer: Medicare PPO | Attending: Pain Medicine | Admitting: Pain Medicine

## 2022-01-30 DIAGNOSIS — G894 Chronic pain syndrome: Secondary | ICD-10-CM

## 2022-01-30 DIAGNOSIS — M47816 Spondylosis without myelopathy or radiculopathy, lumbar region: Secondary | ICD-10-CM

## 2022-01-30 DIAGNOSIS — G8929 Other chronic pain: Secondary | ICD-10-CM | POA: Diagnosis not present

## 2022-01-30 DIAGNOSIS — M545 Low back pain, unspecified: Secondary | ICD-10-CM

## 2022-01-31 ENCOUNTER — Encounter: Payer: Medicare PPO | Admitting: *Deleted

## 2022-01-31 ENCOUNTER — Other Ambulatory Visit: Payer: Self-pay

## 2022-01-31 DIAGNOSIS — U099 Post covid-19 condition, unspecified: Secondary | ICD-10-CM

## 2022-01-31 DIAGNOSIS — R0609 Other forms of dyspnea: Secondary | ICD-10-CM | POA: Diagnosis not present

## 2022-01-31 NOTE — Progress Notes (Signed)
Daily Session Note ? ?Patient Details  ?Name: Mariah Poole ?MRN: [id number] ?Date of Birth: [birth year] ?Referring Provider:   ?Flowsheet Row Pulmonary Rehab from 12/18/2021 in [facility] Rehab  ?Referring Provider Dr. Christinia Gully  ? ?  ? ? ?Encounter Date: 01/31/2022 ? ?Check In: ? Session Check In - 01/31/22 1643   ? ?  ? Check-In  ? Supervising physician immediately available to respond to emergencies See telemetry face sheet for immediately available ER MD   ? Location [facility]   ? Staff Present Renita Papa, RN BSN;Joseph Hood, RCP,RRT,BSRT;Laureen [place], [place], RRT, CPFT   ? Virtual Visit No   ? Medication changes reported     No   ? Fall or balance concerns reported    No   ? Warm-up and Cool-down Performed on first and last piece of equipment   ? Resistance Training Performed Yes   ? VAD Patient? No   ? PAD/SET Patient? No   ?  ? Pain Assessment  ? Currently in Pain? No/denies   ? ?  ?  ? ?  ? ? ? ? ? ?Social History  ? ?Tobacco Use  ?Smoking Status Never  ?Smokeless Tobacco Never  ?Tobacco Comments  ? does not smoke   ? ? ?Goals Met:  ?Independence with exercise equipment ?Exercise tolerated well ?No report of concerns or symptoms today ?Strength training completed today ? ?Goals Unmet:  ?Not Applicable ? ?Comments: Pt able to follow exercise prescription today without complaint.  Will continue to monitor for progression. ? ? ? ?Dr. Emily Filbert is Medical Director for [place].  ?Dr. Ottie Glazier is Medical Director for [facility] Pulmonary Rehabilitation. ?

## 2022-02-01 ENCOUNTER — Encounter: Payer: Medicare PPO | Admitting: *Deleted

## 2022-02-01 DIAGNOSIS — R0609 Other forms of dyspnea: Secondary | ICD-10-CM | POA: Diagnosis not present

## 2022-02-01 NOTE — Progress Notes (Signed)
Daily Session Note ? ?Patient Details  ?Name: Mariah Poole ?MRN: [id number] ?Date of Birth: May 18, 1946 ?Referring Provider:   ?Flowsheet Row Pulmonary Rehab from 12/18/2021 in [facility] Rehab  ?Referring Provider Dr. Christinia Gully  ? ?  ? ? ?Encounter Date: 02/01/2022 ? ?Check In: ? Session Check In - 02/01/22 1730   ? ?  ? Check-In  ? Supervising physician immediately available to respond to emergencies See telemetry face sheet for immediately available ER MD   ? Location [facility] & Pul[facility] Re[facility]   ? Staff Present Renita Papa, RN BSN;Joseph [place], RCP,RRT,BSRT;Kara [place], [place], ASCM CEP, Exercise Physiologist   ? Virtual Visit No   ? Medication changes reported     No   ? Fall or balance concerns reported    No   ? Warm-up and Cool-down Performed on first and last piece of equipment   ? Resistance Training Performed Yes   ? VAD Patient? No   ? PAD/SET Patient? No   ?  ? Pain Assessment  ? Currently in Pain? No/denies   ? ?  ?  ? ?  ? ? ? ? ? ?Social History  ? ?Tobacco Use  ?Smoking Status Never  ?Smokeless Tobacco Never  ?Tobacco Comments  ? does not smoke   ? ? ?Goals Met:  ?Independence with exercise equipment ?Exercise tolerated well ?No report of concerns or symptoms today ?Strength training completed today ? ?Goals Unmet:  ?Not Applicable ? ?Comments: Pt able to follow exercise prescription today without complaint.  Will continue to monitor for progression. ? ? ? ?Dr. Emily Filbert is Medical Director for [place].  ?Dr. Ottie Glazier is Medical Director for [facility] Pulmonary Rehabilitation. ?

## 2022-02-05 ENCOUNTER — Encounter: Payer: Medicare PPO | Admitting: *Deleted

## 2022-02-05 ENCOUNTER — Other Ambulatory Visit: Payer: Self-pay

## 2022-02-05 DIAGNOSIS — R0609 Other forms of dyspnea: Secondary | ICD-10-CM | POA: Diagnosis not present

## 2022-02-05 DIAGNOSIS — U099 Post covid-19 condition, unspecified: Secondary | ICD-10-CM

## 2022-02-05 NOTE — Progress Notes (Signed)
Daily Session Note ? ?Patient Details  ?Name: Mariah Poole ?MRN: [id number] ?Date of Birth: 04/24/1946 ?Referring Provider:   ?Flowsheet Row Pulmonary Rehab from 12/18/2021 in [facility] Cardiac and Pulmonary Rehab  ?Referring Provider Dr. Michael Wert  ? ?  ? ? ?Encounter Date: 02/05/2022 ? ?Check In: ? Session Check In - 02/05/22 1724   ? ?  ? Check-In  ? Supervising physician immediately available to respond to emergencies See telemetry face sheet for immediately available ER MD   ? Location [facility] & Pulmonary Re[facility]   ? Staff Present Meredith Craven, RN BSN;Joseph Hood, RCP,RRT,BSRT;Kara [place], MS, ASCM CEP, Exercise Physiologist   ? Virtual Visit No   ? Medication changes reported     No   ? Fall or balance concerns reported    No   ? Warm-up and Cool-down Performed on first and last piece of equipment   ? Resistance Training Performed Yes   ? VAD Patient? No   ? PAD/SET Patient? No   ?  ? Pain Assessment  ? Currently in Pain? No/denies   ? ?  ?  ? ?  ? ? ? ? ? ?Social History  ? ?Tobacco Use  ?Smoking Status Never  ?Smokeless Tobacco Never  ?Tobacco Comments  ? does not smoke   ? ? ?Goals Met:  ?Independence with exercise equipment ?Exercise tolerated well ?No report of concerns or symptoms today ?Strength training completed today ? ?Goals Unmet:  ?Not Applicable ? ?Comments: Pt able to follow exercise prescription today without complaint.  Will continue to monitor for progression. ? ? ? ?Dr. Mark Miller is Medical Director for [facility].  ?Dr. Fuad Aleskerov is Medical Director for [facility] Pulmonary Rehabilitation. ?

## 2022-02-07 ENCOUNTER — Other Ambulatory Visit: Payer: Self-pay

## 2022-02-07 ENCOUNTER — Encounter: Payer: Self-pay | Admitting: *Deleted

## 2022-02-07 DIAGNOSIS — U099 Post covid-19 condition, unspecified: Secondary | ICD-10-CM

## 2022-02-07 DIAGNOSIS — R0609 Other forms of dyspnea: Secondary | ICD-10-CM | POA: Diagnosis not present

## 2022-02-07 NOTE — Progress Notes (Signed)
Pulmonary Individual Treatment Plan ? ?Patient Details  ?Name: Mariah Poole ?MRN: [id number] ?Date of Birth: Nov 23, 1945 ?Referring Provider:   ?Flowsheet Row Pulmonary Rehab from 12/18/2021 in [facility] Cardiac and Pulmonary Rehab  ?Referring Provider Dr. Sandrea Hughs  ? ?  ? ? ?Initial Encounter Date:  ?Flowsheet Row Pulmonary Rehab from 12/18/2021 in [facility] Cardiac and Pulmonary Rehab  ?Date 12/18/21  ? ?  ? ? ?Visit Diagnosis: Post-COVID chronic dyspnea ? ?Patient's Home Medications on Admission: ? ?Current Outpatient Medications:  ?  albuterol (PROVENTIL HFA;VENTOLIN HFA) 108 (90 BASE) MCG/ACT inhaler, Inhale 2 puffs into the lungs every 4 (four) hours as needed for wheezing or shortness of breath., Disp: , Rfl:  ?  atenolol (TENORMIN) 25 MG tablet, Take 25 mg by mouth daily. , Disp: , Rfl:  ?  budesonide-formoterol (SYMBICORT) 160-4.5 MCG/ACT inhaler, Inhale into the lungs., Disp: , Rfl:  ?  Cholecalciferol (VITAMIN D3) 2000 UNITS TABS, Take 1 tablet by mouth daily., Disp: , Rfl:  ?  DULoxetine (CYMBALTA) 60 MG capsule, Take 60 mg by mouth daily., Disp: , Rfl:  ?  famotidine (PEPCID) 20 MG tablet, One after supper, Disp: 30 tablet, Rfl: 11 ?  FEROSUL 325 (65 Fe) MG tablet, Take 325 mg by mouth daily., Disp: , Rfl:  ?  furosemide (LASIX) 40 MG tablet, Take 40 mg by mouth 2 (two) times daily., Disp: , Rfl:  ?  HYDROcodone-acetaminophen (NORCO) 10-325 MG tablet, Take 1 tablet by mouth every 8 (eight) hours. Must last 30 days, Disp: 90 tablet, Rfl: 0 ?  [START ON 02/13/2022] HYDROcodone-acetaminophen (NORCO) 10-325 MG tablet, Take 1 tablet by mouth every 8 (eight) hours. Must last 30 days, Disp: 90 tablet, Rfl: 0 ?  [START ON 03/15/2022] HYDROcodone-acetaminophen (NORCO) 10-325 MG tablet, Take 1 tablet by mouth every 8 (eight) hours. Must last 30 days, Disp: 90 tablet, Rfl: 0 ?  losartan (COZAAR) 25 MG tablet, Take 25 mg by mouth daily., Disp: , Rfl:  ?  pantoprazole (PROTONIX) 40 MG tablet, Take 1 tablet (40 mg total) by  mouth daily., Disp: 30 tablet, Rfl: 2 ?  potassium chloride SA (K-DUR,KLOR-CON) 20 MEQ tablet, Take 20 mEq by mouth daily. , Disp: , Rfl:  ?  pravastatin (PRAVACHOL) 20 MG tablet, Take 20 mg by mouth at bedtime. Reported on 12/13/2015, Disp: , Rfl:  ?  spironolactone (ALDACTONE) 25 MG tablet, Take 12.5 mg by mouth daily., Disp: , Rfl:  ?  SYMBICORT 160-4.5 MCG/ACT inhaler, Take 2 puffs first thing in am and then another 2 puffs about 12 hours later., Disp: 1 each, Rfl: 11 ?  Turmeric Curcumin 500 MG CAPS, Take by mouth daily. , Disp: , Rfl:  ?  vitamin C (ASCORBIC ACID) 500 MG tablet, Take 500 mg by mouth daily., Disp: , Rfl:  ? ?Past Medical History: ?Past Medical History:  ?Diagnosis Date  ? Anemia   ? Arthritis   ? COPD (chronic obstructive pulmonary disease) (HCC)   ? Depression   ? Diabetes mellitus without complication (HCC)   ? GERD (gastroesophageal reflux disease)   ? Hiatal hernia   ? History of contrast media allergy. (IVP dye) 09/01/2015  ? History of hiatal hernia   ? Hypercholesteremia   ? Hypertension   ? Morbid obesity (HCC)   ? Scoliosis   ? Shortness of breath dyspnea   ? with exertion  ? ? ?Tobacco Use: ?Social History  ? ?Tobacco Use  ?Smoking Status Never  ?Smokeless Tobacco Never  ?Tobacco Comments  ?  does not smoke   ? ? ?Labs: ?Review Flowsheet   ? ?  ?  Latest Ref Rng & Units 11/23/2009 05/20/2017  ?Labs for [facility] Cardiac and Pulmonary Rehab  ?Hemoglobin A1c 4.8 - 5.6 %  6.1    ?TCO2 0 - 100 mmol/L 29     ?  ? ? Multiple values from one day are sorted in reverse-chronological order  ?  ?  ? ? ? ?Pulmonary Assessment Scores: ? Pulmonary Assessment Scores   ? ? Row Name 12/18/21 1601  ?  ?  ?  ? ADL UCSD  ? SOB Score total 59    ? Rest 0    ? Walk 1    ? Stairs 3    ? Bath 1    ? Dress 2    ? Shop 3    ?  ? CAT Score  ? CAT Score 10    ?  ? mMRC Score  ? mMRC Score 2    ? ?  ?  ? ?  ?  ?UCSD: ?Self-administered rating of dyspnea associated with activities of daily living (ADLs) ?6-point scale (0 =  "not at all" to 5 = "maximal or unable to do because of breathlessness")  ?Scoring Scores range from 0 to 120.  Minimally important difference is 5 units ? ?CAT: ?CAT can identify the health impairment of COPD patients and is better correlated with disease progression.  ?CAT has a scoring range of zero to 40. The CAT score is classified into four groups of low (less than 10), medium (10 - 20), high (21-30) and very high (31-40) based on the impact level of disease on health status. A CAT score over 10 suggests significant symptoms.  A worsening CAT score could be explained by an exacerbation, poor medication adherence, poor inhaler technique, or progression of COPD or comorbid conditions.  ?CAT MCID is 2 points ? ?mMRC: ?mMRC (Modified Medical Research Council) Dyspnea Scale is used to assess the degree of baseline functional disability in patients of respiratory disease due to dyspnea. ?No minimal important difference is established. A decrease in score of 1 point or greater is considered a positive change.  ? ?Pulmonary Function Assessment: ? Pulmonary Function Assessment - 11/23/21 0908   ? ?  ? Breath  ? Shortness of Breath Yes;Limiting activity   ? ?  ?  ? ?  ? ? ?Exercise Target Goals: ?Exercise Program Goal: ?Individual exercise prescription set using results from initial 6 min walk test and THRR while considering  patient?s activity barriers and safety.  ? ?Exercise Prescription Goal: ?Initial exercise prescription builds to 30-45 minutes a day of aerobic activity, 2-3 days per week.  Home exercise guidelines will be given to patient during program as part of exercise prescription that the participant will acknowledge. ? ?Education: Aerobic Exercise: ?- Group verbal and visual presentation on the components of exercise prescription. Introduces F.I.T.T principle from ACSM for exercise prescriptions.  Reviews F.I.T.T. principles of aerobic exercise including progression. Written material given at  graduation. ?Flowsheet Row Pulmonary Rehab from 01/31/2022 in [facility] Cardiac and Pulmonary Rehab  ?Date 01/17/22  ?Educator AS  ?Instruction Review Code 1- Verbalizes Understanding  ? ?  ? ? ?Education: Resistance Exercise: ?- Group verbal and visual presentation on the components of exercise prescription. Introduces F.I.T.T principle from ACSM for exercise prescriptions  Reviews F.I.T.T. principles of resistance exercise including progression. Written material given at graduation. ?Flowsheet Row Pulmonary Rehab from 01/31/2022 in [facility] Cardiac and Pulmonary  Rehab  ?Date 01/24/22  ?Educator AS  ?Instruction Review Code 1- Verbalizes Understanding  ? ?  ? ?  ?Education: Exercise & Equipment Safety: ?- Individual verbal instruction and demonstration of equipment use and safety with use of the equipment. ?Flowsheet Row Pulmonary Rehab from 01/31/2022 in [facility] Cardiac and Pulmonary Rehab  ?Date 12/18/21  ?Educator KH  ?Instruction Review Code 1- Verbalizes Understanding  ? ?  ? ? ?Education: Exercise Physiology & General Exercise Guidelines: ?- Group verbal and written instruction with models to review the exercise physiology of the cardiovascular system and associated critical values. Provides general exercise guidelines with specific guidelines to those with heart or lung disease.  ?Flowsheet Row Pulmonary Rehab from 01/31/2022 in [facility] Cardiac and Pulmonary Rehab  ?Date 01/10/22  ?Educator AS  ?Instruction Review Code 1- Verbalizes Understanding  ? ?  ? ? ?Education: Flexibility, Balance, Mind/Body Relaxation: ?- Group verbal and visual presentation with interactive activity on the components of exercise prescription. Introduces F.I.T.T principle from ACSM for exercise prescriptions. Reviews F.I.T.T. principles of flexibility and balance exercise training including progression. Also discusses the mind body connection.  Reviews various relaxation techniques to help reduce and manage stress (i.e. Deep breathing, progressive  muscle relaxation, and visualization). Balance handout provided to take home. Written material given at graduation. ? ? ?Activity Barriers & Risk Stratification: ? ? ?6 Minute Walk: ? 6 Minute Walk   ? ? Row Name 12/18/21

## 2022-02-07 NOTE — Progress Notes (Signed)
Daily Session Note ? ?Patient Details  ?Name: Mariah Poole ?MRN: [id number] ?Date of Birth: [birth year] ?Referring Provider:   ?Flowsheet Row Pulmonary Rehab from 12/18/2021 in [facility] Cardiac and Pulmonary Rehab  ?Referring Provider Dr. Christinia Gully  ? ?  ? ? ?Encounter Date: 02/07/2022 ? ?Check In: ? Session Check In - 02/07/22 1702   ? ?  ? Check-In  ? Supervising physician immediately available to respond to emergencies See telemetry face sheet for immediately available ER MD   ? Location [facility]-Cardiac & Pulmonary Rehab   ? Staff Present Birdie Sons, MPA, RN;Joseph [place], RCP,RRT,BSRT;Kara [place], MS, ASCM CEP, Exercise Physiologist   ? Virtual Visit No   ? Medication changes reported     No   ? Fall or balance concerns reported    No   ? Warm-up and Cool-down Performed on first and last piece of equipment   ? Resistance Training Performed Yes   ? VAD Patient? No   ? PAD/SET Patient? No   ?  ? Pain Assessment  ? Currently in Pain? No/denies   ? ?  ?  ? ?  ? ? ? ? ? ?Social History  ? ?Tobacco Use  ?Smoking Status Never  ?Smokeless Tobacco Never  ?Tobacco Comments  ? does not smoke   ? ? ?Goals Met:  ?Independence with exercise equipment ?Exercise tolerated well ?No report of concerns or symptoms today ?Strength training completed today ? ?Goals Unmet:  ?Not Applicable ? ?Comments: Pt able to follow exercise prescription today without complaint.  Will continue to monitor for progression. ? ? ? ?Dr. Emily Filbert is Medical Director for [place].  ?Dr. Ottie Glazier is Medical Director for [facility] Pulmonary Rehabilitation. ?

## 2022-02-08 ENCOUNTER — Encounter: Payer: Medicare PPO | Admitting: *Deleted

## 2022-02-08 DIAGNOSIS — R0609 Other forms of dyspnea: Secondary | ICD-10-CM

## 2022-02-08 NOTE — Progress Notes (Signed)
Daily Session Note ? ?Patient Details  ?Name: Mariah Poole ?MRN: [id number] ?Date of Birth: 08/05/1946 ?Referring Provider:   ?Flowsheet Row Pulmonary Rehab from 12/18/2021 in [facility] Cardiac and Pulmonary Rehab  ?Referring Provider Dr. Christinia Gully  ? ?  ? ? ?Encounter Date: 02/08/2022 ? ?Check In: ? Session Check In - 02/08/22 1722   ? ?  ? Check-In  ? Supervising physician immediately available to respond to emergencies See telemetry face sheet for immediately available ER MD   ? Location [facility]-Cardiac & Pulmonary Rehab   ? Staff Present Renita Papa, RN BSN;Joseph [place], RCP,RRT,BSRT;Kara [place], [place], ASCM CEP, Exercise Physiologist   ? Virtual Visit No   ? Medication changes reported     No   ? Fall or balance concerns reported    No   ? Warm-up and Cool-down Performed on first and last piece of equipment   ? Resistance Training Performed Yes   ? VAD Patient? No   ? PAD/SET Patient? No   ?  ? Pain Assessment  ? Currently in Pain? No/denies   ? ?  ?  ? ?  ? ? ? ? ? ?Social History  ? ?Tobacco Use  ?Smoking Status Never  ?Smokeless Tobacco Never  ?Tobacco Comments  ? does not smoke   ? ? ?Goals Met:  ?Independence with exercise equipment ?Exercise tolerated well ?No report of concerns or symptoms today ?Strength training completed today ? ?Goals Unmet:  ?Not Applicable ? ?Comments: Pt able to follow exercise prescription today without complaint.  Will continue to monitor for progression. ? ? ? ?Dr. Emily Filbert is Medical Director for [place].  ?Dr. Ottie Glazier is Medical Director for [facility] Pulmonary Rehabilitation. ?

## 2022-02-12 ENCOUNTER — Encounter: Payer: Medicare PPO | Admitting: *Deleted

## 2022-02-12 ENCOUNTER — Other Ambulatory Visit: Payer: Self-pay

## 2022-02-12 DIAGNOSIS — R0609 Other forms of dyspnea: Secondary | ICD-10-CM | POA: Diagnosis not present

## 2022-02-12 DIAGNOSIS — U099 Post covid-19 condition, unspecified: Secondary | ICD-10-CM

## 2022-02-12 NOTE — Progress Notes (Signed)
Daily Session Note ? ?Patient Details  ?Name: Mariah Poole ?MRN: [id number] ?Date of Birth: 23-Mar-1946 ?Referring Provider:   ?Flowsheet Row Pulmonary Rehab from 12/18/2021 in [facility] Cardiac and Pulmonary Rehab  ?Referring Provider Dr. Christinia Gully  ? ?  ? ? ?Encounter Date: 02/12/2022 ? ?Check In: ? Session Check In - 02/12/22 1715   ? ?  ? Check-In  ? Supervising physician immediately available to respond to emergencies See telemetry face sheet for immediately available ER MD   ? Location [facility]-Cardiac & Pulmonary Rehab   ? Staff Present Renita Papa, RN BSN;Joseph [place], RCP,RRT,BSRT;Kara [place], [place], ASCM CEP, Exercise Physiologist   ? Virtual Visit No   ? Medication changes reported     No   ? Fall or balance concerns reported    No   ? Warm-up and Cool-down Performed on first and last piece of equipment   ? Resistance Training Performed Yes   ? VAD Patient? No   ? PAD/SET Patient? No   ?  ? Pain Assessment  ? Currently in Pain? No/denies   ? ?  ?  ? ?  ? ? ? ? ? ?Social History  ? ?Tobacco Use  ?Smoking Status Never  ?Smokeless Tobacco Never  ?Tobacco Comments  ? does not smoke   ? ? ?Goals Met:  ?Independence with exercise equipment ?Exercise tolerated well ?No report of concerns or symptoms today ?Strength training completed today ? ?Goals Unmet:  ?Not Applicable ? ?Comments: Pt able to follow exercise prescription today without complaint.  Will continue to monitor for progression. ? ? ? ?Dr. Emily Filbert is Medical Director for [place].  ?Dr. Ottie Glazier is Medical Director for [facility] Pulmonary Rehabilitation. ?

## 2022-02-14 DIAGNOSIS — R0609 Other forms of dyspnea: Secondary | ICD-10-CM

## 2022-02-14 NOTE — Progress Notes (Signed)
Daily Session Note ? ?Patient Details  ?Name: Mariah Poole ?MRN: [id number] ?Date of Birth: 05-06-1946 ?Referring Provider:   ?Flowsheet Row Pulmonary Rehab from 12/18/2021 in [facility] Cardiac and Pulmonary Rehab  ?Referring Provider Dr. Christinia Gully  ? ?  ? ? ?Encounter Date: 02/14/2022 ? ?Check In: ? Session Check In - 02/14/22 1619   ? ?  ? Check-In  ? Supervising physician immediately available to respond to emergencies See telemetry face sheet for immediately available ER MD   ? Location [facility]-Cardiac & Pulmonary Rehab   ? Staff Present Birdie Sons, MPA, RN;Joseph [place], RCP,RRT,BSRT;Kara [place], MS, ASCM CEP, Exercise Physiologist   ? Virtual Visit No   ? Medication changes reported     No   ? Fall or balance concerns reported    No   ? Warm-up and Cool-down Performed on first and last piece of equipment   ? Resistance Training Performed Yes   ? VAD Patient? No   ? PAD/SET Patient? No   ?  ? Pain Assessment  ? Currently in Pain? No/denies   ? ?  ?  ? ?  ? ? ? ? ? ?Social History  ? ?Tobacco Use  ?Smoking Status Never  ?Smokeless Tobacco Never  ?Tobacco Comments  ? does not smoke   ? ? ?Goals Met:  ?Independence with exercise equipment ?Exercise tolerated well ?No report of concerns or symptoms today ?Strength training completed today ? ?Goals Unmet:  ?Not Applicable ? ?Comments: Pt able to follow exercise prescription today without complaint.  Will continue to monitor for progression. ? ? ? ?Dr. Emily Filbert is Medical Director for [place].  ?Dr. Ottie Glazier is Medical Director for [facility] Pulmonary Rehabilitation. ?

## 2022-02-14 NOTE — Progress Notes (Signed)
Completed initial RD consultation ?

## 2022-02-15 ENCOUNTER — Encounter: Payer: Medicare PPO | Admitting: *Deleted

## 2022-02-15 DIAGNOSIS — R0609 Other forms of dyspnea: Secondary | ICD-10-CM | POA: Diagnosis not present

## 2022-02-15 DIAGNOSIS — U099 Post covid-19 condition, unspecified: Secondary | ICD-10-CM

## 2022-02-15 NOTE — Progress Notes (Signed)
Daily Session Note ? ?Patient Details  ?Name: Mariah Poole ?MRN: [id number] ?Date of Birth: 03/21/1946 ?Referring Provider:   ?Flowsheet Row Pulmonary Rehab from 12/18/2021 in [facility] Cardiac and Pulmonary Rehab  ?Referring Provider Dr. Christinia Gully  ? ?  ? ? ?Encounter Date: 02/15/2022 ? ?Check In: ? Session Check In - 02/15/22 1719   ? ?  ? Check-In  ? Supervising physician immediately available to respond to emergencies See telemetry face sheet for immediately available ER MD   ? Location [facility]-Cardiac & Pulmonary Rehab   ? Staff Present Renita Papa, RN BSN;Joseph [place], RCP,RRT,BSRT;Jessica [place], [place], [place], [place], CCET   ? Virtual Visit No   ? Medication changes reported     No   ? Fall or balance concerns reported    No   ? Warm-up and Cool-down Performed on first and last piece of equipment   ? Resistance Training Performed Yes   ? VAD Patient? No   ? PAD/SET Patient? No   ?  ? Pain Assessment  ? Currently in Pain? No/denies   ? ?  ?  ? ?  ? ? ? ? ? ?Social History  ? ?Tobacco Use  ?Smoking Status Never  ?Smokeless Tobacco Never  ?Tobacco Comments  ? does not smoke   ? ? ?Goals Met:  ?Independence with exercise equipment ?Exercise tolerated well ?No report of concerns or symptoms today ?Strength training completed today ? ?Goals Unmet:  ?Not Applicable ? ?Comments: Pt able to follow exercise prescription today without complaint.  Will continue to monitor for progression. ? ? ? ?Dr. Emily Filbert is Medical Director for [place].  ?Dr. Ottie Glazier is Medical Director for [facility] Pulmonary Rehabilitation. ?

## 2022-02-19 ENCOUNTER — Encounter: Payer: Medicare PPO | Attending: Internal Medicine | Admitting: *Deleted

## 2022-02-19 DIAGNOSIS — R0609 Other forms of dyspnea: Secondary | ICD-10-CM | POA: Insufficient documentation

## 2022-02-19 DIAGNOSIS — U099 Post covid-19 condition, unspecified: Secondary | ICD-10-CM

## 2022-02-19 DIAGNOSIS — J45909 Unspecified asthma, uncomplicated: Secondary | ICD-10-CM | POA: Diagnosis not present

## 2022-02-19 NOTE — Progress Notes (Signed)
Daily Session Note ? ?Patient Details  ?Name: Mariah Poole ?MRN: [id number] ?Date of Birth: 05-May-1946 ?Referring Provider:   ?Flowsheet Row Pulmonary Rehab from 12/18/2021 in [facility] Cardiac and Pulmonary Rehab  ?Referring Provider Dr. Christinia Gully  ? ?  ? ? ?Encounter Date: 02/19/2022 ? ?Check In: ? Session Check In - 02/19/22 1721   ? ?  ? Check-In  ? Supervising physician immediately available to respond to emergencies See telemetry face sheet for immediately available ER MD   ? Location [facility]-Cardiac & Pulmonary Rehab   ? Staff Present Renita Papa, RN BSN;Joseph [place], RCP,RRT,BSRT;Melissa [place], [place], LDN   ? Virtual Visit No   ? Medication changes reported     No   ? Fall or balance concerns reported    No   ? Warm-up and Cool-down Performed on first and last piece of equipment   ? Resistance Training Performed Yes   ? VAD Patient? No   ? PAD/SET Patient? No   ?  ? Pain Assessment  ? Currently in Pain? No/denies   ? ?  ?  ? ?  ? ? ? ? ? ?Social History  ? ?Tobacco Use  ?Smoking Status Never  ?Smokeless Tobacco Never  ?Tobacco Comments  ? does not smoke   ? ? ?Goals Met:  ?Independence with exercise equipment ?Exercise tolerated well ?No report of concerns or symptoms today ?Strength training completed today ? ?Goals Unmet:  ?Not Applicable ? ?Comments: Pt able to follow exercise prescription today without complaint.  Will continue to monitor for progression. ? ? ? ?Dr. Emily Filbert is Medical Director for [place].  ?Dr. Ottie Glazier is Medical Director for [facility] Pulmonary Rehabilitation. ?

## 2022-02-21 DIAGNOSIS — R0609 Other forms of dyspnea: Secondary | ICD-10-CM | POA: Diagnosis not present

## 2022-02-21 NOTE — Progress Notes (Signed)
Daily Session Note ? ?Patient Details  ?Name: Mariah Poole ?MRN: [id number] ?Date of Birth: 15-Aug-1946 ?Referring Provider:   ?Flowsheet Row Pulmonary Rehab from 12/18/2021 in [facility] Cardiac and Pulmonary Rehab  ?Referring Provider Dr. Christinia Gully  ? ?  ? ? ?Encounter Date: 02/21/2022 ? ?Check In: ? Session Check In - 02/21/22 1639   ? ?  ? Check-In  ? Supervising physician immediately available to respond to emergencies See telemetry face sheet for immediately available ER MD   ? Location [facility]-Cardiac & Pulmonary Rehab   ? Staff Present Birdie Sons, MPA, RN;Joseph [place], [place], MS, ASCM CEP, Exercise Physiologist;Melissa [place], RDN, LDN   ? Virtual Visit No   ? Medication changes reported     No   ? Fall or balance concerns reported    No   ? Warm-up and Cool-down Performed on first and last piece of equipment   ? Resistance Training Performed Yes   ? VAD Patient? No   ? PAD/SET Patient? No   ?  ? Pain Assessment  ? Currently in Pain? No/denies   ? ?  ?  ? ?  ? ? ? ? ? ?Social History  ? ?Tobacco Use  ?Smoking Status Never  ?Smokeless Tobacco Never  ?Tobacco Comments  ? does not smoke   ? ? ?Goals Met:  ?Independence with exercise equipment ?Exercise tolerated well ?No report of concerns or symptoms today ?Strength training completed today ? ?Goals Unmet:  ?Not Applicable ? ?Comments: Pt able to follow exercise prescription today without complaint.  Will continue to monitor for progression. ? ? ? ?Dr. Emily Filbert is Medical Director for [place].  ?Dr. Ottie Glazier is Medical Director for [facility] Pulmonary Rehabilitation. ?

## 2022-02-26 ENCOUNTER — Encounter: Payer: Medicare PPO | Admitting: *Deleted

## 2022-02-26 DIAGNOSIS — R0609 Other forms of dyspnea: Secondary | ICD-10-CM | POA: Diagnosis not present

## 2022-02-26 NOTE — Progress Notes (Signed)
Daily Session Note ? ?Patient Details  ?Name: Mariah Poole ?MRN: [id number] ?Date of Birth: 05-Aug-1946 ?Referring Provider:   ?Flowsheet Row Pulmonary Rehab from 12/18/2021 in [facility] Cardiac and Pulmonary Rehab  ?Referring Provider Dr. Christinia Gully  ? ?  ? ? ?Encounter Date: 02/26/2022 ? ?Check In: ? Session Check In - 02/26/22 1716   ? ?  ? Check-In  ? Supervising physician immediately available to respond to emergencies See telemetry face sheet for immediately available ER MD   ? Location [facility]-Cardiac & Pulmonary Rehab   ? Staff Present Renita Papa, RN BSN;Joseph [place], RCP,RRT,BSRT;Melissa [place], [place], LDN   ? Virtual Visit No   ? Medication changes reported     No   ? Fall or balance concerns reported    No   ? Warm-up and Cool-down Performed on first and last piece of equipment   ? Resistance Training Performed Yes   ? VAD Patient? No   ? PAD/SET Patient? No   ?  ? Pain Assessment  ? Currently in Pain? No/denies   ? ?  ?  ? ?  ? ? ? ? ? ?Social History  ? ?Tobacco Use  ?Smoking Status Never  ?Smokeless Tobacco Never  ?Tobacco Comments  ? does not smoke   ? ? ?Goals Met:  ?Independence with exercise equipment ?Exercise tolerated well ?No report of concerns or symptoms today ?Strength training completed today ? ?Goals Unmet:  ?Not Applicable ? ?Comments: Pt able to follow exercise prescription today without complaint.  Will continue to monitor for progression. ? ? ? ?Dr. Emily Filbert is Medical Director for [place].  ?Dr. Ottie Glazier is Medical Director for [facility] Pulmonary Rehabilitation. ?

## 2022-02-28 ENCOUNTER — Encounter: Payer: Medicare PPO | Admitting: *Deleted

## 2022-02-28 DIAGNOSIS — U099 Post covid-19 condition, unspecified: Secondary | ICD-10-CM

## 2022-02-28 DIAGNOSIS — R0609 Other forms of dyspnea: Secondary | ICD-10-CM | POA: Diagnosis not present

## 2022-02-28 NOTE — Progress Notes (Signed)
Daily Session Note ? ?Patient Details  ?Name: Mariah Poole ?MRN: [id number] ?Date of Birth: [birth year] ?Referring Provider:   ?Flowsheet Row Pulmonary Rehab from 12/18/2021 in [facility] Cardiac and Pulmonary Rehab  ?Referring Provider Dr. Christinia Gully  ? ?  ? ? ?Encounter Date: 02/28/2022 ? ?Check In: ? Session Check In - 02/28/22 1734   ? ?  ? Check-In  ? Supervising physician immediately available to respond to emergencies See telemetry face sheet for immediately available ER MD   ? Location [facility]-Cardiac & Pulmonary Rehab   ? Staff Present Renita Papa, RN BSN   ? Virtual Visit No   ? Medication changes reported     No   ? Fall or balance concerns reported    No   ? Warm-up and Cool-down Performed on first and last piece of equipment   ? Resistance Training Performed Yes   ? VAD Patient? No   ? PAD/SET Patient? No   ?  ? Pain Assessment  ? Currently in Pain? No/denies   ? ?  ?  ? ?  ? ? ? ? ? ?Social History  ? ?Tobacco Use  ?Smoking Status Never  ?Smokeless Tobacco Never  ?Tobacco Comments  ? does not smoke   ? ? ?Goals Met:  ?Independence with exercise equipment ?Exercise tolerated well ?No report of concerns or symptoms today ?Strength training completed today ? ?Goals Unmet:  ?Not Applicable ? ?Comments: Pt able to follow exercise prescription today without complaint.  Will continue to monitor for progression. ? ? ? ?Dr. Emily Filbert is Medical Director for [place].  ?Dr. Ottie Glazier is Medical Director for [facility] Pulmonary Rehabilitation. ?

## 2022-03-01 ENCOUNTER — Encounter: Payer: Medicare PPO | Admitting: *Deleted

## 2022-03-01 DIAGNOSIS — R0609 Other forms of dyspnea: Secondary | ICD-10-CM

## 2022-03-01 NOTE — Progress Notes (Signed)
Daily Session Note ? ?Patient Details  ?Name: Mariah Poole ?MRN: [id number] ?Date of Birth: [birth year] ?Referring Provider:   ?Flowsheet Row Pulmonary Rehab from 12/18/2021 in [facility] Cardiac and Pulmonary Rehab  ?Referring Provider Dr. Christinia Gully  ? ?  ? ? ?Encounter Date: 03/01/2022 ? ?Check In: ? Session Check In - 03/01/22 1720   ? ?  ? Check-In  ? Supervising physician immediately available to respond to emergencies See telemetry face sheet for immediately available ER MD   ? Location [facility]-Cardiac & Pulmonary Rehab   ? Staff Present Renita Papa, RN BSN;Laureen Owens Shark, BS, RRT, CPFT;Joseph [place], [place]   ? Virtual Visit No   ? Medication changes reported     No   ? Fall or balance concerns reported    No   ? Warm-up and Cool-down Performed on first and last piece of equipment   ? Resistance Training Performed Yes   ? VAD Patient? No   ? PAD/SET Patient? No   ?  ? Pain Assessment  ? Currently in Pain? No/denies   ? ?  ?  ? ?  ? ? ? ? ? ?Social History  ? ?Tobacco Use  ?Smoking Status Never  ?Smokeless Tobacco Never  ?Tobacco Comments  ? does not smoke   ? ? ?Goals Met:  ?Independence with exercise equipment ?Exercise tolerated well ?No report of concerns or symptoms today ?Strength training completed today ? ?Goals Unmet:  ?Not Applicable ? ?Comments: Pt able to follow exercise prescription today without complaint.  Will continue to monitor for progression. ? ? ? ?Dr. Emily Filbert is Medical Director for [place].  ?Dr. Ottie Glazier is Medical Director for [facility] Pulmonary Rehabilitation. ?

## 2022-03-05 ENCOUNTER — Encounter: Payer: Medicare PPO | Admitting: *Deleted

## 2022-03-05 DIAGNOSIS — R0609 Other forms of dyspnea: Secondary | ICD-10-CM | POA: Diagnosis not present

## 2022-03-05 NOTE — Progress Notes (Signed)
Daily Session Note ? ?Patient Details  ?Name: Mychal Durio Heacox ?MRN: [id number] ?Date of Birth: September 12, 1946 ?Referring Provider:   ?Flowsheet Row Pulmonary Rehab from 12/18/2021 in [facility] Cardiac and Pulmonary Rehab  ?Referring Provider Dr. Christinia Gully  ? ?  ? ? ?Encounter Date: 03/05/2022 ? ?Check In: ? Session Check In - 03/05/22 1743   ? ?  ? Check-In  ? Supervising physician immediately available to respond to emergencies See telemetry face sheet for immediately available ER MD   ? Location [facility]-Cardiac & Pulmonary Rehab   ? Staff Present Hope Budds, RDN, LDN;Zimir Kittleson Sherryll Burger, RN BSN;Joseph Hood, RCP,RRT,BSRT   ? Virtual Visit No   ? Medication changes reported     No   ? Fall or balance concerns reported    No   ? Warm-up and Cool-down Performed on first and last piece of equipment   ? Resistance Training Performed Yes   ? VAD Patient? No   ? PAD/SET Patient? No   ? ?  ?  ? ?  ? ? ? ? ? ?Social History  ? ?Tobacco Use  ?Smoking Status Never  ?Smokeless Tobacco Never  ?Tobacco Comments  ? does not smoke   ? ? ?Goals Met:  ?Independence with exercise equipment ?Exercise tolerated well ?No report of concerns or symptoms today ?Strength training completed today ? ?Goals Unmet:  ?Not Applicable ? ?Comments: Pt able to follow exercise prescription today without complaint.  Will continue to monitor for progression. ? ? ? ?Dr. Emily Filbert is Medical Director for [place].  ?Dr. Ottie Glazier is Medical Director for [facility] Pulmonary Rehabilitation. ?

## 2022-03-07 ENCOUNTER — Encounter: Payer: Self-pay | Admitting: *Deleted

## 2022-03-07 DIAGNOSIS — R0609 Other forms of dyspnea: Secondary | ICD-10-CM | POA: Diagnosis not present

## 2022-03-07 NOTE — Progress Notes (Signed)
Pulmonary Individual Treatment Plan ? ?Patient Details  ?Name: Mariah Poole ?MRN: [id number] ?Date of Birth: 18-Apr-1946 ?Referring Provider:   ?Flowsheet Row Pulmonary Rehab from 12/18/2021 in [facility] Cardiac and Pulmonary Rehab  ?Referring Provider Dr. Sandrea Hughs  ? ?  ? ? ?Initial Encounter Date:  ?Flowsheet Row Pulmonary Rehab from 12/18/2021 in [facility] Cardiac and Pulmonary Rehab  ?Date 12/18/21  ? ?  ? ? ?Visit Diagnosis: Post-COVID chronic dyspnea ? ?Patient's Home Medications on Admission: ? ?Current Outpatient Medications:  ?  albuterol (PROVENTIL HFA;VENTOLIN HFA) 108 (90 BASE) MCG/ACT inhaler, Inhale 2 puffs into the lungs every 4 (four) hours as needed for wheezing or shortness of breath., Disp: , Rfl:  ?  atenolol (TENORMIN) 25 MG tablet, Take 25 mg by mouth daily. , Disp: , Rfl:  ?  budesonide-formoterol (SYMBICORT) 160-4.5 MCG/ACT inhaler, Inhale into the lungs., Disp: , Rfl:  ?  Cholecalciferol (VITAMIN D3) 2000 UNITS TABS, Take 1 tablet by mouth daily., Disp: , Rfl:  ?  DULoxetine (CYMBALTA) 60 MG capsule, Take 60 mg by mouth daily., Disp: , Rfl:  ?  famotidine (PEPCID) 20 MG tablet, One after supper, Disp: 30 tablet, Rfl: 11 ?  FEROSUL 325 (65 Fe) MG tablet, Take 325 mg by mouth daily., Disp: , Rfl:  ?  furosemide (LASIX) 40 MG tablet, Take 40 mg by mouth 2 (two) times daily., Disp: , Rfl:  ?  HYDROcodone-acetaminophen (NORCO) 10-325 MG tablet, Take 1 tablet by mouth every 8 (eight) hours. Must last 30 days, Disp: 90 tablet, Rfl: 0 ?  HYDROcodone-acetaminophen (NORCO) 10-325 MG tablet, Take 1 tablet by mouth every 8 (eight) hours. Must last 30 days, Disp: 90 tablet, Rfl: 0 ?  [START ON 03/15/2022] HYDROcodone-acetaminophen (NORCO) 10-325 MG tablet, Take 1 tablet by mouth every 8 (eight) hours. Must last 30 days, Disp: 90 tablet, Rfl: 0 ?  losartan (COZAAR) 25 MG tablet, Take 25 mg by mouth daily., Disp: , Rfl:  ?  pantoprazole (PROTONIX) 40 MG tablet, Take 1 tablet (40 mg total) by mouth daily., Disp: 30  tablet, Rfl: 2 ?  potassium chloride SA (K-DUR,KLOR-CON) 20 MEQ tablet, Take 20 mEq by mouth daily. , Disp: , Rfl:  ?  pravastatin (PRAVACHOL) 20 MG tablet, Take 20 mg by mouth at bedtime. Reported on 12/13/2015, Disp: , Rfl:  ?  spironolactone (ALDACTONE) 25 MG tablet, Take 12.5 mg by mouth daily., Disp: , Rfl:  ?  SYMBICORT 160-4.5 MCG/ACT inhaler, Take 2 puffs first thing in am and then another 2 puffs about 12 hours later., Disp: 1 each, Rfl: 11 ?  Turmeric Curcumin 500 MG CAPS, Take by mouth daily. , Disp: , Rfl:  ?  vitamin C (ASCORBIC ACID) 500 MG tablet, Take 500 mg by mouth daily., Disp: , Rfl:  ? ?Past Medical History: ?Past Medical History:  ?Diagnosis Date  ? Anemia   ? Arthritis   ? COPD (chronic obstructive pulmonary disease) (HCC)   ? Depression   ? Diabetes mellitus without complication (HCC)   ? GERD (gastroesophageal reflux disease)   ? Hiatal hernia   ? History of contrast media allergy. (IVP dye) 09/01/2015  ? History of hiatal hernia   ? Hypercholesteremia   ? Hypertension   ? Morbid obesity (HCC)   ? Scoliosis   ? Shortness of breath dyspnea   ? with exertion  ? ? ?Tobacco Use: ?Social History  ? ?Tobacco Use  ?Smoking Status Never  ?Smokeless Tobacco Never  ?Tobacco Comments  ? does  not smoke   ? ? ?Labs: ?Review Flowsheet   ? ?  ?  Latest Ref Rng & Units 11/23/2009 05/20/2017  ?Labs for [facility] Cardiac and Pulmonary Rehab  ?Hemoglobin A1c 4.8 - 5.6 %  6.1    ?TCO2 0 - 100 mmol/L 29     ?  ? ? Multiple values from one day are sorted in reverse-chronological order  ?  ?  ? ? ? ?Pulmonary Assessment Scores: ? Pulmonary Assessment Scores   ? ? Row Name 12/18/21 1601  ?  ?  ?  ? ADL UCSD  ? SOB Score total 59    ? Rest 0    ? Walk 1    ? Stairs 3    ? Bath 1    ? Dress 2    ? Shop 3    ?  ? CAT Score  ? CAT Score 10    ?  ? mMRC Score  ? mMRC Score 2    ? ?  ?  ? ?  ?  ?UCSD: ?Self-administered rating of dyspnea associated with activities of daily living (ADLs) ?6-point scale (0 = "not at all" to 5 =  "maximal or unable to do because of breathlessness")  ?Scoring Scores range from 0 to 120.  Minimally important difference is 5 units ? ?CAT: ?CAT can identify the health impairment of COPD patients and is better correlated with disease progression.  ?CAT has a scoring range of zero to 40. The CAT score is classified into four groups of low (less than 10), medium (10 - 20), high (21-30) and very high (31-40) based on the impact level of disease on health status. A CAT score over 10 suggests significant symptoms.  A worsening CAT score could be explained by an exacerbation, poor medication adherence, poor inhaler technique, or progression of COPD or comorbid conditions.  ?CAT MCID is 2 points ? ?mMRC: ?mMRC (Modified Medical Research Council) Dyspnea Scale is used to assess the degree of baseline functional disability in patients of respiratory disease due to dyspnea. ?No minimal important difference is established. A decrease in score of 1 point or greater is considered a positive change.  ? ?Pulmonary Function Assessment: ? Pulmonary Function Assessment - 11/23/21 0908   ? ?  ? Breath  ? Shortness of Breath Yes;Limiting activity   ? ?  ?  ? ?  ? ? ?Exercise Target Goals: ?Exercise Program Goal: ?Individual exercise prescription set using results from initial 6 min walk test and THRR while considering  patient?s activity barriers and safety.  ? ?Exercise Prescription Goal: ?Initial exercise prescription builds to 30-45 minutes a day of aerobic activity, 2-3 days per week.  Home exercise guidelines will be given to patient during program as part of exercise prescription that the participant will acknowledge. ? ?Education: Aerobic Exercise: ?- Group verbal and visual presentation on the components of exercise prescription. Introduces F.I.T.T principle from ACSM for exercise prescriptions.  Reviews F.I.T.T. principles of aerobic exercise including progression. Written material given at graduation. ?Flowsheet Row  Pulmonary Rehab from 02/28/2022 in [facility] Cardiac and Pulmonary Rehab  ?Date 01/17/22  ?Educator AS  ?Instruction Review Code 1- Verbalizes Understanding  ? ?  ? ? ?Education: Resistance Exercise: ?- Group verbal and visual presentation on the components of exercise prescription. Introduces F.I.T.T principle from ACSM for exercise prescriptions  Reviews F.I.T.T. principles of resistance exercise including progression. Written material given at graduation. ?Flowsheet Row Pulmonary Rehab from 02/28/2022 in [facility] Cardiac and Pulmonary Rehab  ?  Date 01/24/22  ?Educator AS  ?Instruction Review Code 1- Verbalizes Understanding  ? ?  ? ?  ?Education: Exercise & Equipment Safety: ?- Individual verbal instruction and demonstration of equipment use and safety with use of the equipment. ?Flow[facility]  ?Date 12/18/21  ?Educator KH  ?Instruction Review Code 1- Verbalizes Understanding  ? ?  ? ? ?Education: Exercise Physiology & General Exercise Guidelines: ?- Group verbal and written instruction with models to review the exercise physiology of the cardiovascular system and associated critical values. Provides general exercise guidelines with specific guidelines to those with heart or lung disease.  ?Flowsheet [facility]  ?Date 01/10/22  ?Educator AS  ?Instruction Review Code 1- Verbalizes Understanding  ? ?  ? ? ?Education: Flexibility, Balance, Mind/Body Relaxation: ?- Group verbal and visual presentation with interactive activity on the components of exercise prescription. Introduces F.I.T.T principle from ACSM for exercise prescriptions. Reviews F.I.T.T. principles of flexibility and balance exercise training including progression. Also discusses the mind body connection.  Reviews various relaxation techniques to help reduce and manage stress (i.e. Deep breathing, progressive muscle relaxation, and  visualization). Balance handout provided to take home. Written material given at graduation. ?Flowsheet Row [facility]  ?Date 02/07/22  ?Educator AS  ?Instruction

## 2022-03-07 NOTE — Progress Notes (Signed)
Daily Session Note ? ?Patient Details  ?Name: Mariah Poole ?MRN: [id number] ?Date of Birth: 11-04-1946 ?Referring Provider:   ?Flowsheet Row Pulmonary Rehab from 12/18/2021 in [facility] Cardiac and Pulmonary Rehab  ?Referring Provider Dr. Christinia Gully  ? ?  ? ? ?Encounter Date: 03/07/2022 ? ?Check In: ? Session Check In - 03/07/22 1657   ? ?  ? Check-In  ? Supervising physician immediately available to respond to emergencies See telemetry face sheet for immediately available ER MD   ? Location [facility]-Cardiac & Pulmonary Rehab   ? Staff Present Birdie Sons, MPA, RN;Melissa Cambridge, RDN, LDN;Joseph Dana, RCP,RRT,BSRT;Kara Shaw, MS, ASCM CEP, Exercise Physiologist   ? Virtual Visit No   ? Medication changes reported     No   ? Fall or balance concerns reported    No   ? Warm-up and Cool-down Performed on first and last piece of equipment   ? Resistance Training Performed Yes   ? VAD Patient? No   ? PAD/SET Patient? No   ?  ? Pain Assessment  ? Currently in Pain? No/denies   ? ?  ?  ? ?  ? ? ? ? ? ?Social History  ? ?Tobacco Use  ?Smoking Status Never  ?Smokeless Tobacco Never  ?Tobacco Comments  ? does not smoke   ? ? ?Goals Met:  ?Independence with exercise equipment ?Exercise tolerated well ?No report of concerns or symptoms today ?Strength training completed today ? ?Goals Unmet:  ?Not Applicable ? ?Comments: Pt able to follow exercise prescription today without complaint.  Will continue to monitor for progression. ? ? ? ?Dr. Emily Filbert is Medical Director for [place].  ?Dr. Ottie Glazier is Medical Director for [facility] Pulmonary Rehabilitation. ?

## 2022-03-08 ENCOUNTER — Encounter: Payer: Medicare PPO | Admitting: *Deleted

## 2022-03-08 DIAGNOSIS — R0609 Other forms of dyspnea: Secondary | ICD-10-CM | POA: Diagnosis not present

## 2022-03-08 DIAGNOSIS — U099 Post covid-19 condition, unspecified: Secondary | ICD-10-CM

## 2022-03-08 NOTE — Progress Notes (Signed)
Daily Session Note ? ?Patient Details  ?Name: Mariah Poole ?MRN: [id number] ?Date of Birth: 12-Nov-1946 ?Referring Provider:   ?Flowsheet Row Pulmonary Rehab from 12/18/2021 in [facility] Cardiac and Pulmonary Rehab  ?Referring Provider Dr. Christinia Gully  ? ?  ? ? ?Encounter Date: 03/08/2022 ? ?Check In: ? Session Check In - 03/08/22 1725   ? ?  ? Check-In  ? Supervising physician immediately available to respond to emergencies See telemetry face sheet for immediately available ER MD   ? Location [facility]-Cardiac & Pulmonary Rehab   ? Staff Present Renita Papa, RN BSN;Joseph Wilbur, RCP,RRT,BSRT;Amanda [place], [place], ACSM CEP, Exercise Physiologist   ? Virtual Visit No   ? Medication changes reported     No   ? Fall or balance concerns reported    No   ? Warm-up and Cool-down Performed on first and last piece of equipment   ? Resistance Training Performed Yes   ? VAD Patient? No   ? PAD/SET Patient? No   ?  ? Pain Assessment  ? Currently in Pain? No/denies   ? ?  ?  ? ?  ? ? ? ? ? ?Social History  ? ?Tobacco Use  ?Smoking Status Never  ?Smokeless Tobacco Never  ?Tobacco Comments  ? does not smoke   ? ? ?Goals Met:  ?Independence with exercise equipment ?Exercise tolerated well ?No report of concerns or symptoms today ?Strength training completed today ? ?Goals Unmet:  ?Not Applicable ? ?Comments: Pt able to follow exercise prescription today without complaint.  Will continue to monitor for progression. ? ? ? ?Dr. Emily Filbert is Medical Director for [place].  ?Dr. Ottie Glazier is Medical Director for [facility] Pulmonary Rehabilitation. ?

## 2022-03-09 ENCOUNTER — Ambulatory Visit: Payer: Medicare PPO | Admitting: Adult Health

## 2022-03-09 ENCOUNTER — Encounter: Payer: Self-pay | Admitting: Adult Health

## 2022-03-09 DIAGNOSIS — R5381 Other malaise: Secondary | ICD-10-CM

## 2022-03-09 DIAGNOSIS — J4530 Mild persistent asthma, uncomplicated: Secondary | ICD-10-CM

## 2022-03-09 DIAGNOSIS — I5032 Chronic diastolic (congestive) heart failure: Secondary | ICD-10-CM | POA: Diagnosis not present

## 2022-03-09 NOTE — Assessment & Plan Note (Signed)
Appears euvolemic on exam.  Continue on current regimen 

## 2022-03-09 NOTE — Assessment & Plan Note (Signed)
Patient has seen significant benefit with pulmonary rehab.  Patient is encouraged to continue with her activity as tolerated ?

## 2022-03-09 NOTE — Patient Instructions (Signed)
Continue on Symbicort 2 puffs Twice daily, rinse after use.  ?Albuterol inhaler 1-2 puffs every 4-6hrs as needed   ?Activity as tolerated.  ?Continue on Lasix daily  ?Low salt diet.  ?Follow up with Dr. Melvyn Novas  in 6 months and As needed   ? ? ?

## 2022-03-09 NOTE — Progress Notes (Signed)
? ?'@Patient'  ID: Mariah Poole, female    DOB: 22-Jun-1946, 76 y.o.   MRN: [id number] ? ?Chief Complaint  ?Patient presents with  ? Follow-up  ? ? ?Referring provider: ?Marguerita Merles, MD ? ?HPI: ?76 year old female never smoker seen for pulmonary consult September 14, 2021 for asthma and shortness of breath ?Medical history significant for diastolic congestive heart failure ? ?TEST/EVENTS :  ?08/2021 Eosinophils 100, IgE 44 ? ?VQ scan September 15, 2021 negative for PE. ? ?Chest x-ray September 14, 2021 showed moderate elevation the right hemidiaphragm new since 2016 and a moderate size hiatal hernia ? ?Sniff test on September 22, 2021 showed normal motion of the diaphragms, overall excursion is relatively decreased ? ?Myoview 11/2019 low risk study .  ? ?RA Factor , ESR, CRP , CCP Neg 12/2018  ? ? ?03/09/2022 Follow up : Asthma and Dyspnea  ?Patient presents for a 77-monthfollow-up.  Patient has underlying asthma.  She is on Symbicort twice daily.  Patient was having ongoing shortness of breath.  Work-up showed VQ scan was negative for PE.  Chest x-ray showed moderate elevation of the right hemidiaphragm.  Sniff test done on September 22, 2021 showed normal motion of the diaphragms.  Overall excursion was relatively decreased. ?Myoview and 2021 showed a low risk study.  Patient was felt to have multifactorial dyspnea with some physical deconditioning.  She was referred to pulmonary rehab.  She feels that this has really helped her with increased stamina and endurance.  Her shortness of breath has improved some. Feels the pulmonary rehab has helped so much .  ?She was set up for pulmonary function testing that was completed on January 16, 2022 that showed moderate restriction and significant reversibility.  FEV1 was 57%, ratio at 80, FVC 54%, positive bronchodilator response, DLCO 62%.  Patient has significant small airways reversibility. Feels Symbicort helps , admits she is not always compliant . Says she forgets sometimes.  Does use albuterol inhaler before pulmonary rehab.  ? ?Patient has diastolic congestive heart failure.  Says that she has been doing well with no increased leg swelling.  She remains on Lasix 40 mg twice daily. ? ? ? ? ?Allergies  ?Allergen Reactions  ? Contrast Media [Iodinated Contrast Media] Other (See Comments)  ?  Cardiovascular collapse. Loss of consciousness. Generalized swelling. ?Betadine OK  ? Ioxaglate Other (See Comments)  ?  "I pass out" ?Betadine OK  ? Shellfish Allergy   ? ? ?Immunization History  ?Administered Date(s) Administered  ? Influenza,inj,Quad PF,6+ Mos 12/10/2015, 09/30/2016  ? Influenza-Unspecified 09/16/2019, 11/16/2020  ? Moderna SARS-COV2 Booster Vaccination 04/06/2021  ? Moderna Sars-Covid-2 Vaccination 01/22/2020, 04/06/2021  ? PPension scheme manager143yr& up 09/13/2021  ? Pneumococcal Conjugate-13 11/02/2014  ? Pneumococcal Polysaccharide-23 07/06/2010  ? Tdap 04/15/2014  ? Zoster Recombinat (Shingrix) 05/11/2020  ? Zoster, Live 12/31/2011  ? ? ?Past Medical History:  ?Diagnosis Date  ? Anemia   ? Arthritis   ? COPD (chronic obstructive pulmonary disease) (HCC-[place]  ? Depression   ? Diabetes mellitus without complication ([facility]  ? GERD (gastroesophageal reflux disease)   ? Hiatal hernia   ? History of contrast media allergy. (IVP dye) 09/01/2015  ? History of hiatal hernia   ? Hypercholesteremia   ? Hypertension   ? Morbid obesity ([facility]  ? Scoliosis   ? Shortness of breath dyspnea   ? with exertion  ? ? ?Tobacco History: ?Social History  ? ?Tobacco Use  ?Smoking Status  Never  ?Smokeless Tobacco Never  ?Tobacco Comments  ? does not smoke   ? ?Counseling given: Not Answered ?Tobacco comments: does not smoke  ? ? ?Outpatient Medications Prior to Visit  ?Medication Sig Dispense Refill  ? albuterol (PROVENTIL HFA;VENTOLIN HFA) 108 (90 BASE) MCG/ACT inhaler Inhale 2 puffs into the lungs every 4 (four) hours as needed for wheezing or shortness of breath.    ? atenolol  (TENORMIN) 25 MG tablet Take 25 mg by mouth daily.     ? Cholecalciferol (VITAMIN D3) 2000 UNITS TABS Take 1 tablet by mouth daily.    ? DULoxetine (CYMBALTA) 60 MG capsule Take 60 mg by mouth daily.    ? famotidine (PEPCID) 20 MG tablet One after supper 30 tablet 11  ? FEROSUL 325 (65 Fe) MG tablet Take 325 mg by mouth daily.    ? furosemide (LASIX) 40 MG tablet Take 40 mg by mouth 2 (two) times daily.    ? HYDROcodone-acetaminophen (NORCO) 10-325 MG tablet Take 1 tablet by mouth every 8 (eight) hours. Must last 30 days 90 tablet 0  ? [START ON 03/15/2022] HYDROcodone-acetaminophen (NORCO) 10-325 MG tablet Take 1 tablet by mouth every 8 (eight) hours. Must last 30 days 90 tablet 0  ? losartan (COZAAR) 25 MG tablet Take 25 mg by mouth daily.    ? pantoprazole (PROTONIX) 40 MG tablet Take 1 tablet (40 mg total) by mouth daily. 30 tablet 2  ? potassium chloride SA (K-DUR,KLOR-CON) 20 MEQ tablet Take 20 mEq by mouth daily.     ? pravastatin (PRAVACHOL) 20 MG tablet Take 20 mg by mouth at bedtime. Reported on 12/13/2015    ? SYMBICORT 160-4.5 MCG/ACT inhaler Take 2 puffs first thing in am and then another 2 puffs about 12 hours later. 1 each 11  ? Turmeric Curcumin 500 MG CAPS Take by mouth daily.     ? vitamin C (ASCORBIC ACID) 500 MG tablet Take 500 mg by mouth daily.    ? budesonide-formoterol (SYMBICORT) 160-4.5 MCG/ACT inhaler Inhale into the lungs.    ? spironolactone (ALDACTONE) 25 MG tablet Take 12.5 mg by mouth daily.    ? HYDROcodone-acetaminophen (NORCO) 10-325 MG tablet Take 1 tablet by mouth every 8 (eight) hours. Must last 30 days 90 tablet 0  ? ?No facility-administered medications prior to visit.  ? ? ? ?Review of Systems:  ? ?Constitutional:   No  weight loss, night sweats,  Fevers, chills, fatigue, or  lassitude. ? ?HEENT:   No headaches,  Difficulty swallowing,  Tooth/dental problems, or  Sore throat,  ?              No sneezing, itching, ear ache, nasal congestion, post nasal drip,  ? ?CV:  No chest  pain,  Orthopnea, PND, swelling in lower extremities, anasarca, dizziness, palpitations, syncope.  ? ?GI  No heartburn, indigestion, abdominal pain, nausea, vomiting, diarrhea, change in bowel habits, loss of appetite, bloody stools.  ? ?Resp: No shortness of breath with exertion or at rest.  No excess mucus, no productive cough,  No non-productive cough,  No coughing up of blood.  No change in color of mucus.  No wheezing.  No chest wall deformity ? ?Skin: no rash or lesions. ? ?GU: no dysuria, change in color of urine, no urgency or frequency.  No flank pain, no hematuria  ? ?MS:  No joint pain or swelling.  No decreased range of motion.  No back pain. ? ? ? ?Physical Exam ? ?BP  122/76 (BP Location: Left Arm, Cuff Size: Normal)   Pulse 84   Temp 97.7 ?F (36.5 ?C) (Temporal)   Ht 5' (1.524 m)   Wt 195 lb 6.4 oz (88.6 kg)   SpO2 96%   BMI 38.16 kg/m?  ? ?GEN: A/Ox3; pleasant , NAD, well nourished , [place]  ?  ?HEENT:  [place]/AT,  EACs-clear, TMs-wnl, NOSE-clear, THROAT-clear, no lesions, no postnasal drip or exudate noted.  ? ?NECK:  Supple w/ fair ROM; no JVD; normal carotid impulses w/o bruits; no thyromegaly or nodules palpated; no lymphadenopathy.   ? ?RESP  Clear  P & A; w/o, wheezes/ rales/ or rhonchi. no accessory muscle use, no dullness to percussion ? ?CARD:  RRR, no m/r/g, no peripheral edema, pulses intact, no cyanosis or clubbing. ? ?GI:   Soft & nt; nml bowel sounds; no organomegaly or masses detected.  ? ?Musco: Warm bil, no deformities or joint swelling noted.  ? ?Neuro: alert, no focal deficits noted.   ? ?Skin: Warm, no lesions or rashes ? ? ? ? ? ?BMET ? ? ? ? ?ProBNP ?No results found for: PROBNP ? ?Imaging: ?No results found. ? ? ? ? ?No results found for: NITRICOXIDE ? ? ? ? ? ?Assessment & Plan:  ? ?Asthma ?Improved compensation on current regimen.  PFTs consistent with underlying asthma with significant reversibility/bronchodilator response ?Encouraged to use her Symbicort on a consistent  basis. ? ? ?Plan  ?Patient Instructions  ?Continue on Symbicort 2 puffs Twice daily, rinse after use.  ?Albuterol inhaler 1-2 puffs every 4-6hrs as needed   ?Activity as tolerated.  ?Continue on Lasix daily  ?Low salt

## 2022-03-09 NOTE — Assessment & Plan Note (Addendum)
Improved compensation on current regimen.  PFTs consistent with underlying asthma with significant reversibility/bronchodilator response ?Encouraged to use her Symbicort on a consistent basis. ? ? ?Plan  ?Patient Instructions  ?Continue on Symbicort 2 puffs Twice daily, rinse after use.  ?Albuterol inhaler 1-2 puffs every 4-6hrs as needed   ?Activity as tolerated.  ?Continue on Lasix daily  ?Low salt diet.  ?Follow up with Dr. Sherene Sires  in 6 months and As needed   ? ? ?  ? ?

## 2022-03-12 ENCOUNTER — Encounter: Payer: Medicare PPO | Admitting: *Deleted

## 2022-03-12 DIAGNOSIS — R0609 Other forms of dyspnea: Secondary | ICD-10-CM | POA: Diagnosis not present

## 2022-03-12 DIAGNOSIS — U099 Post covid-19 condition, unspecified: Secondary | ICD-10-CM

## 2022-03-12 NOTE — Progress Notes (Signed)
Daily Session Note ? ?Patient Details  ?Name: Mariah Poole ?MRN: [id number] ?Date of Birth: 11/13/1946 ?Referring Provider:   ?Flowsheet Row Pulmonary Rehab from 12/18/2021 in [facility] Cardiac and Pulmonary Rehab  ?Referring Provider Dr. Christinia Gully  ? ?  ? ? ?Encounter Date: 03/12/2022 ? ?Check In: ? Session Check In - 03/12/22 1727   ? ?  ? Check-In  ? Supervising physician immediately available to respond to emergencies See telemetry face sheet for immediately available ER MD   ? Location [facility]-Cardiac & Pulmonary Rehab   ? Staff Present Renita Papa, RN BSN;Joseph [place], RCP,RRT,BSRT;Melissa [place], [place], LDN   ? Virtual Visit No   ? Medication changes reported     No   ? Fall or balance concerns reported    No   ? Warm-up and Cool-down Performed on first and last piece of equipment   ? Resistance Training Performed Yes   ? VAD Patient? No   ? PAD/SET Patient? No   ?  ? Pain Assessment  ? Currently in Pain? No/denies   ? ?  ?  ? ?  ? ? ? ? Exercise Prescription Changes - 03/12/22 1700   ? ?  ? Response to Exercise  ? Blood Pressure (Admit) 132/82   ? Blood Pressure (Exit) 128/72   ? Heart Rate (Admit) 70 bpm   ? Heart Rate (Exercise) 91 bpm   ? Heart Rate (Exit) 73 bpm   ? Oxygen Saturation (Admit) 93 %   ? Oxygen Saturation (Exercise) 90 %   ? Oxygen Saturation (Exit) 96 %   ? Rating of Perceived Exertion (Exercise) 12   ? Perceived Dyspnea (Exercise) 0   ? Symptoms none   ? Duration Continue with 30 min of aerobic exercise without signs/symptoms of physical distress.   ? Intensity THRR unchanged   ?  ? Progression  ? Progression Continue to progress workloads to maintain intensity without signs/symptoms of physical distress.   ? Average METs 2.48   ?  ? Resistance Training  ? Training Prescription Yes   ? Weight 3 lb   ? Reps 10-15   ?  ? Interval Training  ? Interval Training No   ?  ? NuStep  ? Level 3   ? Minutes 30   ? METs 2.6   ?  ? Home Exercise Plan  ? Plans to continue exercise at Home (comment)   pedal  machine, walking, Youtube videos  ? Frequency Add 2 additional days to program exercise sessions.   start with 1 day of exercise  ? Initial Home Exercises Provided 01/22/22   ?  ? Oxygen  ? Maintain Oxygen Saturation 88% or higher   ? ?  ?  ? ?  ? ? ?Social History  ? ?Tobacco Use  ?Smoking Status Never  ?Smokeless Tobacco Never  ?Tobacco Comments  ? does not smoke   ? ? ?Goals Met:  ?Independence with exercise equipment ?Exercise tolerated well ?No report of concerns or symptoms today ?Strength training completed today ? ?Goals Unmet:  ?Not Applicable ? ?Comments: Pt able to follow exercise prescription today without complaint.  Will continue to monitor for progression. ? ? ? ?Dr. Emily Filbert is Medical Director for [place].  ?Dr. Ottie Glazier is Medical Director for [facility] Pulmonary Rehabilitation. ?

## 2022-03-14 DIAGNOSIS — R0609 Other forms of dyspnea: Secondary | ICD-10-CM | POA: Diagnosis not present

## 2022-03-14 DIAGNOSIS — U099 Post covid-19 condition, unspecified: Secondary | ICD-10-CM

## 2022-03-14 NOTE — Progress Notes (Signed)
Daily Session Note ? ?Patient Details  ?Name: Mariah Poole ?MRN: [id number] ?Date of Birth: 05/06/1946 ?Referring Provider:   ?Flowsheet Row Pulmonary Rehab from 12/18/2021 in [facility] Cardiac and Pulmonary Rehab  ?Referring Provider Dr. Christinia Gully  ? ?  ? ? ?Encounter Date: 03/14/2022 ? ?Check In: ? Session Check In - 03/14/22 1639   ? ?  ? Check-In  ? Supervising physician immediately available to respond to emergencies See telemetry face sheet for immediately available ER MD   ? Location [facility]-Cardiac & Pulmonary Rehab   ? Staff Present Birdie Sons, MPA, RN;Joseph [place], RCP,RRT,BSRT;Melissa [place], RDN, LDN   ? Virtual Visit No   ? Medication changes reported     No   ? Fall or balance concerns reported    No   ? Warm-up and Cool-down Performed on first and last piece of equipment   ? Resistance Training Performed Yes   ? VAD Patient? No   ? PAD/SET Patient? No   ?  ? Pain Assessment  ? Currently in Pain? No/denies   ? ?  ?  ? ?  ? ? ? ? ? ?Social History  ? ?Tobacco Use  ?Smoking Status Never  ?Smokeless Tobacco Never  ?Tobacco Comments  ? does not smoke   ? ? ?Goals Met:  ?Independence with exercise equipment ?Exercise tolerated well ?No report of concerns or symptoms today ?Strength training completed today ? ?Goals Unmet:  ?Not Applicable ? ?Comments: Pt able to follow exercise prescription today without complaint.  Will continue to monitor for progression. ? ? ? ?Dr. Emily Filbert is Medical Director for [place].  ?Dr. Ottie Glazier is Medical Director for [facility] Pulmonary Rehabilitation. ?

## 2022-03-19 ENCOUNTER — Encounter: Payer: Medicare PPO | Attending: Internal Medicine

## 2022-03-19 DIAGNOSIS — U099 Post covid-19 condition, unspecified: Secondary | ICD-10-CM | POA: Diagnosis present

## 2022-03-19 DIAGNOSIS — R0609 Other forms of dyspnea: Secondary | ICD-10-CM | POA: Insufficient documentation

## 2022-03-19 NOTE — Progress Notes (Signed)
Daily Session Note ? ?Patient Details  ?Name: Mariah Poole ?MRN: [id number] ?Date of Birth: Apr 08, 1946 ?Referring Provider:   ?Flowsheet Row Pulmonary Rehab from 12/18/2021 in [facility] Cardiac and Pulmonary Rehab  ?Referring Provider Dr. Christinia Gully  ? ?  ? ? ?Encounter Date: 03/19/2022 ? ?Check In: ? Session Check In - 03/19/22 1713   ? ?  ? Check-In  ? Supervising physician immediately available to respond to emergencies See telemetry face sheet for immediately available ER MD   ? Location [facility]-Cardiac & Pulmonary Rehab   ? Staff Present Birdie Sons, MPA, RN;Joseph [place], RCP,RRT,BSRT;Melissa [place], RDN, LDN   ? Virtual Visit No   ? Medication changes reported     No   ? Fall or balance concerns reported    No   ? Warm-up and Cool-down Performed on first and last piece of equipment   ? Resistance Training Performed Yes   ? VAD Patient? No   ? PAD/SET Patient? No   ?  ? Pain Assessment  ? Currently in Pain? No/denies   ? ?  ?  ? ?  ? ? ? ? ? ?Social History  ? ?Tobacco Use  ?Smoking Status Never  ?Smokeless Tobacco Never  ?Tobacco Comments  ? does not smoke   ? ? ?Goals Met:  ?Independence with exercise equipment ?Exercise tolerated well ?No report of concerns or symptoms today ?Strength training completed today ? ?Goals Unmet:  ?Not Applicable ? ?Comments: Pt able to follow exercise prescription today without complaint.  Will continue to monitor for progression. ? ? ? ?Dr. Emily Filbert is Medical Director for [place].  ?Dr. Ottie Glazier is Medical Director for [facility] Pulmonary Rehabilitation. ?

## 2022-03-21 VITALS — Ht <= 58 in | Wt 200.7 lb

## 2022-03-21 DIAGNOSIS — U099 Post covid-19 condition, unspecified: Secondary | ICD-10-CM

## 2022-03-21 DIAGNOSIS — R0609 Other forms of dyspnea: Secondary | ICD-10-CM | POA: Diagnosis not present

## 2022-03-21 NOTE — Progress Notes (Signed)
Daily Session Note ? ?Patient Details  ?Name: Mariah Poole ?MRN: [id number] ?Date of Birth: 04-24-1946 ?Referring Provider:   ?Flowsheet Row Pulmonary Rehab from 12/18/2021 in [facility] Cardiac and Pulmonary Rehab  ?Referring Provider Dr. Christinia Gully  ? ?  ? ? ?Encounter Date: 03/21/2022 ? ?Check In: ? Session Check In - 03/21/22 1653   ? ?  ? Check-In  ? Supervising physician immediately available to respond to emergencies See telemetry face sheet for immediately available ER MD   ? Location [facility]-Cardiac & Pulmonary Rehab   ? Staff Present Birdie Sons, MPA, RN;Joseph [place], RCP,RRT,BSRT;Melissa [place], RDN, LDN   ? Virtual Visit No   ? Medication changes reported     No   ? Fall or balance concerns reported    No   ? Warm-up and Cool-down Performed on first and last piece of equipment   ? Resistance Training Performed Yes   ? VAD Patient? No   ? PAD/SET Patient? No   ?  ? Pain Assessment  ? Currently in Pain? No/denies   ? ?  ?  ? ?  ? ? ? ? ? ?Social History  ? ?Tobacco Use  ?Smoking Status Never  ?Smokeless Tobacco Never  ?Tobacco Comments  ? does not smoke   ? ? ?Goals Met:  ?Independence with exercise equipment ?Exercise tolerated well ?No report of concerns or symptoms today ?Strength training completed today ? ?Goals Unmet:  ?Not Applicable ? ?Comments: Pt able to follow exercise prescription today without complaint.  Will continue to monitor for progression. ? ? ? ?Dr. Emily Filbert is Medical Director for [place].  ?Dr. Ottie Glazier is Medical Director for [facility] Pulmonary Rehabilitation. ?

## 2022-03-21 NOTE — Patient Instructions (Signed)
Discharge Patient Instructions ? ?Patient Details  ?Name: Mariah Poole ?MRN: [id number] ?Date of Birth: 1946-01-06 ?Referring Provider:  Tanda Rockers, MD ? ? ?Number of Visits: 55 ? ?Reason for Discharge:  ?Patient reached a stable level of exercise. ?Patient independent in their exercise. ?Patient has met program and personal goals. ? ?Smoking History:  ?Social History  ? ?Tobacco Use  ?Smoking Status Never  ?Smokeless Tobacco Never  ?Tobacco Comments  ? does not smoke   ? ? ?Diagnosis:  ?Post-COVID chronic dyspnea ? ?Initial Exercise Prescription: ? Initial Exercise Prescription - 12/18/21 1500   ? ?  ? Date of Initial Exercise RX and Referring Provider  ? Date 12/18/21   ? Referring Provider Dr. Christinia Gully   ?  ? Oxygen  ? Maintain Oxygen Saturation 88% or higher   ?  ? Treadmill  ? MPH 0.8   ? Grade 0   ? Minutes 15   ? METs 1   ?  ? NuStep  ? Level 1   ? SPM 80   ? Minutes 15   ? METs 1   ?  ? Biostep-RELP  ? Level 1   ? SPM 50   ? Minutes 15   ? METs 1   ?  ? Track  ? Laps 6   ? Minutes 15   ? METs 1.3   ?  ? Prescription Details  ? Frequency (times per week) 3   ? Duration Progress to 30 minutes of continuous aerobic without signs/symptoms of physical distress   ?  ? Intensity  ? THRR 40-80% of Max Heartrate 106-131   ? Ratings of Perceived Exertion 11-13   ? Perceived Dyspnea 0-4   ?  ? Progression  ? Progression Continue to progress workloads to maintain intensity without signs/symptoms of physical distress.   ?  ? Resistance Training  ? Training Prescription Yes   ? Weight 2   ? Reps 10-15   ? ?  ?  ? ?  ? ? ?Discharge Exercise Prescription (Final Exercise Prescription Changes): ? Exercise Prescription Changes - 03/12/22 1700   ? ?  ? Response to Exercise  ? Blood Pressure (Admit) 132/82   ? Blood Pressure (Exit) 128/72   ? Heart Rate (Admit) 70 bpm   ? Heart Rate (Exercise) 91 bpm   ? Heart Rate (Exit) 73 bpm   ? Oxygen Saturation (Admit) 93 %   ? Oxygen Saturation (Exercise) 90 %   ? Oxygen  Saturation (Exit) 96 %   ? Rating of Perceived Exertion (Exercise) 12   ? Perceived Dyspnea (Exercise) 0   ? Symptoms none   ? Duration Continue with 30 min of aerobic exercise without signs/symptoms of physical distress.   ? Intensity THRR unchanged   ?  ? Progression  ? Progression Continue to progress workloads to maintain intensity without signs/symptoms of physical distress.   ? Average METs 2.48   ?  ? Resistance Training  ? Training Prescription Yes   ? Weight 3 lb   ? Reps 10-15   ?  ? Interval Training  ? Interval Training No   ?  ? NuStep  ? Level 3   ? Minutes 30   ? METs 2.6   ?  ? Home Exercise Plan  ? Plans to continue exercise at Home (comment)   pedal machine, walking, Youtube videos  ? Frequency Add 2 additional days to program exercise sessions.   start with 1 day of  exercise  ? Initial Home Exercises Provided 01/22/22   ?  ? Oxygen  ? Maintain Oxygen Saturation 88% or higher   ? ?  ?  ? ?  ? ? ?Functional Capacity: ? 6 Minute Walk   ? ? [place] Name 12/18/21 1547 03/21/22 1840  ?  ?  ? 6 Minute Walk  ? Phase Initial Discharge   ? Distance 600 feet 750 feet   ? Distance % Change -- 25 %   ? Distance Feet Change -- 150 ft   ? Walk Time 6 minutes 6 minutes   ? # of Rest Breaks 0 0   ? MPH 1.14 1.42   ? METS 0.74 2.19   ? RPE 11 11   ? Perceived Dyspnea  1 1   ? VO2 Peak 2.58 7.68   ? Symptoms No No   ? Resting HR 82 bpm 100 bpm   ? Resting BP 104/60 122/62   ? Resting Oxygen Saturation  95 % 94 %   ? Exercise Oxygen Saturation  during 6 min walk 88 % 91 %   ? Max Ex. HR 110 bpm 148 bpm   ? Max Ex. BP 152/82 172/82   ? 2 Minute Post BP 124/80 126/72   ?  ? Interval HR  ? 1 Minute HR 94 115   ? 2 Minute HR 70 111   ? 3 Minute HR 61 140   ? 4 Minute HR 91 148   ? 5 Minute HR 71 148   ? 6 Minute HR 110 142   ? 2 Minute Post HR 89 117   ? Interval Heart Rate? Yes Yes   ?  ? Interval Oxygen  ? Interval Oxygen? Yes Yes   ? Baseline Oxygen Saturation % 95 % 94 %   ? 1 Minute Oxygen Saturation % 91 % 92 %   ? 1  Minute Liters of Oxygen 0 L 0 L  RA   ? 2 Minute Oxygen Saturation % 88 % 94 %   ? 2 Minute Liters of Oxygen 0 L 0 L   ? 3 Minute Oxygen Saturation % 88 % 94 %   ? 3 Minute Liters of Oxygen 0 L 0 L   ? 4 Minute Oxygen Saturation % 90 % 91 %   ? 4 Minute Liters of Oxygen 0 L 0 L   ? 5 Minute Oxygen Saturation % 90 % 92 %   ? 5 Minute Liters of Oxygen 0 L 0 L   ? 6 Minute Oxygen Saturation % 90 % 92 %   ? 6 Minute Liters of Oxygen 0 L 0 L   ? 2 Minute Post Oxygen Saturation % 94 % 95 %   ? 2 Minute Post Liters of Oxygen 0 L 0 L   ? ?  ?  ? ?  ? ? ? ?Nutrition & Weight - Outcomes: ? Pre Biometrics - 12/18/21 1555   ? ?  ? Pre Biometrics  ? Height '4\' 9"'  (1.448 m)   ? Weight 200 lb 11.2 oz (91 kg)   ? BMI (Calculated) 43.42   ? Single Leg Stand 0 seconds   ? ?  ?  ? ?  ? ? Post Biometrics - 03/21/22 1839   ? ?  ?  Post  Biometrics  ? Height '4\' 9"'  (1.448 m)   ? Weight 200 lb 11.2 oz (91 kg)   ? BMI (Calculated) 43.42   ? ?  ?  ? ?  ? ? ?  Nutrition: ? Nutrition Therapy & Goals - 02/14/22 1608   ? ?  ? Nutrition Therapy  ? Diet Heart healthy, low Na   ? Drug/Food Interactions Statins/Certain Fruits   ? Protein (specify units) 70g   ? Fiber 25 grams   ? Whole Grain Foods 3 servings   ? Saturated Fats 16 max. grams   ? Fruits and Vegetables 8 servings/day   ? Sodium 2 grams   ?  ? Personal Nutrition Goals  ? Nutrition Goal ST: eat at least 2 hours before bedtime LT: limit saturated fat <16g/day, limit Na <2g/day, follow MyPlate structure   ? Comments 76 y.o. F admitted for post-COVID chronic dyspnea. PMHx includes CHF, HTN, GERD, A1C 6.1, CKD stg 3. Relevant medications include vit D, cymbalta, pepcid, lasix, hydrocodone, protonix, vit C, potassium chloride. PYP Score: 62. Vegetables & Fruits 11/12. Breads, Grains & Cereals 5/12. Red & Processed Meat 8/12. Poultry 0/2. Fish & Shellfish 3/4. Beans, Nuts & Seeds 1/4. Milk & Dairy Foods 4/6. Toppings, Oils, Seasonings & Salt 13/20. Sweets, Snacks & Restaurant Food 8/14. Beverages  9/10.  Used to do TOPS (take off pounds sensibly) - she has gained back ~30 pounds since COVID and is now 195lbs. She reports it was a community and they would discuss the things they did that week and share recipes and stories. She reports they were focused on behavior changes and moderation. She has recieved nutrition education before as well as nutrition education from this RD - she has no questions at this time. She rpeorts that she used to cook more when she lost the weight previously and feels this has to do with age: discussed prepping ingredients and some pre-made meals she could freeze. This week she has had more fish, she likes baked potatoes and salads (with lots of vegetables in them), she likes eggs and tuna salad. She feels she is a "saltaholic". She uses fatback with her green beans. She eats collards, turnip beans, peas, corn, cabbage soup, tomatoes, and onions. She reports eating food in bed around 10pm and would like to stop this, suggested eating when she gets home from rehab to help attach this new habit of eating earlier to an existing habit.   ?  ? Intervention Plan  ? Intervention Prescribe, educate and counsel regarding individualized specific dietary modifications aiming towards targeted core components such as weight, hypertension, lipid management, diabetes, heart failure and other comorbidities.   ? Expected Outcomes Short Term Goal: Understand basic principles of dietary content, such as calories, fat, sodium, cholesterol and nutrients.;Long Term Goal: Adherence to prescribed nutrition plan.;Short Term Goal: A plan has been developed with personal nutrition goals set during dietitian appointment.   ? ?  ?  ? ?  ? ? ? ?Goals reviewed with patient; copy given to patient. ?

## 2022-03-22 ENCOUNTER — Encounter: Payer: Medicare PPO | Admitting: *Deleted

## 2022-03-22 DIAGNOSIS — U099 Post covid-19 condition, unspecified: Secondary | ICD-10-CM

## 2022-03-22 DIAGNOSIS — R0609 Other forms of dyspnea: Secondary | ICD-10-CM | POA: Diagnosis not present

## 2022-03-22 NOTE — Progress Notes (Signed)
Daily Session Note ? ?Patient Details  ?Name: Mariah Poole ?MRN: [id number] ?Date of Birth: Mar 24, 1946 ?Referring Provider:   ?Flowsheet Row Pulmonary Rehab from 12/18/2021 in [facility] Cardiac and Pulmonary Rehab  ?Referring Provider Dr. Christinia Gully  ? ?  ? ? ?Encounter Date: 03/22/2022 ? ?Check In: ? Session Check In - 03/22/22 1738   ? ?  ? Check-In  ? Supervising physician immediately available to respond to emergencies See telemetry face sheet for immediately available ER MD   ? Location [facility]-Cardiac & Pulmonary Rehab   ? Staff Present Heath Lark, RN, BSN, CCRP;Joseph [place], RCP,RRT,BSRT;Melissa [place], [place], LDN   ? Virtual Visit No   ? Medication changes reported     No   ? Fall or balance concerns reported    No   ? Warm-up and Cool-down Performed on first and last piece of equipment   ? Resistance Training Performed Yes   ? VAD Patient? No   ? PAD/SET Patient? No   ?  ? Pain Assessment  ? Currently in Pain? No/denies   ? ?  ?  ? ?  ? ? ? ? ? ?Social History  ? ?Tobacco Use  ?Smoking Status Never  ?Smokeless Tobacco Never  ?Tobacco Comments  ? does not smoke   ? ? ?Goals Met:  ?Proper associated with RPD/PD & O2 Sat ?Independence with exercise equipment ?Exercise tolerated well ?No report of concerns or symptoms today ? ?Goals Unmet:  ?Not Applicable ? ?Comments: Pt able to follow exercise prescription today without complaint.  Will continue to monitor for progression. ? ? ? ?Dr. Emily Filbert is Medical Director for [place].  ?Dr. Ottie Glazier is Medical Director for [facility] Pulmonary Rehabilitation. ?

## 2022-03-25 NOTE — Progress Notes (Signed)
PROVIDER NOTE: Information contained herein reflects review and annotations entered in association with encounter. Interpretation of such information and data should be left to medically-trained personnel. Information provided to patient can be located elsewhere in the medical record under "Patient Instructions". Document created using STT-dictation technology, any transcriptional errors that may result from process are unintentional.  ?  ?Patient: Mariah Poole  Service Category: E/M  Provider: Gaspar Cola, MD  ?DOB: September 02, 1946  DOS: 03/29/2022  Specialty: Interventional Pain Management  ?MRN: [id number]  Setting: Ambulatory outpatient  PCP: Marguerita Merles, MD  ?Type: Established Patient    Referring Provider: Marguerita Merles, MD  ?Location: Office  Delivery: Face-to-face    ? ?HPI  ?Mariah Poole, a 76 y.o. year old female, is here today because of her Chronic pain syndrome [G89.4]. Mariah Poole primary complain today is Sciatica (Right is worse than left) ?Last encounter: My last encounter with her was on 01/30/2022. 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, L4-5, L5-S1) w/ neurogenic claudication; Lumbar foraminal stenosis (Multilevel) (Bilateral); Cervicalgia; DDD (degenerative disc disease), cervical; Cervical facet hypertrophy; Cervical facet joint syndrome; Lumbar facet hypertrophy; 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 ?Pain Assessment: Severity of Chronic pain is reported as a 0-No pain/10. Location: Hip Right, Left/R > L. Onset: More than a month ago. Quality: Sharp, Stabbing. Timing: Intermittent. Modifying factor(s): medciations, rest, topicals, salon pas. ?Vitals:  height is 5' (1.524 m) and weight is 195 lb (88.5 kg). Her temporal temperature is 97.3 ?F (36.3 ?C) (abnormal). Her blood pressure is 104/84 and her pulse is 85. Her respiration is 18 and oxygen saturation is 99%.  ? ?Reason for encounter: medication management.  The patient indicates doing well with the current medication regimen. No adverse reactions or side effects reported to the medications.  The patient refers having some recurrence of her right hip pain.  When asked to identify what she called the hip, she pointed at her right buttocks area.  She also indicates that this pain is intermittent and at this time she is not having it.  For this reason I will be ordering an x-ray of her right hip area and I have instructed the patient to give [place] a call for further evaluation if the patient returns and does not seem to be going away.  She understood and accepted. ? ?RTCB: 07/13/2022 ?Nonopioids transferred 11/09/2020: Magnesium, Lyrica, and Mobic. ? ?Pharmacotherapy Assessment  ?Analgesic: Hydrocodone/APAP 10/325, 1 tab PO q 8 hrs PRN (30 mg/day of hydrocodone) (975 mg/day of acetaminophen) ?MME/day: 30 mg/day.  ? ?Monitoring: ?[place] PMP: PDMP reviewed during this encounter.       ?Pharmacotherapy: No side-effects or adverse reactions reported. ?Compliance: No problems identified. ?Effectiveness: Clinically acceptable. ? ?[name], RN  03/29/2022  1:48 PM  Signed ?  Nursing Pain Medication Assessment:  ?Safety precautions to be maintained throughout the outpatient stay will include: orient to surroundings, keep bed in low position, maintain call bell within reach at all times, provide assistance with transfer out of bed and ambulation.  ?Medication Inspection Compliance: Pill count conducted under aseptic conditions, in front of the patient. Neither the pills nor the bottle was removed from the patient's sight at any time. Once count was completed pills were immediately returned to the patient in their original bottle. ? ?Medication: Hydrocodone/APAP ?Pill/Patch Count:  42 of 90 pills remain ?Pill/Patch Appearance: Markings consistent with prescribed medication ?Bottle Appearance: Standard pharmacy container. Clearly labeled. ?Filled Date: 04 / 27 / 2023 ?Last Medication intake:  Today ?   UDS:  ?Summary  ?Date Value Ref Range Status  ?07/19/2021 Note  Final  ?  Comment:  ?  ==================================================================== ?[facility] Select [facility] (MW) ?==================================================================== ?Test                             Result       Flag       Units ? ?Drug Present and Declared for Prescription Verification ?  Hydrocodone                    696          EXPECTED   ng/mg creat ?  Hydromorphone                  42           EXPECTED   ng/mg creat ?  Dihydrocodeine                 332          EXPECTED   ng/mg creat ?  Norhydrocodone                 1671         EXPECTED   ng/mg creat ?   Sources of hydrocodone include scheduled prescription medications. ?   Hydromorphone, dihydrocodeine and norhydrocodone are expected ?   metabolites of hydrocodone. Hydromorphone and dihydrocodeine are ?   also available as scheduled prescription medications. ? ?==================================================================== ?Test                      Result    Flag   Units      Ref Range ?   Creatinine              195              mg/dL      >=20 ?==================================================================== ?Declared Medications: ? The flagging and interpretation on this report are based on the ? following declared medications.  Unexpected results may arise from ? inaccuracies in the declared medications. ? ? **Note: The testing scope of this panel includes these medications: ? ? Hydrocodone ([place]) ? ? **Note: The testing scope of this panel does not include the ? following reported medications: ? ? Acetaminophen (Norco) ? Albuterol (Ventolin HFA) ? Atenolol (Tenormin) ? Budesonide (Symbicort) ? Duloxetine (Cymbalta) ? Formoterol (Symbicort) ? Furosemide (Lasix) ? Iron ? Losartan (Cozaar) ? Magnesium (Mag-Ox) ? Meloxicam (Mobic) ? Pantoprazole (Protonix) ? Potassium (Klor-Con) ? Pravastatin (Pravachol) ? Pregabalin (Lyrica) ? Spironolactone (Aldactone) ? Turmeric ? Vitamin C ? Vitamin D3 ?==================================================================== ?For clinical consultation, please call [phone]. ?==================================================================== ?  ?  ? ?ROS  ?Constitutional: Denies any  fever or chills ?Gastrointestinal: No reported hemesis, hematochezia, vomiting, or acute GI distress ?Musculoskeletal: Denies any acute onset joint swelling, redness, loss of ROM, or weakness ?Neurological: No reported episodes of acute onset apraxia, aphasia, dysarthria, agnosia, amnesia, paralysis, loss of coordination, or loss of consciousness ? ?Medication Review  ?DULoxetine, HYDROcodone-acetaminophen, Turmeric Curcumin, Vitamin D3, albuterol, atenolol, budesonide-formoterol, famotidine, ferrous sulfate, furosemide, losartan, pantoprazole, potassium chloride SA, pravastatin, spironolactone, and vitamin C ? ?History Review  ?Allergy: Ms. Westover is allergic to contrast media [iodinated contrast media], ioxaglate, and shellfish allergy. ?Drug: Ms. Gilleylen  reports current  drug use. ?Alcohol:  reports no history of alcohol use. ?Tobacco:  reports that she has never smoked. She has never used smokeless tobacco. ?Social: Ms. Dunkerley  reports that she has never smoked. She has never used

## 2022-03-26 ENCOUNTER — Encounter: Payer: Medicare PPO | Admitting: *Deleted

## 2022-03-26 DIAGNOSIS — R0609 Other forms of dyspnea: Secondary | ICD-10-CM

## 2022-03-26 NOTE — Progress Notes (Signed)
Pulmonary Individual Treatment Plan  Patient Details  Name: Mariah Poole MRN: [id number] Date of Birth: 1946/01/24 Referring Provider:   Flowsheet Row Pulmonary Rehab from 12/18/2021 in [facility] Cardiac and Pulmonary Rehab  Referring Provider Dr. Sandrea Hughs       Initial Encounter Date:  Flowsheet Row Pulmonary Rehab from 12/18/2021 in [facility] Cardiac and Pulmonary Rehab  Date 12/18/21       Visit Diagnosis: Post-COVID chronic dyspnea  Patient's Home Medications on Admission:  Current Outpatient Medications:    albuterol (PROVENTIL HFA;VENTOLIN HFA) 108 (90 BASE) MCG/ACT inhaler, Inhale 2 puffs into the lungs every 4 (four) hours as needed for wheezing or shortness of breath., Disp: , Rfl:    atenolol (TENORMIN) 25 MG tablet, Take 25 mg by mouth daily. , Disp: , Rfl:    Cholecalciferol (VITAMIN D3) 2000 UNITS TABS, Take 1 tablet by mouth daily., Disp: , Rfl:    DULoxetine (CYMBALTA) 60 MG capsule, Take 60 mg by mouth daily., Disp: , Rfl:    famotidine (PEPCID) 20 MG tablet, One after supper, Disp: 30 tablet, Rfl: 11   FEROSUL 325 (65 Fe) MG tablet, Take 325 mg by mouth daily., Disp: , Rfl:    furosemide (LASIX) 40 MG tablet, Take 40 mg by mouth 2 (two) times daily., Disp: , Rfl:    HYDROcodone-acetaminophen (NORCO) 10-325 MG tablet, Take 1 tablet by mouth every 8 (eight) hours. Must last 30 days, Disp: 90 tablet, Rfl: 0   HYDROcodone-acetaminophen (NORCO) 10-325 MG tablet, Take 1 tablet by mouth every 8 (eight) hours. Must last 30 days, Disp: 90 tablet, Rfl: 0   losartan (COZAAR) 25 MG tablet, Take 25 mg by mouth daily., Disp: , Rfl:    pantoprazole (PROTONIX) 40 MG tablet, Take 1 tablet (40 mg total) by mouth daily., Disp: 30 tablet, Rfl: 2   potassium chloride SA (K-DUR,KLOR-CON) 20 MEQ tablet, Take 20 mEq by mouth daily. , Disp: , Rfl:    pravastatin (PRAVACHOL) 20 MG tablet, Take 20 mg by mouth at bedtime. Reported on 12/13/2015, Disp: , Rfl:    spironolactone (ALDACTONE) 25 MG  tablet, Take 12.5 mg by mouth daily., Disp: , Rfl:    SYMBICORT 160-4.5 MCG/ACT inhaler, Take 2 puffs first thing in am and then another 2 puffs about 12 hours later., Disp: 1 each, Rfl: 11   Turmeric Curcumin 500 MG CAPS, Take by mouth daily. , Disp: , Rfl:    vitamin C (ASCORBIC ACID) 500 MG tablet, Take 500 mg by mouth daily., Disp: , Rfl:   Past Medical History: Past Medical History:  Diagnosis Date   Anemia    Arthritis    COPD (chronic obstructive pulmonary disease) (HCC)    Depression    Diabetes mellitus without complication (HCC)    GERD (gastroesophageal reflux disease)    Hiatal hernia    History of contrast media allergy. (IVP dye) 09/01/2015   History of hiatal hernia    Hypercholesteremia    Hypertension    Morbid obesity (HCC)    Scoliosis    Shortness of breath dyspnea    with exertion    Tobacco Use: Social History   Tobacco Use  Smoking Status Never  Smokeless Tobacco Never  Tobacco Comments   does not smoke     Labs: Review Flowsheet        Latest Ref Rng & Units 11/23/2009 05/20/2017  Labs for ITP Cardiac and Pulmonary Rehab  Hemoglobin A1c 4.8 - 5.6 %  6.1  TCO2 0 - 100 mmol/L 29         Multiple values from one day are sorted in reverse-chronological order         Pulmonary Assessment Scores:  Pulmonary Assessment Scores     Row Name 12/18/21 1601         ADL UCSD   SOB Score total 59     Rest 0     Walk 1     Stairs 3     Bath 1     Dress 2     Shop 3       CAT Score   CAT Score 10       mMRC Score   mMRC Score 2              UCSD: Self-administered rating of dyspnea associated with activities of daily living (ADLs) 6-point scale (0 = "not at all" to 5 = "maximal or unable to do because of breathlessness")  Scoring Scores range from 0 to 120.  Minimally important difference is 5 units  CAT: CAT can identify the health impairment of COPD patients and is better correlated with disease progression.  CAT has a  scoring range of zero to 40. The CAT score is classified into four groups of low (less than 10), medium (10 - 20), high (21-30) and very high (31-40) based on the impact level of disease on health status. A CAT score over 10 suggests significant symptoms.  A worsening CAT score could be explained by an exacerbation, poor medication adherence, poor inhaler technique, or progression of COPD or comorbid conditions.  CAT MCID is 2 points  mMRC: mMRC (Modified Medical Research Council) Dyspnea Scale is used to assess the degree of baseline functional disability in patients of respiratory disease due to dyspnea. No minimal important difference is established. A decrease in score of 1 point or greater is considered a positive change.   Pulmonary Function Assessment:  Pulmonary Function Assessment - 11/23/21 0908       Breath   Shortness of Breath Yes;Limiting activity             Exercise Target Goals: Exercise Program Goal: Individual exercise prescription set using results from initial 6 min walk test and THRR while considering  patient's activity barriers and safety.   Exercise Prescription Goal: Initial exercise prescription builds to 30-45 minutes a day of aerobic activity, 2-3 days per week.  Home exercise guidelines will be given to patient during program as part of exercise prescription that the participant will acknowledge.  Education: Aerobic Exercise: - Group verbal and visual presentation on the components of exercise prescription. Introduces F.I.T.T principle from ACSM for exercise prescriptions.  Reviews F.I.T.T. principles of aerobic exercise including progression. Written material given at graduation. Flowsheet Row Pulmonary Rehab from 03/21/2022 in [facility] Cardiac and Pulmonary Rehab  Date 03/21/22  Educator KL  Instruction Review Code 1- Verbalizes Understanding       Education: Resistance Exercise: - Group verbal and visual presentation on the components of exercise  prescription. Introduces F.I.T.T principle from ACSM for exercise prescriptions  Reviews F.I.T.T. principles of resistance exercise including progression. Written material given at graduation. Flowsheet Row Pulmonary Rehab from 03/21/2022 in [facility] Cardiac and Pulmonary Rehab  Date 01/24/22  Educator AS  Instruction Review Code 1- Verbalizes Understanding        Education: Exercise & Equipment Safety: - Individual verbal instruction and demonstration of equipment use and safety with use of the equipment. Flowsheet Row  Pulmonary Rehab from 03/21/2022 in [facility] Cardiac and Pulmonary Rehab  Date 12/18/21  Educator [facility]  Instruction Review Code 1- Verbalizes Understanding       Education: Exercise Physiology & General Exercise Guidelines: - Group verbal and written instruction with models to review the exercise physiology of the cardiovascular system and associated critical values. Provides general exercise guidelines with specific guidelines to those with heart or lung disease.  Flowsheet Row Pulmonary Rehab from 03/21/2022 in [facility] Cardiac and Pulmonary Rehab  Date 03/14/22  Educator KL  Instruction Review Code 1- Verbalizes Understanding       Education: Flexibility, Balance, Mind/Body Relaxation: - Group verbal and visual presentation with interactive activity on the components of exercise prescription. Introduces F.I.T.T principle from ACSM for exercise prescriptions. Reviews F.I.T.T. principles of flexibility and balance exercise training including progression. Also discusses the mind body connection.  Reviews various relaxation techniques to help reduce and manage stress (i.e. Deep breathing, progressive muscle relaxation, and visualization). Balance handout provided to take home. Written material given at graduation. Flowsheet Row Pulmonary Rehab from 03/21/2022 in [facility] Cardiac and Pulmonary Rehab  Date 02/07/22  Educator AS  Instruction Review Code 1- Verbalizes Understanding        Activity Barriers & Risk Stratification:   6 Minute Walk:  6 Minute Walk     Row Name 12/18/21 1547 03/21/22 1840       6 Minute Walk   Phase Initial Discharge    Distance 600 feet 750 feet    Distance % Change -- 25 %    Distance Feet Change -- 150 ft    Walk Time 6 minutes 6 minutes    # of Rest Breaks 0 0    MPH 1.14 1.42    METS 0.74 2.19    RPE 11 11    Perceived Dyspnea  1 1    VO2 Peak 2.58 7.68    Symptoms No No    Resting HR 82 bpm 100 bpm    Resting BP 104/60 122/62    Resting Oxygen Saturation  95 % 94 %    Exercise Oxygen Saturation  during 6 min walk 88 % 91 %    Max Ex. HR 110 bpm 148 bpm    Max Ex. BP 152/82 172/82    2 Minute Post BP 124/80 126/72      Interval HR   1 Minute HR 94 115    2 Minute HR 70 111    3 Minute HR 61 140    4 Minute HR 91 148    5 Minute HR 71 148    6 Minute HR 110 142    2 Minute Post HR 89 117    Interval Heart Rate? Yes Yes      Interval Oxygen   Interval Oxygen? Yes Yes    Baseline Oxygen Saturation % 95 % 94 %    1 Minute Oxygen Saturation % 91 % 92 %    1 Minute Liters of Oxygen 0 L 0 L  RA    2 Minute Oxygen Saturation % 88 % 94 %    2 Minute Liters of Oxygen 0 L 0 L    3 Minute Oxygen Saturation % 88 % 94 %    3 Minute Liters of Oxygen 0 L 0 L    4 Minute Oxygen Saturation % 90 % 91 %    4 Minute Liters of Oxygen 0 L 0 L    5 Minute Oxygen Saturation %  90 % 92 %    5 Minute Liters of Oxygen 0 L 0 L    6 Minute Oxygen Saturation % 90 % 92 %    6 Minute Liters of Oxygen 0 L 0 L    2 Minute Post Oxygen Saturation % 94 % 95 %    2 Minute Post Liters of Oxygen 0 L 0 L            Oxygen Initial Assessment:  Oxygen Initial Assessment - 12/18/21 1603       Home Oxygen   Home Oxygen Device None    Sleep Oxygen Prescription None    Home Exercise Oxygen Prescription None    Home Resting Oxygen Prescription None    Compliance with Home Oxygen Use Yes      Initial 6 min Walk   Oxygen Used None       Program Oxygen Prescription   Program Oxygen Prescription None      Intervention   Short Term Goals To learn and understand importance of monitoring SPO2 with pulse oximeter and demonstrate accurate use of the pulse oximeter.;To learn and understand importance of maintaining oxygen saturations>88%;To learn and demonstrate proper pursed lip breathing techniques or other breathing techniques. ;To learn and demonstrate proper use of respiratory medications    Long  Term Goals Verbalizes importance of monitoring SPO2 with pulse oximeter and return demonstration;Maintenance of O2 saturations>88%;Exhibits proper breathing techniques, such as pursed lip breathing or other method taught during program session;Compliance with respiratory medication;Demonstrates proper use of MDI's             Oxygen Re-Evaluation:  Oxygen Re-Evaluation     Row Name 12/20/21 1653 01/22/22 1808 02/19/22 1721 03/22/22 1745       Program Oxygen Prescription   Program Oxygen Prescription None None None None      Home Oxygen   Home Oxygen Device None None None None    Sleep Oxygen Prescription None None None None    Home Exercise Oxygen Prescription None None None None    Home Resting Oxygen Prescription None None None None    Compliance with Home Oxygen Use Yes Yes Yes Yes      Goals/Expected Outcomes   Short Term Goals To learn and understand importance of monitoring SPO2 with pulse oximeter and demonstrate accurate use of the pulse oximeter.;To learn and understand importance of maintaining oxygen saturations>88%;To learn and demonstrate proper pursed lip breathing techniques or other breathing techniques.  To learn and understand importance of monitoring SPO2 with pulse oximeter and demonstrate accurate use of the pulse oximeter.;To learn and understand importance of maintaining oxygen saturations>88%;To learn and demonstrate proper pursed lip breathing techniques or other breathing techniques.  To learn and  understand importance of monitoring SPO2 with pulse oximeter and demonstrate accurate use of the pulse oximeter.;To learn and understand importance of maintaining oxygen saturations>88%;To learn and demonstrate proper pursed lip breathing techniques or other breathing techniques.  To learn and understand importance of monitoring SPO2 with pulse oximeter and demonstrate accurate use of the pulse oximeter.;To learn and understand importance of maintaining oxygen saturations>88%;To learn and demonstrate proper pursed lip breathing techniques or other breathing techniques. ;Other    Long  Term Goals Verbalizes importance of monitoring SPO2 with pulse oximeter and return demonstration;Maintenance of O2 saturations>88%;Exhibits proper breathing techniques, such as pursed lip breathing or other method taught during program session;Compliance with respiratory medication Verbalizes importance of monitoring SPO2 with pulse oximeter and return demonstration;Maintenance of O2 saturations>88%;Exhibits proper  breathing techniques, such as pursed lip breathing or other method taught during program session;Compliance with respiratory medication Verbalizes importance of monitoring SPO2 with pulse oximeter and return demonstration;Maintenance of O2 saturations>88%;Exhibits proper breathing techniques, such as pursed lip breathing or other method taught during program session;Compliance with respiratory medication Verbalizes importance of monitoring SPO2 with pulse oximeter and return demonstration;Maintenance of O2 saturations>88%;Exhibits proper breathing techniques, such as pursed lip breathing or other method taught during program session;Compliance with respiratory medication    Comments Reviewed PLB technique with pt.  Talked about how it works and it's importance in maintaining their exercise saturations. Mariah Poole practices PLB when needed, she also just bought a pulse ox and is going to start watching her O2 and HR at home. She  is starting to walk more and feels she can do more tasks at home such as making the bed. Mariah Poole has been checking oxygen at home.  It has stayed consistently above 88%.  She is able to do ADLs much better. Mariah Poole is doing great in the program and is graduating on Monday. She states that she has no questions avbout her breathing or medications. She improved on her shortness of breath and oxygen saturation.    Goals/Expected Outcomes Short: Become more profiecient at using PLB.   Long: Become independent at using PLB. Short: Continue PLB and watch O2 with home pulse ox Long: Become independent using PLB Short: continue to exercise to improve breathing Long: improve SOB Short: graduate [employer]. Long: maintain breathing techniques independently.             Oxygen Discharge (Final Oxygen Re-Evaluation):  Oxygen Re-Evaluation - 03/22/22 1745       Program Oxygen Prescription   Program Oxygen Prescription None      Home Oxygen   Home Oxygen Device None    Sleep Oxygen Prescription None    Home Exercise Oxygen Prescription None    Home Resting Oxygen Prescription None    Compliance with Home Oxygen Use Yes      Goals/Expected Outcomes   Short Term Goals To learn and understand importance of monitoring SPO2 with pulse oximeter and demonstrate accurate use of the pulse oximeter.;To learn and understand importance of maintaining oxygen saturations>88%;To learn and demonstrate proper pursed lip breathing techniques or other breathing techniques. ;Other    Long  Term Goals Verbalizes importance of monitoring SPO2 with pulse oximeter and return demonstration;Maintenance of O2 saturations>88%;Exhibits proper breathing techniques, such as pursed lip breathing or other method taught during program session;Compliance with respiratory medication    Comments Mariah Poole is doing great in the program and is graduating on Monday. She states that she has no questions avbout her breathing or medications. She improved on  her shortness of breath and oxygen saturation.    Goals/Expected Outcomes Short: graduate [employer]. Long: maintain breathing techniques independently.             Initial Exercise Prescription:  Initial Exercise Prescription - 12/18/21 1500       Date of Initial Exercise RX and Referring Provider   Date 12/18/21    Referring Provider Dr. Sandrea Hughs      Oxygen   Maintain Oxygen Saturation 88% or higher      Treadmill   MPH 0.8    Grade 0    Minutes 15    METs 1      NuStep   Level 1    SPM 80    Minutes 15    METs 1  Biostep-RELP   Level 1    SPM 50    Minutes 15    METs 1      Track   Laps 6    Minutes 15    METs 1.3      Prescription Details   Frequency (times per week) 3    Duration Progress to 30 minutes of continuous aerobic without signs/symptoms of physical distress      Intensity   THRR 40-80% of Max Heartrate 106-131    Ratings of Perceived Exertion 11-13    Perceived Dyspnea 0-4      Progression   Progression Continue to progress workloads to maintain intensity without signs/symptoms of physical distress.      Resistance Training   Training Prescription Yes    Weight 2    Reps 10-15             Perform Capillary Blood Glucose checks as needed.  Exercise Prescription Changes:   Exercise Prescription Changes     Row Name 12/18/21 1500 01/15/22 1400 01/22/22 1700 02/01/22 0900 02/13/22 1200     Response to Exercise   Blood Pressure (Admit) 104/60 158/84 -- 140/78 132/75   Blood Pressure (Exercise) 152/82 160/60 -- 162/80 --   Blood Pressure (Exit) 112/64 148/70 -- 138/70 122/60   Heart Rate (Admit) 82 bpm 119 bpm -- 84 bpm 109 bpm   Heart Rate (Exercise) 110 bpm 128 bpm -- 104 bpm 103 bpm   Heart Rate (Exit) 81 bpm 110 bpm -- 90 bpm 90 bpm   Oxygen Saturation (Admit) 95 % 93 % -- 94 % 90 %   Oxygen Saturation (Exercise) 88 % 89 % -- 91 % 91 %   Oxygen Saturation (Exit) 92 % 91 % -- 92 % 91 %   Rating of Perceived  Exertion (Exercise) 11 13 -- 12 15   Perceived Dyspnea (Exercise) 1 1 -- 3 1   Symptoms none none -- none --   Comments 6 MWT results -- -- -- --   Duration -- Continue with 30 min of aerobic exercise without signs/symptoms of physical distress. -- Continue with 30 min of aerobic exercise without signs/symptoms of physical distress. Continue with 30 min of aerobic exercise without signs/symptoms of physical distress.   Intensity -- THRR unchanged -- THRR unchanged THRR unchanged     Progression   Progression -- -- -- Continue to progress workloads to maintain intensity without signs/symptoms of physical distress. Continue to progress workloads to maintain intensity without signs/symptoms of physical distress.   Average METs -- -- -- 2.01 2.5     Resistance Training   Training Prescription -- Yes -- Yes Yes   Weight -- 2 lb -- 3 lb 3 lb   Reps -- 10-15 -- 10-15 10-15     Interval Training   Interval Training -- -- -- No No     NuStep   Level -- 3 -- 3 3   Minutes -- 15 -- 15 15   METs -- 2.75 -- 2.2 2.2     Biostep-RELP   Level -- -- -- 2 1   Minutes -- -- -- 15 15   METs -- -- -- 2 --     Track   Laps -- -- -- 10 --   Minutes -- -- -- 15 --   METs -- -- -- 1.54 --     Home Exercise Plan   Plans to continue exercise at -- -- Home (comment)  pedal machine,  walking, Youtube videos Home (comment)  pedal machine, walking, Youtube videos Home (comment)  pedal machine, walking, Youtube videos   Frequency -- -- Add 2 additional days to program exercise sessions.  start with 1 day of exercise Add 2 additional days to program exercise sessions.  start with 1 day of exercise Add 2 additional days to program exercise sessions.  start with 1 day of exercise   Initial Home Exercises Provided -- -- 01/22/22 01/22/22 01/22/22     Oxygen   Maintain Oxygen Saturation -- -- -- 88% or higher 88% or higher    Row Name 02/26/22 1300 03/12/22 1700           Response to Exercise   Blood  Pressure (Admit) 118/62 132/82      Blood Pressure (Exit) 122/72 128/72      Heart Rate (Admit) 109 bpm 70 bpm      Heart Rate (Exercise) 124 bpm 91 bpm      Heart Rate (Exit) 95 bpm 73 bpm      Oxygen Saturation (Admit) 95 % 93 %      Oxygen Saturation (Exercise) 94 % 90 %      Oxygen Saturation (Exit) 92 % 96 %      Rating of Perceived Exertion (Exercise) 13 12      Perceived Dyspnea (Exercise) 1 0      Symptoms none none      Duration Continue with 30 min of aerobic exercise without signs/symptoms of physical distress. Continue with 30 min of aerobic exercise without signs/symptoms of physical distress.      Intensity THRR unchanged THRR unchanged        Progression   Progression Continue to progress workloads to maintain intensity without signs/symptoms of physical distress. Continue to progress workloads to maintain intensity without signs/symptoms of physical distress.      Average METs 2.42 2.48        Resistance Training   Training Prescription Yes Yes      Weight 3 lb 3 lb      Reps 10-15 10-15        Interval Training   Interval Training No No        NuStep   Level 3 3      Minutes 30 30      METs 2.6 2.6        Biostep-RELP   Level 1 --      Minutes 15 --      METs 2 --        Track   Laps 15 --      Minutes 15 --      METs 1.82 --        Home Exercise Plan   Plans to continue exercise at Home (comment)  pedal machine, walking, Youtube videos Home (comment)  pedal machine, walking, Youtube videos      Frequency Add 2 additional days to program exercise sessions.  start with 1 day of exercise Add 2 additional days to program exercise sessions.  start with 1 day of exercise      Initial Home Exercises Provided 01/22/22 01/22/22        Oxygen   Maintain Oxygen Saturation 88% or higher 88% or higher               Exercise Comments:   Exercise Comments     Row Name 12/20/21 1652 03/26/22 1730         Exercise Comments First  full day of exercise!   Patient was oriented to gym and equipment including functions, settings, policies, and procedures.  Patient's individual exercise prescription and treatment plan were reviewed.  All starting workloads were established based on the results of the 6 minute walk test done at initial orientation visit.  The plan for exercise progression was also introduced and progression will be customized based on patient's performance and goals. Mariah Poole graduated today from  rehab with 36 sessions completed.  Details of the patient's exercise prescription and what She needs to do in order to continue the prescription and progress were discussed with patient.  Patient was given a copy of prescription and goals.  Patient verbalized understanding.  Mariah Poole plans to continue to exercise by swimming at the [facility].               Exercise Goals and Review:   Exercise Goals     Row Name 12/18/21 1554             Exercise Goals   Increase Physical Activity Yes       Intervention Provide advice, education, support and counseling about physical activity/exercise needs.;Develop an individualized exercise prescription for aerobic and resistive training based on initial evaluation findings, risk stratification, comorbidities and participant's personal goals.       Expected Outcomes Short Term: Attend rehab on a regular basis to increase amount of physical activity.;Long Term: Add in home exercise to make exercise part of routine and to increase amount of physical activity.;Long Term: Exercising regularly at least 3-5 days a week.       Increase Strength and Stamina Yes       Intervention Provide advice, education, support and counseling about physical activity/exercise needs.;Develop an individualized exercise prescription for aerobic and resistive training based on initial evaluation findings, risk stratification, comorbidities and participant's personal goals.       Expected Outcomes Short Term: Increase workloads from  initial exercise prescription for resistance, speed, and METs.;Short Term: Perform resistance training exercises routinely during rehab and add in resistance training at home;Long Term: Improve cardiorespiratory fitness, muscular endurance and strength as measured by increased METs and functional capacity ( )       Able to understand and use rate of perceived exertion (RPE) scale Yes       Intervention Provide education and explanation on how to use RPE scale       Expected Outcomes Short Term: Able to use RPE daily in rehab to express subjective intensity level;Long Term:  Able to use RPE to guide intensity level when exercising independently       Able to understand and use Dyspnea scale Yes       Intervention Provide education and explanation on how to use Dyspnea scale       Expected Outcomes Short Term: Able to use Dyspnea scale daily in rehab to express subjective sense of shortness of breath during exertion;Long Term: Able to use Dyspnea scale to guide intensity level when exercising independently       Knowledge and understanding of Target Heart Rate Range (THRR) Yes       Intervention Provide education and explanation of THRR including how the numbers were predicted and where they are located for reference       Expected Outcomes Short Term: Able to state/look up THRR;Long Term: Able to use THRR to govern intensity when exercising independently;Short Term: Able to use daily as guideline for intensity in rehab       Able  to check pulse independently Yes       Intervention Provide education and demonstration on how to check pulse in carotid and radial arteries.;Review the importance of being able to check your own pulse for safety during independent exercise       Expected Outcomes Short Term: Able to explain why pulse checking is important during independent exercise;Long Term: Able to check pulse independently and accurately       Understanding of Exercise Prescription Yes        Intervention Provide education, explanation, and written materials on patient's individual exercise prescription       Expected Outcomes Short Term: Able to explain program exercise prescription;Long Term: Able to explain home exercise prescription to exercise independently                Exercise Goals Re-Evaluation :  Exercise Goals Re-Evaluation     Row Name 12/20/21 1652 01/15/22 1413 01/15/22 1415 01/22/22 1746 02/01/22 0905     Exercise Goal Re-Evaluation   Exercise Goals Review Increase Physical Activity;Able to understand and use rate of perceived exertion (RPE) scale;Knowledge and understanding of Target Heart Rate Range (THRR);Understanding of Exercise Prescription;Increase Strength and Stamina;Able to understand and use Dyspnea scale;Able to check pulse independently Increase Physical Activity;Increase Strength and Stamina -- Increase Physical Activity;Increase Strength and Stamina;Understanding of Exercise Prescription Increase Physical Activity;Increase Strength and Stamina;Understanding of Exercise Prescription   Comments Reviewed RPE and dyspnea scales, THR and program prescription with pt today.  Pt voiced understanding and was given a copy of goals to take home. Mariah Poole has been attending consistently and reaches THR range.  Staff will encourage trying 3 lb for strength training. Reviewed home exercise with pt today.  Pt plans to walk and use her pedal machine for exercise. She is looking  at the [facility].Reviewed THR, pulse, RPE, sign and symptoms, pulse oximetery and when to call 911 or MD.  Also discussed weather considerations and indoor options.  Pt voiced understanding. Mariah Poole has been doing well in rehab.  She is up to 10 laps on the track and level 3 on the NuStep.  She has also moved up to 3 lb hand weights.  We will continue to montior her progress.   Expected Outcomes Short: Use RPE daily to regulate intensity. Long: Follow program prescription in THR. -- Short: try 3 lb Long:   continue to build stamina Short: Start with  1 day of exercise Long: Exercise independently at appropriate prescription Short: Continue to move up workloads Long: Continue to improve stamina    Row Name 02/13/22 1227 02/19/22 1732 02/26/22 1311 03/12/22 1727       Exercise Goal Re-Evaluation   Exercise Goals Review Increase Physical Activity;Increase Strength and Stamina Increase Physical Activity;Increase Strength and Stamina Increase Physical Activity;Increase Strength and Stamina Increase Physical Activity;Increase Strength and Stamina    Comments Mariah Poole attends consistently and oxygen has stayed in the 90s in all recent sessions.  We will conitnue to monitor progress. Mariah Poole has a pedal machine she uses at home for exercise.  She also has some weights to use at home.  She used to go to water exercise and TOPS at the [employer] before covid.  She may try that again when she finshes LW. Mariah Poole is doing great. She feels better overall. She walks the track only some sessions, last time she got up to 15 laps which is her highest yet! Her oxygen has been maintaining above 88% during exercise. We hope to see her  overall METs improve as she progresses throughout the program. Mariah Poole continues to attend rehab consistently. She has not walked the track in a bit due to having a broken walker. We have tried other machines and they do not fit her well, which limits her using the T4 most times. She stays there at level 3  ranging around 2.7 METS overall. She is due for her post and should improve significantly.    Expected Outcomes Short:  maintain consistent attendance Long:  build overall stamina Short: exercise 1-2 days wheh not at [employer] Long: maintain exercise indepedently Short: Build up more tolerance on the track Long: Continue to increase overall MET level Short: Improve on Long: Continue to increase overall strength & stamina             Discharge Exercise Prescription (Final Exercise Prescription  Changes):  Exercise Prescription Changes - 03/12/22 1700       Response to Exercise   Blood Pressure (Admit) 132/82    Blood Pressure (Exit) 128/72    Heart Rate (Admit) 70 bpm    Heart Rate (Exercise) 91 bpm    Heart Rate (Exit) 73 bpm    Oxygen Saturation (Admit) 93 %    Oxygen Saturation (Exercise) 90 %    Oxygen Saturation (Exit) 96 %    Rating of Perceived Exertion (Exercise) 12    Perceived Dyspnea (Exercise) 0    Symptoms none    Duration Continue with 30 min of aerobic exercise without signs/symptoms of physical distress.    Intensity THRR unchanged      Progression   Progression Continue to progress workloads to maintain intensity without signs/symptoms of physical distress.    Average METs 2.48      Resistance Training   Training Prescription Yes    Weight 3 lb    Reps 10-15      Interval Training   Interval Training No      NuStep   Level 3    Minutes 30    METs 2.6      Home Exercise Plan   Plans to continue exercise at Home (comment)   pedal machine, walking, Youtube videos   Frequency Add 2 additional days to program exercise sessions.   start with 1 day of exercise   Initial Home Exercises Provided 01/22/22      Oxygen   Maintain Oxygen Saturation 88% or higher             Nutrition:  Target Goals: Understanding of nutrition guidelines, daily intake of sodium 1500mg , cholesterol 200mg , calories 30% from fat and 7% or less from saturated fats, daily to have 5 or more servings of fruits and vegetables.  Education: All About Nutrition: -Group instruction provided by verbal, written material, interactive activities, discussions, models, and posters to present general guidelines for heart healthy nutrition including fat, fiber, MyPlate, the role of sodium in heart healthy nutrition, utilization of the nutrition label, and utilization of this knowledge for meal planning. Follow up email sent as well. Written material given at graduation. Flowsheet Row  Pulmonary Rehab from 03/21/2022 in [facility] Cardiac and Pulmonary Rehab  Date 01/31/22  Educator [facility]  Instruction Review Code 1- Verbalizes Understanding       Biometrics:  Pre Biometrics - 12/18/21 1555       Pre Biometrics   Height 4\' 9"  (1.448 m)    Weight 200 lb 11.2 oz (91 kg)    BMI (Calculated) 43.42    Single Leg Stand  0 seconds             Post Biometrics - 03/21/22 1839        Post  Biometrics   Height 4\' 9"  (1.448 m)    Weight 200 lb 11.2 oz (91 kg)    BMI (Calculated) 43.42             Nutrition Therapy Plan and Nutrition Goals:  Nutrition Therapy & Goals - 02/14/22 1608       Nutrition Therapy   Diet Heart healthy, low Na    Drug/Food Interactions Statins/Certain Fruits    Protein (specify units) 70g    Fiber 25 grams    Whole Grain Foods 3 servings    Saturated Fats 16 max. grams    Fruits and Vegetables 8 servings/day    Sodium 2 grams      Personal Nutrition Goals   Nutrition Goal ST: eat at least 2 hours before bedtime LT: limit saturated fat <16g/day, limit Na <2g/day, follow MyPlate structure    Comments 76 y.o. F admitted for post-COVID chronic dyspnea. PMHx includes CHF, HTN, GERD, A1C 6.1, CKD stg 3. Relevant medications include vit D, cymbalta, pepcid, lasix, hydrocodone, protonix, vit C, potassium chloride. PYP Score: 62. Vegetables & Fruits 11/12. Breads, Grains & Cereals 5/12. Red & Processed Meat 8/12. Poultry 0/2. Fish & Shellfish 3/4. Beans, Nuts & Seeds 1/4. Milk & Dairy Foods 4/6. Toppings, Oils, Seasonings & Salt 13/20. Sweets, Snacks & Restaurant Food 8/14. Beverages 9/10.  Used to do TOPS (take off pounds sensibly) - she has gained back ~30 pounds since COVID and is now 195lbs. She reports it was a community and they would discuss the things they did that week and share recipes and stories. She reports they were focused on behavior changes and moderation. She has recieved nutrition education before as well as nutrition education from  this RD - she has no questions at this time. She rpeorts that she used to cook more when she lost the weight previously and feels this has to do with age: discussed prepping ingredients and some pre-made meals she could freeze. This week she has had more fish, she likes baked potatoes and salads (with lots of vegetables in them), she likes eggs and tuna salad. She feels she is a "saltaholic". She uses fatback with her green beans. She eats collards, turnip beans, peas, corn, cabbage soup, tomatoes, and onions. She reports eating food in bed around 10pm and would like to stop this, suggested eating when she gets home from rehab to help attach this new habit of eating earlier to an existing habit.      Intervention Plan   Intervention Prescribe, educate and counsel regarding individualized specific dietary modifications aiming towards targeted core components such as weight, hypertension, lipid management, diabetes, heart failure and other comorbidities.    Expected Outcomes Short Term Goal: Understand basic principles of dietary content, such as calories, fat, sodium, cholesterol and nutrients.;Long Term Goal: Adherence to prescribed nutrition plan.;Short Term Goal: A plan has been developed with personal nutrition goals set during dietitian appointment.             Nutrition Assessments:  MEDIFICTS Score Key: ?70 Need to make dietary changes  40-70 Heart Healthy Diet ? 40 Therapeutic Level Cholesterol Diet  Flowsheet Row Pulmonary Rehab from 12/18/2021 in [facility] Cardiac and Pulmonary Rehab  Picture Your Plate Total Score on Admission 62      Picture Your Plate Scores: <53 Unhealthy dietary pattern  with much room for improvement. 41-50 Dietary pattern unlikely to meet recommendations for good health and room for improvement. 51-60 More healthful dietary pattern, with some room for improvement.  >60 Healthy dietary pattern, although there may be some specific behaviors that could be improved.    Nutrition Goals Re-Evaluation:  Nutrition Goals Re-Evaluation     Row Name 01/22/22 1757             Goals   Nutrition Goal Patient has not met with the RD yet and has to reschedule the appointment with her and her daughter.                Nutrition Goals Discharge (Final Nutrition Goals Re-Evaluation):  Nutrition Goals Re-Evaluation - 01/22/22 1757       Goals   Nutrition Goal Patient has not met with the RD yet and has to reschedule the appointment with her and her daughter.             Psychosocial: Target Goals: Acknowledge presence or absence of significant depression and/or stress, maximize coping skills, provide positive support system. Participant is able to verbalize types and ability to use techniques and skills needed for reducing stress and depression.   Education: Stress, Anxiety, and Depression - Group verbal and visual presentation to define topics covered.  Reviews how body is impacted by stress, anxiety, and depression.  Also discusses healthy ways to reduce stress and to treat/manage anxiety and depression.  Written material given at graduation. Flowsheet Row Pulmonary Rehab from 03/21/2022 in [facility] Cardiac and Pulmonary Rehab  Date 03/07/22  Educator [facility]  Instruction Review Code 1- Verbalizes Understanding       Education: Sleep Hygiene -Provides group verbal and written instruction about how sleep can affect your health.  Define sleep hygiene, discuss sleep cycles and impact of sleep habits. Review good sleep hygiene tips.    Initial Review & Psychosocial Screening:  Initial Psych Review & Screening - 11/23/21 0909       Initial Review   Current issues with Current Psychotropic Meds      Family Dynamics   Good Support System? Yes    Comments She can look to her daughter who lives with her, sister, son and brother who lives in town. She has 4 grand children. 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      Barriers   Psychosocial  barriers to participate in program The patient should benefit from training in stress management and relaxation.      Screening Interventions   Interventions Encouraged to exercise;Program counselor consult;To provide support and resources with identified psychosocial needs;Provide feedback about the scores to participant    Expected Outcomes Short Term goal: Utilizing psychosocial counselor, staff and physician to assist with identification of specific Stressors or current issues interfering with healing process. Setting desired goal for each stressor or current issue identified.;Long Term Goal: Stressors or current issues are controlled or eliminated.;Short Term goal: Identification and review with participant of any Quality of Life or Depression concerns found by scoring the questionnaire.;Long Term goal: The participant improves quality of Life and PHQ9 Scores as seen by post scores and/or verbalization of changes             Quality of Life Scores:  Scores of 19 and below usually indicate a poorer quality of life in these areas.  A difference of  2-3 points is a clinically meaningful difference.  A difference of 2-3 points in the total score of the Quality  of Life Index has been associated with significant improvement in overall quality of life, self-image, physical symptoms, and general health in studies assessing change in quality of life.  PHQ-9: Review Flowsheet        01/18/2022 01/10/2022 12/18/2021 07/18/2021 01/14/2019  Depression screen PHQ 2/9  Decreased Interest 0 0 0 0 0  Down, Depressed, Hopeless 0 0 0 0 0  PHQ - 2 Score 0 0 0 0 0  Altered sleeping   0    Tired, decreased energy   3    Change in appetite   0    Feeling bad or failure about yourself    1    Trouble concentrating   1    Moving slowly or fidgety/restless   0    Suicidal thoughts   0    PHQ-9 Score   5    Difficult doing work/chores   Somewhat difficult        Multiple values from one day are sorted in  reverse-chronological order       Interpretation of Total Score  Total Score Depression Severity:  1-4 = Minimal depression, 5-9 = Mild depression, 10-14 = Moderate depression, 15-19 = Moderately severe depression, 20-27 = Severe depression   Psychosocial Evaluation and Intervention:  Psychosocial Evaluation - 11/23/21 0913       Psychosocial Evaluation & Interventions   Interventions Encouraged to exercise with the program and follow exercise prescription;Relaxation education;Stress management education    Comments She can look to her daughter who lives with her, sister, son and brother who lives in town. She has 4 grand children. 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    Expected Outcomes Short: Start [facility] to help with mood. Long: Maintain a healthy mental state.    Continue Psychosocial Services  Follow up required by staff             Psychosocial Re-Evaluation:  Psychosocial Re-Evaluation     Row Name 01/22/22 1759 02/19/22 1729 03/22/22 1748         Psychosocial Re-Evaluation   Current issues with History of Depression;Current Sleep Concerns History of Depression;Current Sleep Concerns None Identified     Comments Mariah Poole is doing well. Her son just had a kidney transplant and is doing well  which released some stress from her. She has good support from her daughter who comes with her to her rehab sessions. Her sleep has been "iffy" and is set up to complete a sleep study but she can't remember when the date is. Overall, she's been handling her depression and taking Duloxetine with no problems or concerns. She is enjoying the program so far. Mariah Poole's son continues to do well after kidney transplant. She says she feels more energetic in the morning and can do more.   She is taking medication as directed. [facility]. She is in high spirits and relies on God and her daughter for support.     Expected  Outcomes Short: Continue attendance with rehab Long: Continue to maintain positive attitude Short: conitnue to exercise to helo with sleep Long: maintain positive outlook --     Interventions Encouraged to attend Pulmonary Rehabilitation for the exercise -- Encouraged to attend Pulmonary Rehabilitation for the exercise     Continue Psychosocial Services  Follow up required by staff -- No Follow up required              Psychosocial Discharge (Final  Psychosocial Re-Evaluation):  Psychosocial Re-Evaluation - 03/22/22 1748       Psychosocial Re-Evaluation   Current issues with None Identified    Comments Mariah Poole [facility]. She is in high spirits and relies on God and her daughter for support.    Interventions Encouraged to attend Pulmonary Rehabilitation for the exercise    Continue Psychosocial Services  No Follow up required             Education: Education Goals: Education classes will be provided on a weekly basis, covering required topics. Participant will state understanding/return demonstration of topics presented.  Learning Barriers/Preferences:  Learning Barriers/Preferences - 11/23/21 0908       Learning Barriers/Preferences   Learning Barriers None    Learning Preferences None             General Pulmonary Education Topics:  Infection Prevention: - Provides verbal and written material to individual with discussion of infection control including proper hand washing and proper equipment cleaning during exercise session. Flowsheet Row Pulmonary Rehab from 03/21/2022 in [facility] Cardiac and Pulmonary Rehab  Date 12/18/21  Educator [facility]  Instruction Review Code 1- Verbalizes Understanding       Falls Prevention: - Provides verbal and written material to individual with discussion of falls prevention and safety. Flowsheet Row Pulmonary Rehab from 03/21/2022 in [facility] Cardiac and Pulmonary Rehab  Date  12/18/21  Educator [facility]  Instruction Review Code 1- Verbalizes Understanding       Chronic Lung Disease Review: - Group verbal instruction with posters, models, PowerPoint presentations and videos,  to review new updates, new respiratory medications, new advancements in procedures and treatments. Providing information on websites and "800" numbers for continued self-education. Includes information about supplement oxygen, available portable oxygen systems, continuous and intermittent flow rates, oxygen safety, concentrators, and Medicare reimbursement for oxygen. Explanation of Pulmonary Drugs, including class, frequency, complications, importance of spacers, rinsing mouth after steroid MDI's, and proper cleaning methods for nebulizers. Review of basic lung anatomy and physiology related to function, structure, and complications of lung disease. Review of risk factors. Discussion about methods for diagnosing sleep apnea and types of masks and machines for OSA. Includes a review of the use of types of environmental controls: home humidity, furnaces, filters, dust mite/pet prevention, HEPA vacuums. Discussion about weather changes, air quality and the benefits of nasal washing. Instruction on Warning signs, infection symptoms, calling MD promptly, preventive modes, and value of vaccinations. Review of effective airway clearance, coughing and/or vibration techniques. Emphasizing that all should Create an Action Plan. Written material given at graduation. Flowsheet Row Pulmonary Rehab from 03/21/2022 in [facility] Cardiac and Pulmonary Rehab  Education need identified 12/18/21  Date 02/28/22  Educator [facility]  Instruction Review Code 1- Verbalizes Understanding       AED/CPR: - Group verbal and written instruction with the use of models to demonstrate the basic use of the AED with the basic ABC's of resuscitation.    Anatomy and Cardiac Procedures: - Group verbal and visual presentation and models provide  information about basic cardiac anatomy and function. Reviews the testing methods done to diagnose heart disease and the outcomes of the test results. Describes the treatment choices: Medical Management, Angioplasty, or Coronary Bypass Surgery for treating various heart conditions including Myocardial Infarction, Angina, Valve Disease, and Cardiac Arrhythmias.  Written material given at graduation. Flowsheet Row Pulmonary Rehab from 03/21/2022 in [facility] Cardiac and Pulmonary Rehab  Date 01/24/22  Educator [facility]  Instruction Review Code 1- Verbalizes Understanding       Medication Safety: - Group verbal and visual instruction to review commonly prescribed medications for heart and lung disease. Reviews the medication, class of the drug, and side effects. Includes the steps to properly store meds and maintain the prescription regimen.  Written material given at graduation. Flowsheet Row Pulmonary Rehab from 03/21/2022 in [facility] Cardiac and Pulmonary Rehab  Date 02/14/22  Educator [facility]  Instruction Review Code 1- Verbalizes Understanding       Other: -Provides group and verbal instruction on various topics (see comments)   Knowledge Questionnaire Score:  Knowledge Questionnaire Score - 12/18/21 1558       Knowledge Questionnaire Score   Pre Score 15/18              Core Components/Risk Factors/Patient Goals at Admission:  Personal Goals and Risk Factors at Admission - 12/18/21 1556       Core Components/Risk Factors/Patient Goals on Admission    Weight Management Yes;Weight Loss    Intervention Weight Management: Develop a combined nutrition and exercise program designed to reach desired caloric intake, while maintaining appropriate intake of nutrient and fiber, sodium and fats, and appropriate energy expenditure required for the weight goal.;Weight Management: Provide education and appropriate resources to help participant work on and attain dietary goals.;Weight Management/Obesity:  Establish reasonable short term and long term weight goals.;Obesity: Provide education and appropriate resources to help participant work on and attain dietary goals.    Admit Weight 200 lb 11.2 oz (91 kg)    Goal Weight: Short Term 195 lb (88.5 kg)    Goal Weight: Long Term 190 lb (86.2 kg)    Expected Outcomes Short Term: Continue to assess and modify interventions until short term weight is achieved;Long Term: Adherence to nutrition and physical activity/exercise program aimed toward attainment of established weight goal;Understanding recommendations for meals to include 15-35% energy as protein, 25-35% energy from fat, 35-60% energy from carbohydrates, less than 200mg  of dietary cholesterol, 20-35 gm of total fiber daily;Understanding of distribution of calorie intake throughout the day with the consumption of 4-5 meals/snacks;Weight Loss: Understanding of general recommendations for a balanced deficit meal plan, which promotes 1-2 lb weight loss per week and includes a negative energy balance of [phone] kcal/d    Improve shortness of breath with ADL's Yes    Intervention Provide education, individualized exercise plan and daily activity instruction to help decrease symptoms of SOB with activities of daily living.    Expected Outcomes Short Term: Improve cardiorespiratory fitness to achieve a reduction of symptoms when performing ADLs;Long Term: Be able to perform more ADLs without symptoms or delay the onset of symptoms    Heart Failure Yes    Intervention Provide a combined exercise and nutrition program that is supplemented with education, support and counseling about heart failure. Directed toward relieving symptoms such as shortness of breath, decreased exercise tolerance, and extremity edema.    Expected Outcomes Improve functional capacity of life;Short term: Attendance in program 2-3 days a week with increased exercise capacity. Reported lower sodium intake. Reported increased fruit and  vegetable intake. Reports medication compliance.;Short term: Daily weights obtained and reported for increase. Utilizing diuretic protocols set by physician.;Long term: Adoption of self-care skills and reduction of barriers for early signs and symptoms recognition and intervention leading to self-care maintenance.    Hypertension Yes    Intervention Provide education on lifestyle modifcations including regular physical activity/exercise, weight management, moderate sodium restriction  and increased consumption of fresh fruit, vegetables, and low fat dairy, alcohol moderation, and smoking cessation.;Monitor prescription use compliance.    Expected Outcomes Short Term: Continued assessment and intervention until BP is < 140/76mm HG in hypertensive participants. < 130/41mm HG in hypertensive participants with diabetes, heart failure or chronic kidney disease.;Long Term: Maintenance of blood pressure at goal levels.    Lipids Yes    Intervention Provide education and support for participant on nutrition & aerobic/resistive exercise along with prescribed medications to achieve LDL 70mg , HDL >40mg .    Expected Outcomes Short Term: Participant states understanding of desired cholesterol values and is compliant with medications prescribed. Participant is following exercise prescription and nutrition guidelines.;Long Term: Cholesterol controlled with medications as prescribed, with individualized exercise RX and with personalized nutrition plan. Value goals: LDL < 70mg , HDL > 40 mg.             Education:Diabetes - Individual verbal and written instruction to review signs/symptoms of diabetes, desired ranges of glucose level fasting, after meals and with exercise. Acknowledge that pre and post exercise glucose checks will be done for 3 sessions at entry of program.   Know Your Numbers and Heart Failure: - Group verbal and visual instruction to discuss disease risk factors for cardiac and pulmonary disease  and treatment options.  Reviews associated critical values for Overweight/Obesity, Hypertension, Cholesterol, and Diabetes.  Discusses basics of heart failure: signs/symptoms and treatments.  Introduces Heart Failure Zone chart for action plan for heart failure.  Written material given at graduation. Flowsheet Row Pulmonary Rehab from 03/21/2022 in [facility]  Date 02/21/22  Educator [facility]  Instruction Review Code 1- Verbalizes Understanding       Core Components/Risk Factors/Patient Goals Review:   Goals and Risk Factor Review     Row Name 01/22/22 1753 02/19/22 1725 03/22/22 1747         Core Components/Risk Factors/Patient Goals Review   Personal Goals Review Weight Management/Obesity;Hypertension;Improve shortness of breath with ADL's Weight Management/Obesity;Hypertension;Improve shortness of breath with ADL's Improve shortness of breath with ADL's     Review Mariah Poole has been doing well. She lost weight and has lost 8 lbs since she started the program! She does not have a scale at home and plans to get one.She has a BP cuff at home but does not use it and I encouraged her to bring it with her and told her the staff can show her how to use it. She is taking all her medications as directed. She is starting to walk down to rehab and she feels she can do more at home, such as making the bed. She is pleased with her progress and breathing thus far. Mariah Poole is now checking BP and oxygen at home.  Both have been in normal limits.  She reports being able to do more at home without being excessivley fatigued. Her weight has leveled off some.  She can tell her clothers fit better.  She says she ate some fried fish and that may be why her weight is up today. Spoke to patient about their shortness of breath and what they can do to improve. Patient has been informed of breathing techniques when starting the program. Patient is informed to tell staff if they have had any med changes and that  certain meds they are taking or not taking can be causing shortness of breath.     Expected Outcomes Short: Continue with weight loss Long: Continue to manage lifestyle risk factors  Short: contiue to work on healthy eating Long: manage risk factors long term Short: Attend [facility] regularly to improve shortness of breath with ADL's. Long: maintain independence with ADL's              Core Components/Risk Factors/Patient Goals at Discharge (Final Review):   Goals and Risk Factor Review - 03/22/22 1747       Core Components/Risk Factors/Patient Goals Review   Personal Goals Review Improve shortness of breath with ADL's    Review Spoke to patient about their shortness of breath and what they can do to improve. Patient has been informed of breathing techniques when starting the program. Patient is informed to tell staff if they have had any med changes and that certain meds they are taking or not taking can be causing shortness of breath.    Expected Outcomes Short: Attend [facility] regularly to improve shortness of breath with ADL's. Long: maintain independence with ADL's             ITP Comments:  ITP Comments     Row Name 11/23/21 0906 12/18/21 1545 12/20/21 1652 01/10/22 0922 02/07/22 0918   ITP Comments Virtual Visit completed. Patient informed on EP and RD appointment and 6 Minute walk test. Patient also informed of patient health questionnaires on My Chart. Patient Verbalizes understanding. Visit diagnosis can be found in [facility] 10/31/[facility]. Completed and gym orientation. Initial ITP created and sent for review to Dr. Jinny Sanders, Medical Director. First full day of exercise!  Patient was oriented to gym and equipment including functions, settings, policies, and procedures.  Patient's individual exercise prescription and treatment plan were reviewed.  All starting workloads were established based on the results of the 6 minute walk test done at initial orientation visit.  The plan  for exercise progression was also introduced and progression will be customized based on patient's performance and goals. 30 Day review completed. Medical Director ITP review done, changes made as directed, and signed approval by Medical Director. 30 Day review completed. Medical Director ITP review done, changes made as directed, and signed approval by Medical Director.    Row Name 02/14/22 1702 03/07/22 1417 03/26/22 1730       ITP Comments Completed initial RD consultation 30 Day review completed. Medical Director ITP review done, changes made as directed, and signed approval by Medical Director. Mariah Poole graduated today from  rehab with 36 sessions completed.  Details of the patient's exercise prescription and what She needs to do in order to continue the prescription and progress were discussed with patient.  Patient was given a copy of prescription and goals.  Patient verbalized understanding.  Mariah Poole plans to continue to exercise by swimming at the [place] [facility].              Comments: Discharge ITP

## 2022-03-26 NOTE — Progress Notes (Signed)
Daily Session Note ? ?Patient Details  ?Name: Mariah Poole ?MRN: [id number] ?Date of Birth: 12/24/1945 ?Referring Provider:   ?Flowsheet Row Pulmonary Rehab from 12/18/2021 in [facility]  ?Referring Provider Dr. Michael Wert  ? ?  ? ? ?Encounter Date: 03/26/2022 ? ?Check In: ? Session Check In - 03/26/22 1728   ? ?  ? Check-In  ? Supervising physician immediately available to respond to emergencies See telemetry face sheet for immediately available ER MD   ? Location [facility]   ? Staff Present Susanne Bice, RN, BSN, CCRP;Melissa Caiola, RDN, LDN;Kelly Bollinger, MPA, RN   ? Virtual Visit No   ? Medication changes reported     No   ? Fall or balance concerns reported    No   ? Warm-up and Cool-down Performed on first and last piece of equipment   ? Resistance Training Performed Yes   ? VAD Patient? No   ? PAD/SET Patient? No   ?  ? Pain Assessment  ? Currently in Pain? No/denies   ? ?  ?  ? ?  ? ? ? ? ? ?Social History  ? ?Tobacco Use  ?Smoking Status Never  ?Smokeless Tobacco Never  ?Tobacco Comments  ? does not smoke   ? ? ?Goals Met:  ?Independence with exercise equipment ?Exercise tolerated well ?Personal goals reviewed ?No report of concerns or symptoms today ? ?Goals Unmet:  ?Not Applicable ? ?Comments:  Mariah Poole graduated today from  rehab with 36 sessions completed.  Details of the patient's exercise prescription and what She needs to do in order to continue the prescription and progress were discussed with patient.  Patient was given a copy of prescription and goals.  Patient verbalized understanding.  [name] plans to continue to exercise by swimming at the [place] [facility]. ? ? ? ?Dr. Mark Miller is Medical Director for [facility].  ?Dr. Fuad Aleskerov is Medical Director for [facility]. ?

## 2022-03-26 NOTE — Progress Notes (Signed)
DIscharge Note ? ?Mariah Poole graduated today from  rehab with 36 sessions completed.  Details of the patient's exercise prescription and what She needs to do in order to continue the prescription and progress were discussed with patient.  Patient was given a copy of prescription and goals.  Patient verbalized understanding.  Mariah Poole plans to continue to exercise by swimming at the [facility]. ? 6 Minute Walk   ? ? Row Name 12/18/21 1547 03/21/22 1840  ?  ?  ? 6 Minute Walk  ? Phase Initial Discharge   ? Distance 600 feet 750 feet   ? Distance % Change -- 25 %   ? Distance Feet Change -- 150 ft   ? Walk Time 6 minutes 6 minutes   ? # of Rest Breaks 0 0   ? MPH 1.14 1.42   ? METS 0.74 2.19   ? RPE 11 11   ? Perceived Dyspnea  1 1   ? VO2 Peak 2.58 7.68   ? Symptoms No No   ? Resting HR 82 bpm 100 bpm   ? Resting BP 104/60 122/62   ? Resting Oxygen Saturation  95 % 94 %   ? Exercise Oxygen Saturation  during 6 min walk 88 % 91 %   ? Max Ex. HR 110 bpm 148 bpm   ? Max Ex. BP 152/82 172/82   ? 2 Minute Post BP 124/80 126/72   ?  ? Interval HR  ? 1 Minute HR 94 115   ? 2 Minute HR 70 111   ? 3 Minute HR 61 140   ? 4 Minute HR 91 148   ? 5 Minute HR 71 148   ? 6 Minute HR 110 142   ? 2 Minute Post HR 89 117   ? Interval Heart Rate? Yes Yes   ?  ? Interval Oxygen  ? Interval Oxygen? Yes Yes   ? Baseline Oxygen Saturation % 95 % 94 %   ? 1 Minute Oxygen Saturation % 91 % 92 %   ? 1 Minute Liters of Oxygen 0 L 0 L  RA   ? 2 Minute Oxygen Saturation % 88 % 94 %   ? 2 Minute Liters of Oxygen 0 L 0 L   ? 3 Minute Oxygen Saturation % 88 % 94 %   ? 3 Minute Liters of Oxygen 0 L 0 L   ? 4 Minute Oxygen Saturation % 90 % 91 %   ? 4 Minute Liters of Oxygen 0 L 0 L   ? 5 Minute Oxygen Saturation % 90 % 92 %   ? 5 Minute Liters of Oxygen 0 L 0 L   ? 6 Minute Oxygen Saturation % 90 % 92 %   ? 6 Minute Liters of Oxygen 0 L 0 L   ? 2 Minute Post Oxygen Saturation % 94 % 95 %   ? 2 Minute Post Liters of Oxygen 0 L 0 L   ? ?  ?  ? ?  ? ?THank  you for the referral. We enjoyed working with Mariah Poole.  ?

## 2022-03-29 ENCOUNTER — Other Ambulatory Visit: Payer: Self-pay

## 2022-03-29 ENCOUNTER — Encounter: Payer: Self-pay | Admitting: Pain Medicine

## 2022-03-29 ENCOUNTER — Ambulatory Visit: Payer: Medicare PPO | Attending: Pain Medicine | Admitting: Pain Medicine

## 2022-03-29 VITALS — BP 104/84 | HR 85 | Temp 97.3°F | Resp 18 | Ht 60.0 in | Wt 195.0 lb

## 2022-03-29 DIAGNOSIS — G8929 Other chronic pain: Secondary | ICD-10-CM | POA: Diagnosis present

## 2022-03-29 DIAGNOSIS — M47816 Spondylosis without myelopathy or radiculopathy, lumbar region: Secondary | ICD-10-CM | POA: Diagnosis not present

## 2022-03-29 DIAGNOSIS — M542 Cervicalgia: Secondary | ICD-10-CM | POA: Diagnosis present

## 2022-03-29 DIAGNOSIS — M7918 Myalgia, other site: Secondary | ICD-10-CM

## 2022-03-29 DIAGNOSIS — M47812 Spondylosis without myelopathy or radiculopathy, cervical region: Secondary | ICD-10-CM | POA: Diagnosis present

## 2022-03-29 DIAGNOSIS — G894 Chronic pain syndrome: Secondary | ICD-10-CM

## 2022-03-29 DIAGNOSIS — M1611 Unilateral primary osteoarthritis, right hip: Secondary | ICD-10-CM | POA: Diagnosis present

## 2022-03-29 DIAGNOSIS — M48062 Spinal stenosis, lumbar region with neurogenic claudication: Secondary | ICD-10-CM

## 2022-03-29 DIAGNOSIS — Z79891 Long term (current) use of opiate analgesic: Secondary | ICD-10-CM | POA: Diagnosis present

## 2022-03-29 DIAGNOSIS — M0579 Rheumatoid arthritis with rheumatoid factor of multiple sites without organ or systems involvement: Secondary | ICD-10-CM | POA: Diagnosis present

## 2022-03-29 DIAGNOSIS — M545 Low back pain, unspecified: Secondary | ICD-10-CM

## 2022-03-29 DIAGNOSIS — M5136 Other intervertebral disc degeneration, lumbar region: Secondary | ICD-10-CM

## 2022-03-29 DIAGNOSIS — M25551 Pain in right hip: Secondary | ICD-10-CM

## 2022-03-29 DIAGNOSIS — M51369 Other intervertebral disc degeneration, lumbar region without mention of lumbar back pain or lower extremity pain: Secondary | ICD-10-CM

## 2022-03-29 DIAGNOSIS — Z79899 Other long term (current) drug therapy: Secondary | ICD-10-CM

## 2022-03-29 MED ORDER — HYDROCODONE-ACETAMINOPHEN 10-325 MG PO TABS: 1.0000 | ORAL_TABLET | Freq: Three times a day (TID) | ORAL | 0 refills | Status: DC

## 2022-03-29 NOTE — Progress Notes (Signed)
Nursing Pain Medication Assessment:  ?Safety precautions to be maintained throughout the outpatient stay will include: orient to surroundings, keep bed in low position, maintain call bell within reach at all times, provide assistance with transfer out of bed and ambulation.  ?Medication Inspection Compliance: Pill count conducted under aseptic conditions, in front of the patient. Neither the pills nor the bottle was removed from the patient's sight at any time. Once count was completed pills were immediately returned to the patient in their original bottle. ? ?Medication: Hydrocodone/APAP ?Pill/Patch Count:  42 of 90 pills remain ?Pill/Patch Appearance: Markings consistent with prescribed medication ?Bottle Appearance: Standard pharmacy container. Clearly labeled. ?Filled Date: 04 / 27 / 2023 ?Last Medication intake:  Today ?

## 2022-03-30 ENCOUNTER — Ambulatory Visit
Admission: RE | Admit: 2022-03-30 | Discharge: 2022-03-30 | Disposition: A | Discharge status: Home / Self Care | Payer: Medicare PPO | Source: Ambulatory Visit | Attending: Pain Medicine | Admitting: Pain Medicine

## 2022-03-30 ENCOUNTER — Ambulatory Visit
Admission: RE | Admit: 2022-03-30 | Discharge: 2022-03-30 | Disposition: A | Discharge status: Home / Self Care | Payer: Medicare PPO | Attending: Pain Medicine | Admitting: Pain Medicine

## 2022-03-30 DIAGNOSIS — M1611 Unilateral primary osteoarthritis, right hip: Secondary | ICD-10-CM

## 2022-03-30 DIAGNOSIS — M0579 Rheumatoid arthritis with rheumatoid factor of multiple sites without organ or systems involvement: Secondary | ICD-10-CM | POA: Diagnosis present

## 2022-03-30 DIAGNOSIS — M25551 Pain in right hip: Secondary | ICD-10-CM | POA: Insufficient documentation

## 2022-03-30 DIAGNOSIS — G8929 Other chronic pain: Secondary | ICD-10-CM | POA: Insufficient documentation

## 2022-05-14 ENCOUNTER — Telehealth: Payer: Self-pay | Admitting: Pain Medicine

## 2022-05-14 ENCOUNTER — Other Ambulatory Visit: Payer: Self-pay | Admitting: *Deleted

## 2022-05-14 DIAGNOSIS — M48062 Spinal stenosis, lumbar region with neurogenic claudication: Secondary | ICD-10-CM

## 2022-05-14 DIAGNOSIS — Z79891 Long term (current) use of opiate analgesic: Secondary | ICD-10-CM

## 2022-05-14 DIAGNOSIS — M47816 Spondylosis without myelopathy or radiculopathy, lumbar region: Secondary | ICD-10-CM

## 2022-05-14 DIAGNOSIS — M47812 Spondylosis without myelopathy or radiculopathy, cervical region: Secondary | ICD-10-CM

## 2022-05-14 DIAGNOSIS — M0579 Rheumatoid arthritis with rheumatoid factor of multiple sites without organ or systems involvement: Secondary | ICD-10-CM

## 2022-05-14 DIAGNOSIS — M5136 Other intervertebral disc degeneration, lumbar region: Secondary | ICD-10-CM

## 2022-05-14 DIAGNOSIS — Z79899 Other long term (current) drug therapy: Secondary | ICD-10-CM

## 2022-05-14 DIAGNOSIS — G894 Chronic pain syndrome: Secondary | ICD-10-CM

## 2022-05-14 DIAGNOSIS — G8929 Other chronic pain: Secondary | ICD-10-CM

## 2022-05-14 DIAGNOSIS — M542 Cervicalgia: Secondary | ICD-10-CM

## 2022-05-14 MED ORDER — HYDROCODONE-ACETAMINOPHEN 10-325 MG PO TABS: 1.0000 | ORAL_TABLET | Freq: Three times a day (TID) | ORAL | 0 refills | Status: DC

## 2022-05-14 NOTE — Telephone Encounter (Signed)
Patient called back and would like to have all scripts changed over to [facility] (formerly [facility]) says the pharmacy direct line is having problems getting calls thru. Gave me number to call with instructions.  [phone] choose option for appointments, and they will transfer you to the pharmacy.  Please let patient know when this is done so she can get her medications.

## 2022-05-14 NOTE — Telephone Encounter (Signed)
Patient is having more pain in her back. Says she has called and left previous msg. She had the xray done.  [facility] does not have her meds and she is going to call back with a different clinic.

## 2022-07-01 NOTE — Progress Notes (Unsigned)
PROVIDER NOTE: Information contained herein reflects review and annotations entered in association with encounter. Interpretation of such information and data should be left to medically-trained personnel. Information provided to patient can be located elsewhere in the medical record under "Patient Instructions". Document created using STT-dictation technology, any transcriptional errors that may result from process are unintentional.    Patient: Mariah Poole  Service Category: E/M  Provider: Gaspar Cola, MD  DOB: [birth year]  DOS: 07/04/2022  Referring Provider: Marguerita Merles, MD  MRN: [id number]  Specialty: Interventional Pain Management  PCP: Marguerita Merles, MD  Type: Established Patient  Setting: Ambulatory outpatient    Location: Office  Delivery: Face-to-face     HPI  Ms. [name], a 76 y.o. year old female, is here today because of her No primary diagnosis found.. Ms. Fadely's primary complain today is No chief complaint on file. Last encounter: My last encounter with her was on 05/14/2022. Pertinent problems: Ms. 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, L4-5, L5-S1) w/ neurogenic claudication; Lumbar foraminal stenosis (Multilevel) (Bilateral); Cervicalgia; DDD (degenerative disc disease), cervical; Cervical facet hypertrophy; Cervical facet joint syndrome; Lumbar facet hypertrophy; 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 Pain Assessment: Severity of   is reported as a  /10. Location:    / . Onset:  . Quality:  . Timing:  . Modifying factor(s):  Marland Kitchen Vitals:  vitals were not taken for this visit.   Reason for encounter:  *** . ***  Pharmacotherapy Assessment  Analgesic: Hydrocodone/APAP 10/325, 1 tab PO q 8 hrs PRN (30 mg/day of hydrocodone) (975 mg/day of acetaminophen) MME/day: 30 mg/day.   Monitoring: [place] PMP: PDMP reviewed during this encounter.       Pharmacotherapy: No side-effects or adverse reactions reported. Compliance: No problems identified. Effectiveness: Clinically acceptable.  No notes on file  UDS:  Summary  Date Value Ref Range Status  07/19/2021 Note  Final    Comment:    ==================================================================== [employer] (MW) ==================================================================== Test                             Result       Flag       Units  Drug Present and Declared for Prescription Verification   Hydrocodone                    696          EXPECTED   ng/mg creat   Hydromorphone                  42           EXPECTED   ng/mg creat   Dihydrocodeine  332          EXPECTED   ng/mg creat   Norhydrocodone                 1671         EXPECTED   ng/mg creat    Sources of hydrocodone include scheduled prescription medications.    Hydromorphone, dihydrocodeine and norhydrocodone are expected    metabolites of hydrocodone. Hydromorphone and dihydrocodeine are    also available as scheduled prescription medications.  ==================================================================== Test                       Result    Flag   Units      Ref Range   Creatinine              195              mg/dL      >=20 ==================================================================== Declared Medications:  The flagging and interpretation on this report are based on the  following declared medications.  Unexpected results may arise from  inaccuracies in the declared medications.   **Note: The testing scope of this panel includes these medications:   Hydrocodone (Norco)   **Note: The testing scope of this panel does not include the  following reported medications:   Acetaminophen (Norco)  Albuterol (Ventolin HFA)  Atenolol (Tenormin)  Budesonide (Symbicort)  Duloxetine (Cymbalta)  Formoterol (Symbicort)  Furosemide (Lasix)  Iron  Losartan (Cozaar)  Magnesium (Mag-Ox)  Meloxicam (Mobic)  Pantoprazole (Protonix)  Potassium (Klor-Con)  Pravastatin (Pravachol)  Pregabalin (Lyrica)  Spironolactone (Aldactone)  Turmeric  Vitamin C  Vitamin D3 ==================================================================== For clinical consultation, please call [phone]. ====================================================================      ROS  Constitutional: Denies any fever or chills Gastrointestinal: No reported hemesis, hematochezia, vomiting, or acute GI distress Musculoskeletal: Denies any acute onset joint swelling, redness, loss of ROM, or weakness Neurological: No reported episodes of acute onset apraxia, aphasia, dysarthria, agnosia, amnesia, paralysis, loss of coordination, or loss of consciousness  Medication Review  DULoxetine, HYDROcodone-acetaminophen, Turmeric Curcumin, Vitamin D3, albuterol, ascorbic acid, atenolol, budesonide-formoterol, famotidine, ferrous sulfate, furosemide, losartan, pantoprazole, potassium chloride SA, pravastatin, and spironolactone  History Review  Allergy: Ms. Happ is allergic to contrast media [iodinated contrast media], ioxaglate, and  shellfish allergy. Drug: Ms. Lazo  reports current drug use. Alcohol:  reports no history of alcohol use. Tobacco:  reports that she has never smoked. She has never used smokeless tobacco. Social: Ms. Pae  reports that she has never smoked. She has never used smokeless tobacco. She reports current drug use. She reports that she does not drink alcohol. Medical:  has a past medical history of Anemia, Arthritis, COPD (chronic obstructive pulmonary disease) ([place]), Depression, Diabetes mellitus without complication ([place]), GERD (gastroesophageal reflux disease), Hiatal hernia, History of contrast media allergy. (IVP dye) (09/01/2015), History of hiatal hernia, Hypercholesteremia, Hypertension, Morbid obesity ([place]), Scoliosis, and Shortness of breath dyspnea. Surgical: Ms. Adderly  has a past surgical history that includes Abdominal hysterectomy; Cataract extraction w/ intraocular lens implant (Right); Gastric bypass; Hernia repair; Cataract extraction w/PHACO (Left, 07/19/2015); Cholecystectomy; and Hernia repair. Family: family history includes Diabetes in her mother; Heart disease in her father.  Laboratory Chemistry Profile   Renal Lab Results  Component Value Date   BUN 14 09/14/2021   CREATININE 1.02 (H) 09/14/2021   BCR 21 06/17/2019   GFRAA 53 (L) 06/17/2019   GFRNONAA 57 (L) 09/14/2021    Hepatic Lab Results  Component Value Date   AST 19 09/14/2021   ALT 11 09/14/2021   ALBUMIN 3.8 09/14/2021   ALKPHOS 66 09/14/2021   LIPASE 19 10/27/2015    Electrolytes Lab Results  Component Value Date   NA 140 09/14/2021   K 4.3 09/14/2021   CL 102 09/14/2021   CALCIUM 9.3 09/14/2021   MG 1.9 06/17/2019    Bone Lab Results  Component Value Date   25OHVITD1 45 06/17/2019   25OHVITD2 <1.0 06/17/2019   25OHVITD3 45 06/17/2019    Inflammation (CRP: Acute Phase) (ESR: Chronic Phase) Lab Results  Component Value Date   CRP 7 06/17/2019   ESRSEDRATE 49 (H) 06/17/2019    LATICACIDVEN 4.0 (H) 11/23/2009         Note: Above Lab results reviewed.  Recent Imaging Review  DG HIP UNILAT W OR W/O PELVIS 2-3 VIEWS RIGHT CLINICAL DATA:  Right hip pain, arthralgia. Chronic right hip pain with sciatica. Pain radiates down right leg pain  EXAM: DG HIP (WITH OR WITHOUT PELVIS) 2-3V RIGHT  COMPARISON:  None Available.  FINDINGS: Moderate hip joint space narrowing. Femoral head is well seated. Minimal lateral acetabular spurring. No erosion, avascular necrosis, or bony destruction. No periosteal reaction. No fracture. Pubic rami are intact. Pubic symphysis and sacroiliac joints are congruent.  IMPRESSION: Mild-moderate right hip osteoarthritis.  Electronically Signed   By: Keith Rake M.D.   On: 04/02/2022 18:47 Note: Reviewed        Physical Exam  General appearance: Well nourished, well developed, and well hydrated. In no apparent acute distress Mental status: Alert, oriented x 3 (person, place, & time)       Respiratory: No evidence of acute respiratory distress Eyes: PERLA Vitals: There were no vitals taken for this visit. BMI: Estimated body mass index is 38.08 kg/m as calculated from the following:   Height as of 03/29/22: 5' (1.524 m).   Weight as of 03/29/22: 195 lb (88.5 kg). Ideal: Patient weight not recorded  Assessment   Diagnosis Status  No diagnosis found. Controlled Controlled Controlled   Updated Problems: No problems updated.  Plan of Care  Problem-specific:  No problem-specific Assessment & Plan notes found for this encounter.  Ms. LYNZE REDDY has a current medication list which includes the following long-term medication(s): albuterol, atenolol, duloxetine, famotidine, ferosul, furosemide, hydrocodone-acetaminophen, hydrocodone-acetaminophen, hydrocodone-acetaminophen, pantoprazole, potassium chloride sa, pravastatin, spironolactone, and symbicort.  Pharmacotherapy (Medications Ordered): No orders of the defined  types were placed in this encounter.  Orders:  No orders of the defined types were placed in this encounter.  Follow-up plan:   No follow-ups on file.     Interventional Therapies  Risk  Complexity Considerations:   Estimated body mass index is 37.89 kg/m as calculated from the following:   Height as of this encounter: 5' (1.524 m).   Weight as of this encounter: 194 lb (88 kg). No RFA until patient brings BMI down to less than 30 kg/m   Planned  Pending:      Under consideration:   Possible bilateral lumbar facet RFA #1 (on hold until BMI is brought down to 30 kg/m)   Completed:   Diagnostic bilateral lumbar facet MBB x3 (01/18/2022) (5/10 to 0/10) (100/100/75/75-100)    Therapeutic  Palliative (PRN) options:   Therapeutic/palliative bilateral lumbar facet MBB      Recent Visits No visits were found meeting these conditions. Showing recent visits within past 90 days and meeting all other requirements Future Appointments Date Type Provider  Dept  07/04/22 Appointment Milinda Pointer, MD [facility]  Showing future appointments within next 90 days and meeting all other requirements  I discussed the assessment and treatment plan with the patient. The patient was provided an opportunity to ask questions and all were answered. The patient agreed with the plan and demonstrated an understanding of the instructions.  Patient advised to call back or seek an in-person evaluation if the symptoms or condition worsens.  Duration of encounter: *** minutes.  Total time on encounter, as per AMA guidelines included both the face-to-face and non-face-to-face time personally spent by the physician and/or other qualified health care professional(s) on the day of the encounter (includes time in activities that require the physician or other qualified health care professional and does not include time in activities normally performed by clinical staff). Physician's time may  include the following activities when performed: preparing to see the patient (eg, review of tests, pre-charting review of records) obtaining and/or reviewing separately obtained history performing a medically appropriate examination and/or evaluation counseling and educating the patient/family/caregiver ordering medications, tests, or procedures referring and communicating with other health care professionals (when not separately reported) documenting clinical information in the electronic or other health record independently interpreting results (not separately reported) and communicating results to the patient/ family/caregiver care coordination (not separately reported)  Note by: Gaspar Cola, MD Date: 07/04/2022; Time: 6:10 PM

## 2022-07-04 ENCOUNTER — Encounter: Payer: Self-pay | Admitting: Pain Medicine

## 2022-07-04 ENCOUNTER — Ambulatory Visit: Payer: Medicare PPO | Attending: Pain Medicine | Admitting: Pain Medicine

## 2022-07-04 ENCOUNTER — Other Ambulatory Visit: Payer: Self-pay

## 2022-07-04 VITALS — BP 139/74 | HR 93 | Resp 18 | Ht 60.0 in | Wt 192.0 lb

## 2022-07-04 DIAGNOSIS — M48062 Spinal stenosis, lumbar region with neurogenic claudication: Secondary | ICD-10-CM | POA: Insufficient documentation

## 2022-07-04 DIAGNOSIS — M0579 Rheumatoid arthritis with rheumatoid factor of multiple sites without organ or systems involvement: Secondary | ICD-10-CM | POA: Insufficient documentation

## 2022-07-04 DIAGNOSIS — M47812 Spondylosis without myelopathy or radiculopathy, cervical region: Secondary | ICD-10-CM | POA: Insufficient documentation

## 2022-07-04 DIAGNOSIS — M25551 Pain in right hip: Secondary | ICD-10-CM | POA: Insufficient documentation

## 2022-07-04 DIAGNOSIS — M542 Cervicalgia: Secondary | ICD-10-CM | POA: Insufficient documentation

## 2022-07-04 DIAGNOSIS — G8929 Other chronic pain: Secondary | ICD-10-CM | POA: Insufficient documentation

## 2022-07-04 DIAGNOSIS — M7918 Myalgia, other site: Secondary | ICD-10-CM | POA: Insufficient documentation

## 2022-07-04 DIAGNOSIS — Z79899 Other long term (current) drug therapy: Secondary | ICD-10-CM | POA: Diagnosis present

## 2022-07-04 DIAGNOSIS — M5136 Other intervertebral disc degeneration, lumbar region: Secondary | ICD-10-CM | POA: Insufficient documentation

## 2022-07-04 DIAGNOSIS — M47816 Spondylosis without myelopathy or radiculopathy, lumbar region: Secondary | ICD-10-CM | POA: Insufficient documentation

## 2022-07-04 DIAGNOSIS — Z79891 Long term (current) use of opiate analgesic: Secondary | ICD-10-CM | POA: Insufficient documentation

## 2022-07-04 DIAGNOSIS — M545 Low back pain, unspecified: Secondary | ICD-10-CM | POA: Diagnosis present

## 2022-07-04 DIAGNOSIS — G894 Chronic pain syndrome: Secondary | ICD-10-CM | POA: Insufficient documentation

## 2022-07-04 MED ORDER — HYDROCODONE-ACETAMINOPHEN 10-325 MG PO TABS: 1.0000 | ORAL_TABLET | Freq: Three times a day (TID) | ORAL | 0 refills | Status: DC

## 2022-07-04 NOTE — Progress Notes (Signed)
Nursing Pain Medication Assessment:  Safety precautions to be maintained throughout the outpatient stay will include: orient to surroundings, keep bed in low position, maintain call bell within reach at all times, provide assistance with transfer out of bed and ambulation.  Medication Inspection Compliance: Pill count conducted under aseptic conditions, in front of the patient. Neither the pills nor the bottle was removed from the patient's sight at any time. Once count was completed pills were immediately returned to the patient in their original bottle.  Medication: Hydrocodone/APAP Pill/Patch Count:  25 of 90 pills remain Pill/Patch Appearance: Markings consistent with prescribed medication Bottle Appearance: Standard pharmacy container. Clearly labeled. Filled Date: 07 / 26 / 2023 Last Medication intake:  Today

## 2022-07-04 NOTE — Patient Instructions (Signed)

## 2022-07-08 LAB — TOXASSURE SELECT 13 (MW), URINE

## 2022-10-02 NOTE — Progress Notes (Unsigned)
PROVIDER NOTE: Information contained herein reflects review and annotations entered in association with encounter. Interpretation of such information and data should be left to medically-trained personnel. Information provided to patient can be located elsewhere in the medical record under "Patient Instructions". Document created using STT-dictation technology, any transcriptional errors that may result from process are unintentional.    Patient: Mariah Poole  Service Category: E/M  Provider: Gaspar Cola, MD  DOB: [birth year]  DOS: 10/03/2022  Referring Provider: Marguerita Merles, MD  MRN: [id number]  Specialty: Interventional Pain Management  PCP: Marguerita Merles, MD  Type: Established Patient  Setting: Ambulatory outpatient    Location: Office  Delivery: Face-to-face     HPI  [name], a 76 y.o. year old female, is here today because of her No primary diagnosis found.. [name]'s primary complain today is No chief complaint on file. Last encounter: My last encounter with her was on 07/04/2022. Pertinent problems: Ms. 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, L4-5, L5-S1) w/ neurogenic claudication; Lumbar foraminal stenosis (Multilevel) (Bilateral); Cervicalgia; DDD (degenerative disc disease), cervical; Cervical facet hypertrophy; Cervical facet joint syndrome; Lumbar facet hypertrophy; 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 Pain Assessment: Severity of   is reported as a  /10. Location:    / . Onset:  . Quality:  . Timing:  . Modifying factor(s):  Marland Kitchen Vitals:  vitals were not taken for this visit.   Reason for encounter: medication management. ***  Pharmacotherapy Assessment  Analgesic: Hydrocodone/APAP 10/325, 1 tab PO q 8 hrs PRN (30 mg/day of hydrocodone) (975 mg/day of acetaminophen) MME/day: 30 mg/day.   Monitoring: [place] PMP: PDMP reviewed during this encounter.       Pharmacotherapy: No side-effects or adverse reactions reported. Compliance: No problems identified. Effectiveness: Clinically acceptable.  No notes on file  No results found for: "CBDTHCR" No results found for: "D8THCCBX" No results found for: "D9THCCBX"  UDS:  Summary  Date Value Ref Range Status  07/04/2022 Note  Final    Comment:    ==================================================================== [facility] (MW) ==================================================================== Test                             Result       Flag       Units  Drug Present and Declared for Prescription Verification   Hydrocodone                    2513         EXPECTED   ng/mg creat   Hydromorphone                  290          EXPECTED   ng/mg creat   Dihydrocodeine  711          EXPECTED   ng/mg creat   Norhydrocodone                 >5000        EXPECTED   ng/mg creat    Sources of hydrocodone include scheduled prescription medications.    Hydromorphone, dihydrocodeine and norhydrocodone are expected    metabolites of hydrocodone. Hydromorphone and dihydrocodeine are    also available as scheduled  prescription medications.  ==================================================================== Test                      Result    Flag   Units      Ref Range   Creatinine              100              mg/dL      >=20 ==================================================================== Declared Medications:  The flagging and interpretation on this report are based on the  following declared medications.  Unexpected results may arise from  inaccuracies in the declared medications.   **Note: The testing scope of this panel includes these medications:   Hydrocodone (Norco)   **Note: The testing scope of this panel does not include the  following reported medications:   Acetaminophen (Norco)  Albuterol (Ventolin HFA)  Atenolol (Tenormin)  Budesonide (Symbicort)  Duloxetine (Cymbalta)  Famotidine (Pepcid)  Formoterol (Symbicort)  Furosemide (Lasix)  Iron  Losartan (Cozaar)  Pantoprazole (Protonix)  Potassium (Klor-Con)  Pravastatin (Pravachol)  Spironolactone (Aldactone)  Turmeric  Vitamin C  Vitamin D3 ==================================================================== For clinical consultation, please call [phone]. ====================================================================       ROS  Constitutional: Denies any fever or chills Gastrointestinal: No reported hemesis, hematochezia, vomiting, or acute GI distress Musculoskeletal: Denies any acute onset joint swelling, redness, loss of ROM, or weakness Neurological: No reported episodes of acute onset apraxia, aphasia, dysarthria, agnosia, amnesia, paralysis, loss of coordination, or loss of consciousness  Medication Review  DULoxetine, HYDROcodone-acetaminophen, Turmeric Curcumin, Vitamin D3, albuterol, ascorbic acid, atenolol, budesonide-formoterol, famotidine, ferrous sulfate, furosemide, losartan, pantoprazole, potassium chloride SA, pravastatin, and spironolactone  History Review  Allergy: Ms.  Mariah Poole is allergic to contrast media [iodinated contrast media], ioxaglate, and shellfish allergy. Drug: [name]  reports current drug use. Alcohol:  reports no history of alcohol use. Tobacco:  reports that she has never smoked. She has never used smokeless tobacco. Social: [name]  reports that she has never smoked. She has never used smokeless tobacco. She reports current drug use. She reports that she does not drink alcohol. Medical:  has a past medical history of Anemia, Arthritis, COPD (chronic obstructive pulmonary disease) ([place]), Depression, Diabetes mellitus without complication ([place]), GERD (gastroesophageal reflux disease), Hiatal hernia, History of contrast media allergy. (IVP dye) (09/01/2015), History of hiatal hernia, Hypercholesteremia, Hypertension, Morbid obesity ([place]), Scoliosis, and Shortness of breath dyspnea. Surgical: [name]  has a past surgical history that includes Abdominal hysterectomy; Cataract extraction w/ intraocular lens implant (Right); Gastric bypass; Hernia repair; Cataract extraction w/PHACO (Left, 07/19/2015); Cholecystectomy; and Hernia repair. Family: family history includes Diabetes in her mother; Heart disease in her father.  Laboratory Chemistry Profile   Renal Lab Results  Component Value Date   BUN 14 09/14/2021   CREATININE 1.02 (H) 09/14/2021   BCR 21 06/17/2019   GFRAA 53 (L) 06/17/2019   GFRNONAA 57 (L) 09/14/2021    Hepatic Lab Results  Component Value Date   AST  19 09/14/2021   ALT 11 09/14/2021   ALBUMIN 3.8 09/14/2021   ALKPHOS 66 09/14/2021   LIPASE 19 10/27/2015    Electrolytes Lab Results  Component Value Date   NA 140 09/14/2021   K 4.3 09/14/2021   CL 102 09/14/2021   CALCIUM 9.3 09/14/2021   MG 1.9 06/17/2019    Bone Lab Results  Component Value Date   25OHVITD1 45 06/17/2019   25OHVITD2 <1.0 06/17/2019   25OHVITD3 45 06/17/2019    Inflammation (CRP: Acute Phase) (ESR: Chronic Phase) Lab Results   Component Value Date   CRP 7 06/17/2019   ESRSEDRATE 49 (H) 06/17/2019   LATICACIDVEN 4.0 (H) 11/23/2009         Note: Above Lab results reviewed.  Recent Imaging Review  DG HIP UNILAT W OR W/O PELVIS 2-3 VIEWS RIGHT CLINICAL DATA:  Right hip pain, arthralgia. Chronic right hip pain with sciatica. Pain radiates down right leg pain  EXAM: DG HIP (WITH OR WITHOUT PELVIS) 2-3V RIGHT  COMPARISON:  None Available.  FINDINGS: Moderate hip joint space narrowing. Femoral head is well seated. Minimal lateral acetabular spurring. No erosion, avascular necrosis, or bony destruction. No periosteal reaction. No fracture. Pubic rami are intact. Pubic symphysis and sacroiliac joints are congruent.  IMPRESSION: Mild-moderate right hip osteoarthritis.  Electronically Signed   By: Keith Rake M.D.   On: 04/02/2022 18:47 Note: Reviewed        Physical Exam  General appearance: Well nourished, well developed, and well hydrated. In no apparent acute distress Mental status: Alert, oriented x 3 (person, place, & time)       Respiratory: No evidence of acute respiratory distress Eyes: PERLA Vitals: There were no vitals taken for this visit. BMI: Estimated body mass index is 37.5 kg/m as calculated from the following:   Height as of 07/04/22: 5' (1.524 m).   Weight as of 07/04/22: 192 lb (87.1 kg). Ideal: Patient weight not recorded  Assessment   Diagnosis Status  No diagnosis found. Controlled Controlled Controlled   Updated Problems: No problems updated.   Plan of Care  Problem-specific:  No problem-specific Assessment & Plan notes found for this encounter.  [name] has a current medication list which includes the following long-term medication(s): albuterol, atenolol, duloxetine, famotidine, ferosul, furosemide, hydrocodone-acetaminophen, hydrocodone-acetaminophen, hydrocodone-acetaminophen, pantoprazole, potassium chloride sa, pravastatin, spironolactone, and  symbicort.  Pharmacotherapy (Medications Ordered): No orders of the defined types were placed in this encounter.  Orders:  No orders of the defined types were placed in this encounter.  Follow-up plan:   No follow-ups on file.     Interventional Therapies  Risk  Complexity Considerations:   Estimated body mass index is 37.89 kg/m as calculated from the following:   Height as of this encounter: 5' (1.524 m).   Weight as of this encounter: 194 lb (88 kg). No RFA until patient brings BMI down to less than 30 kg/m   Planned  Pending:      Under consideration:   Possible bilateral lumbar facet RFA #1 (on hold until BMI is brought down to 30 kg/m)   Completed:   Diagnostic bilateral lumbar facet MBB x3 (01/18/2022) (5/10 to 0/10) (100/100/75/75-100)    Therapeutic  Palliative (PRN) options:   Therapeutic/palliative bilateral lumbar facet MBB       Recent Visits Date Type Provider Dept  07/04/22 Office Visit Milinda Pointer, MD [facility]  Showing recent visits within past 90 days and meeting all other requirements Future  Appointments Date Type Provider Dept  10/03/22 Appointment Milinda Pointer, MD [facility]  Showing future appointments within next 90 days and meeting all other requirements  I discussed the assessment and treatment plan with the patient. The patient was provided an opportunity to ask questions and all were answered. The patient agreed with the plan and demonstrated an understanding of the instructions.  Patient advised to call back or seek an in-person evaluation if the symptoms or condition worsens.  Duration of encounter: *** minutes.  Total time on encounter, as per AMA guidelines included both the face-to-face and non-face-to-face time personally spent by the physician and/or other qualified health care professional(s) on the day of the encounter (includes time in activities that require the physician or other qualified  health care professional and does not include time in activities normally performed by clinical staff). Physician's time may include the following activities when performed: preparing to see the patient (eg, review of tests, pre-charting review of records) obtaining and/or reviewing separately obtained history performing a medically appropriate examination and/or evaluation counseling and educating the patient/family/caregiver ordering medications, tests, or procedures referring and communicating with other health care professionals (when not separately reported) documenting clinical information in the electronic or other health record independently interpreting results (not separately reported) and communicating results to the patient/ family/caregiver care coordination (not separately reported)  Note by: Gaspar Cola, MD Date: 10/03/2022; Time: 3:46 PM

## 2022-10-03 ENCOUNTER — Ambulatory Visit: Payer: Medicare PPO | Attending: Pain Medicine | Admitting: Pain Medicine

## 2022-10-03 ENCOUNTER — Encounter: Payer: Self-pay | Admitting: Pain Medicine

## 2022-10-03 VITALS — BP 115/93 | HR 86 | Temp 97.3°F | Resp 16 | Ht 60.0 in | Wt 189.0 lb

## 2022-10-03 DIAGNOSIS — G894 Chronic pain syndrome: Secondary | ICD-10-CM | POA: Diagnosis not present

## 2022-10-03 DIAGNOSIS — M48062 Spinal stenosis, lumbar region with neurogenic claudication: Secondary | ICD-10-CM | POA: Diagnosis present

## 2022-10-03 DIAGNOSIS — M0579 Rheumatoid arthritis with rheumatoid factor of multiple sites without organ or systems involvement: Secondary | ICD-10-CM | POA: Diagnosis present

## 2022-10-03 DIAGNOSIS — M542 Cervicalgia: Secondary | ICD-10-CM | POA: Insufficient documentation

## 2022-10-03 DIAGNOSIS — Z79899 Other long term (current) drug therapy: Secondary | ICD-10-CM | POA: Insufficient documentation

## 2022-10-03 DIAGNOSIS — M5136 Other intervertebral disc degeneration, lumbar region: Secondary | ICD-10-CM | POA: Diagnosis not present

## 2022-10-03 DIAGNOSIS — M47812 Spondylosis without myelopathy or radiculopathy, cervical region: Secondary | ICD-10-CM | POA: Diagnosis present

## 2022-10-03 DIAGNOSIS — Z79891 Long term (current) use of opiate analgesic: Secondary | ICD-10-CM | POA: Diagnosis present

## 2022-10-03 DIAGNOSIS — M545 Low back pain, unspecified: Secondary | ICD-10-CM | POA: Insufficient documentation

## 2022-10-03 DIAGNOSIS — M47816 Spondylosis without myelopathy or radiculopathy, lumbar region: Secondary | ICD-10-CM | POA: Diagnosis present

## 2022-10-03 DIAGNOSIS — G8929 Other chronic pain: Secondary | ICD-10-CM | POA: Diagnosis present

## 2022-10-03 DIAGNOSIS — M7918 Myalgia, other site: Secondary | ICD-10-CM | POA: Diagnosis present

## 2022-10-03 DIAGNOSIS — M25551 Pain in right hip: Secondary | ICD-10-CM | POA: Diagnosis present

## 2022-10-03 DIAGNOSIS — M51369 Other intervertebral disc degeneration, lumbar region without mention of lumbar back pain or lower extremity pain: Secondary | ICD-10-CM

## 2022-10-03 MED ORDER — HYDROCODONE-ACETAMINOPHEN 10-325 MG PO TABS: 1.0000 | ORAL_TABLET | Freq: Three times a day (TID) | ORAL | 0 refills | Status: DC

## 2022-10-03 MED ORDER — NALOXONE HCL 4 MG/0.1ML NA LIQD: 1.0000 | NASAL | 0 refills | Status: DC | PRN

## 2022-10-03 NOTE — Progress Notes (Signed)
Nursing Pain Medication Assessment:  Safety precautions to be maintained throughout the outpatient stay will include: orient to surroundings, keep bed in low position, maintain call bell within reach at all times, provide assistance with transfer out of bed and ambulation.  Medication Inspection Compliance: Pill count conducted under aseptic conditions, in front of the patient. Neither the pills nor the bottle was removed from the patient's sight at any time. Once count was completed pills were immediately returned to the patient in their original bottle.  Medication: Hydrocodone/APAP Pill/Patch Count:  34 of 90 pills remain Pill/Patch Appearance: Markings consistent with prescribed medication Bottle Appearance: Standard pharmacy container. Clearly labeled. Filled Date: 10 / 24 / 2023 Last Medication intake:  Today

## 2022-10-03 NOTE — Patient Instructions (Signed)
____________________________________________________________________________________________  Patient Information update  To: All of our patients.  Re: Name change.  It has been made official that our current name, "[facility]"   will soon be changed to "[facility]".   The purpose of this change is to eliminate any confusion created by the concept of our practice being a "Medication Management Pain Clinic". In the past this has led to the misconception that we treat pain primarily by the use of prescription medications.  Nothing can be farther from the truth.   Understanding PAIN MANAGEMENT: To further understand what our practice does, you first have to understand that "Pain Management" is a subspecialty that requires additional training once a physician has completed their specialty training, which can be in either Anesthesia, Neurology, Psychiatry, or Physical Medicine and Rehabilitation (PMR). Each one of these contributes to the final approach taken by each physician to the management of their patient's pain. To be a "Pain Management Specialist" you must have first completed one of the specialty trainings below.  Anesthesiologists - trained in clinical pharmacology and interventional techniques such as nerve blockade and regional as well as central neuroanatomy. They are trained to block pain before, during, and after surgical interventions.  Neurologists - trained in the diagnosis and pharmacological treatment of complex neurological conditions, such as Multiple Sclerosis, Parkinson's, spinal cord injuries, and other systemic conditions that may be associated with symptoms that may include but are not limited to pain. They tend to rely primarily on the treatment of chronic pain using prescription medications.  Psychiatrist - trained in conditions affecting the psychosocial  wellbeing of patients including but not limited to depression, anxiety, schizophrenia, personality disorders, addiction, and other substance use disorders that may be associated with chronic pain. They tend to rely primarily on the treatment of chronic pain using prescription medications.   Physical Medicine and Rehabilitation (PMR) physicians, also known as physiatrists - trained to treat a wide variety of medical conditions affecting the brain, spinal cord, nerves, bones, joints, ligaments, muscles, and tendons. Their training is primarily aimed at treating patients that have suffered injuries that have caused severe physical impairment. Their training is primarily aimed at the physical therapy and rehabilitation of those patients. They may also work alongside orthopedic surgeons or neurosurgeons using their expertise in assisting surgical patients to recover after their surgeries.  INTERVENTIONAL PAIN MANAGEMENT is sub-subspecialty of Pain Management.  Our physicians are Board-certified in Anesthesia, Pain Management, and Interventional Pain Management.  This meaning that not only have they been trained and Board-certified in their specialty of Anesthesia, and subspecialty of Pain Management, but they have also received further training in the sub-subspecialty of Interventional Pain Management, in order to become Board-certified as INTERVENTIONAL PAIN MANAGEMENT SPECIALIST.    Mission: Our goal is to use our skills in  INTERVENTIONAL PAIN MANAGEMENT as alternatives to the chronic use of prescription opioid medications for the treatment of pain. To make this more clear, we have changed our name to reflect what we do and offer. We will continue to offer medication management assessment and recommendations, but we will not be taking over any patient's medication management.  ____________________________________________________________________________________________      _______________________________________________________________________  Medication Rules  Purpose: To inform patients, and their family members, of our medication rules and regulations.  Applies to: All patients receiving prescriptions from our practice (written or electronic).  Pharmacy of record: This is the pharmacy where your electronic prescriptions   will be sent. Make sure we have the correct one.  Electronic prescriptions: In compliance with the  Strengthen Opioid Misuse Prevention (STOP) Act of 2017 (Session Law 2017-74/H243), effective November 19, 2018, all controlled substances must be electronically prescribed. Written prescriptions, faxing, or calling prescriptions to a pharmacy will no longer be done.  Prescription refills: These will be provided only during in-person appointments. No medications will be renewed without a "face-to-face" evaluation with your provider. Applies to all prescriptions.  NOTE: The following applies primarily to controlled substances (Opioid* Pain Medications).   Type of encounter (visit): For patients receiving controlled substances, face-to-face visits are required. (Not an option and not up to the patient.)  Patient's responsibilities: Pain Pills: Bring all pain pills to every appointment (except for procedure appointments). Pill Bottles: Bring pills in original pharmacy bottle. Bring bottle, even if empty. Always bring the bottle of the most recent fill.  Medication refills: You are responsible for knowing and keeping track of what medications you are taking and when is it that you will need a refill. The day before your appointment: write a list of all prescriptions that need to be refilled. The day of the appointment: give the list to the admitting nurse. Prescriptions will be written only during appointments. No prescriptions will be written on procedure days. If you forget a medication: it will not be "Called in", "Faxed", or  "electronically sent". You will need to get another appointment to get these prescribed. No early refills. Do not call asking to have your prescription filled early. Partial  or short prescriptions: Occasionally your pharmacy may not have enough pills to fill your prescription.  NEVER ACCEPT a partial fill or a prescription that is short of the total amount of pills that you were prescribed.  With controlled substances the law allows 72 hours for the pharmacy to complete the prescription.  If the prescription is not completed within 72 hours, the pharmacist will require a new prescription to be written. This means that you will be short on your medicine and we WILL NOT send another prescription to complete your original prescription.  Instead, request the pharmacy to send a carrier to a nearby branch to get enough medication to provide you with your full prescription. Prescription Accuracy: You are responsible for carefully inspecting your prescriptions before leaving our office. Have the discharge nurse carefully go over each prescription with you, before taking them home. Make sure that your name is accurately spelled, that your address is correct. Check the name and dose of your medication to make sure it is accurate. Check the number of pills, and the written instructions to make sure they are clear and accurate. Make sure that you are given enough medication to last until your next medication refill appointment. Taking Medication: Take medication as prescribed. When it comes to controlled substances, taking less pills or less frequently than prescribed is permitted and encouraged. Never take more pills than instructed. Never take the medication more frequently than prescribed.  Inform other Doctors: Always inform, all of your healthcare providers, of all the medications you take. Pain Medication from other Providers: You are not allowed to accept any additional pain medication from any other Doctor or  Healthcare provider. There are two exceptions to this rule. (see below) In the event that you require additional pain medication, you are responsible for notifying us, as stated below. Cough Medicine: Often these contain an opioid, such as codeine or hydrocodone. Never accept or take cough medicine containing   these opioids if you are already taking an opioid* medication. The combination may cause respiratory failure and death. Medication Agreement: You are responsible for carefully reading and following our Medication Agreement. This must be signed before receiving any prescriptions from our practice. Safely store a copy of your signed Agreement. Violations to the Agreement will result in no further prescriptions. (Additional copies of our Medication Agreement are available upon request.) Laws, Rules, & Regulations: All patients are expected to follow all Federal and State Laws, Statutes, Rules, & Regulations. Ignorance of the Laws does not constitute a valid excuse.  Illegal drugs and Controlled Substances: The use of illegal substances (including, but not limited to marijuana and its derivatives) and/or the illegal use of any controlled substances is strictly prohibited. Violation of this rule may result in the immediate and permanent discontinuation of any and all prescriptions being written by our practice. The use of any illegal substances is prohibited. Adopted CDC guidelines & recommendations: Target dosing levels will be at or below 60 MME/day. Use of benzodiazepines** is not recommended.  Exceptions: There are only two exceptions to the rule of not receiving pain medications from other Healthcare Providers. Exception #1 (Emergencies): In the event of an emergency (i.e.: accident requiring emergency care), you are allowed to receive additional pain medication. However, you are responsible for: As soon as you are able, call our office [phone], at any time of the day or night, and leave a  message stating your name, the date and nature of the emergency, and the name and dose of the medication prescribed. In the event that your call is answered by a member of our staff, make sure to document and save the date, time, and the name of the person that took your information.  Exception #2 (Planned Surgery): In the event that you are scheduled by another doctor or dentist to have any type of surgery or procedure, you are allowed (for a period no longer than 30 days), to receive additional pain medication, for the acute post-op pain. However, in this case, you are responsible for picking up a copy of our "Post-op Pain Management for Surgeons" handout, and giving it to your surgeon or dentist. This document is available at our office, and does not require an appointment to obtain it. Simply go to our office during business hours (Monday-Thursday from 8:00 AM to 4:00 PM) (Friday 8:00 AM to 12:00 Noon) or if you have a scheduled appointment with us, prior to your surgery, and ask for it by name. In addition, you are responsible for: calling our office [phone], at any time of the day or night, and leaving a message stating your name, name of your surgeon, type of surgery, and date of procedure or surgery. Failure to comply with your responsibilities may result in termination of therapy involving the controlled substances. Medication Agreement Violation. Following the above rules, including your responsibilities will help you in avoiding a Medication Agreement Violation ("Breaking your Pain Medication Contract").  Consequences:  Not following the above rules may result in permanent discontinuation of medication prescription therapy.  *Opioid medications include: morphine, codeine, oxycodone, oxymorphone, hydrocodone, hydromorphone, meperidine, tramadol, tapentadol, buprenorphine, fentanyl, methadone. **Benzodiazepine medications include: diazepam (Valium), alprazolam (Xanax), clonazepam (Klonopine),  lorazepam (Ativan), clorazepate (Tranxene), chlordiazepoxide (Librium), estazolam (Prosom), oxazepam (Serax), temazepam (Restoril), triazolam (Halcion) (Last updated: 09/11/2022) ______________________________________________________________________    ______________________________________________________________________  Medication Recommendations and Reminders  Applies to: All patients receiving prescriptions (written and/or electronic).  Medication Rules & Regulations: You are responsible   for reading, knowing, and following our "Medication Rules" document. These exist for your safety and that of others. They are not flexible and neither are we. Dismissing or ignoring them is an act of "non-compliance" that may result in complete and irreversible termination of such medication therapy. For safety reasons, "non-compliance" will not be tolerated. As with the [place] fundamental legal principle of "ignorance of the law is no defense", we will accept no excuses for not having read and knowing the content of documents provided to you by our practice.  Pharmacy of record:  Definition: This is the pharmacy where your electronic prescriptions will be sent.  We do not endorse any particular pharmacy. It is up to you and your insurance to decide what pharmacy to use.  We do not restrict you in your choice of pharmacy. However, once we write for your prescriptions, we will NOT be re-sending more prescriptions to fix restricted supply problems created by your pharmacy, or your insurance.  The pharmacy listed in the electronic medical record should be the one where you want electronic prescriptions to be sent. If you choose to change pharmacy, simply notify our nursing staff. Changes will be made only during your regular appointments and not over the phone.  Recommendations: Keep all of your pain medications in a safe place, under lock and key, even if you live alone. We will NOT replace lost, stolen, or  damaged medication. We do not accept "Police Reports" as proof of medications having been stolen. After you fill your prescription, take 1 week's worth of pills and put them away in a safe place. You should keep a separate, properly labeled bottle for this purpose. The remainder should be kept in the original bottle. Use this as your primary supply, until it runs out. Once it's gone, then you know that you have 1 week's worth of medicine, and it is time to come in for a prescription refill. If you do this correctly, it is unlikely that you will ever run out of medicine. To make sure that the above recommendation works, it is very important that you make sure your medication refill appointments are scheduled at least 1 week before you run out of medicine. To do this in an effective manner, make sure that you do not leave the office without scheduling your next medication management appointment. Always ask the nursing staff to show you in your prescription , when your medication will be running out. Then arrange for the receptionist to get you a return appointment, at least 7 days before you run out of medicine. Do not wait until you have 1 or 2 pills left, to come in. This is very poor planning and does not take into consideration that we may need to cancel appointments due to bad weather, sickness, or emergencies affecting our staff. DO NOT ACCEPT A "Partial Fill": If for any reason your pharmacy does not have enough pills/tablets to completely fill or refill your prescription, do not allow for a "partial fill". The law allows the pharmacy to complete that prescription within 72 hours, without requiring a new prescription. If they do not fill the rest of your prescription within those 72 hours, you will need a separate prescription to fill the remaining amount, which we will NOT provide. If the reason for the partial fill is your insurance, you will need to talk to the pharmacist about payment alternatives for  the remaining tablets, but again, DO NOT ACCEPT A PARTIAL FILL, unless you can trust   your pharmacist to obtain the remainder of the pills within 72 hours.  Prescription refills and/or changes in medication(s):  Prescription refills, and/or changes in dose or medication, will be conducted only during scheduled medication management appointments. (Applies to both, written and electronic prescriptions.) No refills on procedure days. No medication will be changed or started on procedure days. No changes, adjustments, and/or refills will be conducted on a procedure day. Doing so will interfere with the diagnostic portion of the procedure. No phone refills. No medications will be "called into the pharmacy". No Fax refills. No weekend refills. No Holliday refills. No after hours refills.  Remember:  Business hours are:  Monday to Thursday 8:00 AM to 4:00 PM Provider's Schedule: Natassia Guthridge, MD - Appointments are:  Medication management: Monday and Wednesday 8:00 AM to 4:00 PM Procedure day: Tuesday and Thursday 7:30 AM to 4:00 PM Bilal Lateef, MD - Appointments are:  Medication management: Tuesday and Thursday 8:00 AM to 4:00 PM Procedure day: Monday and Wednesday 7:30 AM to 4:00 PM (Last update: 09/11/2022) ______________________________________________________________________    ____________________________________________________________________________________________  Pharmacy Shortages of Pain Medication   Introduction Shockingly as it may seem, .  "No U.[place] Supreme Court decision has ever interpreted the Constitution as guaranteeing a right to health care for all Americans." - https://www.healthequityandpolicylab.com/elusive-right-to-health-care-under-us-law  "With respect to human rights, the [place] has no formally codified right to health, nor does it participate in a human rights treaty that specifies a right to health." - Scott J. Schweikart, JD,  MBE  Situation By now, most of our patients have had the experience of being told by their pharmacist that they do not have enough medication to cover their prescription. If you have not had this experience, just know that you soon will.  Problem There appears to be a shortage of these medications, either at the national level or locally. This is happening with all pharmacies. When there is not enough medication, patients are offered a partial fill and they are told that they will try to get the rest of the medicine for them at a later time. If they do not have enough for even a partial fill, the pharmacists are telling the patients to call us (the prescribing physicians) to request that we send another prescription to another pharmacy to get the medicine.   This reordering of a controlled substance creates documentation problems where additional paperwork needs to be created to explain why two prescriptions for the same period of time and the same medicine are being prescribed to the same patient. It also creates situations where the last appointment note does not accurately reflect when and what prescriptions were given to a patient. This leads to prescribing errors down the line, in subsequent follow-up visits.   [place] [employer] ([place]BOP) Research revealed that Board of Pharmacy Rule .1806 (21 NCAC 46.1806) authorizes pharmacists to the transfer of prescriptions among pharmacies, and it sets forth procedural and recordkeeping requirements for doing so. However, this requires the pharmacist to complete the previously mentioned procedural paperwork to accomplish the transfer. As it turns out, it is much easier for them to have the prescribing physicians do the work.   Possible solutions 1. You can ask your physician to assist you in weaning yourself off these medications. 2. Ask your pharmacy if the medication is in stock, 3 days prior to your refill. 3. If you need a pharmacy change,  let us know at your medication management visit. Prescriptions that have already been   electronically sent to a pharmacy will not be re-sent to a different pharmacy if your pharmacy of record does not have it in stock. Proper stocking of medication is a pharmacy problem, not a prescriber problem. Work with your pharmacist to solve the problem. 4. Have the [place] State Assembly add a provision to the "STOP ACT" (the law that mandates how controlled substances are prescribed) where there is an exception to the electronic prescribing rule that states that in the event there are shortages of medications the physicians are allowed to use written prescriptions as opposed to electronic ones. This would allow patients to take their prescriptions to a different pharmacy that may have enough medication available to fill the prescription. The problem is that currently there is a law that does not allow for written prescriptions, with the exception of instances where the electronic medical record is down due to technical issues.  5. Have [place] Congress ease the pressure on pharmaceutical companies, allowing them to produce enough quantities of the medication to adequately supply the population. 6. Have pharmacies keep enough stocks of these medications to cover their client base.  7. Have the [place] State Assembly add a provision to the "STOP ACT" where they ease the regulations surrounding the transfer of controlled substances between pharmacies, so as to simplify the transfer of supplies. As an alternative, develop a system to allow patients to obtain the remainder of their prescription at another one of their pharmacies or at an associate pharmacy.   How this shortage will affect you.  Understand that this is a pharmacy supply problem, not a prescriber problem. Work with your pharmacy to solve it. The job of the prescriber is to evaluate and monitor the patient for the appropriate indications and use of  these medicines. It is not the job of the prescriber to supply the medication or to solve problems with that supply. The responsibility and the choice to obtain the medication resides on the patient. By law, supplying the medication is the job of the pharmacy. It is certainly not the job of the prescriber to solve supply problems.   Due to the above problems we are no longer taking patients to write for their pain medication. Future discussions with your physician may include potentially weaning medications or transitioning to alternatives.  We will be focusing primarily on interventional based pain management. We will continue to evaluate for appropriate indications and we may provide recommendations regarding medication, dose, and schedule, as well as monitoring recommendations, however, we will not be taking over the actual prescribing of these substances. On those patients where we are treating their chronic pain with interventional therapies, exceptions will be considered on a case by case basis. At this time, we will try to continue providing this supplemental service to those patients we have been managing in the past. However, as of August 1st, 2023, we no longer will be sending additional prescriptions to other pharmacies for the purpose of solving their supply problems. Once we send a prescription to a pharmacy, we will not be resending it again to another pharmacy to cover for their shortages.   What to do. Write as many letters as you can. Recruit the help of family members in writing these letters. Below are some of the places where you can write to make your voice heard. Let them know what the problem is and push them to look for solutions.   Search internet for: "[place] find your legislators" https://www.ncleg.gov/findyourlegislators  Search   internet for: "[place] insurance commissioner  complaints" https://www.ncdoi.gov/contactscomplaints/assistance-or-file-complaint  Search internet for: "[place] Board of Pharmacy complaints" http://www.ncbop.org/contact.htm  Search internet for: "[facility] pharmacy complaints" Email [facility] Customer Relations https://www.[facility].com/help/email-[email or web address].jsp?callType=store  Search internet for: "[facility] pharmacy customer service complaints" https://www.[facility].com/topic/marketing/contactus/contactus_customerservice.jsp  ____________________________________________________________________________________________     ____________________________________________________________________________________________  Drug Holidays ([place])  What is a "Drug Holiday"? Drug Holiday: is the name given to the period of time during which a patient stops taking a medication(s) for the purpose of eliminating tolerance to the drug.  Benefits Improved effectiveness of opioids. Decreased opioid dose needed to achieve benefits. Improved pain with lesser dose.  What is tolerance? Tolerance: is the progressive decreased in effectiveness of a drug due to its repetitive use. With repetitive use, the body gets use to the medication and as a consequence, it loses its effectiveness. This is a common problem seen with opioid pain medications. As a result, a larger dose of the drug is needed to achieve the same effect that used to be obtained with a smaller dose.  How long should a "Drug Holiday" last? You should stay off of the pain medicine for at least 14 consecutive days. (2 weeks)  Should I stop the medicine "cold turkey"? No. You should always coordinate with your Pain Specialist so that he/she can provide you with the correct medication dose to make the transition as smoothly as possible.  How do I stop the medicine? Slowly. You will be instructed to decrease the daily amount of pills that you take by one (1) pill every seven (7) days.  This is called a "slow downward taper" of your dose. For example: if you normally take four (4) pills per day, you will be asked to drop this dose to three (3) pills per day for seven (7) days, then to two (2) pills per day for seven (7) days, then to one (1) per day for seven (7) days, and at the end of those last seven (7) days, this is when the "Drug Holiday" would start.   Will I have withdrawals? By doing a "slow downward taper" like this one, it is unlikely that you will experience any significant withdrawal symptoms. Typically, what triggers withdrawals is the sudden stop of a high dose opioid therapy. Withdrawals can usually be avoided by slowly decreasing the dose over a prolonged period of time. If you do not follow these instructions and decide to stop your medication abruptly, withdrawals may be possible.  What are withdrawals? Withdrawals: refers to the wide range of symptoms that occur after stopping or dramatically reducing opiate drugs after heavy and prolonged use. Withdrawal symptoms do not occur to patients that use low dose opioids, or those who take the medication sporadically. Contrary to benzodiazepine (example: Valium, Xanax, etc.) or alcohol withdrawals ("Delirium Tremens"), opioid withdrawals are not lethal. Withdrawals are the physical manifestation of the body getting rid of the excess receptors.  Expected Symptoms Early symptoms of withdrawal may include: Agitation Anxiety Muscle aches Increased tearing Insomnia Runny nose Sweating Yawning  Late symptoms of withdrawal may include: Abdominal cramping Diarrhea Dilated pupils Goose bumps Nausea Vomiting  Will I experience withdrawals? Due to the slow nature of the taper, it is very unlikely that you will experience any.  What is a slow taper? Taper: refers to the gradual decrease in dose.  (Last update:  06/08/2020) ____________________________________________________________________________________________    ____________________________________________________________________________________________  CBD (cannabidiol) & Delta-8 (Delta-8 tetrahydrocannabinol) WARNING  Intro: Cannabidiol (CBD) and tetrahydrocannabinol (THC), are two natural compounds found in   plants of the Cannabis genus. They can both be extracted from hemp or cannabis. Hemp and cannabis come from the Cannabis sativa plant. Both compounds interact with your body's endocannabinoid system, but they have very different effects. CBD does not produce the high sensation associated with cannabis. Delta-8 tetrahydrocannabinol, also known as delta-8 THC, is a psychoactive substance found in the Cannabis sativa plant, of which marijuana and hemp are two varieties. THC is responsible for the high associated with the illicit use of marijuana.  Applicable to: All individuals currently taking or considering taking CBD (cannabidiol) and, more important, all patients taking opioid analgesic controlled substances (pain medication). (Example: oxycodone; oxymorphone; hydrocodone; hydromorphone; morphine; methadone; tramadol; tapentadol; fentanyl; buprenorphine; butorphanol; dextromethorphan; meperidine; codeine; etc.)  Legal status: CBD remains a Schedule I drug prohibited for any use. CBD is illegal with one exception. In the [place], CBD has a limited Food and Drug Administration (FDA) approval for the treatment of two specific types of epilepsy disorders. Only one CBD product has been approved by the FDA for this purpose: "Epidiolex". FDA is aware that some companies are marketing products containing cannabis and cannabis-derived compounds in ways that violate the Federal Food, Drug and Cosmetic Act (FD&C Act) and that may put the health and safety of consumers at risk. The FDA, a Federal agency, has not enforced the CBD status since 2018.  UPDATE: (01/05/2022) The Drug Enforcement Agency (DEA) issued a letter stating that "delta" cannabinoids, including Delta-8-THCO and Delta-9-THCO, synthetically derived from hemp do not qualify as hemp and will be viewed as Schedule I drugs. (Schedule I drugs, substances, or chemicals are defined as drugs with no currently accepted medical use and a high potential for abuse. Some examples of Schedule I drugs are: heroin, lysergic acid diethylamide (LSD), marijuana (cannabis), 3,4-methylenedioxymethamphetamine (ecstasy), methaqualone, and peyote.) (https://www.dea.gov)  Legality: Some manufacturers ship CBD products nationally, which is illegal. Often such products are sold online and are therefore available throughout the country. CBD is openly sold in head shops and health food stores in some states where such sales have not been explicitly legalized. Selling unapproved products with unsubstantiated therapeutic claims is not only a violation of the law, but also can put patients at risk, as these products have not been proven to be safe or effective. Federal illegality makes it difficult to conduct research on CBD.  Reference: "FDA Regulation of Cannabis and Cannabis-Derived Products, Including Cannabidiol (CBD)" - https://www.fda.gov/news-events/public-health-focus/fda-regulation-cannabis-and-cannabis-derived-products-including-cannabidiol-cbd  Warning: CBD is not FDA approved and has not undergo the same manufacturing controls as prescription drugs.  This means that the purity and safety of available CBD may be questionable. Most of the time, despite manufacturer's claims, it is contaminated with THC (delta-9-tetrahydrocannabinol - the chemical in marijuana responsible for the "HIGH").  When this is the case, the THC contaminant will trigger a positive urine drug screen (UDS) test for Marijuana (carboxy-THC). Because a positive UDS for any illicit substance is a violation of our medication agreement, your  opioid analgesics (pain medicine) may be permanently discontinued. The FDA recently put out a warning about 5 things that everyone should be aware of regarding Delta-8 THC: Delta-8 THC products have not been evaluated or approved by the FDA for safe use and may be marketed in ways that put the public health at risk. The FDA has received adverse event reports involving delta-8 THC-containing products. Delta-8 THC has psychoactive and intoxicating effects. Delta-8 THC manufacturing often involve use of potentially harmful chemicals to create the concentrations of delta-8 THC claimed in   the marketplace. The final delta-8 THC product may have potentially harmful by-products (contaminants) due to the chemicals used in the process. Manufacturing of delta-8 THC products may occur in uncontrolled or unsanitary settings, which may lead to the presence of unsafe contaminants or other potentially harmful substances. Delta-8 THC products should be kept out of the reach of children and pets.  MORE ABOUT CBD  General Information: CBD was discovered in 1940 and it is a derivative of the cannabis sativa genus plants (Marijuana and Hemp). It is one of the 113 identified substances found in Marijuana. It accounts for up to 40% of the plant's extract. As of 2018, preliminary clinical studies on CBD included research for the treatment of anxiety, movement disorders, and pain. CBD is available and consumed in multiple forms, including inhalation of smoke or vapor, as an aerosol spray, and by mouth. It may be supplied as an oil containing CBD, capsules, dried cannabis, or as a liquid solution. CBD is thought not to be as psychoactive as THC (delta-9-tetrahydrocannabinol - the chemical in marijuana responsible for the "HIGH"). Studies suggest that CBD may interact with different biological target receptors in the body, including cannabinoid and other neurotransmitter receptors. As of 2018 the mechanism of action for its  biological effects has not been determined.  Side-effects  Adverse reactions: Dry mouth, diarrhea, decreased appetite, fatigue, drowsiness, malaise, weakness, sleep disturbances, and others.  Drug interactions: CBC may interact with other medications such as blood-thinners. Because CBD causes drowsiness on its own, it also increases the drowsiness caused by other medications, including antihistamines (such as Benadryl), benzodiazepines (Xanax, Ativan, Valium), antipsychotics, antidepressants and opioids, as well as alcohol and supplements such as kava, melatonin and St. John's Wort. Be cautious with the following combinations:   Brivaracetam (Briviact) Brivaracetam is changed and broken down by the body. CBD might decrease how quickly the body breaks down brivaracetam. This might increase levels of brivaracetam in the body.  Caffeine Caffeine is changed and broken down by the body. CBD might decrease how quickly the body breaks down caffeine. This might increase levels of caffeine in the body.  Carbamazepine (Tegretol) Carbamazepine is changed and broken down by the body. CBD might decrease how quickly the body breaks down carbamazepine. This might increase levels of carbamazepine in the body and increase its side effects.  Citalopram (Celexa) Citalopram is changed and broken down by the body. CBD might decrease how quickly the body breaks down citalopram. This might increase levels of citalopram in the body and increase its side effects.  Clobazam (Onfi) Clobazam is changed and broken down by the liver. CBD might decrease how quickly the liver breaks down clobazam. This might increase the effects and side effects of clobazam.  Eslicarbazepine (Aptiom) Eslicarbazepine is changed and broken down by the body. CBD might decrease how quickly the body breaks down eslicarbazepine. This might increase levels of eslicarbazepine in the body by a small amount.  Everolimus (Zostress) Everolimus is  changed and broken down by the body. CBD might decrease how quickly the body breaks down everolimus. This might increase levels of everolimus in the body.  Lithium Taking higher doses of CBD might increase levels of lithium. This can increase the risk of lithium toxicity.  Medications changed by the liver (Cytochrome P450 1A1 (CYP1A1) substrates) Some medications are changed and broken down by the liver. CBD might change how quickly the liver breaks down these medications. This could change the effects and side effects of these medications.  Medications changed by   the liver (Cytochrome P450 1A2 (CYP1A2) substrates) Some medications are changed and broken down by the liver. CBD might change how quickly the liver breaks down these medications. This could change the effects and side effects of these medications.  Medications changed by the liver (Cytochrome P450 1B1 (CYP1B1) substrates) Some medications are changed and broken down by the liver. CBD might change how quickly the liver breaks down these medications. This could change the effects and side effects of these medications.  Medications changed by the liver (Cytochrome P450 2A6 (CYP2A6) substrates) Some medications are changed and broken down by the liver. CBD might change how quickly the liver breaks down these medications. This could change the effects and side effects of these medications.  Medications changed by the liver (Cytochrome P450 2B6 (CYP2B6) substrates) Some medications are changed and broken down by the liver. CBD might change how quickly the liver breaks down these medications. This could change the effects and side effects of these medications.  Medications changed by the liver (Cytochrome P450 2C19 (CYP2C19) substrates) Some medications are changed and broken down by the liver. CBD might change how quickly the liver breaks down these medications. This could change the effects and side effects of these  medications.  Medications changed by the liver (Cytochrome P450 2C8 (CYP2C8) substrates) Some medications are changed and broken down by the liver. CBD might change how quickly the liver breaks down these medications. This could change the effects and side effects of these medications.  Medications changed by the liver (Cytochrome P450 2C9 (CYP2C9) substrates) Some medications are changed and broken down by the liver. CBD might change how quickly the liver breaks down these medications. This could change the effects and side effects of these medications.  Medications changed by the liver (Cytochrome P450 2D6 (CYP2D6) substrates) Some medications are changed and broken down by the liver. CBD might change how quickly the liver breaks down these medications. This could change the effects and side effects of these medications.  Medications changed by the liver (Cytochrome P450 2E1 (CYP2E1) substrates) Some medications are changed and broken down by the liver. CBD might change how quickly the liver breaks down these medications. This could change the effects and side effects of these medications.  Medications changed by the liver (Cytochrome P450 3A4 (CYP3A4) substrates) Some medications are changed and broken down by the liver. CBD might change how quickly the liver breaks down these medications. This could change the effects and side effects of these medications.  Medications changed by the liver (Glucuronidated drugs) Some medications are changed and broken down by the liver. CBD might change how quickly the liver breaks down these medications. This could change the effects and side effects of these medications.  Medications that decrease the breakdown of other medications by the liver (Cytochrome P450 2C19 (CYP2C19) inhibitors) CBD is changed and broken down by the liver. Some drugs decrease how quickly the liver changes and breaks down CBD. This could change the effects and side effects of  CBD.  Medications that decrease the breakdown of other medications in the liver (Cytochrome P450 3A4 (CYP3A4) inhibitors) CBD is changed and broken down by the liver. Some drugs decrease how quickly the liver changes and breaks down CBD. This could change the effects and side effects of CBD.  Medications that increase breakdown of other medications by the liver (Cytochrome P450 3A4 (CYP3A4) inducers) CBD is changed and broken down by the liver. Some drugs increase how quickly the liver changes and breaks down   CBD. This could change the effects and side effects of CBD.  Medications that increase the breakdown of other medications by the liver (Cytochrome P450 2C19 (CYP2C19) inducers) CBD is changed and broken down by the liver. Some drugs increase how quickly the liver changes and breaks down CBD. This could change the effects and side effects of CBD.  Methadone (Dolophine) Methadone is broken down by the liver. CBD might decrease how quickly the liver breaks down methadone. Taking cannabidiol along with methadone might increase the effects and side effects of methadone.  Rufinamide (Banzel) Rufinamide is changed and broken down by the body. CBD might decrease how quickly the body breaks down rufinamide. This might increase levels of rufinamide in the body by a small amount.  Sedative medications (CNS depressants) CBD might cause sleepiness and slowed breathing. Some medications, called sedatives, can also cause sleepiness and slowed breathing. Taking CBD with sedative medications might cause breathing problems and/or too much sleepiness.  Sirolimus (Rapamune) Sirolimus is changed and broken down by the body. CBD might decrease how quickly the body breaks down sirolimus. This might increase levels of sirolimus in the body.  Stiripentol (Diacomit) Stiripentol is changed and broken down by the body. CBD might decrease how quickly the body breaks down stiripentol. This might increase levels of  stiripentol in the body and increase its side effects.  Tacrolimus (Prograf) Tacrolimus is changed and broken down by the body. CBD might decrease how quickly the body breaks down tacrolimus. This might increase levels of tacrolimus in the body.  Tamoxifen (Soltamox) Tamoxifen is changed and broken down by the body. CBD might affect how quickly the body breaks down tamoxifen. This might affect levels of tamoxifen in the body.  Topiramate (Topamax) Topiramate is changed and broken down by the body. CBD might decrease how quickly the body breaks down topiramate. This might increase levels of topiramate in the body by a small amount.  Valproate Valproic acid can cause liver injury. Taking cannabidiol with valproic acid might increase the chance of liver injury. CBD and/or valproic acid might need to be stopped, or the dose might need to be reduced.  Warfarin (Coumadin) CBD might increase levels of warfarin, which can increase the risk for bleeding. CBD and/or warfarin might need to be stopped, or the dose might need to be reduced.  Zonisamide Zonisamide is changed and broken down by the body. CBD might decrease how quickly the body breaks down zonisamide. This might increase levels of zonisamide in the body by a small amount. (Last update: 01/17/2022) ____________________________________________________________________________________________   ____________________________________________________________________________________________  Naloxone Nasal Spray  Why am I receiving this medication? [place] STOP ACT requires that all patients taking high dose opioids or at risk of opioids respiratory depression, be prescribed an opioid reversal agent, such as Naloxone (AKA: Narcan).  What is this medication? NALOXONE (nal OX one) treats opioid overdose, which causes slow or shallow breathing, severe drowsiness, or trouble staying awake. Call emergency services after using this medication.  You may need additional treatment. Naloxone works by reversing the effects of opioids. It belongs to a group of medications called opioid blockers.  COMMON BRAND NAME(S): Kloxxado, Narcan  What should I tell my care team before I take this medication? They need to know if you have any of these conditions: Heart disease Substance use disorder An unusual or allergic reaction to naloxone, other medications, foods, dyes, or preservatives Pregnant or trying to get pregnant Breast-feeding  When to use this medication? This medication is to   be used for the treatment of respiratory depression (less than 8 breaths per minute) secondary to opioid overdose.   How to use this medication? This medication is for use in the nose. Lay the person on their back. Support their neck with your hand and allow the head to tilt back before giving the medication. The nasal spray should be given into 1 nostril. After giving the medication, move the person onto their side. Do not remove or test the nasal spray until ready to use. Get emergency medical help right away after giving the first dose of this medication, even if the person wakes up. You should be familiar with how to recognize the signs and symptoms of a narcotic overdose. If more doses are needed, give the additional dose in the other nostril. Talk to your care team about the use of this medication in children. While this medication may be prescribed for children as young as newborns for selected conditions, precautions do apply.  Naloxone Overdosage: If you think you have taken too much of this medicine contact a poison control center or emergency room at once.  NOTE: This medicine is only for you. Do not share this medicine with others.  What if I miss a dose? This does not apply.  What may interact with this medication? This is only used during an emergency. No interactions are expected during emergency use. This list may not describe all possible  interactions. Give your health care provider a list of all the medicines, herbs, non-prescription drugs, or dietary supplements you use. Also tell them if you smoke, drink alcohol, or use illegal drugs. Some items may interact with your medicine.  What should I watch for while using this medication? Keep this medication ready for use in the case of an opioid overdose. Make sure that you have the phone number of your care team and local hospital ready. You may need to have additional doses of this medication. Each nasal spray contains a single dose. Some emergencies may require additional doses. After use, bring the treated person to the nearest hospital or call 911. Make sure the treating care team knows that the person has received a dose of this medication. You will receive additional instructions on what to do during and after use of this medication before an emergency occurs.  What side effects may I notice from receiving this medication? Side effects that you should report to your care team as soon as possible: Allergic reactions--skin rash, itching, hives, swelling of the face, lips, tongue, or throat Side effects that usually do not require medical attention (report these to your care team if they continue or are bothersome): Constipation Dryness or irritation inside the nose Headache Increase in blood pressure Muscle spasms Stuffy nose Toothache This list may not describe all possible side effects. Call your doctor for medical advice about side effects. You may report side effects to FDA at 1-800-FDA-1088.  Where should I keep my medication? Because this is an emergency medication, you should keep it with you at all times.  Keep out of the reach of children and pets. Store between 20 and 25 degrees C (68 and 77 degrees F). Do not freeze. Throw away any unused medication after the expiration date. Keep in original box until ready to use.  NOTE: This sheet is a summary. It may not cover  all possible information. If you have questions about this medicine, talk to your doctor, pharmacist, or health care provider.   2023 Elsevier/Gold Standard (  2021-07-14 00:00:00)  ____________________________________________________________________________________________   

## 2022-11-07 ENCOUNTER — Other Ambulatory Visit: Payer: Self-pay | Admitting: Pain Medicine

## 2022-11-09 ENCOUNTER — Telehealth: Payer: Self-pay | Admitting: Student in an Organized Health Care Education/Training Program

## 2022-11-09 ENCOUNTER — Telehealth: Payer: Self-pay | Admitting: Pain Medicine

## 2022-11-09 NOTE — Telephone Encounter (Signed)
LM giving permission to fill early due to pharmacy being closed for the holiday.

## 2022-11-09 NOTE — Telephone Encounter (Signed)
This may be a duplicate note  Scott from [facility] wants to fill her script today as they are closed tomorrow for the holidays. Please call them  [phone] or [phone]

## 2022-11-09 NOTE — Telephone Encounter (Signed)
Scott at [facility] is asking for OK to fill script today, they are closed tomorrow until after christmas. [phone] or [phone]

## 2022-12-28 NOTE — Progress Notes (Unsigned)
PROVIDER NOTE: Information contained herein reflects review and annotations entered in association with encounter. Interpretation of such information and data should be left to medically-trained personnel. Information provided to patient can be located elsewhere in the medical record under "Patient Instructions". Document created using STT-dictation technology, any transcriptional errors that may result from process are unintentional.    Patient: Mariah Poole  Service Category: E/M  Provider: Gaspar Cola, MD  DOB: 1946/08/19  DOS: 01/02/2023  Referring Provider: Marguerita Merles, MD  MRN: [id number]  Specialty: Interventional Pain Management  PCP: Mariah Merles, MD  Type: Established Patient  Setting: Ambulatory outpatient    Location: Office  Delivery: Face-to-face     HPI  [name], a 77 y.o. year old female, is here today because of her No primary diagnosis found.. [name]'s primary complain today is No chief complaint on file. Last encounter: My last encounter with her was on 11/09/2022. 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, L4-5, L5-S1) w/ neurogenic claudication; Lumbar foraminal stenosis (Multilevel) (Bilateral); Cervicalgia; DDD (degenerative disc disease), cervical; Cervical facet hypertrophy; Cervical facet joint syndrome; Lumbar facet hypertrophy; 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 Pain Assessment: Severity of   is reported as a  /10. Location:    / . Onset:  . Quality:  . Timing:  . Modifying factor(s):  Marland Kitchen Vitals:  vitals were not taken for this visit.  BMI: Estimated body mass index is 36.91 kg/m as calculated from the following:   Height as of 10/03/22: 5' (1.524 m).   Weight as of 10/03/22: 189 lb (85.7 kg).  Reason for encounter:  *** . ***  Pharmacotherapy Assessment  Analgesic: Hydrocodone/APAP 10/325, 1 tab PO q 8 hrs PRN (30 mg/day of hydrocodone) (975 mg/day of acetaminophen) MME/day: 30 mg/day.   Monitoring:  PMP: PDMP reviewed during this encounter.       Pharmacotherapy: No side-effects or adverse reactions reported. Compliance: No problems identified. Effectiveness: Clinically acceptable.  No notes on file  No results found for: "CBDTHCR" No results found for: "D8THCCBX" No results found for: "D9THCCBX"  UDS:  Summary  Date Value Ref Range Status  07/04/2022 Note  Final    Comment:    ==================================================================== [facility] (MW) ==================================================================== Test                             Result       Flag       Units  Drug Present and Declared for Prescription Verification   Hydrocodone                    2513         EXPECTED   ng/mg creat   Hydromorphone  290          EXPECTED   ng/mg creat   Dihydrocodeine                 711          EXPECTED   ng/mg creat   Norhydrocodone                 >5000        EXPECTED   ng/mg creat    Sources of hydrocodone include scheduled prescription medications.    Hydromorphone,  dihydrocodeine and norhydrocodone are expected    metabolites of hydrocodone. Hydromorphone and dihydrocodeine are    also available as scheduled prescription medications.  ==================================================================== Test                      Result    Flag   Units      Ref Range   Creatinine              100              mg/dL      >=20 ==================================================================== Declared Medications:  The flagging and interpretation on this report are based on the  following declared medications.  Unexpected results may arise from  inaccuracies in the declared medications.   **Note: The testing scope of this panel includes these medications:   Hydrocodone (Norco)   **Note: The testing scope of this panel does not include the  following reported medications:   Acetaminophen (Norco)  Albuterol (Ventolin HFA)  Atenolol (Tenormin)  Budesonide (Symbicort)  Duloxetine (Cymbalta)  Famotidine (Pepcid)  Formoterol (Symbicort)  Furosemide (Lasix)  Iron  Losartan (Cozaar)  Pantoprazole (Protonix)  Potassium (Klor-Con)  Pravastatin (Pravachol)  Spironolactone (Aldactone)  Turmeric  Vitamin C  Vitamin D3 ==================================================================== For clinical consultation, please call [phone]. ====================================================================       ROS  Constitutional: Denies any fever or chills Gastrointestinal: No reported hemesis, hematochezia, vomiting, or acute GI distress Musculoskeletal: Denies any acute onset joint swelling, redness, loss of ROM, or weakness Neurological: No reported episodes of acute onset apraxia, aphasia, dysarthria, agnosia, amnesia, paralysis, loss of coordination, or loss of consciousness  Medication Review  DULoxetine, HYDROcodone-acetaminophen, Turmeric Curcumin, Vitamin D3, albuterol, ascorbic acid, atenolol, budesonide-formoterol, famotidine,  ferrous sulfate, furosemide, losartan, naloxone, pantoprazole, potassium chloride SA, pravastatin, and spironolactone  History Review  Allergy: Mariah Poole is allergic to contrast media [iodinated contrast media], ioxaglate, and shellfish allergy. Drug: Ms. Skehan  reports current drug use. Alcohol:  reports no history of alcohol use. Tobacco:  reports that she has never smoked. She has never used smokeless tobacco. Social: Ms. Perricone  reports that she has never smoked. She has never used smokeless tobacco. She reports current drug use. She reports that she does not drink alcohol. Medical:  has a past medical history of Anemia, Arthritis, COPD (chronic obstructive pulmonary disease) ([place]), Depression, Diabetes mellitus without complication ([place]), GERD (gastroesophageal reflux disease), Hiatal hernia, History of contrast media allergy. (IVP dye) (09/01/2015), History of hiatal hernia, Hypercholesteremia, Hypertension, Morbid obesity ([place]), Scoliosis, and Shortness of breath dyspnea. Surgical: Ms. Inglett  has a past surgical history that includes Abdominal hysterectomy; Cataract extraction w/ intraocular lens implant (Right); Gastric bypass; Hernia repair; Cataract extraction w/PHACO (Left, 07/19/2015); Cholecystectomy; and Hernia repair. Family: family history includes Diabetes in her mother; Heart disease in her father.  Laboratory Chemistry Profile   Renal Lab Results  Component Value Date   BUN 14 09/14/2021  CREATININE 1.02 (H) 09/14/2021   BCR 21 06/17/2019   GFRAA 53 (L) 06/17/2019   GFRNONAA 57 (L) 09/14/2021    Hepatic Lab Results  Component Value Date   AST 19 09/14/2021   ALT 11 09/14/2021   ALBUMIN 3.8 09/14/2021   ALKPHOS 66 09/14/2021   LIPASE 19 10/27/2015    Electrolytes Lab Results  Component Value Date   NA 140 09/14/2021   K 4.3 09/14/2021   CL 102 09/14/2021   CALCIUM 9.3 09/14/2021   MG 1.9 06/17/2019    Bone Lab Results  Component Value Date    25OHVITD1 45 06/17/2019   25OHVITD2 <1.0 06/17/2019   25OHVITD3 45 06/17/2019    Inflammation (CRP: Acute Phase) (ESR: Chronic Phase) Lab Results  Component Value Date   CRP 7 06/17/2019   ESRSEDRATE 49 (H) 06/17/2019   LATICACIDVEN 4.0 (H) 11/23/2009         Note: Above Lab results reviewed.  Recent Imaging Review  DG HIP UNILAT W OR W/O PELVIS 2-3 VIEWS RIGHT CLINICAL DATA:  Right hip pain, arthralgia. Chronic right hip pain with sciatica. Pain radiates down right leg pain  EXAM: DG HIP (WITH OR WITHOUT PELVIS) 2-3V RIGHT  COMPARISON:  None Available.  FINDINGS: Moderate hip joint space narrowing. Femoral head is well seated. Minimal lateral acetabular spurring. No erosion, avascular necrosis, or bony destruction. No periosteal reaction. No fracture. Pubic rami are intact. Pubic symphysis and sacroiliac joints are congruent.  IMPRESSION: Mild-moderate right hip osteoarthritis.  Electronically Signed   By: Keith Rake M.D.   On: 04/02/2022 18:47 Note: Reviewed        Physical Exam  General appearance: Well nourished, well developed, and well hydrated. In no apparent acute distress Mental status: Alert, oriented x 3 (person, place, & time)       Respiratory: No evidence of acute respiratory distress Eyes: PERLA Vitals: There were no vitals taken for this visit. BMI: Estimated body mass index is 36.91 kg/m as calculated from the following:   Height as of 10/03/22: 5' (1.524 m).   Weight as of 10/03/22: 189 lb (85.7 kg). Ideal: Patient weight not recorded  Assessment   Diagnosis Status  No diagnosis found. Controlled Controlled Controlled   Updated Problems: No problems updated.  Plan of Care  Problem-specific:  No problem-specific Assessment & Plan notes found for this encounter.  Ms. QUINTA BAUTZ has a current medication list which includes the following long-term medication(s): albuterol, atenolol, duloxetine, famotidine, ferosul, furosemide,  hydrocodone-acetaminophen, hydrocodone-acetaminophen, hydrocodone-acetaminophen, pantoprazole, potassium chloride sa, pravastatin, spironolactone, and symbicort.  Pharmacotherapy (Medications Ordered): No orders of the defined types were placed in this encounter.  Orders:  No orders of the defined types were placed in this encounter.  Follow-up plan:   No follow-ups on file.     Interventional Therapies  Risk  Complexity Considerations:   Estimated body mass index is 37.89 kg/m as calculated from the following:   Height as of this encounter: 5' (1.524 m).   Weight as of this encounter: 194 lb (88 kg). No RFA until patient brings BMI down to less than 30 kg/m   Planned  Pending:      Under consideration:   Possible bilateral lumbar facet RFA #1 (on hold until BMI is brought down to 30 kg/m)   Completed:   Diagnostic bilateral lumbar facet MBB x3 (01/18/2022) (5/10 to 0/10) (100/100/75/75-100)    Therapeutic  Palliative (PRN) options:   Therapeutic/palliative bilateral lumbar facet MBB  Recent Visits Date Type Provider Dept  10/03/22 Office Visit Milinda Pointer, MD [facility]  Showing recent visits within past 90 days and meeting all other requirements Future Appointments Date Type Provider Dept  01/02/23 Appointment Milinda Pointer, MD [facility]  Showing future appointments within next 90 days and meeting all other requirements  I discussed the assessment and treatment plan with the patient. The patient was provided an opportunity to ask questions and all were answered. The patient agreed with the plan and demonstrated an understanding of the instructions.  Patient advised to call back or seek an in-person evaluation if the symptoms or condition worsens.  Duration of encounter: *** minutes.  Total time on encounter, as per AMA guidelines included both the face-to-face and non-face-to-face time personally spent by the physician  and/or other qualified health care professional(s) on the day of the encounter (includes time in activities that require the physician or other qualified health care professional and does not include time in activities normally performed by clinical staff). Physician's time may include the following activities when performed: Preparing to see the patient (e.g., pre-charting review of records, searching for previously ordered imaging, lab work, and nerve conduction tests) Review of prior analgesic pharmacotherapies. Reviewing PMP Interpreting ordered tests (e.g., lab work, imaging, nerve conduction tests) Performing post-procedure evaluations, including interpretation of diagnostic procedures Obtaining and/or reviewing separately obtained history Performing a medically appropriate examination and/or evaluation Counseling and educating the patient/family/caregiver Ordering medications, tests, or procedures Referring and communicating with other health care professionals (when not separately reported) Documenting clinical information in the electronic or other health record Independently interpreting results (not separately reported) and communicating results to the patient/ family/caregiver Care coordination (not separately reported)  Note by: Mariah Cola, MD Date: 01/02/2023; Time: 11:48 AM

## 2023-01-02 ENCOUNTER — Ambulatory Visit: Payer: Medicare PPO | Attending: Pain Medicine | Admitting: Pain Medicine

## 2023-01-02 ENCOUNTER — Encounter: Payer: Self-pay | Admitting: Pain Medicine

## 2023-01-02 VITALS — BP 155/83 | HR 105 | Temp 98.1°F | Ht 60.0 in | Wt 189.0 lb

## 2023-01-02 DIAGNOSIS — M0579 Rheumatoid arthritis with rheumatoid factor of multiple sites without organ or systems involvement: Secondary | ICD-10-CM

## 2023-01-02 DIAGNOSIS — Z79899 Other long term (current) drug therapy: Secondary | ICD-10-CM | POA: Diagnosis present

## 2023-01-02 DIAGNOSIS — M542 Cervicalgia: Secondary | ICD-10-CM | POA: Insufficient documentation

## 2023-01-02 DIAGNOSIS — G8929 Other chronic pain: Secondary | ICD-10-CM | POA: Insufficient documentation

## 2023-01-02 DIAGNOSIS — M7918 Myalgia, other site: Secondary | ICD-10-CM | POA: Insufficient documentation

## 2023-01-02 DIAGNOSIS — M47817 Spondylosis without myelopathy or radiculopathy, lumbosacral region: Secondary | ICD-10-CM | POA: Insufficient documentation

## 2023-01-02 DIAGNOSIS — G894 Chronic pain syndrome: Secondary | ICD-10-CM | POA: Insufficient documentation

## 2023-01-02 DIAGNOSIS — Z79891 Long term (current) use of opiate analgesic: Secondary | ICD-10-CM

## 2023-01-02 DIAGNOSIS — M25551 Pain in right hip: Secondary | ICD-10-CM

## 2023-01-02 DIAGNOSIS — M47816 Spondylosis without myelopathy or radiculopathy, lumbar region: Secondary | ICD-10-CM | POA: Diagnosis present

## 2023-01-02 DIAGNOSIS — M47812 Spondylosis without myelopathy or radiculopathy, cervical region: Secondary | ICD-10-CM | POA: Diagnosis present

## 2023-01-02 DIAGNOSIS — M48062 Spinal stenosis, lumbar region with neurogenic claudication: Secondary | ICD-10-CM | POA: Diagnosis present

## 2023-01-02 DIAGNOSIS — M5136 Other intervertebral disc degeneration, lumbar region: Secondary | ICD-10-CM | POA: Insufficient documentation

## 2023-01-02 DIAGNOSIS — M545 Low back pain, unspecified: Secondary | ICD-10-CM | POA: Diagnosis present

## 2023-01-02 MED ORDER — HYDROCODONE-ACETAMINOPHEN 10-325 MG PO TABS: 1.0000 | ORAL_TABLET | Freq: Three times a day (TID) | ORAL | 0 refills | Status: DC

## 2023-01-02 NOTE — Patient Instructions (Addendum)
______________________________________________________________________  Procedure instructions  Do not eat or drink fluids (other than water) for 6 hours before your procedure  No water for 2 hours before your procedure  Take your blood pressure medicine with a sip of water  Arrive 30 minutes before your appointment  Carefully read the "Preparing for your procedure" detailed instructions  If you have questions call us at [phone]  _____________________________________________________________________    ______________________________________________________________________  Preparing for your procedure  During your procedure appointment there will be: No Prescription Refills. No disability issues to discussed. No medication changes or discussions.  Instructions: Food intake: Avoid eating anything solid for at least 8 hours prior to your procedure. Clear liquid intake: You may take clear liquids such as water up to 2 hours prior to your procedure. (No carbonated drinks. No soda.) Transportation: Unless otherwise stated by your physician, bring a driver. Morning Medicines: Except for blood thinners, take all of your other morning medications with a sip of water. Make sure to take your heart and blood pressure medicines. If your blood pressure's lower number is above 100, the case will be rescheduled. Blood thinners: Make sure to stop your blood thinners as instructed.  If you take a blood thinner, but were not instructed to stop it, call our office [phone] and ask to talk to a nurse. Not stopping a blood thinner prior to certain procedures could lead to serious complications. Diabetics on insulin: Notify the staff so that you can be scheduled 1st case in the morning. If your diabetes requires high dose insulin, take only  of your normal insulin dose the morning of the procedure and notify the staff that you have done so. Preventing infections: Shower with an  antibacterial soap the morning of your procedure.  Build-up your immune system: Take 1000 mg of Vitamin C with every meal (3 times a day) the day prior to your procedure. Antibiotics: Inform the nursing staff if you are taking any antibiotics or if you have any conditions that may require antibiotics prior to procedures. (Example: recent joint implants)   Pregnancy: If you are pregnant make sure to notify the nursing staff. Not doing so may result in injury to the fetus, including death.  Sickness: If you have a cold, fever, or any active infections, call and cancel or reschedule your procedure. Receiving steroids while having an infection may result in complications. Arrival: You must be in the facility at least 30 minutes prior to your scheduled procedure. Tardiness: Your scheduled time is also the cutoff time. If you do not arrive at least 15 minutes prior to your procedure, you will be rescheduled.  Children: Do not bring any children with you. Make arrangements to keep them home. Dress appropriately: There is always a possibility that your clothing may get soiled. Avoid long dresses. Valuables: Do not bring any jewelry or valuables.  Reasons to call and reschedule or cancel your procedure: (Following these recommendations will minimize the risk of a serious complication.) Surgeries: Avoid having procedures within 2 weeks of any surgery. (Avoid for 2 weeks before or after any surgery). Flu Shots: Avoid having procedures within 2 weeks of a flu shots or . (Avoid for 2 weeks before or after immunizations). Barium: Avoid having a procedure within 7-10 days after having had a radiological study involving the use of radiological contrast. (Myelograms, Barium swallow or enema study). Heart attacks: Avoid any elective procedures or surgeries for the initial 6 months after a "Myocardial Infarction" (Heart Attack). Blood thinners: It  is imperative that you stop these medications before procedures. Let us  know if you if you take any blood thinner.  Infection: Avoid procedures during or within two weeks of an infection (including chest colds or gastrointestinal problems). Symptoms associated with infections include: Localized redness, fever, chills, night sweats or profuse sweating, burning sensation when voiding, cough, congestion, stuffiness, runny nose, sore throat, diarrhea, nausea, vomiting, cold or Flu symptoms, recent or current infections. It is specially important if the infection is over the area that we intend to treat. Heart and lung problems: Symptoms that may suggest an active cardiopulmonary problem include: cough, chest pain, breathing difficulties or shortness of breath, dizziness, ankle swelling, uncontrolled high or unusually low blood pressure, and/or palpitations. If you are experiencing any of these symptoms, cancel your procedure and contact your primary care physician for an evaluation.  Remember:  Regular Business hours are:  Monday to Thursday 8:00 AM to 4:00 PM  Provider's Schedule: Milinda Pointer, MD:  Procedure days: Tuesday and Thursday 7:30 AM to 4:00 PM  Gillis Santa, MD:  Procedure days: Monday and Wednesday 7:30 AM to 4:00 PM  ______________________________________________________________________    ____________________________________________________________________________________________  General Risks and Possible Complications  Patient Responsibilities: It is important that you read this as it is part of your informed consent. It is our duty to inform you of the risks and possible complications associated with treatments offered to you. It is your responsibility as a patient to read this and to ask questions about anything that is not clear or that you believe was not covered in this document.  Patient's Rights: You have the right to refuse treatment. You also have the right to change your mind, even after initially having agreed to have the treatment  done. However, under this last option, if you wait until the last second to change your mind, you may be charged for the materials used up to that point.  Introduction: Medicine is not an Chief Strategy Officer. Everything in Medicine, including the lack of treatment(s), carries the potential for danger, harm, or loss (which is by definition: Risk). In Medicine, a complication is a secondary problem, condition, or disease that can aggravate an already existing one. All treatments carry the risk of possible complications. The fact that a side effects or complications occurs, does not imply that the treatment was conducted incorrectly. It must be clearly understood that these can happen even when everything is done following the highest safety standards.  No treatment: You can choose not to proceed with the proposed treatment alternative. The "PRO(s)" would include: avoiding the risk of complications associated with the therapy. The "CON(s)" would include: not getting any of the treatment benefits. These benefits fall under one of three categories: diagnostic; therapeutic; and/or palliative. Diagnostic benefits include: getting information which can ultimately lead to improvement of the disease or symptom(s). Therapeutic benefits are those associated with the successful treatment of the disease. Finally, palliative benefits are those related to the decrease of the primary symptoms, without necessarily curing the condition (example: decreasing the pain from a flare-up of a chronic condition, such as incurable terminal cancer).  General Risks and Complications: These are associated to most interventional treatments. They can occur alone, or in combination. They fall under one of the following six (6) categories: no benefit or worsening of symptoms; bleeding; infection; nerve damage; allergic reactions; and/or death. No benefits or worsening of symptoms: In Medicine there are no guarantees, only probabilities. No  healthcare provider can ever guarantee that a  medical treatment will work, they can only state the probability that it may. Furthermore, there is always the possibility that the condition may worsen, either directly, or indirectly, as a consequence of the treatment. Bleeding: This is more common if the patient is taking a blood thinner, either prescription or over the counter (example: Goody Powders, Fish oil, Aspirin, Garlic, etc.), or if suffering a condition associated with impaired coagulation (example: Hemophilia, cirrhosis of the liver, low platelet counts, etc.). However, even if you do not have one on these, it can still happen. If you have any of these conditions, or take one of these drugs, make sure to notify your treating physician. Infection: This is more common in patients with a compromised immune system, either due to disease (example: diabetes, cancer, human immunodeficiency virus [HIV], etc.), or due to medications or treatments (example: therapies used to treat cancer and rheumatological diseases). However, even if you do not have one on these, it can still happen. If you have any of these conditions, or take one of these drugs, make sure to notify your treating physician. Nerve Damage: This is more common when the treatment is an invasive one, but it can also happen with the use of medications, such as those used in the treatment of cancer. The damage can occur to small secondary nerves, or to large primary ones, such as those in the spinal cord and brain. This damage may be temporary or permanent and it may lead to impairments that can range from temporary numbness to permanent paralysis and/or brain death. Allergic Reactions: Any time a substance or material comes in contact with our body, there is the possibility of an allergic reaction. These can range from a mild skin rash (contact dermatitis) to a severe systemic reaction (anaphylactic reaction), which can result in death. Death: In  general, any medical intervention can result in death, most of the time due to an unforeseen complication. ____________________________________________________________________________________________    ____________________________________________________________________________________________  Patient Information update  To: All of our patients.  Re: Name change.  It has been made official that our current name, "[facility]"   will soon be changed to "[facility]".   The purpose of this change is to eliminate any confusion created by the concept of our practice being a "Medication Management Pain [facility]". In the past this has led to the misconception that we treat pain primarily by the use of prescription medications.  Nothing can be farther from the truth.   Understanding PAIN MANAGEMENT: To further understand what our practice does, you first have to understand that "Pain Management" is a subspecialty that requires additional training once a physician has completed their specialty training, which can be in either Anesthesia, Neurology, Psychiatry, or Physical Medicine and Rehabilitation (PMR). Each one of these contributes to the final approach taken by each physician to the management of their patient's pain. To be a "Pain Management Specialist" you must have first completed one of the specialty trainings below.  Anesthesiologists - trained in clinical pharmacology and interventional techniques such as nerve blockade and regional as well as central neuroanatomy. They are trained to block pain before, during, and after surgical interventions.  Neurologists - trained in the diagnosis and pharmacological treatment of complex neurological conditions, such as Multiple Sclerosis, Parkinson's, spinal cord injuries, and other systemic conditions that may be associated with symptoms that may  include but are not limited to pain. They tend to rely  primarily on the treatment of chronic pain using prescription medications.  Psychiatrist - trained in conditions affecting the psychosocial wellbeing of patients including but not limited to depression, anxiety, schizophrenia, personality disorders, addiction, and other substance use disorders that may be associated with chronic pain. They tend to rely primarily on the treatment of chronic pain using prescription medications.   Physical Medicine and Rehabilitation (PMR) physicians, also known as physiatrists - trained to treat a wide variety of medical conditions affecting the brain, spinal cord, nerves, bones, joints, ligaments, muscles, and tendons. Their training is primarily aimed at treating patients that have suffered injuries that have caused severe physical impairment. Their training is primarily aimed at the physical therapy and rehabilitation of those patients. They may also work alongside orthopedic surgeons or neurosurgeons using their expertise in assisting surgical patients to recover after their surgeries.  INTERVENTIONAL PAIN MANAGEMENT is sub-subspecialty of Pain Management.  Our physicians are Board-certified in Anesthesia, Pain Management, and Interventional Pain Management.  This meaning that not only have they been trained and Board-certified in their specialty of Anesthesia, and subspecialty of Pain Management, but they have also received further training in the sub-subspecialty of Interventional Pain Management, in order to become Board-certified as INTERVENTIONAL PAIN MANAGEMENT SPECIALIST.    Mission: Our goal is to use our skills in  [place] as alternatives to the chronic use of prescription opioid medications for the treatment of pain. To make this more clear, we have changed our name to reflect what we do and offer. We will continue to offer medication management assessment and recommendations, but we  will not be taking over any patient's medication management.  ____________________________________________________________________________________________     ____________________________________________________________________________________________  Opioid Pain Medication Update  To: All patients taking opioid pain medications. (I.e.: hydrocodone, hydromorphone, oxycodone, oxymorphone, morphine, codeine, methadone, tapentadol, tramadol, buprenorphine, fentanyl, etc.)  Re: Updated review of side effects and adverse reactions of opioid analgesics, as well as new information about long term effects of this class of medications.  Direct risks of long-term opioid therapy are not limited to opioid addiction and overdose. Potential medical risks include serious fractures, breathing problems during sleep, hyperalgesia, immunosuppression, chronic constipation, bowel obstruction, myocardial infarction, and tooth decay secondary to xerostomia.  Unpredictable adverse effects that can occur even if you take your medication correctly: Cognitive impairment, respiratory depression, and death. Most people think that if they take their medication "correctly", and "as instructed", that they will be safe. Nothing could be farther from the truth. In reality, a significant amount of recorded deaths associated with the use of opioids has occurred in individuals that had taken the medication for a long time, and were taking their medication correctly. The following are examples of how this can happen: Patient taking his/her medication for a long time, as instructed, without any side effects, is given a certain antibiotic or another unrelated medication, which in turn triggers a "Drug-to-drug interaction" leading to disorientation, cognitive impairment, impaired reflexes, respiratory depression or an untoward event leading to serious bodily harm or injury, including death.  Patient taking his/her medication for a long  time, as instructed, without any side effects, develops an acute impairment of liver and/or kidney function. This will lead to a rapid inability of the body to breakdown and eliminate their pain medication, which will result in effects similar to an "overdose", but with the same medicine and dose that they had always taken. This again may lead to disorientation, cognitive impairment, impaired reflexes, respiratory depression or an untoward  event leading to serious bodily harm or injury, including death.  A similar problem will occur with patients as they grow older and their liver and kidney function begins to decrease as part of the aging process.  Background information: Historically, the original case for using long-term opioid therapy to treat chronic noncancer pain was based on safety assumptions that subsequent experience has called into question. In 1996, the American Pain Society and the [place] Academy of [employer] issued a consensus statement supporting long-term opioid therapy. This statement acknowledged the dangers of opioid prescribing but concluded that the risk for addiction was low; respiratory depression induced by opioids was short-lived, occurred mainly in opioid-naive patients, and was antagonized by pain; tolerance was not a common problem; and efforts to control diversion should not constrain opioid prescribing. This has now proven to be wrong. Experience regarding the risks for opioid addiction, misuse, and overdose in community practice has failed to support these assumptions.  According to the Centers for Disease Control and Prevention, fatal overdoses involving opioid analgesics have increased sharply over the past decade. Currently, more than 96,700 people die from drug overdoses every year. Opioids are a factor in 7 out of every 10 overdose deaths. Deaths from drug overdose have surpassed motor vehicle accidents as the leading cause of death for individuals between the ages of  73 and 62.  Clinical data suggest that neuroendocrine dysfunction may be very common in both men and women, potentially causing hypogonadism, erectile dysfunction, infertility, decreased libido, osteoporosis, and depression. Recent studies linked higher opioid dose to increased opioid-related mortality. Controlled observational studies reported that long-term opioid therapy may be associated with increased risk for cardiovascular events. Subsequent meta-analysis concluded that the safety of long-term opioid therapy in elderly patients has not been proven.   Side Effects and adverse reactions: Common side effects: Drowsiness (sedation). Dizziness. Nausea and vomiting. Constipation. Physical dependence -- Dependence often manifests with withdrawal symptoms when opioids are discontinued or decreased. Tolerance -- As you take repeated doses of opioids, you require increased medication to experience the same effect of pain relief. Respiratory depression -- This can occur in healthy people, especially with higher doses. However, people with COPD, asthma or other lung conditions may be even more susceptible to fatal respiratory impairment.  Uncommon side effects: An increased sensitivity to feeling pain and extreme response to pain (hyperalgesia). Chronic use of opioids can lead to this. Delayed gastric emptying (the process by which the contents of your stomach are moved into your small intestine). Muscle rigidity. Immune system and hormonal dysfunction. Quick, involuntary muscle jerks (myoclonus). Arrhythmia. Itchy skin (pruritus). Dry mouth (xerostomia).  Long-term side effects: Chronic constipation. Sleep-disordered breathing (SDB). Increased risk of bone fractures. Hypothalamic-pituitary-adrenal dysregulation. Increased risk of overdose.  RISKS: Fractures and Falls:  Opioids increase the risk and incidence of falls. This is of particular importance in elderly patients.  Endocrine  System:  Long-term administration is associated with endocrine abnormalities. Influences on both the hypothalamic-pituitary-adrenal axis?and the hypothalamic-pituitary-gonadal axis have been demonstrated with consequent hypogonadism and adrenal insufficiency in both sexes. Hypogonadism and decreased levels of dehydroepiandrosterone sulfate have been reported in men and women. Endocrine effects can lead to: Amenorrhoea in women Reduced libido in both sexes Erectile dysfunction in men Infertility Depression and fatigue Patients (particularly women of childbearing age) should avoid opioids. There is insufficient evidence to recommend routine monitoring of asymptomatic patients taking opioids in the long-term for hormonal deficiencies.  Immune System: Human studies have demonstrated that opioids have an immunomodulating effect.  These effects are mediated via opioid receptors both on immune effector cells and in the central nervous system. Opioids have been demonstrated to have adverse effects on antimicrobial response and anti-tumour surveillance. Buprenorphine has been demonstrated to have no impact on immune function.  Opioid Induced Hyperalgesia: Human studies have demonstrated that prolonged use of opioids can lead to a state of abnormal pain sensitivity, sometimes called opioid induced hyperalgesia (OIH). Opioid induced hyperalgesia is not usually seen in the absence of tolerance to opioid analgesia. Clinically, hyperalgesia may be diagnosed if the patient on long-term opioid therapy presents with increased pain. This might be qualitatively and anatomically distinct from pain related to disease progression or to breakthrough pain resulting from development of opioid tolerance. Pain associated with hyperalgesia tends to be more diffuse than the pre-existing pain and less defined in quality. Management of opioid induced hyperalgesia requires opioid dose reduction.  Cancer: Chronic opioid  therapy has been associated with an increased risk of cancer among noncancer patients with chronic pain. This association was more evident in chronic strong opioid users. Chronic opioid consumption causes significant pathological changes in the small intestine and colon. Epidemiological studies have found that there is a link between opium dependence and initiation of gastrointestinal cancers. Cancer is the second leading cause of death after cardiovascular disease. Chronic use of opioids can cause multiple conditions such as GERD, immunosuppression and renal damage as well as carcinogenic effects, which are associated with the incidence of cancers.   Mortality: Long-term opioid use has been associated with increased mortality among patients with chronic non-cancer pain (CNCP).  Prescription of long-acting opioids for chronic noncancer pain was associated with a significantly increased risk of all-cause mortality, including deaths from causes other than overdose.  Reference: Von Korff M, Kolodny A, Deyo RA, Chou R. Long-term opioid therapy reconsidered. Ann Intern Med. 2011 Sep 6;155(5):325-8. doi: 10.7326/[id number]. PMID: [id number]; PMCIDXX:[id number]. Morley Kos, Hayward RA, Dunn KM, [place] KP. Risk of adverse events in patients prescribed long-term opioids: A cohort study in the [place] Clinical [facility] [employer]. Eur J Pain. 2019 May;23(5):908-922. doi: 10.1002/ejp.1357. Epub 2019 Jan 31. PMID: [id number]. Colameco S, Coren JS, Ciervo CA. Continuous opioid treatment for chronic noncancer pain: a time for moderation in prescribing. Postgrad Med. 2009 Jul;121(4):61-6. doi: 10.3810/pgm.2009.07.2032. PMID: [id number]. Heywood Bene RN, [place] SD, Blazina I, Rosalio Loud, Bougatsos C, Deyo RA. The effectiveness and risks of long-term opioid therapy for chronic pain: a systematic review for a [employer] [facility] [employer] to [employer]. Ann Intern Med. 2015 Feb 17;162(4):276-86. doi: [id number]. PMID: [id number]. Marjory Sneddon [facility], [place] DM. NCHS Data Brief No. 22. [place]: Centers for Disease Control and Prevention; 2009. Sep, Increase in Fatal Poisonings Involving Opioid Analgesics in the [place], 1999-2006. Song IA, Choi HR, Oh TK. Long-term opioid use and mortality in patients with chronic non-cancer pain: Ten-year follow-up study in [place] from 2010 through 2019. EClinicalMedicine. 2022 Jul 18;51:101558. doi: 10.1016/j.[id number]. PMID: [id number]; PMCIDOX:8550940. Huser, W., Schubert, T., Vogelmann, T. et al. All-cause mortality in patients with long-term opioid therapy compared with non-opioid analgesics for chronic non-cancer pain: a database study. [place] Med 18, 162 (2020). [email or web address] Rashidian H, Roxy Cedar, Malekzadeh R, Haghdoost AA. An Ecological Study of the Association between Opiate Use and Incidence of Cancers. Addict Health. 2016 Fall;8(4):252-260. PMID: [id number]; PMCIDQI:[id number].  Our Goal: Our goal is to control your pain with means other than the use of opioid pain  medications.  Our Recommendation: Talk to your physician about coming off of these medications. We can assist you with the tapering down and stopping these medicines. Based on the new information, even if you cannot completely stop the medication, a decrease in the dose may be associated with a lesser risk. Ask for other means of controlling the pain. Decrease or eliminate those factors that significantly contribute to your pain such as smoking, obesity, and a diet heavily tilted towards "inflammatory" nutrients.  ____________________________________________________________________________________________     ____________________________________________________________________________________________  Carron Brazen Pain Medication Shortage  The [place] is experiencing  worsening drug shortages. These have had a negative widespread effect on patient care and treatment. Not expected to improve any time soon. Predicted to last past 2029.   Drug shortage list (generic names) Oxycodone IR Oxycodone/APAP Oxymorphone IR Hydromorphone Hydrocodone/APAP Morphine  Where is the problem?  Manufacturing and supply level.  Will this shortage affect you?  Only if you take any of the above pain medications.  How? You may be unable to fill your prescription.  Your pharmacist may offer a "partial fill" of your prescription. (Warning: Do not accept partial fills.) Prescriptions partially filled cannot be transferred to another pharmacy. Read our Medication Rules and Regulation. Depending on how much medicine you are dependent on, you may experience withdrawals when unable to get the medication.  Recommendations: Consider ending your dependence on opioid pain medications. Ask your pain specialist to assist you with the process. Consider switching to a medication currently not in shortage, such as Buprenorphine. Talk to your pain specialist about this option. Consider decreasing your pain medication requirements by managing tolerance thru "Drug Holidays". This may help minimize withdrawals, should you run out of medicine. Control your pain thru the use of non-pharmacological interventional therapies.   Your prescriber: Prescribers cannot be blamed for shortages. Medication manufacturing and supply issues cannot be fixed by the prescriber.   NOTE: The prescriber is not responsible for supplying the medication, or solving supply issues. Work with your pharmacist to solve it. The patient is responsible for the decision to take or continue taking the medication and for identifying and securing a legal supply source. By law, supplying the medication is the job and responsibility of the pharmacy. The prescriber is responsible for the evaluation, monitoring, and prescribing of  these medications.   Prescribers will NOT: Re-issue prescriptions that have been partially filled. Re-issue prescriptions already sent to a pharmacy.  Re-send prescriptions to a different pharmacy because yours did not have your medication. Ask pharmacist to order more medicine or transfer the prescription to another pharmacy. (Read below.)  New 2023 regulation: "July 20, 2022 Revised Regulation Allows DEA-Registered Pharmacies to Transfer Electronic Prescriptions at a Patient's Request [place] Patients now have the ability to request their electronic prescription be transferred to another pharmacy without having to go back to their practitioner to initiate the request. This revised regulation went into effect on Monday, July 16, 2022.     At a patient's request, a DEA-registered retail pharmacy can now transfer an electronic prescription for a controlled substance (schedules II-V) to another DEA-registered retail pharmacy. Prior to this change, patients would have to go through their practitioner to cancel their prescription and have it re-issued to a different pharmacy. The process was taxing and time consuming for both patients and practitioners.    The Drug Enforcement Administration [facility]) published its intent to revise the process for transferring electronic prescriptions on October 07, 2020.  The  final rule was published in the federal register on June 14, 2022 and went into effect 30 days later.  Under the final rule, a prescription can only be transferred once between pharmacies, and only if allowed under existing state or other applicable law. The prescription must remain in its electronic form; may not be altered in any way; and the transfer must be communicated directly between two licensed pharmacists. It's important to note, any authorized refills transfer with the original prescription, which means the entire prescription will be  filled at the same pharmacy".  Reference: [email or web address] [facility] website announcement)  [email or web address] ([place])   [employer] / Vol. 88, No. 143 / Thursday, June 14, 2022 / Rules and Regulations DEPARTMENT OF JUSTICE  Drug Enforcement Administration  21 CFR Part 1306  [Docket No. [id number]]  RIN [id number] Transfer of Electronic Prescriptions for Schedules II-V Controlled Substances Between Pharmacies for Initial Filling  ____________________________________________________________________________________________     _______________________________________________________________________  Medication Rules  Purpose: To inform patients, and their family members, of our medication rules and regulations.  Applies to: All patients receiving prescriptions from our practice (written or electronic).  Pharmacy of record: This is the pharmacy where your electronic prescriptions will be sent. Make sure we have the correct one.  Electronic prescriptions: In compliance with the [place] (STOP) Act of 2017 (Session Lanny Cramp [phone]), effective November 19, 2018, all controlled substances must be electronically prescribed. Written prescriptions, faxing, or calling prescriptions to a pharmacy will no longer be done.  Prescription refills: These will be provided only during in-person appointments. No medications will be renewed without a "face-to-face" evaluation with your provider. Applies to all prescriptions.  NOTE: The following applies primarily to controlled substances (Opioid* Pain Medications).   Type of encounter (visit): For patients receiving controlled substances, face-to-face visits are required. (Not an option and not up to the patient.)  Patient's  responsibilities: Pain Pills: Bring all pain pills to every appointment (except for procedure appointments). Pill Bottles: Bring pills in original pharmacy bottle. Bring bottle, even if empty. Always bring the bottle of the most recent fill.  Medication refills: You are responsible for knowing and keeping track of what medications you are taking and when is it that you will need a refill. The day before your appointment: write a list of all prescriptions that need to be refilled. The day of the appointment: give the list to the admitting nurse. Prescriptions will be written only during appointments. No prescriptions will be written on procedure days. If you forget a medication: it will not be "Called in", "Faxed", or "electronically sent". You will need to get another appointment to get these prescribed. No early refills. Do not call asking to have your prescription filled early. Partial  or short prescriptions: Occasionally your pharmacy may not have enough pills to fill your prescription.  NEVER ACCEPT a partial fill or a prescription that is short of the total amount of pills that you were prescribed.  With controlled substances the law allows 72 hours for the pharmacy to complete the prescription.  If the prescription is not completed within 72 hours, the pharmacist will require a new prescription to be written. This means that you will be short on your medicine and we WILL NOT send another prescription to complete your original prescription.  Instead, request the pharmacy to send a carrier to a nearby branch to get enough medication to provide you with your full prescription. Prescription Accuracy: You  are responsible for carefully inspecting your prescriptions before leaving our office. Have the discharge nurse carefully go over each prescription with you, before taking them home. Make sure that your name is accurately spelled, that your address is correct. Check the name and dose of your medication  to make sure it is accurate. Check the number of pills, and the written instructions to make sure they are clear and accurate. Make sure that you are given enough medication to last until your next medication refill appointment. Taking Medication: Take medication as prescribed. When it comes to controlled substances, taking less pills or less frequently than prescribed is permitted and encouraged. Never take more pills than instructed. Never take the medication more frequently than prescribed.  Inform other Doctors: Always inform, all of your healthcare providers, of all the medications you take. Pain Medication from other Providers: You are not allowed to accept any additional pain medication from any other Doctor or Healthcare provider. There are two exceptions to this rule. (see below) In the event that you require additional pain medication, you are responsible for notifying us, as stated below. Cough Medicine: Often these contain an opioid, such as codeine or hydrocodone. Never accept or take cough medicine containing these opioids if you are already taking an opioid* medication. The combination may cause respiratory failure and death. Medication Agreement: You are responsible for carefully reading and following our Medication Agreement. This must be signed before receiving any prescriptions from our practice. Safely store a copy of your signed Agreement. Violations to the Agreement will result in no further prescriptions. (Additional copies of our Medication Agreement are available upon request.) Laws, Rules, & Regulations: All patients are expected to follow all Federal and [employer], Rules, [employer]. Ignorance of the Laws does not constitute a valid excuse.  Illegal drugs and Controlled Substances: The use of illegal substances (including, but not limited to marijuana and its derivatives) and/or the illegal use of any controlled substances is strictly prohibited. Violation of this  rule may result in the immediate and permanent discontinuation of any and all prescriptions being written by our practice. The use of any illegal substances is prohibited. Adopted CDC guidelines & recommendations: Target dosing levels will be at or below 60 MME/day. Use of benzodiazepines** is not recommended.  Exceptions: There are only two exceptions to the rule of not receiving pain medications from other Healthcare Providers. Exception #1 (Emergencies): In the event of an emergency (i.e.: accident requiring emergency care), you are allowed to receive additional pain medication. However, you are responsible for: As soon as you are able, call our office [phone], at any time of the day or night, and leave a message stating your name, the date and nature of the emergency, and the name and dose of the medication prescribed. In the event that your call is answered by a member of our staff, make sure to document and save the date, time, and the name of the person that took your information.  Exception #2 (Planned Surgery): In the event that you are scheduled by another doctor or dentist to have any type of surgery or procedure, you are allowed (for a period no longer than 30 days), to receive additional pain medication, for the acute post-op pain. However, in this case, you are responsible for picking up a copy of our "Post-op Pain Management for Surgeons" handout, and giving it to your surgeon or dentist. This document is available at our office, and does not require an appointment  to obtain it. Simply go to our office during business hours (Monday-Thursday from 8:00 AM to 4:00 PM) (Friday 8:00 AM to 12:00 Noon) or if you have a scheduled appointment with [place], prior to your surgery, and ask for it by name. In addition, you are responsible for: calling our office [phone], at any time of the day or night, and leaving a message stating your name, name of your surgeon, type of surgery, and date of  procedure or surgery. Failure to comply with your responsibilities may result in termination of therapy involving the controlled substances. Medication Agreement Violation. Following the above rules, including your responsibilities will help you in avoiding a Medication Agreement Violation ("Breaking your Pain Medication Contract").  Consequences:  Not following the above rules may result in permanent discontinuation of medication prescription therapy.  *Opioid medications include: morphine, codeine, oxycodone, oxymorphone, hydrocodone, hydromorphone, meperidine, tramadol, tapentadol, buprenorphine, fentanyl, methadone. **Benzodiazepine medications include: diazepam (Valium), alprazolam (Xanax), clonazepam (Klonopine), lorazepam (Ativan), clorazepate (Tranxene), chlordiazepoxide (Librium), estazolam (Prosom), oxazepam (Serax), temazepam (Restoril), triazolam (Halcion) (Last updated: 09/11/2022) ______________________________________________________________________    ______________________________________________________________________  Medication Recommendations and Reminders  Applies to: All patients receiving prescriptions (written and/or electronic).  Medication Rules & Regulations: You are responsible for reading, knowing, and following our "Medication Rules" document. These exist for your safety and that of others. They are not flexible and neither are we. Dismissing or ignoring them is an act of "non-compliance" that may result in complete and irreversible termination of such medication therapy. For safety reasons, "non-compliance" will not be tolerated. As with the [place] fundamental legal principle of "ignorance of the law is no defense", we will accept no excuses for not having read and knowing the content of documents provided to you by our practice.  Pharmacy of record:  Definition: This is the pharmacy where your electronic prescriptions will be sent.  We do not endorse any  particular pharmacy. It is up to you and your insurance to decide what pharmacy to use.  We do not restrict you in your choice of pharmacy. However, once we write for your prescriptions, we will NOT be re-sending more prescriptions to fix restricted supply problems created by your pharmacy, or your insurance.  The pharmacy listed in the electronic medical record should be the one where you want electronic prescriptions to be sent. If you choose to change pharmacy, simply notify our nursing staff. Changes will be made only during your regular appointments and not over the phone.  Recommendations: Keep all of your pain medications in a safe place, under lock and key, even if you live alone. We will NOT replace lost, stolen, or damaged medication. We do not accept "Police Reports" as proof of medications having been stolen. After you fill your prescription, take 1 week's worth of pills and put them away in a safe place. You should keep a separate, properly labeled bottle for this purpose. The remainder should be kept in the original bottle. Use this as your primary supply, until it runs out. Once it's gone, then you know that you have 1 week's worth of medicine, and it is time to come in for a prescription refill. If you do this correctly, it is unlikely that you will ever run out of medicine. To make sure that the above recommendation works, it is very important that you make sure your medication refill appointments are scheduled at least 1 week before you run out of medicine. To do this in an effective manner, make sure that you  do not leave the office without scheduling your next medication management appointment. Always ask the nursing staff to show you in your prescription , when your medication will be running out. Then arrange for the receptionist to get you a return appointment, at least 7 days before you run out of medicine. Do not wait until you have 1 or 2 pills left, to come in. This is very poor  planning and does not take into consideration that we may need to cancel appointments due to bad weather, sickness, or emergencies affecting our staff. DO NOT ACCEPT A "Partial Fill": If for any reason your pharmacy does not have enough pills/tablets to completely fill or refill your prescription, do not allow for a "partial fill". The law allows the pharmacy to complete that prescription within 72 hours, without requiring a new prescription. If they do not fill the rest of your prescription within those 72 hours, you will need a separate prescription to fill the remaining amount, which we will NOT provide. If the reason for the partial fill is your insurance, you will need to talk to the pharmacist about payment alternatives for the remaining tablets, but again, DO NOT ACCEPT A PARTIAL FILL, unless you can trust your pharmacist to obtain the remainder of the pills within 72 hours.  Prescription refills and/or changes in medication(s):  Prescription refills, and/or changes in dose or medication, will be conducted only during scheduled medication management appointments. (Applies to both, written and electronic prescriptions.) No refills on procedure days. No medication will be changed or started on procedure days. No changes, adjustments, and/or refills will be conducted on a procedure day. Doing so will interfere with the diagnostic portion of the procedure. No phone refills. No medications will be "called into the pharmacy". No Fax refills. No weekend refills. No Holliday refills. No after hours refills.  Remember:  Business hours are:  Monday to Thursday 8:00 AM to 4:00 PM Provider's Schedule: Milinda Pointer, MD - Appointments are:  Medication management: Monday and Wednesday 8:00 AM to 4:00 PM Procedure day: Tuesday and Thursday 7:30 AM to 4:00 PM Gillis Santa, MD - Appointments are:  Medication management: Tuesday and Thursday 8:00 AM to 4:00 PM Procedure day: Monday and Wednesday 7:30  AM to 4:00 PM (Last update: 09/11/2022) ______________________________________________________________________    ____________________________________________________________________________________________  Drug Holidays  What is a "Drug Holiday"? Drug Holiday: is the name given to the process of slowly tapering down and temporarily stopping the pain medication for the purpose of decreasing or eliminating tolerance to the drug.  Benefits Improved effectiveness Decreased required effective dose Improved pain control End dependence on high dose therapy Decrease cost of therapy Uncovering "opioid-induced hyperalgesia". (OIH)  What is "opioid hyperalgesia"? It is a paradoxical increase in pain caused by exposure to opioids. Stopping the opioid pain medication, contrary to the expected, it actually decreases or completely eliminates the pain. Ref.: "A comprehensive review of opioid-induced hyperalgesia". Brion Aliment, et.al. Pain Physician. 2011 Mar-Apr;14(2):145-61.  What is tolerance? Tolerance: the progressive loss of effectiveness of a pain medicine due to repetitive use. A common problem of opioid pain medications.  How long should a "Drug Holiday" last? Effectiveness depends on the patient staying off all opioid pain medicines for a minimum of 14 consecutive days. (2 weeks)  How about just taking less of the medicine? Does not work. Will not accomplish goal of eliminating the excess receptors.  How about switching to a different pain medicine? (AKA. "Opioid rotation") Does not work. Creates the illusion  of effectiveness by taking advantage of inaccurate equivalent dose calculations between different opioids. -This "technique" was promoted by studies funded by [employer], such as [employer], creators of "OxyContin".  Can I stop the medicine "cold [place]"? Depends. You should always coordinate with your Pain Specialist to make the transition as smoothly as  possible. Avoid stopping the medicine abruptly without consulting. We recommend a "slow taper".  What is a slow taper? Taper: refers to the gradual decrease in dose.   How do I stop/taper the dose? Slowly. Decrease the daily amount of pills that you take by one (1) pill every seven (7) days. This is called a "slow downward taper". Example: if you normally take four (4) pills per day, drop it to three (3) pills per day for seven (7) days, then to two (2) pills per day for seven (7) days, then to one (1) per day for seven (7) days, and then stop the medicine. The 14 day "Drug Holiday" starts on the first day without medicine.   Will I experience withdrawals? Unlikely with a slow taper.  What triggers withdrawals? Withdrawals are triggered by the sudden/abrupt stop of high dose opioids. Withdrawals can be avoided by slowly decreasing the dose over a prolonged period of time.  What are withdrawals? Symptoms associated with sudden/abrupt reduction/stopping of high-dose, long-term use of pain medication. Withdrawal are seldom seen on low dose therapy, or patients rarely taking opioid medication.  Early Withdrawal Symptoms may include: Agitation Anxiety Muscle aches Increased tearing Insomnia Runny nose Sweating Yawning  Late symptoms may include: Abdominal cramping Diarrhea Dilated pupils Goose bumps Nausea Vomiting  (Last update: 10/28/2022) ____________________________________________________________________________________________   ____________________________________________________________________________________________  Naloxone Nasal Spray  Why am I receiving this medication? [place] STOP ACT requires that all patients taking high dose opioids or at risk of opioids respiratory depression, be prescribed an opioid reversal agent, such as Naloxone (AKA: Narcan).  What is this medication? NALOXONE (nal OX one) treats opioid overdose, which causes slow or shallow  breathing, severe drowsiness, or trouble staying awake. Call emergency services after using this medication. You may need additional treatment. Naloxone works by reversing the effects of opioids. It belongs to a group of medications called opioid blockers.  COMMON BRAND NAME(S): Kloxxado, Narcan  What should I tell my care team before I take this medication? They need to know if you have any of these conditions: Heart disease Substance use disorder An unusual or allergic reaction to naloxone, other medications, foods, dyes, or preservatives Pregnant or trying to get pregnant Breast-feeding  When to use this medication? This medication is to be used for the treatment of respiratory depression (less than 8 breaths per minute) secondary to opioid overdose.   How to use this medication? This medication is for use in the nose. Lay the person on their back. Support their neck with your hand and allow the head to tilt back before giving the medication. The nasal spray should be given into 1 nostril. After giving the medication, move the person onto their side. Do not remove or test the nasal spray until ready to use. Get emergency medical help right away after giving the first dose of this medication, even if the person wakes up. You should be familiar with how to recognize the signs and symptoms of a narcotic overdose. If more doses are needed, give the additional dose in the other nostril. Talk to your care team about the use of this medication in children. While this medication may be  prescribed for children as young as newborns for selected conditions, precautions do apply.  Naloxone Overdosage: If you think you have taken too much of this medicine contact a poison control center or emergency room at once.  NOTE: This medicine is only for you. Do not share this medicine with others.  What if I miss a dose? This does not apply.  What may interact with this medication? This is only used during  an emergency. No interactions are expected during emergency use. This list may not describe all possible interactions. Give your health care provider a list of all the medicines, herbs, non-prescription drugs, or dietary supplements you use. Also tell them if you smoke, drink alcohol, or use illegal drugs. Some items may interact with your medicine.  What should I watch for while using this medication? Keep this medication ready for use in the case of an opioid overdose. Make sure that you have the phone number of your care team and local hospital ready. You may need to have additional doses of this medication. Each nasal spray contains a single dose. Some emergencies may require additional doses. After use, bring the treated person to the nearest hospital or call 911. Make sure the treating care team knows that the person has received a dose of this medication. You will receive additional instructions on what to do during and after use of this medication before an emergency occurs.  What side effects may I notice from receiving this medication? Side effects that you should report to your care team as soon as possible: Allergic reactions--skin rash, itching, hives, swelling of the face, lips, tongue, or throat Side effects that usually do not require medical attention (report these to your care team if they continue or are bothersome): Constipation Dryness or irritation inside the nose Headache Increase in blood pressure Muscle spasms Stuffy nose Toothache This list may not describe all possible side effects. Call your doctor for medical advice about side effects. You may report side effects to FDA at 1-800-FDA-1088.  Where should I keep my medication? Because this is an emergency medication, you should keep it with you at all times.  Keep out of the reach of children and pets. Store between 20 and 25 degrees C (68 and 77 degrees F). Do not freeze. Throw away any unused medication after the  expiration date. Keep in original box until ready to use.  NOTE: This sheet is a summary. It may not cover all possible information. If you have questions about this medicine, talk to your doctor, pharmacist, or health care provider.   2023 Elsevier/Gold Standard (2021-07-14 00:00:00)  ____________________________________________________________________________________________

## 2023-01-02 NOTE — Progress Notes (Signed)
Nursing Pain Medication Assessment:  Safety precautions to be maintained throughout the outpatient stay will include: orient to surroundings, keep bed in low position, maintain call bell within reach at all times, provide assistance with transfer out of bed and ambulation.  Medication Inspection Compliance: Pill count conducted under aseptic conditions, in front of the patient. Neither the pills nor the bottle was removed from the patient's sight at any time. Once count was completed pills were immediately returned to the patient in their original bottle.  Medication: Hydrocodone/APAP Pill/Patch Count:  33 of 90 pills remain Pill/Patch Appearance: Markings consistent with prescribed medication Bottle Appearance: Standard pharmacy container. Clearly labeled. Filled Date: 1 / 22 / 2024 Last Medication intake:  TodaySafety precautions to be maintained throughout the outpatient stay will include: orient to surroundings, keep bed in low position, maintain call bell within reach at all times, provide assistance with transfer out of bed and ambulation.

## 2023-01-17 ENCOUNTER — Telehealth: Payer: Self-pay

## 2023-01-17 NOTE — Telephone Encounter (Signed)
Insurance denied facets. I am going to call them and appeal

## 2023-03-26 NOTE — Progress Notes (Unsigned)
PROVIDER NOTE: Information contained herein reflects review and annotations entered in association with encounter. Interpretation of such information and data should be left to medically-trained personnel. Information provided to patient can be located elsewhere in the medical record under "Patient Instructions". Document created using STT-dictation technology, any transcriptional errors that may result from process are unintentional.    Patient: Mariah Poole  Service Category: E/M  Provider: Oswaldo Done, MD  DOB: Dec 04, 1945  DOS: 03/27/2023  Referring Provider: Leanna Sato, MD  MRN: [id number]  Specialty: Interventional Pain Management  PCP: Leanna Sato, MD  Type: Established Patient  Setting: Ambulatory outpatient    Location: Office  Delivery: Face-to-face     HPI  Mariah Poole, a 77 y.o. year old female, is here today because of her No primary diagnosis found.. Mariah Poole's primary complain today is No chief complaint on file.  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, L4-5, L5-S1) w/ neurogenic claudication; Lumbar foraminal stenosis (Multilevel) (Bilateral); Cervicalgia; DDD (degenerative disc disease), cervical; Cervical facet hypertrophy; Cervical facet joint syndrome; Lumbar facet hypertrophy; 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 Pain Assessment: Severity of   is reported as a  /10. Location:    / . Onset:  . Quality:  . Timing:  . Modifying factor(s):  Marland Kitchen Vitals:  vitals were not taken for this visit.  BMI: Estimated body mass index is 36.91 kg/m as calculated from the following:   Height as of 01/02/23: 5' (1.524 m).   Weight as of 01/02/23: 189 lb (85.7 kg). Last encounter: 01/02/2023. Last procedure: Visit date not found.  Reason for encounter: medication management. ***  RTCB: 07/08/2023   Pharmacotherapy Assessment  Analgesic: Hydrocodone/APAP 10/325, 1 tab PO q 8 hrs PRN (30 mg/day of hydrocodone) (975 mg/day of acetaminophen) MME/day: 30 mg/day.   Monitoring: [place] PMP: PDMP reviewed during this encounter.       Pharmacotherapy: No side-effects or adverse reactions reported. Compliance: No problems identified. Effectiveness: Clinically acceptable.  No notes on file  No results found for: "CBDTHCR" No results found for: "D8THCCBX" No results found for: "D9THCCBX"  UDS:  Summary  Date Value Ref Range Status  07/04/2022 Note  Final    Comment:    ==================================================================== [employer] 13 (MW) ==================================================================== Test                             Result       Flag       Units  Drug Present and Declared for Prescription Verification   Hydrocodone                    2513         EXPECTED   ng/mg creat  Hydromorphone                  290          EXPECTED   ng/mg creat   Dihydrocodeine                 711          EXPECTED   ng/mg creat   Norhydrocodone                 >5000        EXPECTED   ng/mg creat    Sources of hydrocodone include scheduled  prescription medications.    Hydromorphone, dihydrocodeine and norhydrocodone are expected    metabolites of hydrocodone. Hydromorphone and dihydrocodeine are    also available as scheduled prescription medications.  ==================================================================== Test                      Result    Flag   Units      Ref Range   Creatinine              100              mg/dL      >=65 ==================================================================== Declared Medications:  The flagging and interpretation on this report are based on the  following declared medications.  Unexpected results may arise from  inaccuracies in the declared medications.   **Note: The testing scope of this panel includes these medications:   Hydrocodone (Norco)   **Note: The testing scope of this panel does not include the  following reported medications:   Acetaminophen (Norco)  Albuterol (Ventolin HFA)  Atenolol (Tenormin)  Budesonide (Symbicort)  Duloxetine (Cymbalta)  Famotidine (Pepcid)  Formoterol (Symbicort)  Furosemide (Lasix)  Iron  Losartan (Cozaar)  Pantoprazole (Protonix)  Potassium (Klor-Con)  Pravastatin (Pravachol)  Spironolactone (Aldactone)  Turmeric  Vitamin C  Vitamin D3 ==================================================================== For clinical consultation, please call [phone]. ====================================================================       ROS  Constitutional: Denies any fever or chills Gastrointestinal: No reported hemesis, hematochezia, vomiting, or acute GI distress Musculoskeletal: Denies any acute onset joint swelling, redness, loss of ROM, or weakness Neurological: No reported episodes of acute onset apraxia, aphasia, dysarthria, agnosia, amnesia, paralysis, loss of coordination, or loss of consciousness  Medication Review  DULoxetine, HYDROcodone-acetaminophen, Turmeric Curcumin, Vitamin D3, albuterol, ascorbic acid,  atenolol, budesonide-formoterol, famotidine, ferrous sulfate, furosemide, losartan, naloxone, pantoprazole, potassium chloride SA, pravastatin, and spironolactone  History Review  Allergy: Mariah Poole is allergic to contrast media [iodinated contrast media], ioxaglate, and shellfish allergy. Drug: Mariah Poole  reports current drug use. Alcohol:  reports no history of alcohol use. Tobacco:  reports that she has never smoked. She has never used smokeless tobacco. Social: Mariah Poole  reports that she has never smoked. She has never used smokeless tobacco. She reports current drug use. She reports that she does not drink alcohol. Medical:  has a past medical history of Anemia, Arthritis, COPD (chronic obstructive pulmonary disease) (HCC), Depression, Diabetes mellitus without complication (HCC), GERD (gastroesophageal reflux disease), Hiatal hernia, History of contrast media allergy. (IVP dye) (09/01/2015), History of hiatal hernia, Hypercholesteremia, Hypertension, Morbid obesity (HCC), Scoliosis, and Shortness of breath dyspnea. Surgical: Mariah Poole  has a past surgical history that includes Abdominal hysterectomy; Cataract extraction w/ intraocular lens implant (Right); Gastric bypass; Hernia repair; Cataract extraction w/PHACO (Left, 07/19/2015); Cholecystectomy; and Hernia repair. Family: family history includes Diabetes in her mother; Heart disease in her father.  Laboratory Chemistry Profile   Renal Lab Results  Component Value Date   BUN 14 09/14/2021   CREATININE 1.02 (H) 09/14/2021   BCR 21 06/17/2019   GFRAA 53 (L) 06/17/2019   GFRNONAA 57 (L) 09/14/2021    Hepatic Lab Results  Component Value Date   AST 19 09/14/2021   ALT 11 09/14/2021   ALBUMIN 3.8 09/14/2021   ALKPHOS 66 09/14/2021   LIPASE 19 10/27/2015    Electrolytes Lab Results  Component Value Date   NA 140 09/14/2021   K 4.3 09/14/2021   CL 102 09/14/2021   CALCIUM 9.3 09/14/2021   MG 1.9 06/17/2019     Bone Lab Results  Component Value Date   25OHVITD1 45 06/17/2019   25OHVITD2 <1.0 06/17/2019   25OHVITD3 45 06/17/2019    Inflammation (CRP: Acute Phase) (ESR: Chronic Phase) Lab Results  Component Value Date   CRP 7 06/17/2019   ESRSEDRATE 49 (H) 06/17/2019   LATICACIDVEN 4.0 (H) 11/23/2009         Note: Above Lab results reviewed.  Recent Imaging Review  DG HIP UNILAT W OR W/O PELVIS 2-3 VIEWS RIGHT CLINICAL DATA:  Right hip pain, arthralgia. Chronic right hip pain with sciatica. Pain radiates down right leg pain  EXAM: DG HIP (WITH OR WITHOUT PELVIS) 2-3V RIGHT  COMPARISON:  None Available.  FINDINGS: Moderate hip joint space narrowing. Femoral head is well seated. Minimal lateral acetabular spurring. No erosion, avascular necrosis, or bony destruction. No periosteal reaction. No fracture. Pubic rami are intact. Pubic symphysis and sacroiliac joints are congruent.  IMPRESSION: Mild-moderate right hip osteoarthritis.  Electronically Signed   By: Narda Rutherford M.D.   On: 04/02/2022 18:47 Note: Reviewed        Physical Exam  General appearance: Well nourished, well developed, and well hydrated. In no apparent acute distress Mental status: Alert, oriented x 3 (person, place, & time)       Respiratory: No evidence of acute respiratory distress Eyes: PERLA Vitals: There were no vitals taken for this visit. BMI: Estimated body mass index is 36.91 kg/m as calculated from the following:   Height as of 01/02/23: 5' (1.524 m).   Weight as of 01/02/23: 189 lb (85.7 kg). Ideal: Patient weight not recorded  Assessment   Diagnosis Status  1. Cervicalgia   2. Chronic pain syndrome   3. Pharmacologic therapy   4. DDD (degenerative disc disease), lumbar   5. 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 Cervical facet joint syndrome   7. Chronic musculoskeletal pain   8. Chronic hip pain (Right)   9. Encounter for medication  management   10. Encounter for chronic pain management   11. Chronic use of opiate for therapeutic purpose   12. Rheumatoid arthritis involving multiple sites with positive rheumatoid factor (HCC)   13. Lumbar facet syndrome (Bilateral) (R>L)   14. Chronic low back pain (1ry area of Pain) (Bilateral) (R>L) w/o sciatica    Controlled Controlled Controlled   Updated Problems: No problems updated.  Plan of Care  Problem-specific:  No problem-specific Assessment & Plan notes found for this encounter.  Mariah Poole has a current medication list which includes the following long-term medication(s): albuterol, atenolol, duloxetine, famotidine, ferosul, furosemide, hydrocodone-acetaminophen, hydrocodone-acetaminophen, pantoprazole, potassium chloride sa, pravastatin, spironolactone, and symbicort.  Pharmacotherapy (Medications Ordered): No orders of the defined types were placed in this encounter.  Orders:  No orders of the defined types were placed in  this encounter.  Follow-up plan:   No follow-ups on file.      Interventional Therapies  Risk  Complexity Considerations:   Allergy: CONTRAST  COPD  BA  CKD  SOBOE  GERD  Jehovah's witness  Hx. intestinal obstruction  CHF  HTN  DM  MO (BMI>30)  No RFA until patient brings BMI down to less than 30 kg/m   Planned  Pending:   Therapeutic bilateral lumbar facet MBB #4    Under consideration:   Possible bilateral lumbar facet RFA #1 (on hold until BMI is brought down to 30 kg/m)   Completed:   Diagnostic bilateral lumbar facet MBB x3 (01/18/2022) (5/10 to 0/10) (100/100/75/75-100)    Therapeutic  Palliative (PRN) options:   Therapeutic/palliative bilateral lumbar facet MBB   Pharmacotherapy  Nonopioids transferred 11/09/2020: Magnesium, Lyrica, and Mobic.        Recent Visits Date Type Provider Dept  01/02/23 Office Visit Delano Metz, MD [facility]  Showing recent visits within past 90  days and meeting all other requirements Future Appointments Date Type Provider Dept  03/27/23 Appointment Delano Metz, MD [facility]  Showing future appointments within next 90 days and meeting all other requirements  I discussed the assessment and treatment plan with the patient. The patient was provided an opportunity to ask questions and all were answered. The patient agreed with the plan and demonstrated an understanding of the instructions.  Patient advised to call back or seek an in-person evaluation if the symptoms or condition worsens.  Duration of encounter: *** minutes.  Total time on encounter, as per AMA guidelines included both the face-to-face and non-face-to-face time personally spent by the physician and/or other qualified health care professional(s) on the day of the encounter (includes time in activities that require the physician or other qualified health care professional and does not include time in activities normally performed by clinical staff). Physician's time may include the following activities when performed: Preparing to see the patient (e.g., pre-charting review of records, searching for previously ordered imaging, lab work, and nerve conduction tests) Review of prior analgesic pharmacotherapies. Reviewing PMP Interpreting ordered tests (e.g., lab work, imaging, nerve conduction tests) Performing post-procedure evaluations, including interpretation of diagnostic procedures Obtaining and/or reviewing separately obtained history Performing a medically appropriate examination and/or evaluation Counseling and educating the patient/family/caregiver Ordering medications, tests, or procedures Referring and communicating with other health care professionals (when not separately reported) Documenting clinical information in the electronic or other health record Independently interpreting results (not separately reported) and communicating results to the  patient/ family/caregiver Care coordination (not separately reported)  Note by: Oswaldo Done, MD Date: 03/27/2023; Time: 12:11 PM

## 2023-03-26 NOTE — Patient Instructions (Incomplete)
____________________________________________________________________________________________  Opioid Pain Medication Update  To: All patients taking opioid pain medications. (I.e.: hydrocodone, hydromorphone, oxycodone, oxymorphone, morphine, codeine, methadone, tapentadol, tramadol, buprenorphine, fentanyl, etc.)  Re: Updated review of side effects and adverse reactions of opioid analgesics, as well as new information about long term effects of this class of medications.  Direct risks of long-term opioid therapy are not limited to opioid addiction and overdose. Potential medical risks include serious fractures, breathing problems during sleep, hyperalgesia, immunosuppression, chronic constipation, bowel obstruction, myocardial infarction, and tooth decay secondary to xerostomia.  Unpredictable adverse effects that can occur even if you take your medication correctly: Cognitive impairment, respiratory depression, and death. Most people think that if they take their medication "correctly", and "as instructed", that they will be safe. Nothing could be farther from the truth. In reality, a significant amount of recorded deaths associated with the use of opioids has occurred in individuals that had taken the medication for a long time, and were taking their medication correctly. The following are examples of how this can happen: Patient taking his/her medication for a long time, as instructed, without any side effects, is given a certain antibiotic or another unrelated medication, which in turn triggers a "Drug-to-drug interaction" leading to disorientation, cognitive impairment, impaired reflexes, respiratory depression or an untoward event leading to serious bodily harm or injury, including death.  Patient taking his/her medication for a long time, as instructed, without any side effects, develops an acute impairment of liver and/or kidney function. This will lead to a rapid inability of the body to  breakdown and eliminate their pain medication, which will result in effects similar to an "overdose", but with the same medicine and dose that they had always taken. This again may lead to disorientation, cognitive impairment, impaired reflexes, respiratory depression or an untoward event leading to serious bodily harm or injury, including death.  A similar problem will occur with patients as they grow older and their liver and kidney function begins to decrease as part of the aging process.  Background information: Historically, the original case for using long-term opioid therapy to treat chronic noncancer pain was based on safety assumptions that subsequent experience has called into question. In 1996, the American Pain Society and the American Academy of Pain Medicine issued a consensus statement supporting long-term opioid therapy. This statement acknowledged the dangers of opioid prescribing but concluded that the risk for addiction was low; respiratory depression induced by opioids was short-lived, occurred mainly in opioid-naive patients, and was antagonized by pain; tolerance was not a common problem; and efforts to control diversion should not constrain opioid prescribing. This has now proven to be wrong. Experience regarding the risks for opioid addiction, misuse, and overdose in community practice has failed to support these assumptions.  According to the Centers for Disease Control and Prevention, fatal overdoses involving opioid analgesics have increased sharply over the past decade. Currently, more than 96,700 people die from drug overdoses every year. Opioids are a factor in 7 out of every 10 overdose deaths. Deaths from drug overdose have surpassed motor vehicle accidents as the leading cause of death for individuals between the ages of 35 and 54.  Clinical data suggest that neuroendocrine dysfunction may be very common in both men and women, potentially causing hypogonadism, erectile  dysfunction, infertility, decreased libido, osteoporosis, and depression. Recent studies linked higher opioid dose to increased opioid-related mortality. Controlled observational studies reported that long-term opioid therapy may be associated with increased risk for cardiovascular events. Subsequent meta-analysis concluded   that the safety of long-term opioid therapy in elderly patients has not been proven.   Side Effects and adverse reactions: Common side effects: Drowsiness (sedation). Dizziness. Nausea and vomiting. Constipation. Physical dependence -- Dependence often manifests with withdrawal symptoms when opioids are discontinued or decreased. Tolerance -- As you take repeated doses of opioids, you require increased medication to experience the same effect of pain relief. Respiratory depression -- This can occur in healthy people, especially with higher doses. However, people with COPD, asthma or other lung conditions may be even more susceptible to fatal respiratory impairment.  Uncommon side effects: An increased sensitivity to feeling pain and extreme response to pain (hyperalgesia). Chronic use of opioids can lead to this. Delayed gastric emptying (the process by which the contents of your stomach are moved into your small intestine). Muscle rigidity. Immune system and hormonal dysfunction. Quick, involuntary muscle jerks (myoclonus). Arrhythmia. Itchy skin (pruritus). Dry mouth (xerostomia).  Long-term side effects: Chronic constipation. Sleep-disordered breathing (SDB). Increased risk of bone fractures. Hypothalamic-pituitary-adrenal dysregulation. Increased risk of overdose.  RISKS: Fractures and Falls:  Opioids increase the risk and incidence of falls. This is of particular importance in elderly patients.  Endocrine System:  Long-term administration is associated with endocrine abnormalities (endocrinopathies). (Also known as Opioid-induced Endocrinopathy) Influences  on both the hypothalamic-pituitary-adrenal axis?and the hypothalamic-pituitary-gonadal axis have been demonstrated with consequent hypogonadism and adrenal insufficiency in both sexes. Hypogonadism and decreased levels of dehydroepiandrosterone sulfate have been reported in men and women. Endocrine effects include: Amenorrhoea in women (abnormal absence of menstruation) Reduced libido in both sexes Decreased sexual function Erectile dysfunction in men Hypogonadisms (decreased testicular function with shrinkage of testicles) Infertility Depression and fatigue Loss of muscle mass Anxiety Depression Immune suppression Hyperalgesia Weight gain Anemia Osteoporosis Patients (particularly women of childbearing age) should avoid opioids. There is insufficient evidence to recommend routine monitoring of asymptomatic patients taking opioids in the long-term for hormonal deficiencies.  Immune System: Human studies have demonstrated that opioids have an immunomodulating effect. These effects are mediated via opioid receptors both on immune effector cells and in the central nervous system. Opioids have been demonstrated to have adverse effects on antimicrobial response and anti-tumour surveillance. Buprenorphine has been demonstrated to have no impact on immune function.  Opioid Induced Hyperalgesia: Human studies have demonstrated that prolonged use of opioids can lead to a state of abnormal pain sensitivity, sometimes called opioid induced hyperalgesia (OIH). Opioid induced hyperalgesia is not usually seen in the absence of tolerance to opioid analgesia. Clinically, hyperalgesia may be diagnosed if the patient on long-term opioid therapy presents with increased pain. This might be qualitatively and anatomically distinct from pain related to disease progression or to breakthrough pain resulting from development of opioid tolerance. Pain associated with hyperalgesia tends to be more diffuse than the  pre-existing pain and less defined in quality. Management of opioid induced hyperalgesia requires opioid dose reduction.  Cancer: Chronic opioid therapy has been associated with an increased risk of cancer among noncancer patients with chronic pain. This association was more evident in chronic strong opioid users. Chronic opioid consumption causes significant pathological changes in the small intestine and colon. Epidemiological studies have found that there is a link between opium dependence and initiation of gastrointestinal cancers. Cancer is the second leading cause of death after cardiovascular disease. Chronic use of opioids can cause multiple conditions such as GERD, immunosuppression and renal damage as well as carcinogenic effects, which are associated with the incidence of cancers.   Mortality: Long-term opioid use   has been associated with increased mortality among patients with chronic non-cancer pain (CNCP).  Prescription of long-acting opioids for chronic noncancer pain was associated with a significantly increased risk of all-cause mortality, including deaths from causes other than overdose.  Reference: Von Korff M, Kolodny A, Deyo RA, Chou R. Long-term opioid therapy reconsidered. Ann Intern Med. 2011 Sep 6;155(5):325-8. doi: 10.7326/[id number]. PMID: 21893626; PMCID: PMC3280085. Bedson J, Chen Y, Ashworth J, Hayward RA, Dunn KM, Jordan KP. Risk of adverse events in patients prescribed long-term opioids: A cohort study in the [place] Clinical Practice Research Datalink. Eur J Pain. 2019 May;23(5):908-922. doi: 10.1002/ejp.1357. Epub 2019 Jan 31. PMID: 30620116. Colameco S, Coren JS, Ciervo CA. Continuous opioid treatment for chronic noncancer pain: a time for moderation in prescribing. Postgrad Med. 2009 Jul;121(4):61-6. doi: 10.3810/pgm.2009.07.2032. PMID: 19641271. Chou R, Turner JA, Devine EB, Hansen RN, Sullivan SD, Blazina I, Dana T, Bougatsos C, Deyo RA. The  effectiveness and risks of long-term opioid therapy for chronic pain: a systematic review for a National Institutes of Health Pathways to Prevention Workshop. Ann Intern Med. 2015 Feb 17;162(4):276-86. doi: 10.7326/M14-2559. PMID: 25581257. Warner M, Chen LH, Makuc DM. NCHS Data Brief No. 22. [place]: Centers for Disease Control and Prevention; 2009. Sep, Increase in Fatal Poisonings Involving Opioid Analgesics in the [place], 1999-2006. Song IA, Choi HR, Oh TK. Long-term opioid use and mortality in patients with chronic non-cancer pain: Ten-year follow-up study in [place] from 2010 through 2019. EClinicalMedicine. 2022 Jul 18;51:101558. doi: 10.1016/j.eclinm.2022.101558. PMID: 35875817; PMCID: PMC9304910. Huser, W., Schubert, T., Vogelmann, T. et al. All-cause mortality in patients with long-term opioid therapy compared with non-opioid analgesics for chronic non-cancer pain: a database study. BMC Med 18, 162 (2020). https://doi.org/10.1186/[email or web address] Rashidian H, Zendehdel K, Kamangar F, Malekzadeh R, Haghdoost AA. An Ecological Study of the Association between Opiate Use and Incidence of Cancers. Addict Health. 2016 Fall;8(4):252-260. PMID: 28819556; PMCID: PMC5554805.  Our Goal: Our goal is to control your pain with means other than the use of opioid pain medications.  Our Recommendation: Talk to your physician about coming off of these medications. We can assist you with the tapering down and stopping these medicines. Based on the new information, even if you cannot completely stop the medication, a decrease in the dose may be associated with a lesser risk. Ask for other means of controlling the pain. Decrease or eliminate those factors that significantly contribute to your pain such as smoking, obesity, and a diet heavily tilted towards "inflammatory" nutrients.  Last Updated: 01/16/2023    ____________________________________________________________________________________________     ____________________________________________________________________________________________  Patient Information update  To: All of our patients.  Re: Name change.  It has been made official that our current name, "[facility]"   will soon be changed to "[facility]".   The purpose of this change is to eliminate any confusion created by the concept of our practice being a "Medication Management Pain Clinic". In the past this has led to the misconception that we treat pain primarily by the use of prescription medications.  Nothing can be farther from the truth.   Understanding PAIN MANAGEMENT: To further understand what our practice does, you first have to understand that "Pain Management" is a subspecialty that requires additional training once a physician has completed their specialty training, which can be in either Anesthesia, Neurology, Psychiatry, or Physical Medicine and Rehabilitation (PMR). Each one of these contributes to the final approach taken by each physician to   the management of their patient's pain. To be a "Pain Management Specialist" you must have first completed one of the specialty trainings below.  Anesthesiologists - trained in clinical pharmacology and interventional techniques such as nerve blockade and regional as well as central neuroanatomy. They are trained to block pain before, during, and after surgical interventions.  Neurologists - trained in the diagnosis and pharmacological treatment of complex neurological conditions, such as Multiple Sclerosis, Parkinson's, spinal cord injuries, and other systemic conditions that may be associated with symptoms that may include but are not limited to pain. They tend to rely primarily on the treatment of chronic pain  using prescription medications.  Psychiatrist - trained in conditions affecting the psychosocial wellbeing of patients including but not limited to depression, anxiety, schizophrenia, personality disorders, addiction, and other substance use disorders that may be associated with chronic pain. They tend to rely primarily on the treatment of chronic pain using prescription medications.   Physical Medicine and Rehabilitation (PMR) physicians, also known as physiatrists - trained to treat a wide variety of medical conditions affecting the brain, spinal cord, nerves, bones, joints, ligaments, muscles, and tendons. Their training is primarily aimed at treating patients that have suffered injuries that have caused severe physical impairment. Their training is primarily aimed at the physical therapy and rehabilitation of those patients. They may also work alongside orthopedic surgeons or neurosurgeons using their expertise in assisting surgical patients to recover after their surgeries.  INTERVENTIONAL PAIN MANAGEMENT is sub-subspecialty of Pain Management.  Our physicians are Board-certified in Anesthesia, Pain Management, and Interventional Pain Management.  This meaning that not only have they been trained and Board-certified in their specialty of Anesthesia, and subspecialty of Pain Management, but they have also received further training in the sub-subspecialty of Interventional Pain Management, in order to become Board-certified as INTERVENTIONAL PAIN MANAGEMENT SPECIALIST.    Mission: Our goal is to use our skills in  [place] as alternatives to the chronic use of prescription opioid medications for the treatment of pain. To make this more clear, we have changed our name to reflect what we do and offer. We will continue to offer medication management assessment and recommendations, but we will not be taking over any patient's medication  management.  ____________________________________________________________________________________________     ____________________________________________________________________________________________  National Pain Medication Shortage  The [place] is experiencing worsening drug shortages. These have had a negative widespread effect on patient care and treatment. Not expected to improve any time soon. Predicted to last past 2029.   Drug shortage list (generic names) Oxycodone IR Oxycodone/APAP Oxymorphone IR Hydromorphone Hydrocodone/APAP Morphine  Where is the problem?  Manufacturing and supply level.  Will this shortage affect you?  Only if you take any of the above pain medications.  How? You may be unable to fill your prescription.  Your pharmacist may offer a "partial fill" of your prescription. (Warning: Do not accept partial fills.) Prescriptions partially filled cannot be transferred to another pharmacy. Read our Medication Rules and Regulation. Depending on how much medicine you are dependent on, you may experience withdrawals when unable to get the medication.  Recommendations: Consider ending your dependence on opioid pain medications. Ask your pain specialist to assist you with the process. Consider switching to a medication currently not in shortage, such as Buprenorphine. Talk to your pain specialist about this option. Consider decreasing your pain medication requirements by managing tolerance thru "Drug Holidays". This may help minimize withdrawals, should you run out of medicine. Control your pain thru  the use of non-pharmacological interventional therapies.   Your prescriber: Prescribers cannot be blamed for shortages. Medication manufacturing and supply issues cannot be fixed by the prescriber.   NOTE: The prescriber is not responsible for supplying the medication, or solving supply issues. Work with your pharmacist to solve it. The patient is responsible for  the decision to take or continue taking the medication and for identifying and securing a legal supply source. By law, supplying the medication is the job and responsibility of the pharmacy. The prescriber is responsible for the evaluation, monitoring, and prescribing of these medications.   Prescribers will NOT: Re-issue prescriptions that have been partially filled. Re-issue prescriptions already sent to a pharmacy.  Re-send prescriptions to a different pharmacy because yours did not have your medication. Ask pharmacist to order more medicine or transfer the prescription to another pharmacy. (Read below.)  New 2023 regulation: "July 20, 2022 Revised Regulation Allows DEA-Registered Pharmacies to Transfer Electronic Prescriptions at a Patient's Request [place] Patients now have the ability to request their electronic prescription be transferred to another pharmacy without having to go back to their practitioner to initiate the request. This revised regulation went into effect on Monday, July 16, 2022.     At a patient's request, a DEA-registered retail pharmacy can now transfer an electronic prescription for a controlled substance (schedules II-V) to another DEA-registered retail pharmacy. Prior to this change, patients would have to go through their practitioner to cancel their prescription and have it re-issued to a different pharmacy. The process was taxing and time consuming for both patients and practitioners.    The Drug Enforcement Administration [facility]) published its intent to revise the process for transferring electronic prescriptions on October 07, 2020.  The final rule was published in the federal register on June 14, 2022 and went into effect 30 days later.  Under the final rule, a prescription can only be transferred once between pharmacies, and only if allowed under existing state or other applicable law. The prescription must  remain in its electronic form; may not be altered in any way; and the transfer must be communicated directly between two licensed pharmacists. It's important to note, any authorized refills transfer with the original prescription, which means the entire prescription will be filled at the same pharmacy".  Reference: [employer] [facility] website announcement)  [email or web address].pdf ([place])   [employer] / Vol. 88, No. 143 / Thursday, June 14, 2022 / Rules and Regulations DEPARTMENT OF JUSTICE  Drug Enforcement Administration  21 CFR Part 1306  [Docket No. [id number]]  RIN [id number] Transfer of Electronic Prescriptions for Schedules II-V Controlled Substances Between Pharmacies for Initial Filling  ____________________________________________________________________________________________     ____________________________________________________________________________________________  Transfer of Pain Medication between Pharmacies  Re: 2023 DEA Clarification on existing regulation  Published on DEA Website: July 20, 2022  Title: Revised Regulation Allows DEA-Registered Pharmacies to Conservator, museum/gallery Prescriptions at a Patient's Request [place]  "Patients now have the ability to request their electronic prescription be transferred to another pharmacy without having to go back to their practitioner to initiate the request. This revised regulation went into effect on Monday, July 16, 2022.     At a patient's request, a DEA-registered retail pharmacy can now transfer an electronic prescription for a controlled substance (schedules II-V) to another DEA-registered retail pharmacy. Prior to this change, patients would have to go through their practitioner to  cancel their prescription  and have it re-issued to a different pharmacy. The process was taxing and time consuming for both patients and practitioners.    The Drug Enforcement Administration (DEA) published its intent to revise the process for transferring electronic prescriptions on October 07, 2020.  The final rule was published in the federal register on June 14, 2022 and went into effect 30 days later.  Under the final rule, a prescription can only be transferred once between pharmacies, and only if allowed under existing state or other applicable law. The prescription must remain in its electronic form; may not be altered in any way; and the transfer must be communicated directly between two licensed pharmacists. It's important to note, any authorized refills transfer with the original prescription, which means the entire prescription will be filled at the same pharmacy."    REFERENCES: 1. DEA website announcement https://www.dea.gov/stories/2023/2023-07/2022-09-01/revised-regulation-allows-dea-registered-pharmacies-transfer  2. Department of Justice website  https://www.govinfo.gov/content/pkg/FR-2022-06-14/pdf/2023-15847.pdf  3. DEPARTMENT OF JUSTICE Drug Enforcement Administration 21 CFR Part 1306 [Docket No. [id number]] RIN [id number] "Transfer of Electronic Prescriptions for Schedules II-V Controlled Substances Between Pharmacies for Initial Filling"  ____________________________________________________________________________________________     _______________________________________________________________________  Medication Rules  Purpose: To inform patients, and their family members, of our medication rules and regulations.  Applies to: All patients receiving prescriptions from our practice (written or electronic).  Pharmacy of record: This is the pharmacy where your electronic prescriptions will be sent. Make sure we have the correct one.  Electronic prescriptions: In  compliance with the [place] Strengthen Opioid Misuse Prevention (STOP) Act of 2017 (Session Law 2017-74/H243), effective November 19, 2018, all controlled substances must be electronically prescribed. Written prescriptions, faxing, or calling prescriptions to a pharmacy will no longer be done.  Prescription refills: These will be provided only during in-person appointments. No medications will be renewed without a "face-to-face" evaluation with your provider. Applies to all prescriptions.  NOTE: The following applies primarily to controlled substances (Opioid* Pain Medications).   Type of encounter (visit): For patients receiving controlled substances, face-to-face visits are required. (Not an option and not up to the patient.)  Patient's responsibilities: Pain Pills: Bring all pain pills to every appointment (except for procedure appointments). Pill Bottles: Bring pills in original pharmacy bottle. Bring bottle, even if empty. Always bring the bottle of the most recent fill.  Medication refills: You are responsible for knowing and keeping track of what medications you are taking and when is it that you will need a refill. The day before your appointment: write a list of all prescriptions that need to be refilled. The day of the appointment: give the list to the admitting nurse. Prescriptions will be written only during appointments. No prescriptions will be written on procedure days. If you forget a medication: it will not be "Called in", "Faxed", or "electronically sent". You will need to get another appointment to get these prescribed. No early refills. Do not call asking to have your prescription filled early. Partial  or short prescriptions: Occasionally your pharmacy may not have enough pills to fill your prescription.  NEVER ACCEPT a partial fill or a prescription that is short of the total amount of pills that you were prescribed.  With controlled substances the law allows 72 hours for  the pharmacy to complete the prescription.  If the prescription is not completed within 72 hours, the pharmacist will require a new prescription to be written. This means that you will be short on your medicine and we WILL NOT send another prescription to complete your original   prescription.  Instead, request the pharmacy to send a carrier to a nearby branch to get enough medication to provide you with your full prescription. Prescription Accuracy: You are responsible for carefully inspecting your prescriptions before leaving our office. Have the discharge nurse carefully go over each prescription with you, before taking them home. Make sure that your name is accurately spelled, that your address is correct. Check the name and dose of your medication to make sure it is accurate. Check the number of pills, and the written instructions to make sure they are clear and accurate. Make sure that you are given enough medication to last until your next medication refill appointment. Taking Medication: Take medication as prescribed. When it comes to controlled substances, taking less pills or less frequently than prescribed is permitted and encouraged. Never take more pills than instructed. Never take the medication more frequently than prescribed.  Inform other Doctors: Always inform, all of your healthcare providers, of all the medications you take. Pain Medication from other Providers: You are not allowed to accept any additional pain medication from any other Doctor or Healthcare provider. There are two exceptions to this rule. (see below) In the event that you require additional pain medication, you are responsible for notifying us, as stated below. Cough Medicine: Often these contain an opioid, such as codeine or hydrocodone. Never accept or take cough medicine containing these opioids if you are already taking an opioid* medication. The combination may cause respiratory failure and death. Medication Agreement:  You are responsible for carefully reading and following our Medication Agreement. This must be signed before receiving any prescriptions from our practice. Safely store a copy of your signed Agreement. Violations to the Agreement will result in no further prescriptions. (Additional copies of our Medication Agreement are available upon request.) Laws, Rules, & Regulations: All patients are expected to follow all Federal and State Laws, Statutes, Rules, & Regulations. Ignorance of the Laws does not constitute a valid excuse.  Illegal drugs and Controlled Substances: The use of illegal substances (including, but not limited to marijuana and its derivatives) and/or the illegal use of any controlled substances is strictly prohibited. Violation of this rule may result in the immediate and permanent discontinuation of any and all prescriptions being written by our practice. The use of any illegal substances is prohibited. Adopted CDC guidelines & recommendations: Target dosing levels will be at or below 60 MME/day. Use of benzodiazepines** is not recommended.  Exceptions: There are only two exceptions to the rule of not receiving pain medications from other Healthcare Providers. Exception #1 (Emergencies): In the event of an emergency (i.e.: accident requiring emergency care), you are allowed to receive additional pain medication. However, you are responsible for: As soon as you are able, call our office [phone], at any time of the day or night, and leave a message stating your name, the date and nature of the emergency, and the name and dose of the medication prescribed. In the event that your call is answered by a member of our staff, make sure to document and save the date, time, and the name of the person that took your information.  Exception #2 (Planned Surgery): In the event that you are scheduled by another doctor or dentist to have any type of surgery or procedure, you are allowed (for a period no  longer than 30 days), to receive additional pain medication, for the acute post-op pain. However, in this case, you are responsible for picking up a copy of   our "Post-op Pain Management for Surgeons" handout, and giving it to your surgeon or dentist. This document is available at our office, and does not require an appointment to obtain it. Simply go to our office during business hours (Monday-Thursday from 8:00 AM to 4:00 PM) (Friday 8:00 AM to 12:00 Noon) or if you have a scheduled appointment with us, prior to your surgery, and ask for it by name. In addition, you are responsible for: calling our office [phone], at any time of the day or night, and leaving a message stating your name, name of your surgeon, type of surgery, and date of procedure or surgery. Failure to comply with your responsibilities may result in termination of therapy involving the controlled substances. Medication Agreement Violation. Following the above rules, including your responsibilities will help you in avoiding a Medication Agreement Violation ("Breaking your Pain Medication Contract").  Consequences:  Not following the above rules may result in permanent discontinuation of medication prescription therapy.  *Opioid medications include: morphine, codeine, oxycodone, oxymorphone, hydrocodone, hydromorphone, meperidine, tramadol, tapentadol, buprenorphine, fentanyl, methadone. **Benzodiazepine medications include: diazepam (Valium), alprazolam (Xanax), clonazepam (Klonopine), lorazepam (Ativan), clorazepate (Tranxene), chlordiazepoxide (Librium), estazolam (Prosom), oxazepam (Serax), temazepam (Restoril), triazolam (Halcion) (Last updated: 09/11/2022) ______________________________________________________________________    ______________________________________________________________________  Medication Recommendations and Reminders  Applies to: All patients receiving prescriptions (written and/or  electronic).  Medication Rules & Regulations: You are responsible for reading, knowing, and following our "Medication Rules" document. These exist for your safety and that of others. They are not flexible and neither are we. Dismissing or ignoring them is an act of "non-compliance" that may result in complete and irreversible termination of such medication therapy. For safety reasons, "non-compliance" will not be tolerated. As with the [place] fundamental legal principle of "ignorance of the law is no defense", we will accept no excuses for not having read and knowing the content of documents provided to you by our practice.  Pharmacy of record:  Definition: This is the pharmacy where your electronic prescriptions will be sent.  We do not endorse any particular pharmacy. It is up to you and your insurance to decide what pharmacy to use.  We do not restrict you in your choice of pharmacy. However, once we write for your prescriptions, we will NOT be re-sending more prescriptions to fix restricted supply problems created by your pharmacy, or your insurance.  The pharmacy listed in the electronic medical record should be the one where you want electronic prescriptions to be sent. If you choose to change pharmacy, simply notify our nursing staff. Changes will be made only during your regular appointments and not over the phone.  Recommendations: Keep all of your pain medications in a safe place, under lock and key, even if you live alone. We will NOT replace lost, stolen, or damaged medication. We do not accept "Police Reports" as proof of medications having been stolen. After you fill your prescription, take 1 week's worth of pills and put them away in a safe place. You should keep a separate, properly labeled bottle for this purpose. The remainder should be kept in the original bottle. Use this as your primary supply, until it runs out. Once it's gone, then you know that you have 1 week's worth of medicine,  and it is time to come in for a prescription refill. If you do this correctly, it is unlikely that you will ever run out of medicine. To make sure that the above recommendation works, it is very important that you make   sure your medication refill appointments are scheduled at least 1 week before you run out of medicine. To do this in an effective manner, make sure that you do not leave the office without scheduling your next medication management appointment. Always ask the nursing staff to show you in your prescription , when your medication will be running out. Then arrange for the receptionist to get you a return appointment, at least 7 days before you run out of medicine. Do not wait until you have 1 or 2 pills left, to come in. This is very poor planning and does not take into consideration that we may need to cancel appointments due to bad weather, sickness, or emergencies affecting our staff. DO NOT ACCEPT A "Partial Fill": If for any reason your pharmacy does not have enough pills/tablets to completely fill or refill your prescription, do not allow for a "partial fill". The law allows the pharmacy to complete that prescription within 72 hours, without requiring a new prescription. If they do not fill the rest of your prescription within those 72 hours, you will need a separate prescription to fill the remaining amount, which we will NOT provide. If the reason for the partial fill is your insurance, you will need to talk to the pharmacist about payment alternatives for the remaining tablets, but again, DO NOT ACCEPT A PARTIAL FILL, unless you can trust your pharmacist to obtain the remainder of the pills within 72 hours.  Prescription refills and/or changes in medication(s):  Prescription refills, and/or changes in dose or medication, will be conducted only during scheduled medication management appointments. (Applies to both, written and electronic prescriptions.) No refills on procedure days. No  medication will be changed or started on procedure days. No changes, adjustments, and/or refills will be conducted on a procedure day. Doing so will interfere with the diagnostic portion of the procedure. No phone refills. No medications will be "called into the pharmacy". No Fax refills. No weekend refills. No Holliday refills. No after hours refills.  Remember:  Business hours are:  Monday to Thursday 8:00 AM to 4:00 PM Provider's Schedule: Keeyon Privitera, MD - Appointments are:  Medication management: Monday and Wednesday 8:00 AM to 4:00 PM Procedure day: Tuesday and Thursday 7:30 AM to 4:00 PM Bilal Lateef, MD - Appointments are:  Medication management: Tuesday and Thursday 8:00 AM to 4:00 PM Procedure day: Monday and Wednesday 7:30 AM to 4:00 PM (Last update: 09/11/2022) ______________________________________________________________________    ____________________________________________________________________________________________  Drug Holidays  What is a "Drug Holiday"? Drug Holiday: is the name given to the process of slowly tapering down and temporarily stopping the pain medication for the purpose of decreasing or eliminating tolerance to the drug.  Benefits Improved effectiveness Decreased required effective dose Improved pain control End dependence on high dose therapy Decrease cost of therapy Uncovering "opioid-induced hyperalgesia". (OIH)  What is "opioid hyperalgesia"? It is a paradoxical increase in pain caused by exposure to opioids. Stopping the opioid pain medication, contrary to the expected, it actually decreases or completely eliminates the pain. Ref.: "A comprehensive review of opioid-induced hyperalgesia". Marion Lee, et.al. Pain Physician. 2011 Mar-Apr;14(2):145-61.  What is tolerance? Tolerance: the progressive loss of effectiveness of a pain medicine due to repetitive use. A common problem of opioid pain medications.  How long should a "Drug  Holiday" last? Effectiveness depends on the patient staying off all opioid pain medicines for a minimum of 14 consecutive days. (2 weeks)  How about just taking less of the medicine? Does not   work. Will not accomplish goal of eliminating the excess receptors.  How about switching to a different pain medicine? (AKA. "Opioid rotation") Does not work. Creates the illusion of effectiveness by taking advantage of inaccurate equivalent dose calculations between different opioids. -This "technique" was promoted by studies funded by pharmaceutical companies, such as [employer], creators of "OxyContin".  Can I stop the medicine "cold turkey"? We do not recommend it. You should always coordinate with your prescribing physician to make the transition as smoothly as possible. Avoid stopping the medicine abruptly without consulting. We recommend a "slow taper".  What is a slow taper? Taper: refers to the gradual decrease in dose.   How do I stop/taper the dose? Slowly. Decrease the daily amount of pills that you take by one (1) pill every seven (7) days. This is called a "slow downward taper". Example: if you normally take four (4) pills per day, drop it to three (3) pills per day for seven (7) days, then to two (2) pills per day for seven (7) days, then to one (1) per day for seven (7) days, and then stop the medicine. The 14 day "Drug Holiday" starts on the first day without medicine.   Will I experience withdrawals? Unlikely with a slow taper.  What triggers withdrawals? Withdrawals are triggered by the sudden/abrupt stop of high dose opioids. Withdrawals can be avoided by slowly decreasing the dose over a prolonged period of time.  What are withdrawals? Symptoms associated with sudden/abrupt reduction/stopping of high-dose, long-term use of pain medication. Withdrawal are seldom seen on low dose therapy, or patients rarely taking opioid medication.  Early Withdrawal Symptoms may  include: Agitation Anxiety Muscle aches Increased tearing Insomnia Runny nose Sweating Yawning  Late symptoms may include: Abdominal cramping Diarrhea Dilated pupils Goose bumps Nausea Vomiting  When could I see withdrawals? Onset: 8-24 hours after last use for most opioids. 12-48 hours for long-acting opioids (i.e.: methadone)  How long could they last? Duration: 4-10 days for most opioids. 14-21 days for long-acting opioids (i.e.: methadone)  What will happen after I complete my "Drug Holiday"? The need and indications for the opioid analgesic will be reviewed before restarting the medication. Dose requirements will likely decrease and the dose will need to be adjusted accordingly.   (Last update: 02/06/2023) ____________________________________________________________________________________________    ____________________________________________________________________________________________  WARNING: CBD (cannabidiol) & Delta (Delta-8 tetrahydrocannabinol) products.   Applicable to:  All individuals currently taking or considering taking CBD (cannabidiol) and, more important, all patients taking opioid analgesic controlled substances (pain medication). (Example: oxycodone; oxymorphone; hydrocodone; hydromorphone; morphine; methadone; tramadol; tapentadol; fentanyl; buprenorphine; butorphanol; dextromethorphan; meperidine; codeine; etc.)  Introduction:  Recently there has been a drive towards the use of "natural" products for the treatment of different conditions, including pain anxiety and sleep disorders. Marijuana and hemp are two varieties of the cannabis genus plants. Marijuana and its derivatives are illegal, while hemp and its derivatives are not. Cannabidiol (CBD) and tetrahydrocannabinol (THC), are two natural compounds found in plants of the Cannabis genus. They can both be extracted from hemp or marijuana. Both compounds interact with your body's endocannabinoid  system in very different ways. CBD is associated with pain relief (analgesia) while THC is associated with the psychoactive effects ("the high") obtained from the use of marijuana products. There are two main types of THC: Delta-9, which comes from the marijuana plant and it is illegal, and Delta-8, which comes from the hemp plant, and it is legal. (Both, Delta-9-THC and Delta-8-THC are psychoactive and   give you "the high".)   Legality:  Marijuana and its derivatives: illegal Hemp and its derivatives: Legal (State dependent) UPDATE: (01/05/2022) The Drug Enforcement Agency (DEA) issued a letter stating that "delta" cannabinoids, including Delta-8-THCO and Delta-9-THCO, synthetically derived from hemp do not qualify as hemp and will be viewed as Schedule I drugs. (Schedule I drugs, substances, or chemicals are defined as drugs with no currently accepted medical use and a high potential for abuse. Some examples of Schedule I drugs are: heroin, lysergic acid diethylamide (LSD), marijuana (cannabis), 3,4-methylenedioxymethamphetamine (ecstasy), methaqualone, and peyote.) (https://www.dea.gov)  Legal status of CBD in [place]:  "Conditionally Legal"  Reference: "FDA Regulation of Cannabis and Cannabis-Derived Products, Including Cannabidiol (CBD)" - https://www.fda.gov/news-events/public-health-focus/fda-regulation-cannabis-and-cannabis-derived-products-including-cannabidiol-cbd  Warning:  CBD is not FDA approved and has not undergo the same manufacturing controls as prescription drugs.  This means that the purity and safety of available CBD may be questionable. Most of the time, despite manufacturer's claims, it is contaminated with THC (delta-9-tetrahydrocannabinol - the chemical in marijuana responsible for the "HIGH").  When this is the case, the THC contaminant will trigger a positive urine drug screen (UDS) test for Marijuana (carboxy-THC).   The FDA recently put out a warning about 5 things that everyone  should be aware of regarding Delta-8 THC: Delta-8 THC products have not been evaluated or approved by the FDA for safe use and may be marketed in ways that put the public health at risk. The FDA has received adverse event reports involving delta-8 THC-containing products. Delta-8 THC has psychoactive and intoxicating effects. Delta-8 THC manufacturing often involve use of potentially harmful chemicals to create the concentrations of delta-8 THC claimed in the marketplace. The final delta-8 THC product may have potentially harmful by-products (contaminants) due to the chemicals used in the process. Manufacturing of delta-8 THC products may occur in uncontrolled or unsanitary settings, which may lead to the presence of unsafe contaminants or other potentially harmful substances. Delta-8 THC products should be kept out of the reach of children and pets.  NOTE: Because a positive UDS for any illicit substance is a violation of our medication agreement, your opioid analgesics (pain medicine) may be permanently discontinued.  MORE ABOUT CBD  General Information: CBD was discovered in 1940 and it is a derivative of the cannabis sativa genus plants (Marijuana and Hemp). It is one of the 113 identified substances found in Marijuana. It accounts for up to 40% of the plant's extract. As of 2018, preliminary clinical studies on CBD included research for the treatment of anxiety, movement disorders, and pain. CBD is available and consumed in multiple forms, including inhalation of smoke or vapor, as an aerosol spray, and by mouth. It may be supplied as an oil containing CBD, capsules, dried cannabis, or as a liquid solution. CBD is thought not to be as psychoactive as THC (delta-9-tetrahydrocannabinol - the chemical in marijuana responsible for the "HIGH"). Studies suggest that CBD may interact with different biological target receptors in the body, including cannabinoid and other neurotransmitter receptors. As of  2018 the mechanism of action for its biological effects has not been determined.  Side-effects  Adverse reactions: Dry mouth, diarrhea, decreased appetite, fatigue, drowsiness, malaise, weakness, sleep disturbances, and others.  Drug interactions:  CBD may interact with medications such as blood-thinners. CBD causes drowsiness on its own and it will increase drowsiness caused by other medications, including antihistamines (such as Benadryl), benzodiazepines (Xanax, Ativan, Valium), antipsychotics, antidepressants, opioids, alcohol and supplements such as kava, melatonin and St. John's Wort.    Other drug interactions: Brivaracetam (Briviact); Caffeine; Carbamazepine (Tegretol); Citalopram (Celexa); Clobazam (Onfi); Eslicarbazepine (Aptiom); Everolimus (Zostress); Lithium; Methadone (Dolophine); Rufinamide (Banzel); Sedative medications (CNS depressants); Sirolimus (Rapamune); Stiripentol (Diacomit); Tacrolimus (Prograf); Tamoxifen ; Soltamox); Topiramate (Topamax); Valproate; Warfarin (Coumadin); Zonisamide. (Last update: 10/29/2022) ____________________________________________________________________________________________   ____________________________________________________________________________________________  Naloxone Nasal Spray  Why am I receiving this medication? [place] STOP ACT requires that all patients taking high dose opioids or at risk of opioids respiratory depression, be prescribed an opioid reversal agent, such as Naloxone (AKA: Narcan).  What is this medication? NALOXONE (nal OX one) treats opioid overdose, which causes slow or shallow breathing, severe drowsiness, or trouble staying awake. Call emergency services after using this medication. You may need additional treatment. Naloxone works by reversing the effects of opioids. It belongs to a group of medications called opioid blockers.  COMMON BRAND NAME(S): Kloxxado, Narcan  What should I tell my care team before  I take this medication? They need to know if you have any of these conditions: Heart disease Substance use disorder An unusual or allergic reaction to naloxone, other medications, foods, dyes, or preservatives Pregnant or trying to get pregnant Breast-feeding  When to use this medication? This medication is to be used for the treatment of respiratory depression (less than 8 breaths per minute) secondary to opioid overdose.   How to use this medication? This medication is for use in the nose. Lay the person on their back. Support their neck with your hand and allow the head to tilt back before giving the medication. The nasal spray should be given into 1 nostril. After giving the medication, move the person onto their side. Do not remove or test the nasal spray until ready to use. Get emergency medical help right away after giving the first dose of this medication, even if the person wakes up. You should be familiar with how to recognize the signs and symptoms of a narcotic overdose. If more doses are needed, give the additional dose in the other nostril. Talk to your care team about the use of this medication in children. While this medication may be prescribed for children as young as newborns for selected conditions, precautions do apply.  Naloxone Overdosage: If you think you have taken too much of this medicine contact a poison control center or emergency room at once.  NOTE: This medicine is only for you. Do not share this medicine with others.  What if I miss a dose? This does not apply.  What may interact with this medication? This is only used during an emergency. No interactions are expected during emergency use. This list may not describe all possible interactions. Give your health care provider a list of all the medicines, herbs, non-prescription drugs, or dietary supplements you use. Also tell them if you smoke, drink alcohol, or use illegal drugs. Some items may interact with  your medicine.  What should I watch for while using this medication? Keep this medication ready for use in the case of an opioid overdose. Make sure that you have the phone number of your care team and local hospital ready. You may need to have additional doses of this medication. Each nasal spray contains a single dose. Some emergencies may require additional doses. After use, bring the treated person to the nearest hospital or call 911. Make sure the treating care team knows that the person has received a dose of this medication. You will receive additional instructions on what to do during and after use of this   medication before an emergency occurs.  What side effects may I notice from receiving this medication? Side effects that you should report to your care team as soon as possible: Allergic reactions--skin rash, itching, hives, swelling of the face, lips, tongue, or throat Side effects that usually do not require medical attention (report these to your care team if they continue or are bothersome): Constipation Dryness or irritation inside the nose Headache Increase in blood pressure Muscle spasms Stuffy nose Toothache This list may not describe all possible side effects. Call your doctor for medical advice about side effects. You may report side effects to FDA at 1-800-FDA-1088.  Where should I keep my medication? Because this is an emergency medication, you should keep it with you at all times.  Keep out of the reach of children and pets. Store between 20 and 25 degrees C (68 and 77 degrees F). Do not freeze. Throw away any unused medication after the expiration date. Keep in original box until ready to use.  NOTE: This sheet is a summary. It may not cover all possible information. If you have questions about this medicine, talk to your doctor, pharmacist, or health care provider.   2023 Elsevier/Gold Standard (2021-07-14  00:00:00)  ____________________________________________________________________________________________   

## 2023-03-27 ENCOUNTER — Encounter: Payer: Self-pay | Admitting: Pain Medicine

## 2023-03-27 ENCOUNTER — Ambulatory Visit: Payer: Medicare PPO | Attending: Pain Medicine | Admitting: Pain Medicine

## 2023-03-27 DIAGNOSIS — Z79891 Long term (current) use of opiate analgesic: Secondary | ICD-10-CM | POA: Diagnosis present

## 2023-03-27 DIAGNOSIS — G8929 Other chronic pain: Secondary | ICD-10-CM | POA: Diagnosis present

## 2023-03-27 DIAGNOSIS — M47812 Spondylosis without myelopathy or radiculopathy, cervical region: Secondary | ICD-10-CM | POA: Diagnosis present

## 2023-03-27 DIAGNOSIS — G894 Chronic pain syndrome: Secondary | ICD-10-CM | POA: Diagnosis not present

## 2023-03-27 DIAGNOSIS — M5136 Other intervertebral disc degeneration, lumbar region: Secondary | ICD-10-CM | POA: Diagnosis not present

## 2023-03-27 DIAGNOSIS — M48062 Spinal stenosis, lumbar region with neurogenic claudication: Secondary | ICD-10-CM | POA: Insufficient documentation

## 2023-03-27 DIAGNOSIS — M0579 Rheumatoid arthritis with rheumatoid factor of multiple sites without organ or systems involvement: Secondary | ICD-10-CM | POA: Diagnosis present

## 2023-03-27 DIAGNOSIS — M47816 Spondylosis without myelopathy or radiculopathy, lumbar region: Secondary | ICD-10-CM | POA: Diagnosis present

## 2023-03-27 DIAGNOSIS — M542 Cervicalgia: Secondary | ICD-10-CM | POA: Insufficient documentation

## 2023-03-27 DIAGNOSIS — Z79899 Other long term (current) drug therapy: Secondary | ICD-10-CM | POA: Insufficient documentation

## 2023-03-27 DIAGNOSIS — M7918 Myalgia, other site: Secondary | ICD-10-CM | POA: Diagnosis present

## 2023-03-27 DIAGNOSIS — M545 Low back pain, unspecified: Secondary | ICD-10-CM | POA: Diagnosis present

## 2023-03-27 DIAGNOSIS — M25551 Pain in right hip: Secondary | ICD-10-CM | POA: Diagnosis present

## 2023-03-27 MED ORDER — HYDROCODONE-ACETAMINOPHEN 10-325 MG PO TABS: 1.0000 | ORAL_TABLET | Freq: Three times a day (TID) | ORAL | 0 refills | Status: DC

## 2023-03-27 NOTE — Progress Notes (Signed)
Nursing Pain Medication Assessment:  Safety precautions to be maintained throughout the outpatient stay will include: orient to surroundings, keep bed in low position, maintain call bell within reach at all times, provide assistance with transfer out of bed and ambulation.  Medication Inspection Compliance: Pill count conducted under aseptic conditions, in front of the patient. Neither the pills nor the bottle was removed from the patient's sight at any time. Once count was completed pills were immediately returned to the patient in their original bottle.  Medication: Hydrocodone/APAP Pill/Patch Count:  45 of 90 pills remain Pill/Patch Appearance: Markings consistent with prescribed medication Bottle Appearance: Standard pharmacy container. Clearly labeled. Filled Date:  0 4 / 22 / 2024 Last Medication intake:  Today States has some pills at home in pill box

## 2023-04-03 IMAGING — CR DG CHEST 2V
1 series · 2 of 2 positions shown · non-contrast
Comparison: 10/27/2015

CLINICAL DATA: Dyspnea on exertion worse recently over the past few
months. [id number] positive June 2020.

EXAM:
CHEST - 2 VIEW

[Series 1: dg chest 2 view · 0.14mm/px · 2 of 2 slices shown]
[im 1/2]
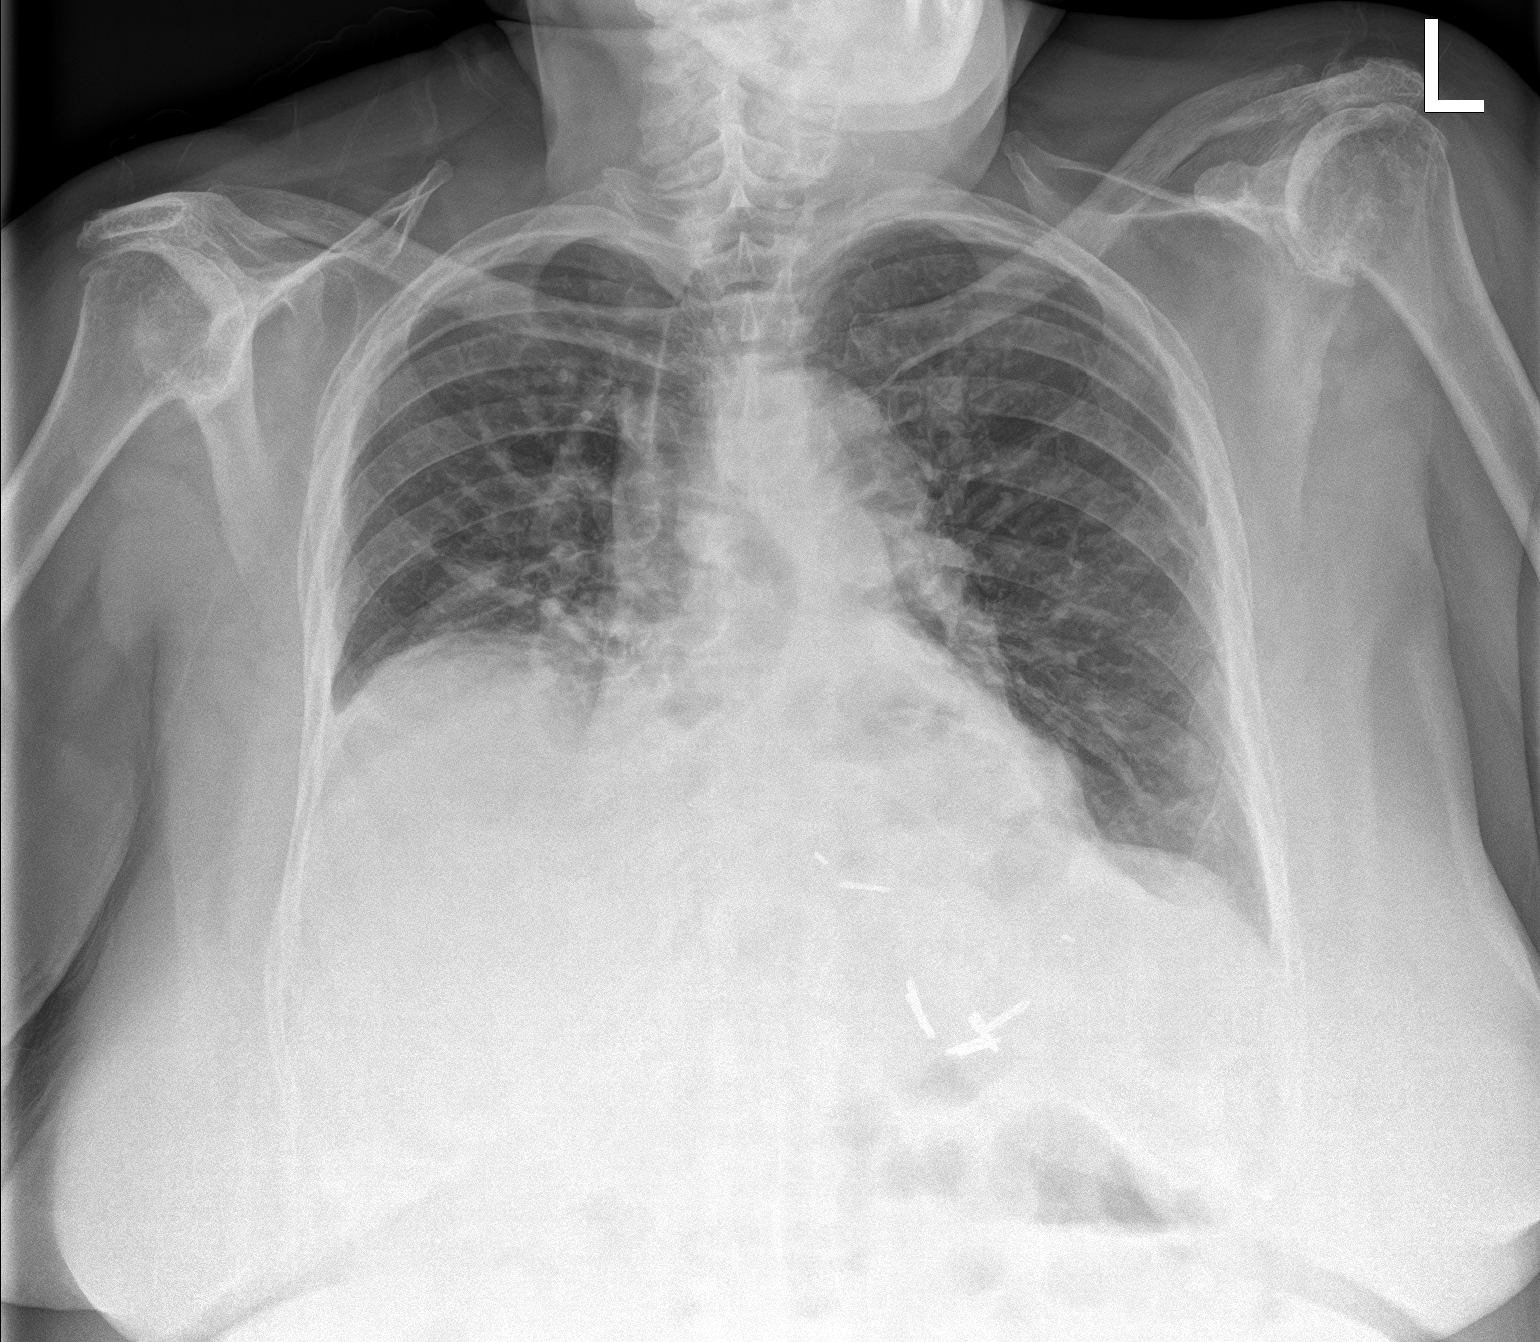
[im 2/2]
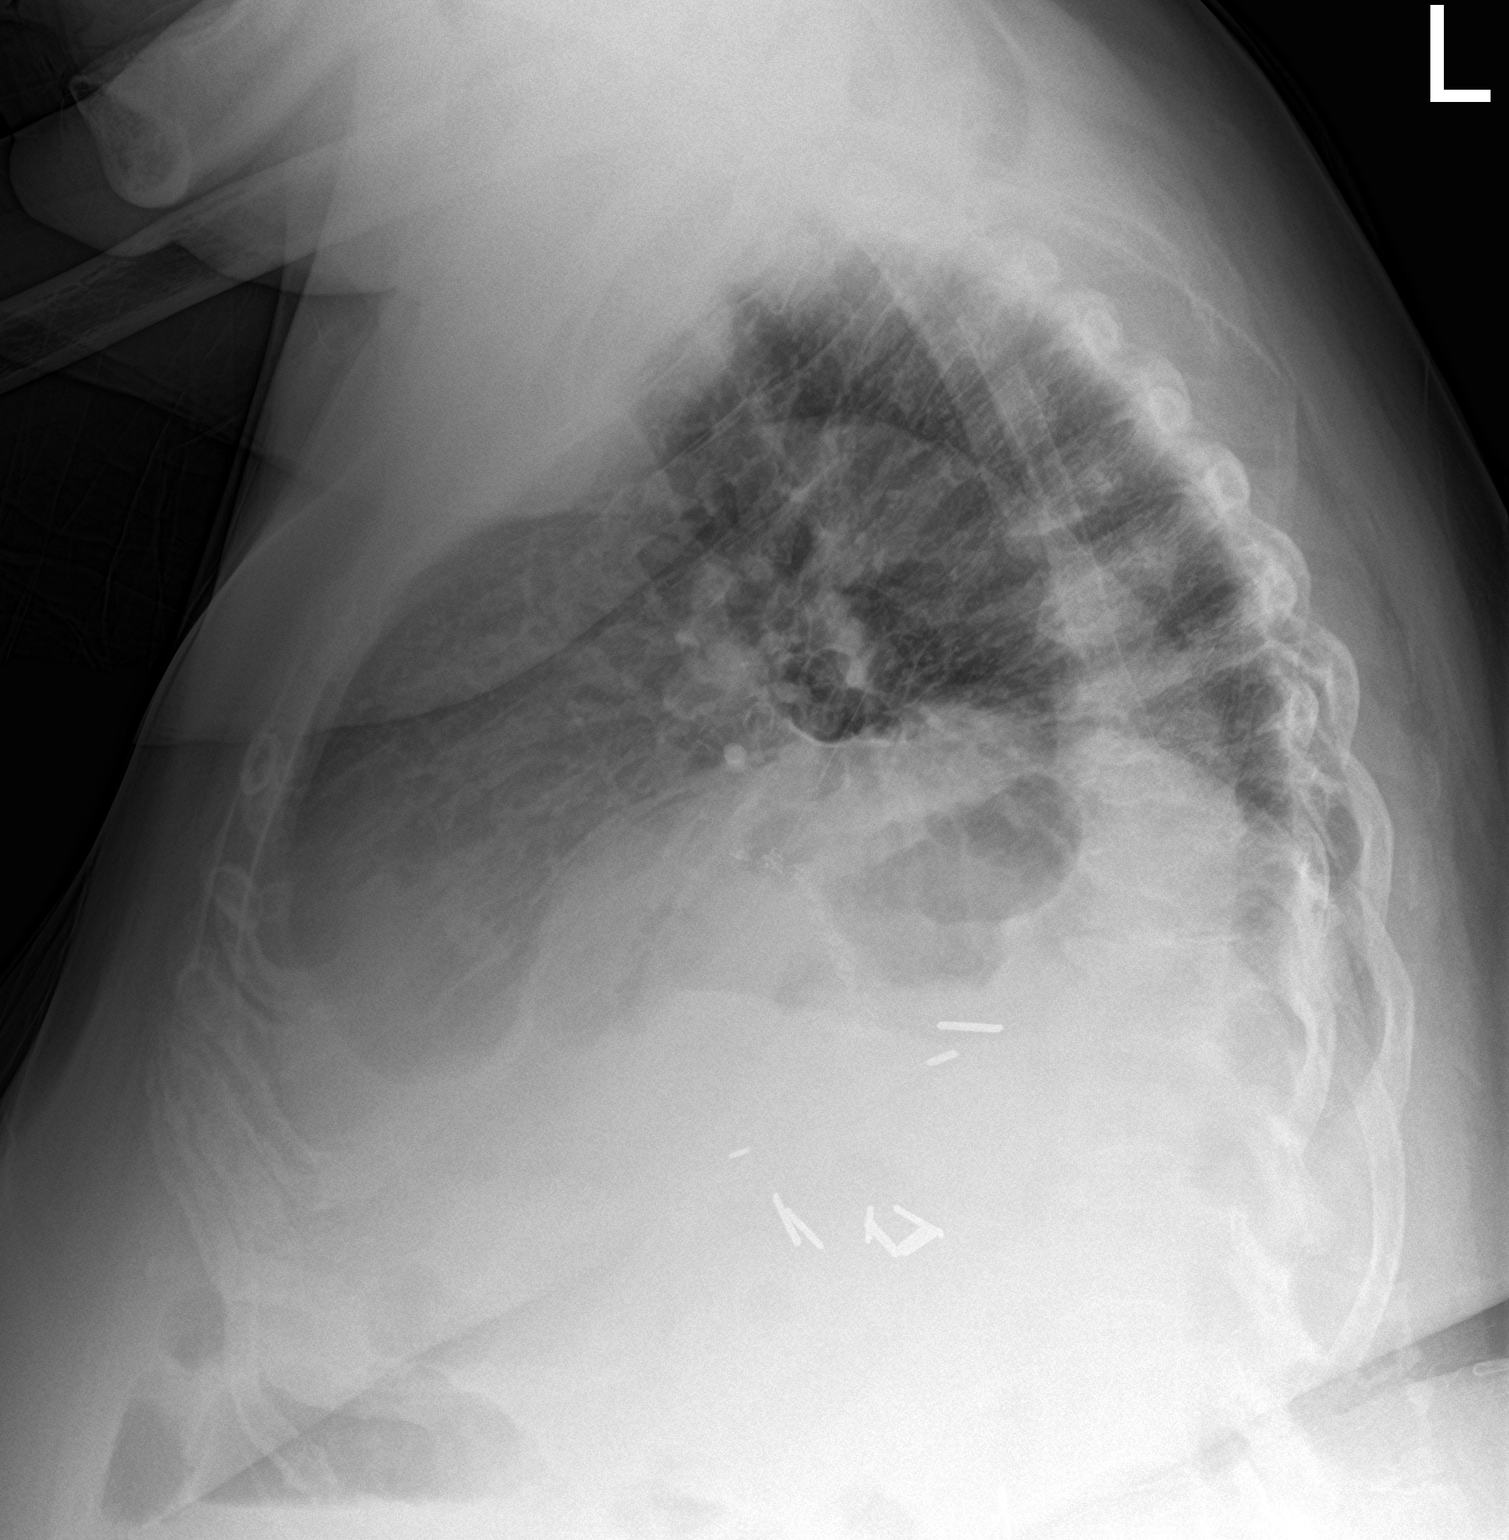

[2 of 2 positions shown; findings below may reference images not displayed]

FINDINGS: Lungs are hypoinflated with moderate elevation of the right
hemidiaphragm new since 8292. There is heterogeneous density over
the retrocardiac region likely due in part to moderate hiatal
hernia. No definite pulmonary consolidation or effusion.
Cardiomediastinal silhouette and remainder of the exam is unchanged.
Surgical clips over the left upper quadrant and region of the
gastroesophageal junction.
IMPRESSION: 1. Hypoinflation without acute cardiopulmonary disease.
2. Moderate elevation of the right hemidiaphragm new since 8292.
Findings suggesting a moderate size hiatal hernia.

## 2023-04-11 IMAGING — RF DG SNIFF TEST
4 series · 15 of 16 positions shown · non-contrast
Comparison: CT chest 11/23/2009

CLINICAL DATA: Worsening breathing since [id number] infection in
[REDACTED].

EXAM:
CHEST FLUOROSCOPY
TECHNIQUE: Real-time fluoroscopic evaluation of the chest was performed.
FLUOROSCOPY TIME:  Fluoroscopy Time:  0.6 minute
Radiation Exposure Index (if provided by the fluoroscopic device):
6.4 mGy
Number of Acquired Spot Images: 0

[Series 1: cp_chest · 0.25mm/px · 4 of 68 frames shown (1 of 4)]
[frame 11/68]
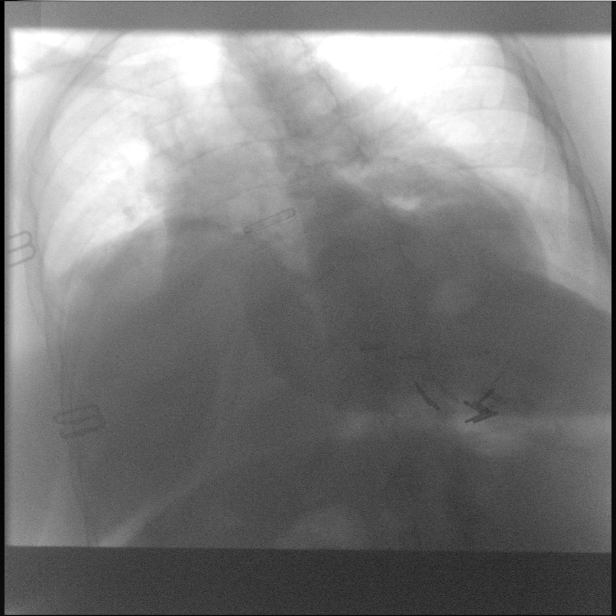
[frame 34/68]
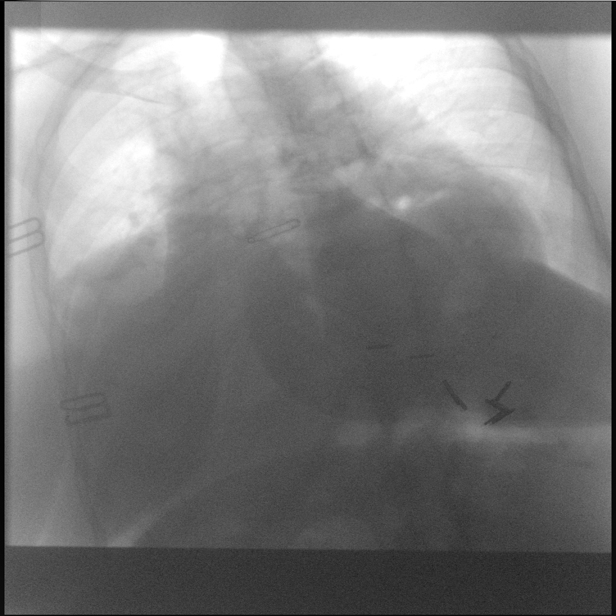
[frame 35/68]
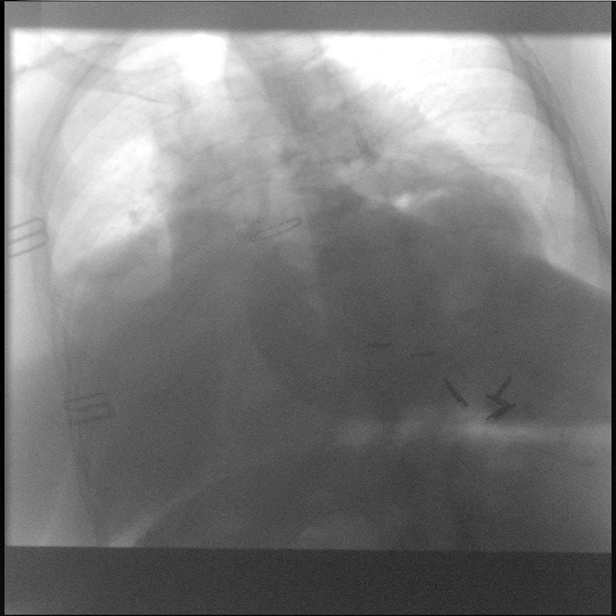
[frame 58/68]
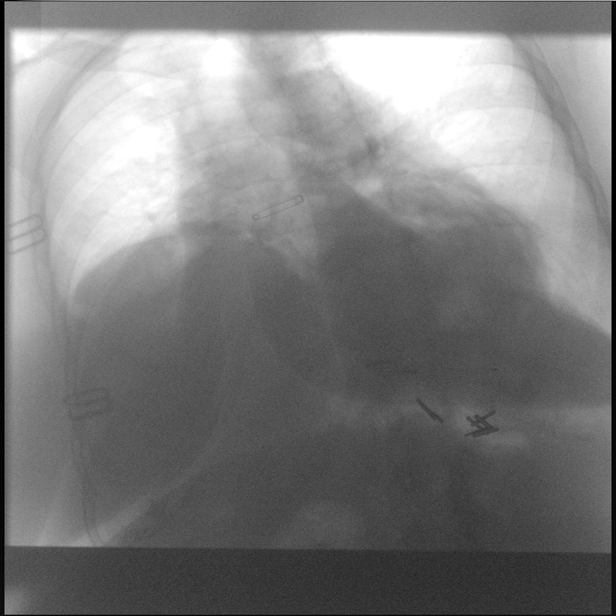

[Series 2: cp_chest · 0.25mm/px · 3 of 98 frames shown (2 of 4)]
[frame 15/98]
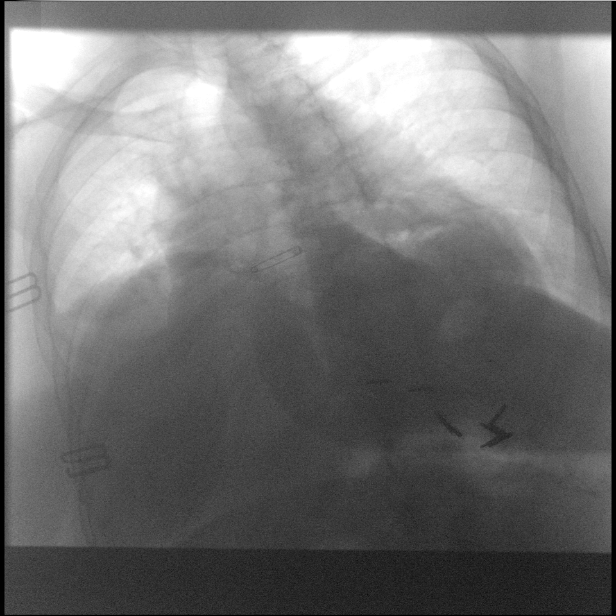
[frame 50/98]
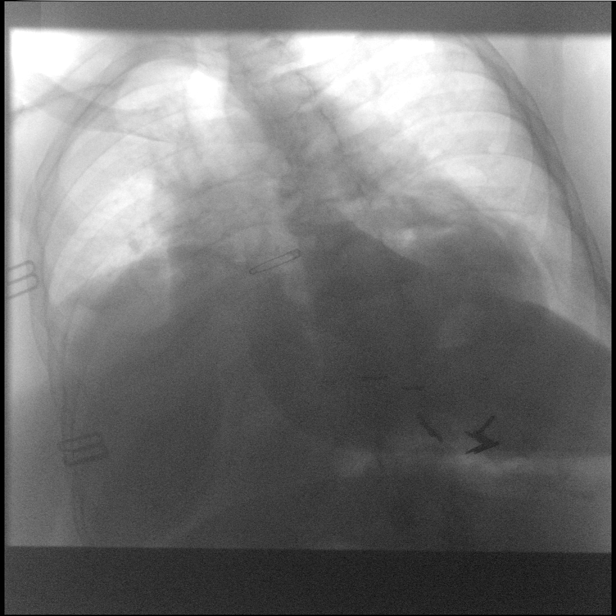
[frame 84/98]
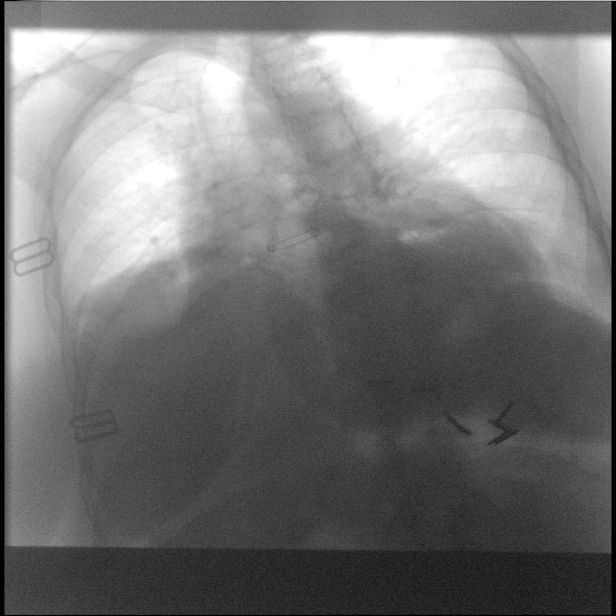

[Series 3: cp_chest · 0.25mm/px · 4 of 50 frames shown (3 of 4)]
[frame 1/50]
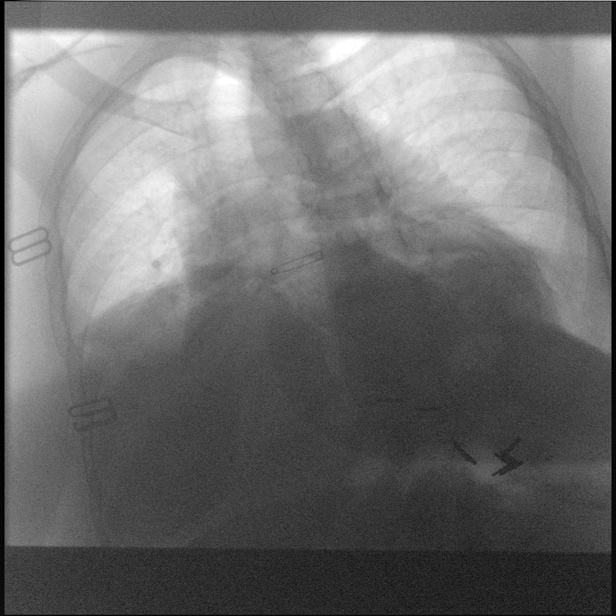
[frame 8/50]
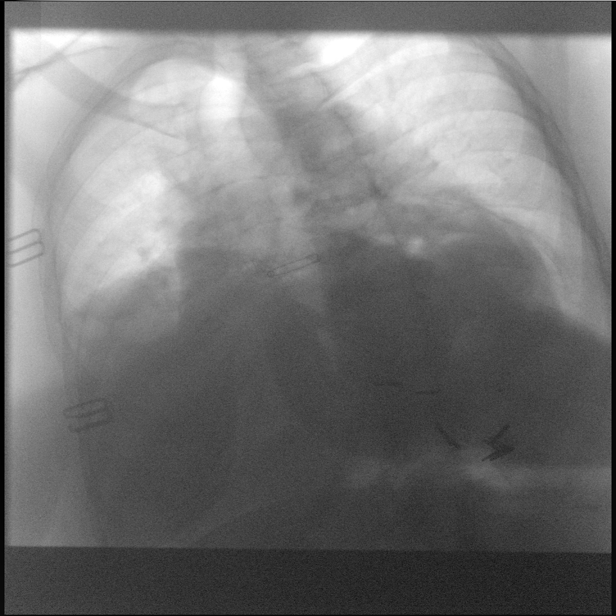
[frame 26/50]
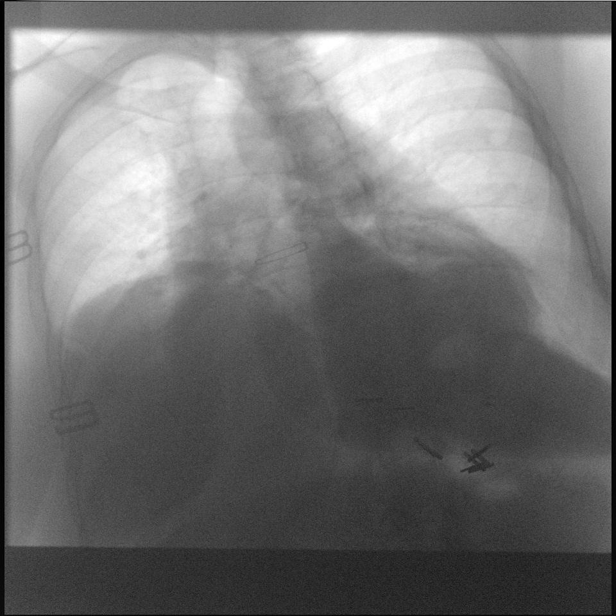
[frame 43/50]
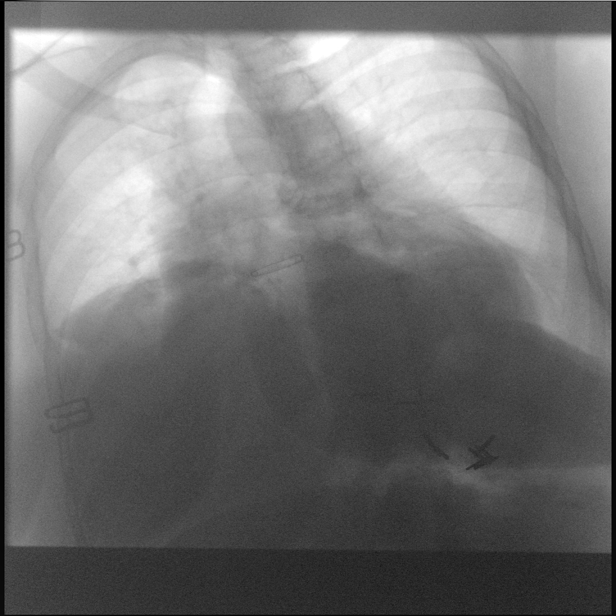

[Series 4: cp_chest · 0.25mm/px · 4 of 33 frames shown (4 of 4)]
[frame 2/33]
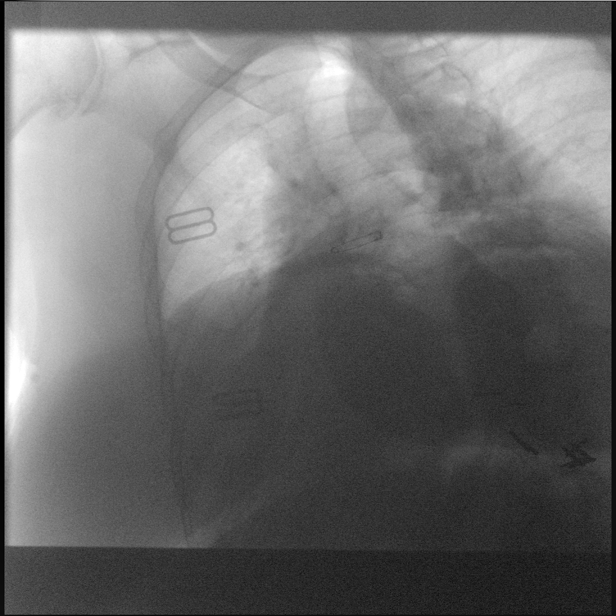
[frame 5/33]
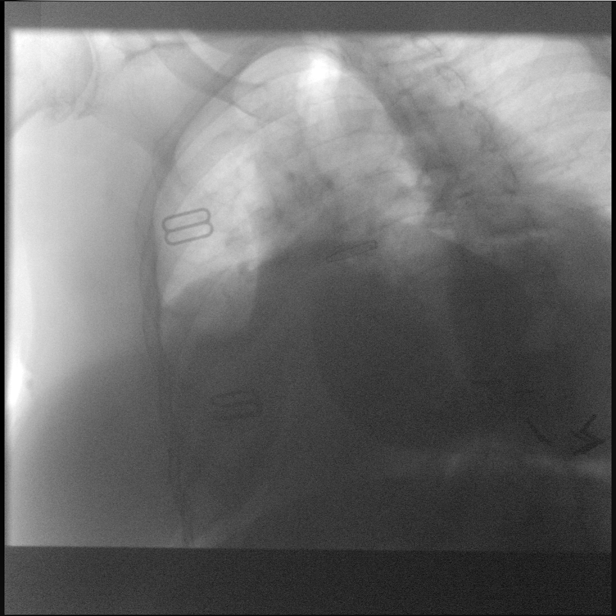
[frame 17/33]
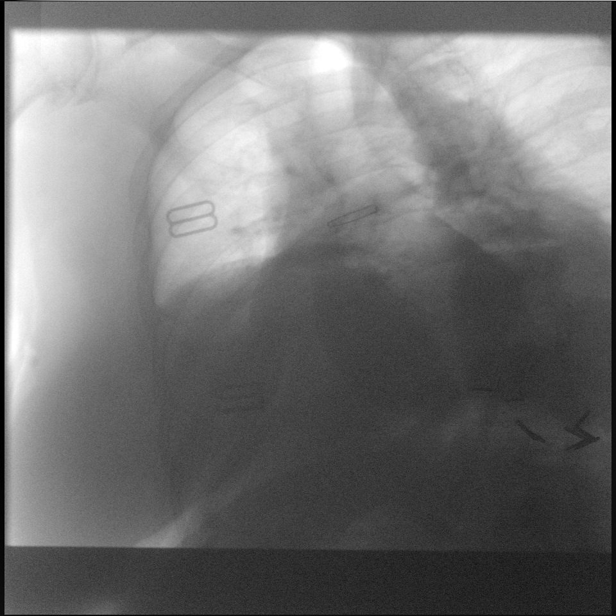
[frame 29/33]
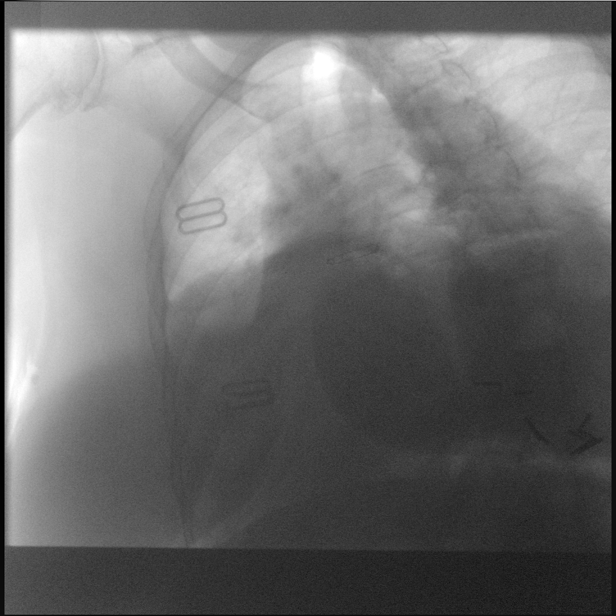

[15 of 16 positions shown; findings below may reference images not displayed]

FINDINGS: Real-time fluoroscopy of the diaphragms was performed during
inspiration, expiration and sniffing.

Elevation of the right diaphragm unchanged compared 11/23/2009.

The diaphragm moves appropriately during inspiration, expiration and
sniffing, but less than expected.

Right basilar atelectasis.
IMPRESSION: Normal motion of the diaphragms, but the overall excursion is
relatively decreased.

## 2023-04-16 ENCOUNTER — Ambulatory Visit
Admission: RE | Admit: 2023-04-16 | Discharge: 2023-04-16 | Disposition: A | Discharge status: Home / Self Care | Payer: Medicare PPO | Source: Ambulatory Visit | Attending: Pain Medicine | Admitting: Pain Medicine

## 2023-04-16 DIAGNOSIS — M48062 Spinal stenosis, lumbar region with neurogenic claudication: Secondary | ICD-10-CM

## 2023-04-16 DIAGNOSIS — M47816 Spondylosis without myelopathy or radiculopathy, lumbar region: Secondary | ICD-10-CM

## 2023-04-16 DIAGNOSIS — M5136 Other intervertebral disc degeneration, lumbar region: Secondary | ICD-10-CM | POA: Insufficient documentation

## 2023-04-16 DIAGNOSIS — M545 Low back pain, unspecified: Secondary | ICD-10-CM | POA: Insufficient documentation

## 2023-04-16 DIAGNOSIS — G8929 Other chronic pain: Secondary | ICD-10-CM | POA: Insufficient documentation

## 2023-04-24 ENCOUNTER — Telehealth: Payer: Self-pay | Admitting: *Deleted

## 2023-04-24 ENCOUNTER — Telehealth: Payer: Self-pay | Admitting: Pain Medicine

## 2023-04-24 NOTE — Telephone Encounter (Signed)
Patient is complaining of spasms in her left hip.  States she is not taking any muscle relaxers at this time. Wanted to know if there was anyuthing she could take.  She has not started PT yet because they are backed up and havent scheduled her yet.

## 2023-04-24 NOTE — Telephone Encounter (Signed)
Patient notified and states Dr Laban Emperor had already told her about all of that.  She states she will call her PCP to see if they will order a muscle relaxer.

## 2023-04-24 NOTE — Telephone Encounter (Signed)
-----   Message from Delano Metz, MD sent at 04/24/2023  9:08 AM EDT ----- Regarding: X-ray results Please make sure that these results have been made available to the patient's PCP.  Call the patient with the results and let her know that the filter that she has in the inferior vena cava needs to be evaluated by her PCP and perhaps a vascular surgeon.  She may need a consult with interventional radiology to see if there is anything that they can do for her.  This is outside of my scope of practice.  ----- Message ----- From: Interface, Rad Results In Sent: 04/21/2023   8:45 AM EDT To: Delano Metz, MD

## 2023-04-24 NOTE — Telephone Encounter (Signed)
Results read to patient, also called PCP, faxed results to the office of [facility]. They will ensure that the physician sees the x-ray result/

## 2023-04-24 NOTE — Telephone Encounter (Signed)
Patient states she had her xrays done on 04-16-23. She is experiencing a lot of back spasms and left hip pain. Would like to talk to someone about this

## 2023-07-02 NOTE — Progress Notes (Unsigned)
PROVIDER NOTE: Information contained herein reflects review and annotations entered in association with encounter. Interpretation of such information and data should be left to medically-trained personnel. Information provided to patient can be located elsewhere in the medical record under "Patient Instructions". Document created using STT-dictation technology, any transcriptional errors that may result from process are unintentional.    Patient: Mariah Poole  Service Category: E/M  Provider: Oswaldo Done, MD  DOB: October 11, 1946  DOS: 07/03/2023  Referring Provider: Leanna Sato, MD  MRN: [id number]  Specialty: Interventional Pain Management  PCP: Leanna Sato, MD  Type: Established Patient  Setting: Ambulatory outpatient    Location: Office  Delivery: Face-to-face     HPI  Mariah Poole, a 77 y.o. year old female, is here today because of her No primary diagnosis found.. Mariah Poole's primary complain today is No chief complaint on file.  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, L4-5, L5-S1) w/ neurogenic claudication; Lumbar foraminal stenosis (Multilevel) (Bilateral); Cervicalgia; DDD (degenerative disc disease), cervical; Cervical facet hypertrophy; Cervical facet joint syndrome; Lumbar facet hypertrophy; 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 Pain Assessment: Severity of   is reported as a  /10. Location:    / . Onset:  . Quality:  . Timing:  . Modifying factor(s):  Marland Kitchen Vitals:  vitals were not taken for this visit.  BMI: Estimated body mass index is 36.13 kg/m as calculated from the following:   Height as of 03/27/23: 5' (1.524 m).   Weight as of 03/27/23: 185 lb (83.9 kg). Last encounter: 03/27/2023. Last procedure: Visit date not found.  Reason for encounter: medication management. ***  Routine UDS ordered today.   RTCB:   Pharmacotherapy Assessment  Analgesic: Hydrocodone/APAP 10/325, 1 tab PO q 8 hrs PRN (30 mg/day of hydrocodone) (975 mg/day of acetaminophen) MME/day: 30 mg/day.   Monitoring: [place] PMP: PDMP reviewed during this encounter.       Pharmacotherapy: No side-effects or adverse reactions reported. Compliance: No problems identified. Effectiveness: Clinically acceptable.  No notes on file  No results found for: "CBDTHCR" No results found for: "D8THCCBX" No results found for: "D9THCCBX"  UDS:  Summary  Date Value Ref Range Status  07/04/2022 Note  Final    Comment:    ==================================================================== [employer] [facility] 13 (MW) ==================================================================== Test                             Result       Flag       Units  Drug Present and Declared for Prescription Verification   Hydrocodone                    2513         EXPECTED  ng/mg creat   Hydromorphone                  290          EXPECTED   ng/mg creat   Dihydrocodeine                 711          EXPECTED   ng/mg creat   Norhydrocodone                 >5000        EXPECTED   ng/mg creat    Sources of hydrocodone  include scheduled prescription medications.    Hydromorphone, dihydrocodeine and norhydrocodone are expected    metabolites of hydrocodone. Hydromorphone and dihydrocodeine are    also available as scheduled prescription medications.  ==================================================================== Test                      Result    Flag   Units      Ref Range   Creatinine              100              mg/dL      >=96 ==================================================================== Declared Medications:  The flagging and interpretation on this report are based on the  following declared medications.  Unexpected results may arise from  inaccuracies in the declared medications.   **Note: The testing scope of this panel includes these medications:   Hydrocodone (Norco)   **Note: The testing scope of this panel does not include the  following reported medications:   Acetaminophen (Norco)  Albuterol (Ventolin HFA)  Atenolol (Tenormin)  Budesonide (Symbicort)  Duloxetine (Cymbalta)  Famotidine (Pepcid)  Formoterol (Symbicort)  Furosemide (Lasix)  Iron  Losartan (Cozaar)  Pantoprazole (Protonix)  Potassium (Klor-Con)  Pravastatin (Pravachol)  Spironolactone (Aldactone)  Turmeric  Vitamin C  Vitamin D3 ==================================================================== For clinical consultation, please call [phone]. ====================================================================       ROS  Constitutional: Denies any fever or chills Gastrointestinal: No reported hemesis, hematochezia, vomiting, or acute GI distress Musculoskeletal: Denies any acute onset joint swelling, redness, loss of ROM, or weakness Neurological: No reported episodes of acute onset apraxia, aphasia, dysarthria, agnosia, amnesia, paralysis, loss of coordination, or loss of consciousness  Medication Review  DULoxetine, HYDROcodone-acetaminophen, Lidocaine, Semaglutide(0.25 or  0.5MG /DOS), Turmeric Curcumin, Vitamin D3, albuterol, ascorbic acid, atenolol, budesonide-formoterol, famotidine, ferrous sulfate, furosemide, losartan, magnesium oxide, meloxicam, montelukast, naloxone, pantoprazole, potassium chloride SA, pravastatin, and spironolactone  History Review  Allergy: Mariah Poole is allergic to contrast media [iodinated contrast media], ioxaglate, and shellfish allergy. Drug: Mariah Poole  reports current drug use. Alcohol:  reports no history of alcohol use. Tobacco:  reports that she has never smoked. She has never used smokeless tobacco. Social: Mariah Poole  reports that she has never smoked. She has never used smokeless tobacco. She reports current drug use. She reports that she does not drink alcohol. Medical:  has a past medical history of Anemia, Arthritis, COPD (chronic obstructive pulmonary disease) (HCC), Depression, Diabetes mellitus without complication (HCC), GERD (gastroesophageal reflux disease), Hiatal hernia, History of contrast media allergy. (IVP dye) (09/01/2015), History of hiatal hernia, Hypercholesteremia, Hypertension, Morbid obesity (HCC), Scoliosis, and Shortness of breath dyspnea. Surgical: Mariah Poole  has a past surgical history that includes Abdominal hysterectomy; Cataract extraction w/ intraocular lens implant (Right); Gastric bypass; Hernia repair; Cataract extraction w/PHACO (Left, 07/19/2015); Cholecystectomy; and Hernia repair. Family: family  history includes Diabetes in her mother; Heart disease in her father.  Laboratory Chemistry Profile   Renal Lab Results  Component Value Date   BUN 14 09/14/2021   CREATININE 1.02 (H) 09/14/2021   BCR 21 06/17/2019   GFRAA 53 (L) 06/17/2019   GFRNONAA 57 (L) 09/14/2021    Hepatic Lab Results  Component Value Date   AST 19 09/14/2021   ALT 11 09/14/2021   ALBUMIN 3.8 09/14/2021   ALKPHOS 66 09/14/2021   LIPASE 19 10/27/2015    Electrolytes Lab Results  Component Value Date   NA 140  09/14/2021   K 4.3 09/14/2021   CL 102 09/14/2021   CALCIUM 9.3 09/14/2021   MG 1.9 06/17/2019    Bone Lab Results  Component Value Date   25OHVITD1 45 06/17/2019   25OHVITD2 <1.0 06/17/2019   25OHVITD3 45 06/17/2019    Inflammation (CRP: Acute Phase) (ESR: Chronic Phase) Lab Results  Component Value Date   CRP 7 06/17/2019   ESRSEDRATE 49 (H) 06/17/2019   LATICACIDVEN 4.0 (H) 11/23/2009         Note: Above Lab results reviewed.  Recent Imaging Review  DG Lumbar Spine Complete W/Bend CLINICAL DATA:  Degenerative disc disease  Chronic bilateral low back pain without sciatica  EXAM: LUMBAR SPINE - COMPLETE WITH BENDING VIEWS  COMPARISON:  01/06/2013  FINDINGS: Multiple surgical clips seen in the left upper quadrant. IVC filter seen in the right paraspinal region. Fractured leg of the IVC filter is seen more superiorly at the L2-L3 level.  Grade 1 anterolisthesis of L5 on S1 which does not significantly change with flexion or extension. There is development of grade 1 anterolisthesis of L3 on L4 and L4 on L5 with flexion as well as extension, which improves with neutral positioning. Severe disc height loss and endplate degenerative changes again seen at L4-L5 and L5-S1. Advanced disc height loss and endplate degenerative changes seen throughout the visualized lower thoracic spine. Moderate facet degenerative changes seen throughout the lumbar spine, greatest at L5-S1.  IMPRESSION: 1. Multilevel degenerative changes of the lumbar spine, greatest at L5-S1. 2. IVC filter seen in the paraspinal region at L3-L4, now with a fractured limb located more superiorly at L2-L3. Interventional radiology consultation should be considered for filter extraction.  Electronically Signed   By: Acquanetta Belling M.D.   On: 04/21/2023 08:42 Note: Reviewed        Physical Exam  General appearance: Well nourished, well developed, and well hydrated. In no apparent acute distress Mental  status: Alert, oriented x 3 (person, place, & time)       Respiratory: No evidence of acute respiratory distress Eyes: PERLA Vitals: There were no vitals taken for this visit. BMI: Estimated body mass index is 36.13 kg/m as calculated from the following:   Height as of 03/27/23: 5' (1.524 m).   Weight as of 03/27/23: 185 lb (83.9 kg). Ideal: Patient weight not recorded  Assessment   Diagnosis Status  No diagnosis found. Controlled Controlled Controlled   Updated Problems: No problems updated.  Plan of Care  Problem-specific:  No problem-specific Assessment & Plan notes found for this encounter.  Mariah Poole has a current medication list which includes the following long-term medication(s): albuterol, atenolol, duloxetine, famotidine, ferosul, furosemide, hydrocodone-acetaminophen, hydrocodone-acetaminophen, hydrocodone-acetaminophen, hydrocodone-acetaminophen, montelukast, pantoprazole, potassium chloride sa, pravastatin, spironolactone, and symbicort.  Pharmacotherapy (Medications Ordered): No orders of the defined types were placed in this encounter.  Orders:  No orders of the defined types were  placed in this encounter.  Follow-up plan:   No follow-ups on file.      Interventional Therapies  Risk  Complexity Considerations:   Allergy: CONTRAST  COPD  BA  CKD  SOBOE  GERD  Jehovah's witness  Hx. intestinal obstruction  CHF  HTN  DM  MO (BMI>30)  No RFA until patient brings BMI down to less than 30 kg/m   Planned  Pending:   Therapeutic bilateral lumbar facet MBB #4    Under consideration:   Possible bilateral lumbar facet RFA #1 (on hold until BMI is brought down to 30 kg/m)   Completed:   Diagnostic bilateral lumbar facet MBB x3 (01/18/2022) (5/10 to 0/10) (100/100/75/75-100)    Therapeutic  Palliative (PRN) options:   Therapeutic/palliative bilateral lumbar facet MBB   Pharmacotherapy  Nonopioids transferred 11/09/2020: Magnesium, Lyrica,  and Mobic.         Recent Visits No visits were found meeting these conditions. Showing recent visits within past 90 days and meeting all other requirements Future Appointments Date Type Provider Dept  07/03/23 Appointment Delano Metz, MD [facility]  Showing future appointments within next 90 days and meeting all other requirements  I discussed the assessment and treatment plan with the patient. The patient was provided an opportunity to ask questions and all were answered. The patient agreed with the plan and demonstrated an understanding of the instructions.  Patient advised to call back or seek an in-person evaluation if the symptoms or condition worsens.  Duration of encounter: *** minutes.  Total time on encounter, as per AMA guidelines included both the face-to-face and non-face-to-face time personally spent by the physician and/or other qualified health care professional(s) on the day of the encounter (includes time in activities that require the physician or other qualified health care professional and does not include time in activities normally performed by clinical staff). Physician's time may include the following activities when performed: Preparing to see the patient (e.g., pre-charting review of records, searching for previously ordered imaging, lab work, and nerve conduction tests) Review of prior analgesic pharmacotherapies. Reviewing PMP Interpreting ordered tests (e.g., lab work, imaging, nerve conduction tests) Performing post-procedure evaluations, including interpretation of diagnostic procedures Obtaining and/or reviewing separately obtained history Performing a medically appropriate examination and/or evaluation Counseling and educating the patient/family/caregiver Ordering medications, tests, or procedures Referring and communicating with other health care professionals (when not separately reported) Documenting clinical information in the  electronic or other health record Independently interpreting results (not separately reported) and communicating results to the patient/ family/caregiver Care coordination (not separately reported)  Note by: Oswaldo Done, MD Date: 07/03/2023; Time: 7:56 AM

## 2023-07-02 NOTE — Patient Instructions (Signed)
 ____________________________________________________________________________________________  Opioid Pain Medication Update  To: All patients taking opioid pain medications. (I.e.: hydrocodone, hydromorphone, oxycodone, oxymorphone, morphine, codeine, methadone, tapentadol, tramadol, buprenorphine, fentanyl, etc.)  Re: Updated review of side effects and adverse reactions of opioid analgesics, as well as new information about long term effects of this class of medications.  Direct risks of long-term opioid therapy are not limited to opioid addiction and overdose. Potential medical risks include serious fractures, breathing problems during sleep, hyperalgesia, immunosuppression, chronic constipation, bowel obstruction, myocardial infarction, and tooth decay secondary to xerostomia.  Unpredictable adverse effects that can occur even if you take your medication correctly: Cognitive impairment, respiratory depression, and death. Most people think that if they take their medication "correctly", and "as instructed", that they will be safe. Nothing could be farther from the truth. In reality, a significant amount of recorded deaths associated with the use of opioids has occurred in individuals that had taken the medication for a long time, and were taking their medication correctly. The following are examples of how this can happen: Patient taking his/her medication for a long time, as instructed, without any side effects, is given a certain antibiotic or another unrelated medication, which in turn triggers a "Drug-to-drug interaction" leading to disorientation, cognitive impairment, impaired reflexes, respiratory depression or an untoward event leading to serious bodily harm or injury, including death.  Patient taking his/her medication for a long time, as instructed, without any side effects, develops an acute impairment of liver and/or kidney function. This will lead to a rapid inability of the body to  breakdown and eliminate their pain medication, which will result in effects similar to an "overdose", but with the same medicine and dose that they had always taken. This again may lead to disorientation, cognitive impairment, impaired reflexes, respiratory depression or an untoward event leading to serious bodily harm or injury, including death.  A similar problem will occur with patients as they grow older and their liver and kidney function begins to decrease as part of the aging process.  Background information: Historically, the original case for using long-term opioid therapy to treat chronic noncancer pain was based on safety assumptions that subsequent experience has called into question. In 1996, the American Pain Society and the American Academy of Pain Medicine issued a consensus statement supporting long-term opioid therapy. This statement acknowledged the dangers of opioid prescribing but concluded that the risk for addiction was low; respiratory depression induced by opioids was short-lived, occurred mainly in opioid-naive patients, and was antagonized by pain; tolerance was not a common problem; and efforts to control diversion should not constrain opioid prescribing. This has now proven to be wrong. Experience regarding the risks for opioid addiction, misuse, and overdose in community practice has failed to support these assumptions.  According to the Centers for Disease Control and Prevention, fatal overdoses involving opioid analgesics have increased sharply over the past decade. Currently, more than 96,700 people die from drug overdoses every year. Opioids are a factor in 7 out of every 10 overdose deaths. Deaths from drug overdose have surpassed motor vehicle accidents as the leading cause of death for individuals between the ages of 80 and 61.  Clinical data suggest that neuroendocrine dysfunction may be very common in both men and women, potentially causing hypogonadism, erectile  dysfunction, infertility, decreased libido, osteoporosis, and depression. Recent studies linked higher opioid dose to increased opioid-related mortality. Controlled observational studies reported that long-term opioid therapy may be associated with increased risk for cardiovascular events. Subsequent meta-analysis concluded  that the safety of long-term opioid therapy in elderly patients has not been proven.   Side Effects and adverse reactions: Common side effects: Drowsiness (sedation). Dizziness. Nausea and vomiting. Constipation. Physical dependence -- Dependence often manifests with withdrawal symptoms when opioids are discontinued or decreased. Tolerance -- As you take repeated doses of opioids, you require increased medication to experience the same effect of pain relief. Respiratory depression -- This can occur in healthy people, especially with higher doses. However, people with COPD, asthma or other lung conditions may be even more susceptible to fatal respiratory impairment.  Uncommon side effects: An increased sensitivity to feeling pain and extreme response to pain (hyperalgesia). Chronic use of opioids can lead to this. Delayed gastric emptying (the process by which the contents of your stomach are moved into your small intestine). Muscle rigidity. Immune system and hormonal dysfunction. Quick, involuntary muscle jerks (myoclonus). Arrhythmia. Itchy skin (pruritus). Dry mouth (xerostomia).  Long-term side effects: Chronic constipation. Sleep-disordered breathing (SDB). Increased risk of bone fractures. Hypothalamic-pituitary-adrenal dysregulation. Increased risk of overdose.  RISKS: Respiratory depression and death: Opioids increase the risk of respiratory depression and death.  Drug-to-drug interactions: Opioids are relatively contraindicated in combination with benzodiazepines, sleep inducers, and other central nervous system depressants. Other classes of medications  (i.e.: certain antibiotics and even over-the-counter medications) may also trigger or induce respiratory depression in some patients.  Medical conditions: Patients with pre-existing respiratory problems are at higher risk of respiratory failure and/or depression when in combination with opioid analgesics. Opioids are relatively contraindicated in some medical conditions such as central sleep apnea.   Fractures and Falls:  Opioids increase the risk and incidence of falls. This is of particular importance in elderly patients.  Endocrine System:  Long-term administration is associated with endocrine abnormalities (endocrinopathies). (Also known as Opioid-induced Endocrinopathy) Influences on both the hypothalamic-pituitary-adrenal axis?and the hypothalamic-pituitary-gonadal axis have been demonstrated with consequent hypogonadism and adrenal insufficiency in both sexes. Hypogonadism and decreased levels of dehydroepiandrosterone sulfate have been reported in men and women. Endocrine effects include: Amenorrhoea in women (abnormal absence of menstruation) Reduced libido in both sexes Decreased sexual function Erectile dysfunction in men Hypogonadisms (decreased testicular function with shrinkage of testicles) Infertility Depression and fatigue Loss of muscle mass Anxiety Depression Immune suppression Hyperalgesia Weight gain Anemia Osteoporosis Patients (particularly women of childbearing age) should avoid opioids. There is insufficient evidence to recommend routine monitoring of asymptomatic patients taking opioids in the long-term for hormonal deficiencies.  Immune System: Human studies have demonstrated that opioids have an immunomodulating effect. These effects are mediated via opioid receptors both on immune effector cells and in the central nervous system. Opioids have been demonstrated to have adverse effects on antimicrobial response and anti-tumour surveillance. Buprenorphine has  been demonstrated to have no impact on immune function.  Opioid Induced Hyperalgesia: Human studies have demonstrated that prolonged use of opioids can lead to a state of abnormal pain sensitivity, sometimes called opioid induced hyperalgesia (OIH). Opioid induced hyperalgesia is not usually seen in the absence of tolerance to opioid analgesia. Clinically, hyperalgesia may be diagnosed if the patient on long-term opioid therapy presents with increased pain. This might be qualitatively and anatomically distinct from pain related to disease progression or to breakthrough pain resulting from development of opioid tolerance. Pain associated with hyperalgesia tends to be more diffuse than the pre-existing pain and less defined in quality. Management of opioid induced hyperalgesia requires opioid dose reduction.  Cancer: Chronic opioid therapy has been associated with an increased risk of cancer  among noncancer patients with chronic pain. This association was more evident in chronic strong opioid users. Chronic opioid consumption causes significant pathological changes in the small intestine and colon. Epidemiological studies have found that there is a link between opium dependence and initiation of gastrointestinal cancers. Cancer is the second leading cause of death after cardiovascular disease. Chronic use of opioids can cause multiple conditions such as GERD, immunosuppression and renal damage as well as carcinogenic effects, which are associated with the incidence of cancers.   Mortality: Long-term opioid use has been associated with increased mortality among patients with chronic non-cancer pain (CNCP).  Prescription of long-acting opioids for chronic noncancer pain was associated with a significantly increased risk of all-cause mortality, including deaths from causes other than overdose.  Reference: Von Korff M, Kolodny A, Deyo RA, Chou R. Long-term opioid therapy reconsidered. Ann Intern Med. 2011  Sep 6;155(5):325-8. doi: 10.7326/[id number]. PMID: 64403474; PMCID: [id number]. Randon Goldsmith, Hayward RA, Dunn KM, [place] KP. Risk of adverse events in patients prescribed long-term opioids: A cohort study in the [place] [facility] Research Datalink. Eur J Pain. 2019 May;23(5):908-922. doi: 10.1002/ejp.1357. Epub 2019 Jan 31. PMID: 43329518. Colameco S, Coren JS, Ciervo CA. Continuous opioid treatment for chronic noncancer pain: a time for moderation in prescribing. Postgrad Med. 2009 Jul;121(4):61-6. doi: 10.3810/pgm.2009.07.2032. PMID: 84166063. William Hamburger RN, [place] SD, Blazina I, Cristopher Peru, Bougatsos C, Deyo RA. The effectiveness and risks of long-term opioid therapy for chronic pain: a systematic review for a [employer] of [employer] Pathways to [employer]. Ann Intern Med. 2015 Feb 17;162(4):276-86. doi: 10.7326/M14-2559. PMID: 01601093. Caryl Bis [facility], Makuc DM. NCHS Data Brief No. 22. [place]: Centers for Disease Control and Prevention; 2009. Sep, Increase in Fatal Poisonings Involving Opioid Analgesics in the [place], 1999-2006. Song IA, Choi HR, Oh TK. Long-term opioid use and mortality in patients with chronic non-cancer pain: Ten-year follow-up study in [place] from 2010 through 2019. EClinicalMedicine. 2022 Jul 18;51:101558. doi: 10.1016/j.eclinm.2022.235573. PMID: 22025427; PMCID: [id number]. Huser, W., Schubert, T., Vogelmann, T. et al. All-cause mortality in patients with long-term opioid therapy compared with non-opioid analgesics for chronic non-cancer pain: a database study. BMC Med 18, 162 (2020). http://lester.info/ Rashidian H, Karie Kirks, Malekzadeh R, Haghdoost AA. An Ecological Study of the Association between Opiate Use and Incidence of Cancers. Addict Health. 2016 Fall;8(4):252-260. PMID: 51761607; PMCID: [id number].  Our Goal: Our goal is to control your  pain with means other than the use of opioid pain medications.  Our Recommendation: Talk to your physician about coming off of these medications. We can assist you with the tapering down and stopping these medicines. Based on the new information, even if you cannot completely stop the medication, a decrease in the dose may be associated with a lesser risk. Ask for other means of controlling the pain. Decrease or eliminate those factors that significantly contribute to your pain such as smoking, obesity, and a diet heavily tilted towards "inflammatory" nutrients.  Last Updated: 05/27/2023   ____________________________________________________________________________________________     ____________________________________________________________________________________________  National Pain Medication Shortage  The [place] is experiencing worsening drug shortages. These have had a negative widespread effect on patient care and treatment. Not expected to improve any time soon. Predicted to last past 2029.   Drug shortage list (generic names) Oxycodone IR Oxycodone/APAP Oxymorphone IR Hydromorphone Hydrocodone/APAP Morphine  Where is the problem?  Manufacturing and supply level.  Will this shortage affect you?  Only if you  take any of the above pain medications.  How? You may be unable to fill your prescription.  Your pharmacist may offer a "partial fill" of your prescription. (Warning: Do not accept partial fills.) Prescriptions partially filled cannot be transferred to another pharmacy. Read our Medication Rules and Regulation. Depending on how much medicine you are dependent on, you may experience withdrawals when unable to get the medication.  Recommendations: Consider ending your dependence on opioid pain medications. Ask your pain specialist to assist you with the process. Consider switching to a medication currently not in shortage, such as Buprenorphine. Talk to your pain  specialist about this option. Consider decreasing your pain medication requirements by managing tolerance thru "Drug Holidays". This may help minimize withdrawals, should you run out of medicine. Control your pain thru the use of non-pharmacological interventional therapies.   Your prescriber: Prescribers cannot be blamed for shortages. Medication manufacturing and supply issues cannot be fixed by the prescriber.   NOTE: The prescriber is not responsible for supplying the medication, or solving supply issues. Work with your pharmacist to solve it. The patient is responsible for the decision to take or continue taking the medication and for identifying and securing a legal supply source. By law, supplying the medication is the job and responsibility of the pharmacy. The prescriber is responsible for the evaluation, monitoring, and prescribing of these medications.   Prescribers will NOT: Re-issue prescriptions that have been partially filled. Re-issue prescriptions already sent to a pharmacy.  Re-send prescriptions to a different pharmacy because yours did not have your medication. Ask pharmacist to order more medicine or transfer the prescription to another pharmacy. (Read below.)  New 2023 regulation: "July 20, 2022 Revised Regulation Allows DEA-Registered Pharmacies to Transfer Electronic Prescriptions at a Patient's Request DEA Headquarters Division - Public Information Office Patients now have the ability to request their electronic prescription be transferred to another pharmacy without having to go back to their practitioner to initiate the request. This revised regulation went into effect on Monday, July 16, 2022.     At a patient's request, a DEA-registered retail pharmacy can now transfer an electronic prescription for a controlled substance (schedules II-V) to another DEA-registered retail pharmacy. Prior to this change, patients would have to go through their practitioner to  cancel their prescription and have it re-issued to a different pharmacy. The process was taxing and time consuming for both patients and practitioners.    The Drug Enforcement Administration [facility]) published its intent to revise the process for transferring electronic prescriptions on October 07, 2020.  The final rule was published in the federal register on June 14, 2022 and went into effect 30 days later.  Under the final rule, a prescription can only be transferred once between pharmacies, and only if allowed under existing state or other applicable law. The prescription must remain in its electronic form; may not be altered in any way; and the transfer must be communicated directly between two licensed pharmacists. It's important to note, any authorized refills transfer with the original prescription, which means the entire prescription will be filled at the same pharmacy".  Reference: [email or web address].is [facility] website announcement)  [email or web address] [employer] of Justice)   [employer] / Vol. 88, No. 143 / Thursday, June 14, 2022 / Rules and Regulations DEPARTMENT OF JUSTICE  Drug Enforcement Administration  21 CFR Part 1306  [Docket No. [id number]]  RIN [id number] Transfer of Electronic Prescriptions for Schedules II-V Controlled Substances Between Pharmacies for Initial Filling  ____________________________________________________________________________________________  ____________________________________________________________________________________________  Transfer of Pain Medication between Pharmacies  Re: 2023 DEA Clarification on existing regulation  Published on DEA Website: July 20, 2022  Title: Revised Regulation Allows DEA-Registered Pharmacies to Electrical engineer Prescriptions at a Patient's  Request DEA Headquarters Division - [employer]  "Patients now have the ability to request their electronic prescription be transferred to another pharmacy without having to go back to their practitioner to initiate the request. This revised regulation went into effect on Monday, July 16, 2022.     At a patient's request, a DEA-registered retail pharmacy can now transfer an electronic prescription for a controlled substance (schedules II-V) to another DEA-registered retail pharmacy. Prior to this change, patients would have to go through their practitioner to cancel their prescription and have it re-issued to a different pharmacy. The process was taxing and time consuming for both patients and practitioners.    The Drug Enforcement Administration [facility]) published its intent to revise the process for transferring electronic prescriptions on October 07, 2020.  The final rule was published in the federal register on June 14, 2022 and went into effect 30 days later.  Under the final rule, a prescription can only be transferred once between pharmacies, and only if allowed under existing state or other applicable law. The prescription must remain in its electronic form; may not be altered in any way; and the transfer must be communicated directly between two licensed pharmacists. It's important to note, any authorized refills transfer with the original prescription, which means the entire prescription will be filled at the same pharmacy."    REFERENCES: 1. DEA website announcement [email or web address]  2. Department of Justice website  [email or web address]  3. DEPARTMENT OF JUSTICE Drug Enforcement Administration 21 CFR Part 1306 [Docket No. [id number]] RIN [id number] "Transfer of Electronic Prescriptions for Schedules II-V Controlled Substances  Between Pharmacies for Initial Filling"  ____________________________________________________________________________________________     _______________________________________________________________________  Medication Rules  Purpose: To inform patients, and their family members, of our medication rules and regulations.  Applies to: All patients receiving prescriptions from our practice (written or electronic).  Pharmacy of record: This is the pharmacy where your electronic prescriptions will be sent. Make sure we have the correct one.  Electronic prescriptions: In compliance with the [facility] Strengthen Opioid Misuse Prevention (STOP) Act of 2017 (Session Conni Elliot [phone]), effective November 19, 2018, all controlled substances must be electronically prescribed. Written prescriptions, faxing, or calling prescriptions to a pharmacy will no longer be done.  Prescription refills: These will be provided only during in-person appointments. No medications will be renewed without a "face-to-face" evaluation with your provider. Applies to all prescriptions.  NOTE: The following applies primarily to controlled substances (Opioid* Pain Medications).   Type of encounter (visit): For patients receiving controlled substances, face-to-face visits are required. (Not an option and not up to the patient.)  Patient's responsibilities: Pain Pills: Bring all pain pills to every appointment (except for procedure appointments). Pill Bottles: Bring pills in original pharmacy bottle. Bring bottle, even if empty. Always bring the bottle of the most recent fill.  Medication refills: You are responsible for knowing and keeping track of what medications you are taking and when is it that you will need a refill. The day before your appointment: write a list of all prescriptions that need to be refilled. The day of the appointment: give the list to the admitting nurse. Prescriptions will be written only  during appointments. No prescriptions will be written on procedure days. If you forget a  medication: it will not be "Called in", "Faxed", or "electronically sent". You will need to get another appointment to get these prescribed. No early refills. Do not call asking to have your prescription filled early. Partial  or short prescriptions: Occasionally your pharmacy may not have enough pills to fill your prescription.  NEVER ACCEPT a partial fill or a prescription that is short of the total amount of pills that you were prescribed.  With controlled substances the law allows 72 hours for the pharmacy to complete the prescription.  If the prescription is not completed within 72 hours, the pharmacist will require a new prescription to be written. This means that you will be short on your medicine and we WILL NOT send another prescription to complete your original prescription.  Instead, request the pharmacy to send a carrier to a nearby branch to get enough medication to provide you with your full prescription. Prescription Accuracy: You are responsible for carefully inspecting your prescriptions before leaving our office. Have the discharge nurse carefully go over each prescription with you, before taking them home. Make sure that your name is accurately spelled, that your address is correct. Check the name and dose of your medication to make sure it is accurate. Check the number of pills, and the written instructions to make sure they are clear and accurate. Make sure that you are given enough medication to last until your next medication refill appointment. Taking Medication: Take medication as prescribed. When it comes to controlled substances, taking less pills or less frequently than prescribed is permitted and encouraged. Never take more pills than instructed. Never take the medication more frequently than prescribed.  Inform other Doctors: Always inform, all of your healthcare providers, of all the  medications you take. Pain Medication from other Providers: You are not allowed to accept any additional pain medication from any other Doctor or Healthcare provider. There are two exceptions to this rule. (see below) In the event that you require additional pain medication, you are responsible for notifying us, as stated below. Cough Medicine: Often these contain an opioid, such as codeine or hydrocodone. Never accept or take cough medicine containing these opioids if you are already taking an opioid* medication. The combination may cause respiratory failure and death. Medication Agreement: You are responsible for carefully reading and following our Medication Agreement. This must be signed before receiving any prescriptions from our practice. Safely store a copy of your signed Agreement. Violations to the Agreement will result in no further prescriptions. (Additional copies of our Medication Agreement are available upon request.) Laws, Rules, & Regulations: All patients are expected to follow all [address] and [employer], [employer], Rules, [place] [employer]. Ignorance of the Laws does not constitute a valid excuse.  Illegal drugs and Controlled Substances: The use of illegal substances (including, but not limited to marijuana and its derivatives) and/or the illegal use of any controlled substances is strictly prohibited. Violation of this rule may result in the immediate and permanent discontinuation of any and all prescriptions being written by our practice. The use of any illegal substances is prohibited. Adopted CDC guidelines & recommendations: Target dosing levels will be at or below 60 MME/day. Use of benzodiazepines** is not recommended.  Exceptions: There are only two exceptions to the rule of not receiving pain medications from other Healthcare Providers. Exception #1 (Emergencies): In the event of an emergency (i.e.: accident requiring emergency care), you are allowed to receive additional pain  medication. However, you are responsible for: As soon as  you are able, call our office [phone], at any time of the day or night, and leave a message stating your name, the date and nature of the emergency, and the name and dose of the medication prescribed. In the event that your call is answered by a member of our staff, make sure to document and save the date, time, and the name of the person that took your information.  Exception #2 (Planned Surgery): In the event that you are scheduled by another doctor or dentist to have any type of surgery or procedure, you are allowed (for a period no longer than 30 days), to receive additional pain medication, for the acute post-op pain. However, in this case, you are responsible for picking up a copy of our "Post-op Pain Management for Surgeons" handout, and giving it to your surgeon or dentist. This document is available at our office, and does not require an appointment to obtain it. Simply go to our office during business hours (Monday-Thursday from 8:00 AM to 4:00 PM) (Friday 8:00 AM to 12:00 Noon) or if you have a scheduled appointment with [place], prior to your surgery, and ask for it by name. In addition, you are responsible for: calling our office [phone], at any time of the day or night, and leaving a message stating your name, name of your surgeon, type of surgery, and date of procedure or surgery. Failure to comply with your responsibilities may result in termination of therapy involving the controlled substances. Medication Agreement Violation. Following the above rules, including your responsibilities will help you in avoiding a Medication Agreement Violation ("Breaking your Pain Medication Contract").  Consequences:  Not following the above rules may result in permanent discontinuation of medication prescription therapy.  *Opioid medications include: morphine, codeine, oxycodone, oxymorphone, hydrocodone, hydromorphone, meperidine, tramadol,  tapentadol, buprenorphine, fentanyl, methadone. **Benzodiazepine medications include: diazepam (Valium), alprazolam (Xanax), clonazepam (Klonopine), lorazepam (Ativan), clorazepate (Tranxene), chlordiazepoxide (Librium), estazolam (Prosom), oxazepam (Serax), temazepam (Restoril), triazolam (Halcion) (Last updated: 09/11/2022) ______________________________________________________________________    ______________________________________________________________________  Medication Recommendations and Reminders  Applies to: All patients receiving prescriptions (written and/or electronic).  Medication Rules & Regulations: You are responsible for reading, knowing, and following our "Medication Rules" document. These exist for your safety and that of others. They are not flexible and neither are we. Dismissing or ignoring them is an act of "non-compliance" that may result in complete and irreversible termination of such medication therapy. For safety reasons, "non-compliance" will not be tolerated. As with the [place] fundamental legal principle of "ignorance of the law is no defense", we will accept no excuses for not having read and knowing the content of documents provided to you by our practice.  Pharmacy of record:  Definition: This is the pharmacy where your electronic prescriptions will be sent.  We do not endorse any particular pharmacy. It is up to you and your insurance to decide what pharmacy to use.  We do not restrict you in your choice of pharmacy. However, once we write for your prescriptions, we will NOT be re-sending more prescriptions to fix restricted supply problems created by your pharmacy, or your insurance.  The pharmacy listed in the electronic medical record should be the one where you want electronic prescriptions to be sent. If you choose to change pharmacy, simply notify our nursing staff. Changes will be made only during your regular appointments and not over the  phone.  Recommendations: Keep all of your pain medications in a safe place, under lock and key, even  if you live alone. We will NOT replace lost, stolen, or damaged medication. We do not accept "Police Reports" as proof of medications having been stolen. After you fill your prescription, take 1 week's worth of pills and put them away in a safe place. You should keep a separate, properly labeled bottle for this purpose. The remainder should be kept in the original bottle. Use this as your primary supply, until it runs out. Once it's gone, then you know that you have 1 week's worth of medicine, and it is time to come in for a prescription refill. If you do this correctly, it is unlikely that you will ever run out of medicine. To make sure that the above recommendation works, it is very important that you make sure your medication refill appointments are scheduled at least 1 week before you run out of medicine. To do this in an effective manner, make sure that you do not leave the office without scheduling your next medication management appointment. Always ask the nursing staff to show you in your prescription , when your medication will be running out. Then arrange for the receptionist to get you a return appointment, at least 7 days before you run out of medicine. Do not wait until you have 1 or 2 pills left, to come in. This is very poor planning and does not take into consideration that we may need to cancel appointments due to bad weather, sickness, or emergencies affecting our staff. DO NOT ACCEPT A "Partial Fill": If for any reason your pharmacy does not have enough pills/tablets to completely fill or refill your prescription, do not allow for a "partial fill". The law allows the pharmacy to complete that prescription within 72 hours, without requiring a new prescription. If they do not fill the rest of your prescription within those 72 hours, you will need a separate prescription to fill the remaining  amount, which we will NOT provide. If the reason for the partial fill is your insurance, you will need to talk to the pharmacist about payment alternatives for the remaining tablets, but again, DO NOT ACCEPT A PARTIAL FILL, unless you can trust your pharmacist to obtain the remainder of the pills within 72 hours.  Prescription refills and/or changes in medication(s):  Prescription refills, and/or changes in dose or medication, will be conducted only during scheduled medication management appointments. (Applies to both, written and electronic prescriptions.) No refills on procedure days. No medication will be changed or started on procedure days. No changes, adjustments, and/or refills will be conducted on a procedure day. Doing so will interfere with the diagnostic portion of the procedure. No phone refills. No medications will be "called into the pharmacy". No Fax refills. No weekend refills. No Holliday refills. No after hours refills.  Remember:  Business hours are:  Monday to Thursday 8:00 AM to 4:00 PM Provider's Schedule: Delano Metz, MD - Appointments are:  Medication management: Monday and Wednesday 8:00 AM to 4:00 PM Procedure day: Tuesday and Thursday 7:30 AM to 4:00 PM Edward Jolly, MD - Appointments are:  Medication management: Tuesday and Thursday 8:00 AM to 4:00 PM Procedure day: Monday and Wednesday 7:30 AM to 4:00 PM (Last update: 09/11/2022) ______________________________________________________________________   ____________________________________________________________________________________________  Naloxone Nasal Spray  Why am I receiving this medication? [place] STOP ACT requires that all patients taking high dose opioids or at risk of opioids respiratory depression, be prescribed an opioid reversal agent, such as Naloxone (AKA: Narcan).  What is this medication? NALOXONE (  nal OX one) treats opioid overdose, which causes slow or shallow breathing,  severe drowsiness, or trouble staying awake. Call emergency services after using this medication. You may need additional treatment. Naloxone works by reversing the effects of opioids. It belongs to a group of medications called opioid blockers.  COMMON BRAND NAME(S): Kloxxado, Narcan  What should I tell my care team before I take this medication? They need to know if you have any of these conditions: Heart disease Substance use disorder An unusual or allergic reaction to naloxone, other medications, foods, dyes, or preservatives Pregnant or trying to get pregnant Breast-feeding  When to use this medication? This medication is to be used for the treatment of respiratory depression (less than 8 breaths per minute) secondary to opioid overdose.   How to use this medication? This medication is for use in the nose. Lay the person on their back. Support their neck with your hand and allow the head to tilt back before giving the medication. The nasal spray should be given into 1 nostril. After giving the medication, move the person onto their side. Do not remove or test the nasal spray until ready to use. Get emergency medical help right away after giving the first dose of this medication, even if the person wakes up. You should be familiar with how to recognize the signs and symptoms of a narcotic overdose. If more doses are needed, give the additional dose in the other nostril. Talk to your care team about the use of this medication in children. While this medication may be prescribed for children as young as newborns for selected conditions, precautions do apply.  Naloxone Overdosage: If you think you have taken too much of this medicine contact a poison control center or emergency room at once.  NOTE: This medicine is only for you. Do not share this medicine with others.  What if I miss a dose? This does not apply.  What may interact with this medication? This is only used during an  emergency. No interactions are expected during emergency use. This list may not describe all possible interactions. Give your health care provider a list of all the medicines, herbs, non-prescription drugs, or dietary supplements you use. Also tell them if you smoke, drink alcohol, or use illegal drugs. Some items may interact with your medicine.  What should I watch for while using this medication? Keep this medication ready for use in the case of an opioid overdose. Make sure that you have the phone number of your care team and local hospital ready. You may need to have additional doses of this medication. Each nasal spray contains a single dose. Some emergencies may require additional doses. After use, bring the treated person to the nearest hospital or call 911. Make sure the treating care team knows that the person has received a dose of this medication. You will receive additional instructions on what to do during and after use of this medication before an emergency occurs.  What side effects may I notice from receiving this medication? Side effects that you should report to your care team as soon as possible: Allergic reactions--skin rash, itching, hives, swelling of the face, lips, tongue, or throat Side effects that usually do not require medical attention (report these to your care team if they continue or are bothersome): Constipation Dryness or irritation inside the nose Headache Increase in blood pressure Muscle spasms Stuffy nose Toothache This list may not describe all possible side effects. Call your doctor for  medical advice about side effects. You may report side effects to FDA at 1-800-FDA-1088.  Where should I keep my medication? Because this is an emergency medication, you should keep it with you at all times.  Keep out of the reach of children and pets. Store between 20 and 25 degrees C (68 and 77 degrees F). Do not freeze. Throw away any unused medication after the  expiration date. Keep in original box until ready to use.  NOTE: This sheet is a summary. It may not cover all possible information. If you have questions about this medicine, talk to your doctor, pharmacist, or health care provider.   2023 [employer]/Gold Standard (2021-07-14 00:00:00)  ____________________________________________________________________________________________

## 2023-07-03 ENCOUNTER — Ambulatory Visit (HOSPITAL_BASED_OUTPATIENT_CLINIC_OR_DEPARTMENT_OTHER): Payer: Medicare PPO | Admitting: Pain Medicine

## 2023-07-03 DIAGNOSIS — Z79899 Other long term (current) drug therapy: Secondary | ICD-10-CM

## 2023-07-03 DIAGNOSIS — Z91199 Patient's noncompliance with other medical treatment and regimen due to unspecified reason: Secondary | ICD-10-CM

## 2023-07-08 NOTE — Progress Notes (Unsigned)
PROVIDER NOTE: Information contained herein reflects review and annotations entered in association with encounter. Interpretation of such information and data should be left to medically-trained personnel. Information provided to patient can be located elsewhere in the medical record under "Patient Instructions". Document created using STT-dictation technology, any transcriptional errors that may result from process are unintentional.    Patient: Mariah Poole  Service Category: E/M  Provider: Oswaldo Done, MD  DOB: April 21, 1946  DOS: 07/10/2023  Referring Provider: Leanna Sato, MD  MRN: [id number]  Specialty: Interventional Pain Management  PCP: Leanna Sato, MD  Type: Established Patient  Setting: Ambulatory outpatient    Location: Office  Delivery: Face-to-face     HPI  Mariah Poole, a 77 y.o. year old female, is here today because of her Chronic pain syndrome [G89.4]. Mariah Poole primary complain today is No chief complaint on file.  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, L4-5, L5-S1) w/ neurogenic claudication; Lumbar foraminal stenosis (Multilevel) (Bilateral); Cervicalgia; DDD (degenerative disc disease), cervical; Cervical facet hypertrophy; Cervical facet joint syndrome; Lumbar facet hypertrophy; 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 Pain Assessment: Severity of   is reported as a  /10. Location:    / . Onset:  . Quality:  . Timing:  . Modifying factor(s):  Marland Kitchen Vitals:  vitals were not taken for this visit.  BMI: Estimated body mass index is 36.13 kg/m as calculated from the following:   Height as of 03/27/23: 5' (1.524 m).   Weight as of 03/27/23: 185 lb (83.9 kg). Last encounter: 07/03/2023. Last procedure: Visit date not found.  Reason for encounter: medication management. ***  Routine UDS ordered today.   RTCB: 10/08/2023   Pharmacotherapy Assessment  Analgesic: Hydrocodone/APAP 10/325, 1 tab PO q 8 hrs PRN (30 mg/day of hydrocodone) (975 mg/day of acetaminophen) MME/day: 30 mg/day.   Monitoring:  PMP: PDMP reviewed during this encounter.       Pharmacotherapy: No side-effects or adverse reactions reported. Compliance: No problems identified. Effectiveness: Clinically acceptable.  No notes on file  No results found for: "CBDTHCR" No results found for: "D8THCCBX" No results found for: "D9THCCBX"  UDS:  Summary  Date Value Ref Range Status  07/04/2022 Note  Final    Comment:    ==================================================================== [facility] (MW) ==================================================================== Test                             Result       Flag       Units  Drug Present and Declared for Prescription Verification   Hydrocodone                    2513         EXPECTED  ng/mg creat   Hydromorphone                  290          EXPECTED   ng/mg creat   Dihydrocodeine                 711          EXPECTED   ng/mg creat   Norhydrocodone                 >5000        EXPECTED   ng/mg creat    Sources of  hydrocodone include scheduled prescription medications.    Hydromorphone, dihydrocodeine and norhydrocodone are expected    metabolites of hydrocodone. Hydromorphone and dihydrocodeine are    also available as scheduled prescription medications.  ==================================================================== Test                      Result    Flag   Units      Ref Range   Creatinine              100              mg/dL      >=40 ==================================================================== Declared Medications:  The flagging and interpretation on this report are based on the  following declared medications.  Unexpected results may arise from  inaccuracies in the declared medications.   **Note: The testing scope of this panel includes these medications:   Hydrocodone (Norco)   **Note: The testing scope of this panel does not include the  following reported medications:   Acetaminophen (Norco)  Albuterol (Ventolin HFA)  Atenolol (Tenormin)  Budesonide (Symbicort)  Duloxetine (Cymbalta)  Famotidine (Pepcid)  Formoterol (Symbicort)  Furosemide (Lasix)  Iron  Losartan (Cozaar)  Pantoprazole (Protonix)  Potassium (Klor-Con)  Pravastatin (Pravachol)  Spironolactone (Aldactone)  Turmeric  Vitamin C  Vitamin D3 ==================================================================== For clinical consultation, please call [phone]. ====================================================================       ROS  Constitutional: Denies any fever or chills Gastrointestinal: No reported hemesis, hematochezia, vomiting, or acute GI distress Musculoskeletal: Denies any acute onset joint swelling, redness, loss of ROM, or weakness Neurological: No reported episodes of acute onset apraxia, aphasia, dysarthria, agnosia, amnesia, paralysis, loss of coordination, or loss of consciousness  Medication Review  DULoxetine, HYDROcodone-acetaminophen, Lidocaine,  Semaglutide(0.25 or 0.5MG /DOS), Turmeric Curcumin, Vitamin D3, albuterol, ascorbic acid, atenolol, budesonide-formoterol, famotidine, ferrous sulfate, furosemide, losartan, magnesium oxide, meloxicam, montelukast, naloxone, pantoprazole, potassium chloride SA, pravastatin, and spironolactone  History Review  Allergy: Mariah Poole is allergic to contrast media [iodinated contrast media], ioxaglate, and shellfish allergy. Drug: Mariah Poole  reports current drug use. Alcohol:  reports no history of alcohol use. Tobacco:  reports that she has never smoked. She has never used smokeless tobacco. Social: Mariah Poole  reports that she has never smoked. She has never used smokeless tobacco. She reports current drug use. She reports that she does not drink alcohol. Medical:  has a past medical history of Anemia, Arthritis, COPD (chronic obstructive pulmonary disease) (HCC), Depression, Diabetes mellitus without complication (HCC), GERD (gastroesophageal reflux disease), Hiatal hernia, History of contrast media allergy. (IVP dye) (09/01/2015), History of hiatal hernia, Hypercholesteremia, Hypertension, Morbid obesity (HCC), Scoliosis, and Shortness of breath dyspnea. Surgical: Mariah Poole  has a past surgical history that includes Abdominal hysterectomy; Cataract extraction w/ intraocular lens implant (Right); Gastric bypass; Hernia repair; Cataract extraction w/PHACO (Left, 07/19/2015); Cholecystectomy; and Hernia repair. Family: family  history includes Diabetes in her mother; Heart disease in her father.  Laboratory Chemistry Profile   Renal Lab Results  Component Value Date   BUN 14 09/14/2021   CREATININE 1.02 (H) 09/14/2021   BCR 21 06/17/2019   GFRAA 53 (L) 06/17/2019   GFRNONAA 57 (L) 09/14/2021    Hepatic Lab Results  Component Value Date   AST 19 09/14/2021   ALT 11 09/14/2021   ALBUMIN 3.8 09/14/2021   ALKPHOS 66 09/14/2021   LIPASE 19 10/27/2015    Electrolytes Lab Results  Component  Value Date   NA 140 09/14/2021   K 4.3 09/14/2021   CL 102 09/14/2021   CALCIUM 9.3 09/14/2021   MG 1.9 06/17/2019    Bone Lab Results  Component Value Date   25OHVITD1 45 06/17/2019   25OHVITD2 <1.0 06/17/2019   25OHVITD3 45 06/17/2019    Inflammation (CRP: Acute Phase) (ESR: Chronic Phase) Lab Results  Component Value Date   CRP 7 06/17/2019   ESRSEDRATE 49 (H) 06/17/2019   LATICACIDVEN 4.0 (H) 11/23/2009         Note: Above Lab results reviewed.  Recent Imaging Review  DG Lumbar Spine Complete W/Bend CLINICAL DATA:  Degenerative disc disease  Chronic bilateral low back pain without sciatica  EXAM: LUMBAR SPINE - COMPLETE WITH BENDING VIEWS  COMPARISON:  01/06/2013  FINDINGS: Multiple surgical clips seen in the left upper quadrant. IVC filter seen in the right paraspinal region. Fractured leg of the IVC filter is seen more superiorly at the L2-L3 level.  Grade 1 anterolisthesis of L5 on S1 which does not significantly change with flexion or extension. There is development of grade 1 anterolisthesis of L3 on L4 and L4 on L5 with flexion as well as extension, which improves with neutral positioning. Severe disc height loss and endplate degenerative changes again seen at L4-L5 and L5-S1. Advanced disc height loss and endplate degenerative changes seen throughout the visualized lower thoracic spine. Moderate facet degenerative changes seen throughout the lumbar spine, greatest at L5-S1.  IMPRESSION: 1. Multilevel degenerative changes of the lumbar spine, greatest at L5-S1. 2. IVC filter seen in the paraspinal region at L3-L4, now with a fractured limb located more superiorly at L2-L3. Interventional radiology consultation should be considered for filter extraction.  Electronically Signed   By: Acquanetta Belling M.D.   On: 04/21/2023 08:42 Note: Reviewed        Physical Exam  General appearance: Well nourished, well developed, and well hydrated. In no apparent  acute distress Mental status: Alert, oriented x 3 (person, place, & time)       Respiratory: No evidence of acute respiratory distress Eyes: PERLA Vitals: There were no vitals taken for this visit. BMI: Estimated body mass index is 36.13 kg/m as calculated from the following:   Height as of 03/27/23: 5' (1.524 m).   Weight as of 03/27/23: 185 lb (83.9 kg). Ideal: Patient weight not recorded  Assessment   Diagnosis Status  1. Chronic pain syndrome   2. Chronic low back pain (1ry area of Pain) (Bilateral) (R>L) w/o sciatica   3. Cervicalgia   4. DDD (degenerative disc disease), lumbar   5. 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 Cervical facet joint syndrome   7. Chronic hip pain (Right)   8. Lumbar facet syndrome (Bilateral) (R>L)   9. Rheumatoid arthritis involving multiple sites with positive rheumatoid factor (HCC)   10. Chronic musculoskeletal pain   11. Pharmacologic therapy  12. Chronic use of opiate for therapeutic purpose   13. Encounter for medication management   14. Encounter for chronic pain management    Controlled Controlled Controlled   Updated Problems: No problems updated.  Plan of Care  Problem-specific:  No problem-specific Assessment & Plan notes found for this encounter.  Mariah Poole has a current medication list which includes the following long-term medication(s): albuterol, atenolol, duloxetine, famotidine, ferosul, furosemide, hydrocodone-acetaminophen, montelukast, pantoprazole, potassium chloride sa, pravastatin, spironolactone, and symbicort.  Pharmacotherapy (Medications Ordered): No orders of the defined types were placed in this encounter.  Orders:  No orders of the defined types were placed in this encounter.  Follow-up plan:   No follow-ups on file.      Interventional Therapies  Risk  Complexity Considerations:   Allergy: CONTRAST  COPD  BA  CKD  SOBOE  GERD  Jehovah's witness   Hx. intestinal obstruction  CHF  HTN  DM  MO (BMI>30)  No RFA until patient brings BMI down to less than 30 kg/m   Planned  Pending:   Therapeutic bilateral lumbar facet MBB #4    Under consideration:   Possible bilateral lumbar facet RFA #1 (on hold until BMI is brought down to 30 kg/m)   Completed:   Diagnostic bilateral lumbar facet MBB x3 (01/18/2022) (5/10 to 0/10) (100/100/75/75-100)    Therapeutic  Palliative (PRN) options:   Therapeutic/palliative bilateral lumbar facet MBB   Pharmacotherapy  Nonopioids transferred 11/09/2020: Magnesium, Lyrica, and Mobic.         Recent Visits No visits were found meeting these conditions. Showing recent visits within past 90 days and meeting all other requirements Future Appointments Date Type Provider Dept  07/10/23 Appointment Delano Metz, MD [facility]  Showing future appointments within next 90 days and meeting all other requirements  I discussed the assessment and treatment plan with the patient. The patient was provided an opportunity to ask questions and all were answered. The patient agreed with the plan and demonstrated an understanding of the instructions.  Patient advised to call back or seek an in-person evaluation if the symptoms or condition worsens.  Duration of encounter: *** minutes.  Total time on encounter, as per AMA guidelines included both the face-to-face and non-face-to-face time personally spent by the physician and/or other qualified health care professional(s) on the day of the encounter (includes time in activities that require the physician or other qualified health care professional and does not include time in activities normally performed by clinical staff). Physician's time may include the following activities when performed: Preparing to see the patient (e.g., pre-charting review of records, searching for previously ordered imaging, lab work, and nerve conduction tests) Review  of prior analgesic pharmacotherapies. Reviewing PMP Interpreting ordered tests (e.g., lab work, imaging, nerve conduction tests) Performing post-procedure evaluations, including interpretation of diagnostic procedures Obtaining and/or reviewing separately obtained history Performing a medically appropriate examination and/or evaluation Counseling and educating the patient/family/caregiver Ordering medications, tests, or procedures Referring and communicating with other health care professionals (when not separately reported) Documenting clinical information in the electronic or other health record Independently interpreting results (not separately reported) and communicating results to the patient/ family/caregiver Care coordination (not separately reported)  Note by: Oswaldo Done, MD Date: 07/10/2023; Time: 6:36 AM

## 2023-07-08 NOTE — Patient Instructions (Signed)
 ____________________________________________________________________________________________  Opioid Pain Medication Update  To: All patients taking opioid pain medications. (I.e.: hydrocodone, hydromorphone, oxycodone, oxymorphone, morphine, codeine, methadone, tapentadol, tramadol, buprenorphine, fentanyl, etc.)  Re: Updated review of side effects and adverse reactions of opioid analgesics, as well as new information about long term effects of this class of medications.  Direct risks of long-term opioid therapy are not limited to opioid addiction and overdose. Potential medical risks include serious fractures, breathing problems during sleep, hyperalgesia, immunosuppression, chronic constipation, bowel obstruction, myocardial infarction, and tooth decay secondary to xerostomia.  Unpredictable adverse effects that can occur even if you take your medication correctly: Cognitive impairment, respiratory depression, and death. Most people think that if they take their medication "correctly", and "as instructed", that they will be safe. Nothing could be farther from the truth. In reality, a significant amount of recorded deaths associated with the use of opioids has occurred in individuals that had taken the medication for a long time, and were taking their medication correctly. The following are examples of how this can happen: Patient taking his/her medication for a long time, as instructed, without any side effects, is given a certain antibiotic or another unrelated medication, which in turn triggers a "Drug-to-drug interaction" leading to disorientation, cognitive impairment, impaired reflexes, respiratory depression or an untoward event leading to serious bodily harm or injury, including death.  Patient taking his/her medication for a long time, as instructed, without any side effects, develops an acute impairment of liver and/or kidney function. This will lead to a rapid inability of the body to  breakdown and eliminate their pain medication, which will result in effects similar to an "overdose", but with the same medicine and dose that they had always taken. This again may lead to disorientation, cognitive impairment, impaired reflexes, respiratory depression or an untoward event leading to serious bodily harm or injury, including death.  A similar problem will occur with patients as they grow older and their liver and kidney function begins to decrease as part of the aging process.  Background information: Historically, the original case for using long-term opioid therapy to treat chronic noncancer pain was based on safety assumptions that subsequent experience has called into question. In 1996, the American Pain Society and the American Academy of Pain Medicine issued a consensus statement supporting long-term opioid therapy. This statement acknowledged the dangers of opioid prescribing but concluded that the risk for addiction was low; respiratory depression induced by opioids was short-lived, occurred mainly in opioid-naive patients, and was antagonized by pain; tolerance was not a common problem; and efforts to control diversion should not constrain opioid prescribing. This has now proven to be wrong. Experience regarding the risks for opioid addiction, misuse, and overdose in community practice has failed to support these assumptions.  According to the Centers for Disease Control and Prevention, fatal overdoses involving opioid analgesics have increased sharply over the past decade. Currently, more than 96,700 people die from drug overdoses every year. Opioids are a factor in 7 out of every 10 overdose deaths. Deaths from drug overdose have surpassed motor vehicle accidents as the leading cause of death for individuals between the ages of 80 and 61.  Clinical data suggest that neuroendocrine dysfunction may be very common in both men and women, potentially causing hypogonadism, erectile  dysfunction, infertility, decreased libido, osteoporosis, and depression. Recent studies linked higher opioid dose to increased opioid-related mortality. Controlled observational studies reported that long-term opioid therapy may be associated with increased risk for cardiovascular events. Subsequent meta-analysis concluded  that the safety of long-term opioid therapy in elderly patients has not been proven.   Side Effects and adverse reactions: Common side effects: Drowsiness (sedation). Dizziness. Nausea and vomiting. Constipation. Physical dependence -- Dependence often manifests with withdrawal symptoms when opioids are discontinued or decreased. Tolerance -- As you take repeated doses of opioids, you require increased medication to experience the same effect of pain relief. Respiratory depression -- This can occur in healthy people, especially with higher doses. However, people with COPD, asthma or other lung conditions may be even more susceptible to fatal respiratory impairment.  Uncommon side effects: An increased sensitivity to feeling pain and extreme response to pain (hyperalgesia). Chronic use of opioids can lead to this. Delayed gastric emptying (the process by which the contents of your stomach are moved into your small intestine). Muscle rigidity. Immune system and hormonal dysfunction. Quick, involuntary muscle jerks (myoclonus). Arrhythmia. Itchy skin (pruritus). Dry mouth (xerostomia).  Long-term side effects: Chronic constipation. Sleep-disordered breathing (SDB). Increased risk of bone fractures. Hypothalamic-pituitary-adrenal dysregulation. Increased risk of overdose.  RISKS: Respiratory depression and death: Opioids increase the risk of respiratory depression and death.  Drug-to-drug interactions: Opioids are relatively contraindicated in combination with benzodiazepines, sleep inducers, and other central nervous system depressants. Other classes of medications  (i.e.: certain antibiotics and even over-the-counter medications) may also trigger or induce respiratory depression in some patients.  Medical conditions: Patients with pre-existing respiratory problems are at higher risk of respiratory failure and/or depression when in combination with opioid analgesics. Opioids are relatively contraindicated in some medical conditions such as central sleep apnea.   Fractures and Falls:  Opioids increase the risk and incidence of falls. This is of particular importance in elderly patients.  Endocrine System:  Long-term administration is associated with endocrine abnormalities (endocrinopathies). (Also known as Opioid-induced Endocrinopathy) Influences on both the hypothalamic-pituitary-adrenal axis?and the hypothalamic-pituitary-gonadal axis have been demonstrated with consequent hypogonadism and adrenal insufficiency in both sexes. Hypogonadism and decreased levels of dehydroepiandrosterone sulfate have been reported in men and women. Endocrine effects include: Amenorrhoea in women (abnormal absence of menstruation) Reduced libido in both sexes Decreased sexual function Erectile dysfunction in men Hypogonadisms (decreased testicular function with shrinkage of testicles) Infertility Depression and fatigue Loss of muscle mass Anxiety Depression Immune suppression Hyperalgesia Weight gain Anemia Osteoporosis Patients (particularly women of childbearing age) should avoid opioids. There is insufficient evidence to recommend routine monitoring of asymptomatic patients taking opioids in the long-term for hormonal deficiencies.  Immune System: Human studies have demonstrated that opioids have an immunomodulating effect. These effects are mediated via opioid receptors both on immune effector cells and in the central nervous system. Opioids have been demonstrated to have adverse effects on antimicrobial response and anti-tumour surveillance. Buprenorphine has  been demonstrated to have no impact on immune function.  Opioid Induced Hyperalgesia: Human studies have demonstrated that prolonged use of opioids can lead to a state of abnormal pain sensitivity, sometimes called opioid induced hyperalgesia (OIH). Opioid induced hyperalgesia is not usually seen in the absence of tolerance to opioid analgesia. Clinically, hyperalgesia may be diagnosed if the patient on long-term opioid therapy presents with increased pain. This might be qualitatively and anatomically distinct from pain related to disease progression or to breakthrough pain resulting from development of opioid tolerance. Pain associated with hyperalgesia tends to be more diffuse than the pre-existing pain and less defined in quality. Management of opioid induced hyperalgesia requires opioid dose reduction.  Cancer: Chronic opioid therapy has been associated with an increased risk of cancer  among noncancer patients with chronic pain. This association was more evident in chronic strong opioid users. Chronic opioid consumption causes significant pathological changes in the small intestine and colon. Epidemiological studies have found that there is a link between opium dependence and initiation of gastrointestinal cancers. Cancer is the second leading cause of death after cardiovascular disease. Chronic use of opioids can cause multiple conditions such as GERD, immunosuppression and renal damage as well as carcinogenic effects, which are associated with the incidence of cancers.   Mortality: Long-term opioid use has been associated with increased mortality among patients with chronic non-cancer pain (CNCP).  Prescription of long-acting opioids for chronic noncancer pain was associated with a significantly increased risk of all-cause mortality, including deaths from causes other than overdose.  Reference: Von Korff M, Kolodny A, Deyo RA, Chou R. Long-term opioid therapy reconsidered. Ann Intern Med. 2011  Sep 6;155(5):325-8. doi: 10.7326/[id number]. PMID: 64403474; PMCID: [id number]. Randon Goldsmith, Hayward RA, Dunn KM, [place] KP. Risk of adverse events in patients prescribed long-term opioids: A cohort study in the [place] [facility] Research Datalink. Eur J Pain. 2019 May;23(5):908-922. doi: 10.1002/ejp.1357. Epub 2019 Jan 31. PMID: 43329518. Colameco S, Coren JS, Ciervo CA. Continuous opioid treatment for chronic noncancer pain: a time for moderation in prescribing. Postgrad Med. 2009 Jul;121(4):61-6. doi: 10.3810/pgm.2009.07.2032. PMID: 84166063. William Hamburger RN, [place] SD, Blazina I, Cristopher Peru, Bougatsos C, Deyo RA. The effectiveness and risks of long-term opioid therapy for chronic pain: a systematic review for a [employer] of [employer] Pathways to [employer]. Ann Intern Med. 2015 Feb 17;162(4):276-86. doi: 10.7326/M14-2559. PMID: 01601093. Caryl Bis [facility], Makuc DM. NCHS Data Brief No. 22. [place]: Centers for Disease Control and Prevention; 2009. Sep, Increase in Fatal Poisonings Involving Opioid Analgesics in the [place], 1999-2006. Song IA, Choi HR, Oh TK. Long-term opioid use and mortality in patients with chronic non-cancer pain: Ten-year follow-up study in [place] from 2010 through 2019. EClinicalMedicine. 2022 Jul 18;51:101558. doi: 10.1016/j.eclinm.2022.235573. PMID: 22025427; PMCID: [id number]. Huser, W., Schubert, T., Vogelmann, T. et al. All-cause mortality in patients with long-term opioid therapy compared with non-opioid analgesics for chronic non-cancer pain: a database study. BMC Med 18, 162 (2020). http://lester.info/ Rashidian H, Karie Kirks, Malekzadeh R, Haghdoost AA. An Ecological Study of the Association between Opiate Use and Incidence of Cancers. Addict Health. 2016 Fall;8(4):252-260. PMID: 51761607; PMCID: [id number].  Our Goal: Our goal is to control your  pain with means other than the use of opioid pain medications.  Our Recommendation: Talk to your physician about coming off of these medications. We can assist you with the tapering down and stopping these medicines. Based on the new information, even if you cannot completely stop the medication, a decrease in the dose may be associated with a lesser risk. Ask for other means of controlling the pain. Decrease or eliminate those factors that significantly contribute to your pain such as smoking, obesity, and a diet heavily tilted towards "inflammatory" nutrients.  Last Updated: 05/27/2023   ____________________________________________________________________________________________     ____________________________________________________________________________________________  National Pain Medication Shortage  The [place] is experiencing worsening drug shortages. These have had a negative widespread effect on patient care and treatment. Not expected to improve any time soon. Predicted to last past 2029.   Drug shortage list (generic names) Oxycodone IR Oxycodone/APAP Oxymorphone IR Hydromorphone Hydrocodone/APAP Morphine  Where is the problem?  Manufacturing and supply level.  Will this shortage affect you?  Only if you  take any of the above pain medications.  How? You may be unable to fill your prescription.  Your pharmacist may offer a "partial fill" of your prescription. (Warning: Do not accept partial fills.) Prescriptions partially filled cannot be transferred to another pharmacy. Read our Medication Rules and Regulation. Depending on how much medicine you are dependent on, you may experience withdrawals when unable to get the medication.  Recommendations: Consider ending your dependence on opioid pain medications. Ask your pain specialist to assist you with the process. Consider switching to a medication currently not in shortage, such as Buprenorphine. Talk to your pain  specialist about this option. Consider decreasing your pain medication requirements by managing tolerance thru "Drug Holidays". This may help minimize withdrawals, should you run out of medicine. Control your pain thru the use of non-pharmacological interventional therapies.   Your prescriber: Prescribers cannot be blamed for shortages. Medication manufacturing and supply issues cannot be fixed by the prescriber.   NOTE: The prescriber is not responsible for supplying the medication, or solving supply issues. Work with your pharmacist to solve it. The patient is responsible for the decision to take or continue taking the medication and for identifying and securing a legal supply source. By law, supplying the medication is the job and responsibility of the pharmacy. The prescriber is responsible for the evaluation, monitoring, and prescribing of these medications.   Prescribers will NOT: Re-issue prescriptions that have been partially filled. Re-issue prescriptions already sent to a pharmacy.  Re-send prescriptions to a different pharmacy because yours did not have your medication. Ask pharmacist to order more medicine or transfer the prescription to another pharmacy. (Read below.)  New 2023 regulation: "July 20, 2022 Revised Regulation Allows DEA-Registered Pharmacies to Transfer Electronic Prescriptions at a Patient's Request DEA Headquarters Division - Public Information Office Patients now have the ability to request their electronic prescription be transferred to another pharmacy without having to go back to their practitioner to initiate the request. This revised regulation went into effect on Monday, July 16, 2022.     At a patient's request, a DEA-registered retail pharmacy can now transfer an electronic prescription for a controlled substance (schedules II-V) to another DEA-registered retail pharmacy. Prior to this change, patients would have to go through their practitioner to  cancel their prescription and have it re-issued to a different pharmacy. The process was taxing and time consuming for both patients and practitioners.    The Drug Enforcement Administration [facility]) published its intent to revise the process for transferring electronic prescriptions on October 07, 2020.  The final rule was published in the federal register on June 14, 2022 and went into effect 30 days later.  Under the final rule, a prescription can only be transferred once between pharmacies, and only if allowed under existing state or other applicable law. The prescription must remain in its electronic form; may not be altered in any way; and the transfer must be communicated directly between two licensed pharmacists. It's important to note, any authorized refills transfer with the original prescription, which means the entire prescription will be filled at the same pharmacy".  Reference: [email or web address].is [facility] website announcement)  [email or web address] [employer] of Justice)   [employer] / Vol. 88, No. 143 / Thursday, June 14, 2022 / Rules and Regulations DEPARTMENT OF JUSTICE  Drug Enforcement Administration  21 CFR Part 1306  [Docket No. [id number]]  RIN [id number] Transfer of Electronic Prescriptions for Schedules II-V Controlled Substances Between Pharmacies for Initial Filling  ____________________________________________________________________________________________  ____________________________________________________________________________________________  Transfer of Pain Medication between Pharmacies  Re: 2023 DEA Clarification on existing regulation  Published on DEA Website: July 20, 2022  Title: Revised Regulation Allows DEA-Registered Pharmacies to Electrical engineer Prescriptions at a Patient's  Request DEA Headquarters Division - [employer]  "Patients now have the ability to request their electronic prescription be transferred to another pharmacy without having to go back to their practitioner to initiate the request. This revised regulation went into effect on Monday, July 16, 2022.     At a patient's request, a DEA-registered retail pharmacy can now transfer an electronic prescription for a controlled substance (schedules II-V) to another DEA-registered retail pharmacy. Prior to this change, patients would have to go through their practitioner to cancel their prescription and have it re-issued to a different pharmacy. The process was taxing and time consuming for both patients and practitioners.    The Drug Enforcement Administration [facility]) published its intent to revise the process for transferring electronic prescriptions on October 07, 2020.  The final rule was published in the federal register on June 14, 2022 and went into effect 30 days later.  Under the final rule, a prescription can only be transferred once between pharmacies, and only if allowed under existing state or other applicable law. The prescription must remain in its electronic form; may not be altered in any way; and the transfer must be communicated directly between two licensed pharmacists. It's important to note, any authorized refills transfer with the original prescription, which means the entire prescription will be filled at the same pharmacy."    REFERENCES: 1. DEA website announcement [email or web address]  2. Department of Justice website  [email or web address]  3. DEPARTMENT OF JUSTICE Drug Enforcement Administration 21 CFR Part 1306 [Docket No. [id number]] RIN [id number] "Transfer of Electronic Prescriptions for Schedules II-V Controlled Substances  Between Pharmacies for Initial Filling"  ____________________________________________________________________________________________     _______________________________________________________________________  Medication Rules  Purpose: To inform patients, and their family members, of our medication rules and regulations.  Applies to: All patients receiving prescriptions from our practice (written or electronic).  Pharmacy of record: This is the pharmacy where your electronic prescriptions will be sent. Make sure we have the correct one.  Electronic prescriptions: In compliance with the [facility] Strengthen Opioid Misuse Prevention (STOP) Act of 2017 (Session Conni Elliot [phone]), effective November 19, 2018, all controlled substances must be electronically prescribed. Written prescriptions, faxing, or calling prescriptions to a pharmacy will no longer be done.  Prescription refills: These will be provided only during in-person appointments. No medications will be renewed without a "face-to-face" evaluation with your provider. Applies to all prescriptions.  NOTE: The following applies primarily to controlled substances (Opioid* Pain Medications).   Type of encounter (visit): For patients receiving controlled substances, face-to-face visits are required. (Not an option and not up to the patient.)  Patient's responsibilities: Pain Pills: Bring all pain pills to every appointment (except for procedure appointments). Pill Bottles: Bring pills in original pharmacy bottle. Bring bottle, even if empty. Always bring the bottle of the most recent fill.  Medication refills: You are responsible for knowing and keeping track of what medications you are taking and when is it that you will need a refill. The day before your appointment: write a list of all prescriptions that need to be refilled. The day of the appointment: give the list to the admitting nurse. Prescriptions will be written only  during appointments. No prescriptions will be written on procedure days. If you forget a  medication: it will not be "Called in", "Faxed", or "electronically sent". You will need to get another appointment to get these prescribed. No early refills. Do not call asking to have your prescription filled early. Partial  or short prescriptions: Occasionally your pharmacy may not have enough pills to fill your prescription.  NEVER ACCEPT a partial fill or a prescription that is short of the total amount of pills that you were prescribed.  With controlled substances the law allows 72 hours for the pharmacy to complete the prescription.  If the prescription is not completed within 72 hours, the pharmacist will require a new prescription to be written. This means that you will be short on your medicine and we WILL NOT send another prescription to complete your original prescription.  Instead, request the pharmacy to send a carrier to a nearby branch to get enough medication to provide you with your full prescription. Prescription Accuracy: You are responsible for carefully inspecting your prescriptions before leaving our office. Have the discharge nurse carefully go over each prescription with you, before taking them home. Make sure that your name is accurately spelled, that your address is correct. Check the name and dose of your medication to make sure it is accurate. Check the number of pills, and the written instructions to make sure they are clear and accurate. Make sure that you are given enough medication to last until your next medication refill appointment. Taking Medication: Take medication as prescribed. When it comes to controlled substances, taking less pills or less frequently than prescribed is permitted and encouraged. Never take more pills than instructed. Never take the medication more frequently than prescribed.  Inform other Doctors: Always inform, all of your healthcare providers, of all the  medications you take. Pain Medication from other Providers: You are not allowed to accept any additional pain medication from any other Doctor or Healthcare provider. There are two exceptions to this rule. (see below) In the event that you require additional pain medication, you are responsible for notifying us, as stated below. Cough Medicine: Often these contain an opioid, such as codeine or hydrocodone. Never accept or take cough medicine containing these opioids if you are already taking an opioid* medication. The combination may cause respiratory failure and death. Medication Agreement: You are responsible for carefully reading and following our Medication Agreement. This must be signed before receiving any prescriptions from our practice. Safely store a copy of your signed Agreement. Violations to the Agreement will result in no further prescriptions. (Additional copies of our Medication Agreement are available upon request.) Laws, Rules, & Regulations: All patients are expected to follow all [address] and [employer], [employer], Rules, [place] [employer]. Ignorance of the Laws does not constitute a valid excuse.  Illegal drugs and Controlled Substances: The use of illegal substances (including, but not limited to marijuana and its derivatives) and/or the illegal use of any controlled substances is strictly prohibited. Violation of this rule may result in the immediate and permanent discontinuation of any and all prescriptions being written by our practice. The use of any illegal substances is prohibited. Adopted CDC guidelines & recommendations: Target dosing levels will be at or below 60 MME/day. Use of benzodiazepines** is not recommended.  Exceptions: There are only two exceptions to the rule of not receiving pain medications from other Healthcare Providers. Exception #1 (Emergencies): In the event of an emergency (i.e.: accident requiring emergency care), you are allowed to receive additional pain  medication. However, you are responsible for: As soon as  you are able, call our office [phone], at any time of the day or night, and leave a message stating your name, the date and nature of the emergency, and the name and dose of the medication prescribed. In the event that your call is answered by a member of our staff, make sure to document and save the date, time, and the name of the person that took your information.  Exception #2 (Planned Surgery): In the event that you are scheduled by another doctor or dentist to have any type of surgery or procedure, you are allowed (for a period no longer than 30 days), to receive additional pain medication, for the acute post-op pain. However, in this case, you are responsible for picking up a copy of our "Post-op Pain Management for Surgeons" handout, and giving it to your surgeon or dentist. This document is available at our office, and does not require an appointment to obtain it. Simply go to our office during business hours (Monday-Thursday from 8:00 AM to 4:00 PM) (Friday 8:00 AM to 12:00 Noon) or if you have a scheduled appointment with [place], prior to your surgery, and ask for it by name. In addition, you are responsible for: calling our office [phone], at any time of the day or night, and leaving a message stating your name, name of your surgeon, type of surgery, and date of procedure or surgery. Failure to comply with your responsibilities may result in termination of therapy involving the controlled substances. Medication Agreement Violation. Following the above rules, including your responsibilities will help you in avoiding a Medication Agreement Violation ("Breaking your Pain Medication Contract").  Consequences:  Not following the above rules may result in permanent discontinuation of medication prescription therapy.  *Opioid medications include: morphine, codeine, oxycodone, oxymorphone, hydrocodone, hydromorphone, meperidine, tramadol,  tapentadol, buprenorphine, fentanyl, methadone. **Benzodiazepine medications include: diazepam (Valium), alprazolam (Xanax), clonazepam (Klonopine), lorazepam (Ativan), clorazepate (Tranxene), chlordiazepoxide (Librium), estazolam (Prosom), oxazepam (Serax), temazepam (Restoril), triazolam (Halcion) (Last updated: 09/11/2022) ______________________________________________________________________    ______________________________________________________________________  Medication Recommendations and Reminders  Applies to: All patients receiving prescriptions (written and/or electronic).  Medication Rules & Regulations: You are responsible for reading, knowing, and following our "Medication Rules" document. These exist for your safety and that of others. They are not flexible and neither are we. Dismissing or ignoring them is an act of "non-compliance" that may result in complete and irreversible termination of such medication therapy. For safety reasons, "non-compliance" will not be tolerated. As with the [place] fundamental legal principle of "ignorance of the law is no defense", we will accept no excuses for not having read and knowing the content of documents provided to you by our practice.  Pharmacy of record:  Definition: This is the pharmacy where your electronic prescriptions will be sent.  We do not endorse any particular pharmacy. It is up to you and your insurance to decide what pharmacy to use.  We do not restrict you in your choice of pharmacy. However, once we write for your prescriptions, we will NOT be re-sending more prescriptions to fix restricted supply problems created by your pharmacy, or your insurance.  The pharmacy listed in the electronic medical record should be the one where you want electronic prescriptions to be sent. If you choose to change pharmacy, simply notify our nursing staff. Changes will be made only during your regular appointments and not over the  phone.  Recommendations: Keep all of your pain medications in a safe place, under lock and key, even  if you live alone. We will NOT replace lost, stolen, or damaged medication. We do not accept "Police Reports" as proof of medications having been stolen. After you fill your prescription, take 1 week's worth of pills and put them away in a safe place. You should keep a separate, properly labeled bottle for this purpose. The remainder should be kept in the original bottle. Use this as your primary supply, until it runs out. Once it's gone, then you know that you have 1 week's worth of medicine, and it is time to come in for a prescription refill. If you do this correctly, it is unlikely that you will ever run out of medicine. To make sure that the above recommendation works, it is very important that you make sure your medication refill appointments are scheduled at least 1 week before you run out of medicine. To do this in an effective manner, make sure that you do not leave the office without scheduling your next medication management appointment. Always ask the nursing staff to show you in your prescription , when your medication will be running out. Then arrange for the receptionist to get you a return appointment, at least 7 days before you run out of medicine. Do not wait until you have 1 or 2 pills left, to come in. This is very poor planning and does not take into consideration that we may need to cancel appointments due to bad weather, sickness, or emergencies affecting our staff. DO NOT ACCEPT A "Partial Fill": If for any reason your pharmacy does not have enough pills/tablets to completely fill or refill your prescription, do not allow for a "partial fill". The law allows the pharmacy to complete that prescription within 72 hours, without requiring a new prescription. If they do not fill the rest of your prescription within those 72 hours, you will need a separate prescription to fill the remaining  amount, which we will NOT provide. If the reason for the partial fill is your insurance, you will need to talk to the pharmacist about payment alternatives for the remaining tablets, but again, DO NOT ACCEPT A PARTIAL FILL, unless you can trust your pharmacist to obtain the remainder of the pills within 72 hours.  Prescription refills and/or changes in medication(s):  Prescription refills, and/or changes in dose or medication, will be conducted only during scheduled medication management appointments. (Applies to both, written and electronic prescriptions.) No refills on procedure days. No medication will be changed or started on procedure days. No changes, adjustments, and/or refills will be conducted on a procedure day. Doing so will interfere with the diagnostic portion of the procedure. No phone refills. No medications will be "called into the pharmacy". No Fax refills. No weekend refills. No Holliday refills. No after hours refills.  Remember:  Business hours are:  Monday to Thursday 8:00 AM to 4:00 PM Provider's Schedule: Delano Metz, MD - Appointments are:  Medication management: Monday and Wednesday 8:00 AM to 4:00 PM Procedure day: Tuesday and Thursday 7:30 AM to 4:00 PM Edward Jolly, MD - Appointments are:  Medication management: Tuesday and Thursday 8:00 AM to 4:00 PM Procedure day: Monday and Wednesday 7:30 AM to 4:00 PM (Last update: 09/11/2022) ______________________________________________________________________   ____________________________________________________________________________________________  Naloxone Nasal Spray  Why am I receiving this medication? [place] STOP ACT requires that all patients taking high dose opioids or at risk of opioids respiratory depression, be prescribed an opioid reversal agent, such as Naloxone (AKA: Narcan).  What is this medication? NALOXONE (  nal OX one) treats opioid overdose, which causes slow or shallow breathing,  severe drowsiness, or trouble staying awake. Call emergency services after using this medication. You may need additional treatment. Naloxone works by reversing the effects of opioids. It belongs to a group of medications called opioid blockers.  COMMON BRAND NAME(S): Kloxxado, Narcan  What should I tell my care team before I take this medication? They need to know if you have any of these conditions: Heart disease Substance use disorder An unusual or allergic reaction to naloxone, other medications, foods, dyes, or preservatives Pregnant or trying to get pregnant Breast-feeding  When to use this medication? This medication is to be used for the treatment of respiratory depression (less than 8 breaths per minute) secondary to opioid overdose.   How to use this medication? This medication is for use in the nose. Lay the person on their back. Support their neck with your hand and allow the head to tilt back before giving the medication. The nasal spray should be given into 1 nostril. After giving the medication, move the person onto their side. Do not remove or test the nasal spray until ready to use. Get emergency medical help right away after giving the first dose of this medication, even if the person wakes up. You should be familiar with how to recognize the signs and symptoms of a narcotic overdose. If more doses are needed, give the additional dose in the other nostril. Talk to your care team about the use of this medication in children. While this medication may be prescribed for children as young as newborns for selected conditions, precautions do apply.  Naloxone Overdosage: If you think you have taken too much of this medicine contact a poison control center or emergency room at once.  NOTE: This medicine is only for you. Do not share this medicine with others.  What if I miss a dose? This does not apply.  What may interact with this medication? This is only used during an  emergency. No interactions are expected during emergency use. This list may not describe all possible interactions. Give your health care provider a list of all the medicines, herbs, non-prescription drugs, or dietary supplements you use. Also tell them if you smoke, drink alcohol, or use illegal drugs. Some items may interact with your medicine.  What should I watch for while using this medication? Keep this medication ready for use in the case of an opioid overdose. Make sure that you have the phone number of your care team and local hospital ready. You may need to have additional doses of this medication. Each nasal spray contains a single dose. Some emergencies may require additional doses. After use, bring the treated person to the nearest hospital or call 911. Make sure the treating care team knows that the person has received a dose of this medication. You will receive additional instructions on what to do during and after use of this medication before an emergency occurs.  What side effects may I notice from receiving this medication? Side effects that you should report to your care team as soon as possible: Allergic reactions--skin rash, itching, hives, swelling of the face, lips, tongue, or throat Side effects that usually do not require medical attention (report these to your care team if they continue or are bothersome): Constipation Dryness or irritation inside the nose Headache Increase in blood pressure Muscle spasms Stuffy nose Toothache This list may not describe all possible side effects. Call your doctor for  medical advice about side effects. You may report side effects to FDA at 1-800-FDA-1088.  Where should I keep my medication? Because this is an emergency medication, you should keep it with you at all times.  Keep out of the reach of children and pets. Store between 20 and 25 degrees C (68 and 77 degrees F). Do not freeze. Throw away any unused medication after the  expiration date. Keep in original box until ready to use.  NOTE: This sheet is a summary. It may not cover all possible information. If you have questions about this medicine, talk to your doctor, pharmacist, or health care provider.   2023 [employer]/Gold Standard (2021-07-14 00:00:00)  ____________________________________________________________________________________________

## 2023-07-10 ENCOUNTER — Encounter: Payer: Self-pay | Admitting: Pain Medicine

## 2023-07-10 ENCOUNTER — Ambulatory Visit: Payer: Medicare PPO | Admitting: Pain Medicine

## 2023-07-10 VITALS — BP 135/83 | HR 89 | Temp 96.6°F | Ht 60.0 in | Wt 170.0 lb

## 2023-07-10 DIAGNOSIS — G8929 Other chronic pain: Secondary | ICD-10-CM | POA: Diagnosis present

## 2023-07-10 DIAGNOSIS — M0579 Rheumatoid arthritis with rheumatoid factor of multiple sites without organ or systems involvement: Secondary | ICD-10-CM | POA: Diagnosis present

## 2023-07-10 DIAGNOSIS — M542 Cervicalgia: Secondary | ICD-10-CM | POA: Diagnosis present

## 2023-07-10 DIAGNOSIS — Z79891 Long term (current) use of opiate analgesic: Secondary | ICD-10-CM

## 2023-07-10 DIAGNOSIS — M47812 Spondylosis without myelopathy or radiculopathy, cervical region: Secondary | ICD-10-CM

## 2023-07-10 DIAGNOSIS — M5136 Other intervertebral disc degeneration, lumbar region: Secondary | ICD-10-CM

## 2023-07-10 DIAGNOSIS — M7918 Myalgia, other site: Secondary | ICD-10-CM | POA: Diagnosis present

## 2023-07-10 DIAGNOSIS — M51369 Other intervertebral disc degeneration, lumbar region without mention of lumbar back pain or lower extremity pain: Secondary | ICD-10-CM

## 2023-07-10 DIAGNOSIS — G894 Chronic pain syndrome: Secondary | ICD-10-CM | POA: Diagnosis not present

## 2023-07-10 DIAGNOSIS — Z79899 Other long term (current) drug therapy: Secondary | ICD-10-CM | POA: Diagnosis present

## 2023-07-10 DIAGNOSIS — M48062 Spinal stenosis, lumbar region with neurogenic claudication: Secondary | ICD-10-CM

## 2023-07-10 DIAGNOSIS — M545 Low back pain, unspecified: Secondary | ICD-10-CM

## 2023-07-10 DIAGNOSIS — M25551 Pain in right hip: Secondary | ICD-10-CM

## 2023-07-10 DIAGNOSIS — M47816 Spondylosis without myelopathy or radiculopathy, lumbar region: Secondary | ICD-10-CM

## 2023-07-10 MED ORDER — HYDROCODONE-ACETAMINOPHEN 10-325 MG PO TABS: 1.0000 | ORAL_TABLET | Freq: Three times a day (TID) | ORAL | 0 refills | Status: DC

## 2023-07-10 MED ORDER — NALOXONE HCL 4 MG/0.1ML NA LIQD: 1.0000 | NASAL | 0 refills | Status: AC | PRN

## 2023-07-10 NOTE — Progress Notes (Signed)
Safety precautions to be maintained throughout the outpatient stay will include: orient to surroundings, keep bed in low position, maintain call bell within reach at all times, provide assistance with transfer out of bed and ambulation.   Nursing Pain Medication Assessment:  Safety precautions to be maintained throughout the outpatient stay will include: orient to surroundings, keep bed in low position, maintain call bell within reach at all times, provide assistance with transfer out of bed and ambulation.  Medication Inspection Compliance: Pill count conducted under aseptic conditions, in front of the patient. Neither the pills nor the bottle was removed from the patient's sight at any time. Once count was completed pills were immediately returned to the patient in their original bottle.  Medication: Hydrocodone/APAP Pill/Patch Count:  1 of 90 pills remain Pill/Patch Appearance: Markings consistent with prescribed medication Bottle Appearance: Standard pharmacy container. Clearly labeled. Filled Date: 7 / 47 / 2024 Last Medication intake:  Today

## 2023-07-13 LAB — TOXASSURE SELECT 13 (MW), URINE

## 2023-08-14 ENCOUNTER — Ambulatory Visit: Payer: Medicare PPO | Admitting: Pulmonary Disease

## 2023-08-14 ENCOUNTER — Encounter: Payer: Self-pay | Admitting: Pulmonary Disease

## 2023-08-14 VITALS — BP 110/70 | HR 76 | Temp 97.6°F | Ht 60.0 in | Wt 171.6 lb

## 2023-08-14 DIAGNOSIS — J9811 Atelectasis: Secondary | ICD-10-CM | POA: Diagnosis not present

## 2023-08-14 DIAGNOSIS — G4733 Obstructive sleep apnea (adult) (pediatric): Secondary | ICD-10-CM

## 2023-08-14 DIAGNOSIS — J4520 Mild intermittent asthma, uncomplicated: Secondary | ICD-10-CM

## 2023-08-14 MED ORDER — FLUTICASONE FUROATE-VILANTEROL 200-25 MCG/ACT IN AEPB: 1.0000 | INHALATION_SPRAY | Freq: Every day | RESPIRATORY_TRACT | 6 refills | Status: AC

## 2023-08-14 NOTE — Progress Notes (Addendum)
Synopsis: Referred in by Leanna Sato, MD   Subjective:   PATIENT ID: Mariah Poole GENDER: female DOB: [birth year], MRN: [id number]  Chief Complaint  Patient presents with   Follow-up    Cough, shortness of breath and wheezing with exertion.     HPI [name] is a 77 year old female patient who with a past medical history of heart failure with preserved EF, asthma with persistent obstruction presenting today to the pulmonary clinic for follow-up visit on her asthma.  She last saw [name] and was advised to continue Symbicort 2 puffs twice daily and albuterol as needed.  She underwent extensive workup for dyspnea included   VQ scan in October 2022 which was negative for PE  Chest x-ray in 2022 showed moderate elevation of the right hemidiaphragm which was new since 2016.  This prompted a sniff test that showed normal motion of the diaphragm with overall relatively reduced excursion.  Inflammatory workup including rheumatoid factor ESR CRP and anti-CCP were negative.  She underwent PFTs that showed an FEV1 of 57% and FVC of 54% of predicted ratio of 80 with positive bronchodilator response.  TLC restricted at 44%.  DLCO mildly reduced.  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    Family History - Negative for pulmonary diseases.   Social History - Never smoker/Denies vaping.  ROS All systems were reviewed and are negative except for the above. Objective:   Vitals:   08/14/23 1449  BP: 110/70  Pulse: 76  Temp: 97.6 F (36.4 C)  TempSrc: Temporal  SpO2: 97%  Weight: 171 lb 9.6 oz (77.8 kg)  Height: 5' (1.524 m)   97% on RA BMI Readings from Last 3 Encounters:  08/14/23 33.51 kg/m  07/10/23 33.20 kg/m  03/27/23 36.13 kg/m   Wt Readings from Last 3 Encounters:  08/14/23 171 lb 9.6 oz (77.8 kg)  07/10/23 170 lb (77.1 kg)  03/27/23 185 lb (83.9 kg)    Physical Exam GEN: NAD, Healthy Appearing HEENT: Supple Neck,  Reactive Pupils, EOMI  CVS: Normal S1, Normal S2, RRR, No murmurs or ES appreciated  Lungs: Bibasilar crackles.  Abdomen: Soft, non tender, non distended, + BS  Extremities: Warm and well perfused, No edema  Skin: No suspicious lesions appreciated  Psych: Normal Affect  Ancillary Information   CBC    Component Value Date/Time   WBC 6.4 09/14/2021 1223   RBC 4.66 09/14/2021 1223   HGB 13.9 09/14/2021 1223   HGB 10.1 (L) 12/19/2012 0444   HCT 44.6 09/14/2021 1223   HCT 32.3 (L) 12/19/2012 0444   PLT 367 09/14/2021 1223   PLT 370 12/19/2012 0444   MCV 95.7 09/14/2021 1223   MCV 87 12/19/2012 0444   MCH 29.8 09/14/2021 1223   MCHC 31.2 09/14/2021 1223   RDW 13.2 09/14/2021 1223   RDW 14.5 12/19/2012 0444   LYMPHSABS 1.9 09/14/2021 1223   LYMPHSABS 3.0 12/19/2012 0444   MONOABS 0.4 09/14/2021 1223   MONOABS 1.1 (H) 12/19/2012 0444   EOSABS 0.1 09/14/2021 1223   EOSABS 0.2 12/19/2012 0444   BASOSABS 0.0 09/14/2021 1223   BASOSABS 0.0 12/19/2012 0444    Imaging       No data to display           Assessment & Plan:  [name] is a 77 year old female patient who with a past medical history of heart failure with preserved EF, asthma with persistent obstruction presenting today to  the pulmonary clinic for follow-up visit on her asthma.  #Moderate Persistent Asthma  #Bibasilar atelectasis with low lung volumes possibly secondary to sedentary lifestyle. Diaphragm function work up was underwhelming. CXR with significantly reduced lung volumes.  #Mild OSA with AHI 11.4  []  Obtain CT chest wo contrast  []  Prescribed Fluticasone-Vilanterol [Breo ellipta] 200 1 puff daily instead of Symbicort for compliance.  []  AutoCPAP 5-15 cmH2O ordered. Unclear why this was not ordered after her sleep study in December 2023 as it was done in another facility. She does have ongoing apneic episodes, fatigue during the day and I think she would highly benefit from CPAP at bedtime.   Return  in about 3 months (around 11/13/2023).  I spent 60 minutes caring for this patient today, including preparing to see the patient, obtaining a medical history , reviewing a separately obtained history, performing a medically appropriate examination and/or evaluation, counseling and educating the patient/family/caregiver, ordering medications, tests, or procedures, documenting clinical information in the electronic health record, and independently interpreting results (not separately reported/billed) and communicating results to the patient/family/caregiver  Janann Colonel, MD [facility] 08/14/2023 4:00 PM

## 2023-08-22 ENCOUNTER — Telehealth: Payer: Self-pay | Admitting: Pulmonary Disease

## 2023-08-22 NOTE — Telephone Encounter (Signed)
[facility] needs an addend note stating why cpap wasn't ordered after sleep study was done. They also need notes concerning current sleep concerns. Faxed to [phone]

## 2023-08-22 NOTE — Telephone Encounter (Signed)
Dr. Larinda Buttery, please see below message and advise. Thanks

## 2023-08-23 NOTE — Telephone Encounter (Signed)
Synetta Fail, please send updated note to [facility].

## 2023-08-23 NOTE — Telephone Encounter (Signed)
I called and spoke with Mariah Poole about the reason she was never setup on Cpap. She stated she didn't know who ordered the sleep study and no one ever contacted her to let her know she needed Cpap. I sent this information to [facility] also letting them know Dr. Candis Musa note has been updated

## 2023-08-23 NOTE — Telephone Encounter (Signed)
Dr. Larinda Buttery, can you add this to your last office note.

## 2023-08-26 NOTE — Telephone Encounter (Signed)
[facility] has the order and the information needed as to why patient was never setup on Cpap before now

## 2023-10-02 ENCOUNTER — Ambulatory Visit: Payer: Medicare PPO | Attending: Pain Medicine | Admitting: Pain Medicine

## 2023-10-02 ENCOUNTER — Encounter: Payer: Self-pay | Admitting: Pain Medicine

## 2023-10-02 VITALS — BP 151/86 | HR 63 | Temp 97.2°F | Resp 16 | Wt 167.0 lb

## 2023-10-02 DIAGNOSIS — Z79899 Other long term (current) drug therapy: Secondary | ICD-10-CM | POA: Diagnosis present

## 2023-10-02 DIAGNOSIS — M5136 Other intervertebral disc degeneration, lumbar region with discogenic back pain only: Secondary | ICD-10-CM | POA: Diagnosis present

## 2023-10-02 DIAGNOSIS — M0579 Rheumatoid arthritis with rheumatoid factor of multiple sites without organ or systems involvement: Secondary | ICD-10-CM | POA: Diagnosis present

## 2023-10-02 DIAGNOSIS — M51369 Other intervertebral disc degeneration, lumbar region without mention of lumbar back pain or lower extremity pain: Secondary | ICD-10-CM | POA: Insufficient documentation

## 2023-10-02 DIAGNOSIS — M7918 Myalgia, other site: Secondary | ICD-10-CM | POA: Diagnosis present

## 2023-10-02 DIAGNOSIS — M25551 Pain in right hip: Secondary | ICD-10-CM | POA: Insufficient documentation

## 2023-10-02 DIAGNOSIS — M47812 Spondylosis without myelopathy or radiculopathy, cervical region: Secondary | ICD-10-CM | POA: Diagnosis present

## 2023-10-02 DIAGNOSIS — M47816 Spondylosis without myelopathy or radiculopathy, lumbar region: Secondary | ICD-10-CM

## 2023-10-02 DIAGNOSIS — M47817 Spondylosis without myelopathy or radiculopathy, lumbosacral region: Secondary | ICD-10-CM | POA: Diagnosis present

## 2023-10-02 DIAGNOSIS — M48062 Spinal stenosis, lumbar region with neurogenic claudication: Secondary | ICD-10-CM | POA: Diagnosis present

## 2023-10-02 DIAGNOSIS — G8929 Other chronic pain: Secondary | ICD-10-CM

## 2023-10-02 DIAGNOSIS — M5459 Other low back pain: Secondary | ICD-10-CM | POA: Insufficient documentation

## 2023-10-02 DIAGNOSIS — M542 Cervicalgia: Secondary | ICD-10-CM | POA: Diagnosis present

## 2023-10-02 DIAGNOSIS — Z79891 Long term (current) use of opiate analgesic: Secondary | ICD-10-CM

## 2023-10-02 DIAGNOSIS — M25612 Stiffness of left shoulder, not elsewhere classified: Secondary | ICD-10-CM | POA: Diagnosis present

## 2023-10-02 DIAGNOSIS — M069 Rheumatoid arthritis, unspecified: Secondary | ICD-10-CM

## 2023-10-02 DIAGNOSIS — G894 Chronic pain syndrome: Secondary | ICD-10-CM | POA: Diagnosis present

## 2023-10-02 DIAGNOSIS — M25512 Pain in left shoulder: Secondary | ICD-10-CM | POA: Diagnosis present

## 2023-10-02 DIAGNOSIS — M545 Low back pain, unspecified: Secondary | ICD-10-CM | POA: Insufficient documentation

## 2023-10-02 MED ORDER — HYDROCODONE-ACETAMINOPHEN 10-325 MG PO TABS: 1.0000 | ORAL_TABLET | Freq: Three times a day (TID) | ORAL | 0 refills | Status: DC

## 2023-10-02 NOTE — Progress Notes (Signed)
PROVIDER NOTE: Information contained herein reflects review and annotations entered in association with encounter. Interpretation of such information and data should be left to medically-trained personnel. Information provided to patient can be located elsewhere in the medical record under "Patient Instructions". Document created using STT-dictation technology, any transcriptional errors that may result from process are unintentional.    Patient: Mariah Poole  Service Category: E/M  Provider: Oswaldo Done, MD  DOB: July 26, 1946  DOS: 10/02/2023  Referring Provider: Leanna Sato, MD  MRN: [id number]  Specialty: Interventional Pain Management  PCP: Leanna Sato, MD  Type: Established Patient  Setting: Ambulatory outpatient    Location: Office  Delivery: Face-to-face     HPI  Ms. HOLLYN FRENZ, a 77 y.o. year old female, is here today because of her Chronic bilateral low back pain without sciatica [M54.50, G89.29]. Ms. Steagall primary complain today is Back Pain (Lower, right)  Pertinent problems: Ms. 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, L4-5, L5-S1) w/ neurogenic claudication; Lumbar foraminal stenosis (Multilevel) (Bilateral); Cervicalgia; DDD (degenerative disc disease), cervical; Cervical facet hypertrophy; Cervical facet joint syndrome; Lumbar facet hypertrophy; 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); Deformity of hands due to rheumatoid arthritis (HCC) (Bilateral); Chronic hand pain, right; Primary osteoarthritis of both hands; Neurogenic pain; Rheumatoid arthritis  involving both hands with positive rheumatoid factor (HCC); Chronic musculoskeletal pain; Bilateral leg edema; Chronic fatigue; Edema, lower extremity; Rheumatoid arthritis involving multiple sites with positive rheumatoid factor (HCC); Osteoarthritis of hip (Right); Lumbar facet joint pain; Chronic shoulder pain (Left); Rheumatoid arthritis of shoulder (HCC) (Left); and Decreased range of motion of shoulder (Left) on their pertinent problem list. Pain Assessment: Severity of Chronic pain is reported as a 6 /10. Location: Back Right, Lower/right buttock and right hip. Onset: More than a month ago. Quality: Jabbing. Timing: Constant. Modifying factor(s): Hydrocodone, rest. Vitals:  weight is 167 lb (75.8 kg). Her temporal temperature is 97.2 F (36.2 C) (abnormal). Her blood pressure is 151/86 (abnormal) and her pulse is 63. Her respiration is 16 and oxygen saturation is 98%.  BMI: Estimated body mass index is 32.61 kg/m as calculated from the following:   Height as of 08/14/23: 5' (1.524 m).   Weight as of this encounter: 167 lb (75.8 kg). Last encounter: 07/10/2023. Last procedure: Visit date not found.  Reason for encounter: medication management.   The patient indicates doing well with the current medication regimen. No adverse reactions or side effects reported to the medications.  In addition the patient comes in today indicating that she is having a flareup of her left shoulder pain and lower back.  Evaluation today demonstrated the patient to have painful restricted range of motion of the left shoulder.  Today we will be ordering an x-ray of that shoulder and we will tentatively schedule her to return for a left intra-articular shoulder joint injection under fluoroscopic guidance.  Once we have addressed the issue with the shoulder which is the 1 that she has indicated to be the worst, then we will turn our attention to her lower back again.  The plan was shared with the patient who understood and  accepted.  RTCB: 01/06/2024  Pharmacotherapy Assessment  Analgesic: Hydrocodone/APAP 10/325, 1 tab PO q 8 hrs PRN (30 mg/day of hydrocodone) (975 mg/day of acetaminophen) MME/day: 30 mg/day.   Monitoring: [place] PMP: PDMP reviewed during this encounter.       Pharmacotherapy: No side-effects or adverse reactions reported. Compliance: No problems identified. Effectiveness: Clinically acceptable.  Concepcion Elk, RN  10/02/2023  1:46 PM  Sign when Signing Visit Nursing Pain Medication Assessment:  Safety precautions to be maintained throughout the outpatient stay will include: orient to surroundings, keep bed in low position, maintain call bell within reach at all times, provide assistance with transfer out of bed and ambulation.  Medication Inspection Compliance: Pill count conducted under aseptic conditions, in front of the patient. Neither the pills nor the bottle was removed from the patient's sight at any time. Once count was completed pills were immediately returned to the patient in their original bottle.  Medication: Hydrocodone/APAP Pill/Patch Count:  13 of 90 pills remain Pill/Patch Appearance: Markings consistent with prescribed medication Bottle Appearance: Standard pharmacy container. Clearly labeled. Filled Date: 45 / 21 / 2024 Last Medication intake:  Today    No results found for: "CBDTHCR" No results found for: "D8THCCBX" No results found for: "D9THCCBX"  UDS:  Summary  Date Value Ref Range Status  07/10/2023 Note  Final    Comment:    ==================================================================== [facility] (MW) ==================================================================== Test                             Result       Flag       Units  Drug Present and Declared for Prescription Verification   Hydrocodone                    2209         EXPECTED   ng/mg creat   Hydromorphone                  439          EXPECTED   ng/mg creat    Dihydrocodeine                 596          EXPECTED   ng/mg creat   Norhydrocodone                 4188         EXPECTED   ng/mg creat    Sources of hydrocodone include scheduled prescription medications.    Hydromorphone, dihydrocodeine and norhydrocodone are expected    metabolites of hydrocodone. Hydromorphone and dihydrocodeine are    also available as scheduled prescription medications.  ==================================================================== Test                      Result    Flag   Units      Ref Range   Creatinine              57               mg/dL      >=16 ==================================================================== Declared Medications:  The flagging and interpretation on this report are based on the  following declared medications.  Unexpected results may arise from  inaccuracies in the declared medications.   **Note: The testing scope of this panel includes these medications:   Hydrocodone (Norco)   **Note: The testing scope of this panel  does not include the  following reported medications:   Acetaminophen (Norco)  Albuterol (Ventolin HFA)  Atenolol (Tenormin)  Budesonide (Symbicort)  Duloxetine (Cymbalta)  Famotidine (Pepcid)  Formoterol (Symbicort)  Furosemide (Lasix)  Iron  Losartan (Cozaar)  Magnesium (Mag-Ox)  Meloxicam (Mobic)  Montelukast (Singulair)  Naloxone (Narcan)  Pantoprazole (Protonix)  Potassium (Klor-Con)  Pravastatin (Pravachol)  Semaglutide (Ozempic)  Spironolactone (Aldactone)  Topical Lidocaine  Turmeric  Vitamin C  Vitamin D3 ==================================================================== For clinical consultation, please call [phone]. ====================================================================       ROS  Constitutional: Denies any fever or chills Gastrointestinal: No reported hemesis, hematochezia, vomiting, or acute GI distress Musculoskeletal: Denies any acute onset joint  swelling, redness, loss of ROM, or weakness Neurological: No reported episodes of acute onset apraxia, aphasia, dysarthria, agnosia, amnesia, paralysis, loss of coordination, or loss of consciousness  Medication Review  DULoxetine, HYDROcodone-acetaminophen, Lidocaine, Semaglutide(0.25 or 0.5MG /DOS), Turmeric Curcumin, Vitamin D3, albuterol, ascorbic acid, atenolol, budesonide-formoterol, famotidine, ferrous sulfate, fluticasone furoate-vilanterol, furosemide, losartan, magnesium oxide, meloxicam, montelukast, naloxone, pantoprazole, potassium chloride SA, pravastatin, and spironolactone  History Review  Allergy: Ms. Mersinger is allergic to contrast media [iodinated contrast media], ioxaglate, and shellfish allergy. Drug: Ms. Bobb  reports current drug use. Alcohol:  reports no history of alcohol use. Tobacco:  reports that she has never smoked. She has never used smokeless tobacco. Social: Ms. Balter  reports that she has never smoked. She has never used smokeless tobacco. She reports current drug use. She reports that she does not drink alcohol. Medical:  has a past medical history of Anemia, Arthritis, COPD (chronic obstructive pulmonary disease) (HCC), Depression, Diabetes mellitus without complication (HCC), GERD (gastroesophageal reflux disease), Hiatal hernia, History of contrast media allergy. (IVP dye) (09/01/2015), History of hiatal hernia, Hypercholesteremia, Hypertension, Morbid obesity (HCC), Scoliosis, and Shortness of breath dyspnea. Surgical: Ms. Faro  has a past surgical history that includes Abdominal hysterectomy; Cataract extraction w/ intraocular lens implant (Right); Gastric bypass; Hernia repair; Cataract extraction w/PHACO (Left, 07/19/2015); Cholecystectomy; and Hernia repair. Family: family history includes Diabetes in her mother; Heart disease in her father.  Laboratory Chemistry Profile   Renal Lab Results  Component Value Date   BUN 14 09/14/2021   CREATININE  1.02 (H) 09/14/2021   BCR 21 06/17/2019   GFRAA 53 (L) 06/17/2019   GFRNONAA 57 (L) 09/14/2021    Hepatic Lab Results  Component Value Date   AST 19 09/14/2021   ALT 11 09/14/2021   ALBUMIN 3.8 09/14/2021   ALKPHOS 66 09/14/2021   LIPASE 19 10/27/2015    Electrolytes Lab Results  Component Value Date   NA 140 09/14/2021   K 4.3 09/14/2021   CL 102 09/14/2021   CALCIUM 9.3 09/14/2021   MG 1.9 06/17/2019    Bone Lab Results  Component Value Date   25OHVITD1 45 06/17/2019   25OHVITD2 <1.0 06/17/2019   25OHVITD3 45 06/17/2019    Inflammation (CRP: Acute Phase) (ESR: Chronic Phase) Lab Results  Component Value Date   CRP 7 06/17/2019   ESRSEDRATE 49 (H) 06/17/2019   LATICACIDVEN 4.0 (H) 11/23/2009         Note: Above Lab results reviewed.  Recent Imaging Review  DG Lumbar Spine Complete W/Bend CLINICAL DATA:  Degenerative disc disease  Chronic bilateral low back pain without sciatica  EXAM: LUMBAR SPINE - COMPLETE WITH BENDING VIEWS  COMPARISON:  01/06/2013  FINDINGS: Multiple surgical clips seen in the left upper quadrant. IVC filter seen in the right paraspinal region. Fractured leg of the  IVC filter is seen more superiorly at the L2-L3 level.  Grade 1 anterolisthesis of L5 on S1 which does not significantly change with flexion or extension. There is development of grade 1 anterolisthesis of L3 on L4 and L4 on L5 with flexion as well as extension, which improves with neutral positioning. Severe disc height loss and endplate degenerative changes again seen at L4-L5 and L5-S1. Advanced disc height loss and endplate degenerative changes seen throughout the visualized lower thoracic spine. Moderate facet degenerative changes seen throughout the lumbar spine, greatest at L5-S1.  IMPRESSION: 1. Multilevel degenerative changes of the lumbar spine, greatest at L5-S1. 2. IVC filter seen in the paraspinal region at L3-L4, now with a fractured limb located more  superiorly at L2-L3. Interventional radiology consultation should be considered for filter extraction.  Electronically Signed   By: Acquanetta Belling M.D.   On: 04/21/2023 08:42 Note: Reviewed        Physical Exam  General appearance: Well nourished, well developed, and well hydrated. In no apparent acute distress Mental status: Alert, oriented x 3 (person, place, & time)       Respiratory: No evidence of acute respiratory distress Eyes: PERLA Vitals: BP (!) 151/86   Pulse 63   Temp (!) 97.2 F (36.2 C) (Temporal)   Resp 16   Wt 167 lb (75.8 kg)   SpO2 98%   BMI 32.61 kg/m  BMI: Estimated body mass index is 32.61 kg/m as calculated from the following:   Height as of 08/14/23: 5' (1.524 m).   Weight as of this encounter: 167 lb (75.8 kg). Ideal: Ideal body weight: 45.5 kg (100 lb 4.9 oz) Adjusted ideal body weight: 57.6 kg (126 lb 15.8 oz)  Assessment   Diagnosis Status  1. Chronic low back pain (1ry area of Pain) (Bilateral) (R>L) w/o sciatica   2. Cervicalgia   3. DDD (degenerative disc disease), lumbar   4. 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 Cervical facet joint syndrome   6. Chronic hip pain (Right)   7. Lumbar facet syndrome (Bilateral) (R>L)   8. Rheumatoid arthritis involving multiple sites with positive rheumatoid factor (HCC)   9. Chronic musculoskeletal pain   10. Chronic pain syndrome   11. Pharmacologic therapy   12. Chronic use of opiate for therapeutic purpose   13. Encounter for medication management   14. Encounter for chronic pain management   15. Lumbar facet hypertrophy   16. Spondylosis without myelopathy or radiculopathy, lumbosacral region   17. Lumbar facet joint pain   18. Chronic shoulder pain (Left)   19. Rheumatoid arthritis of shoulder (HCC) (Left)   20. Decreased range of motion of shoulder (Left)    Controlled Controlled Controlled   Updated Problems: No problems updated.   Plan of Care   Problem-specific:  No problem-specific Assessment & Plan notes found for this encounter.  Ms. ERMEL VANBEEK has a current medication list which includes the following long-term medication(s): albuterol, atenolol, duloxetine, famotidine, ferosul, furosemide, [START ON 10/08/2023] hydrocodone-acetaminophen, [START ON 11/07/2023] hydrocodone-acetaminophen, [START ON 12/07/2023] hydrocodone-acetaminophen, montelukast, pantoprazole, potassium chloride sa, pravastatin, symbicort, and spironolactone.  Pharmacotherapy (Medications Ordered): Meds ordered this encounter  Medications   HYDROcodone-acetaminophen (NORCO) 10-325 MG tablet    Sig: Take 1 tablet by mouth every 8 (eight) hours. Must last 30 days    Dispense:  90 tablet    Refill:  0    DO NOT: delete (not duplicate); no partial-fill (will deny script  to complete), no refill request (F/U required). DISPENSE: 1 day early if closed on fill date. WARN: No CNS-depressants within 8 hrs of med.   HYDROcodone-acetaminophen (NORCO) 10-325 MG tablet    Sig: Take 1 tablet by mouth every 8 (eight) hours. Must last 30 days    Dispense:  90 tablet    Refill:  0    DO NOT: delete (not duplicate); no partial-fill (will deny script to complete), no refill request (F/U required). DISPENSE: 1 day early if closed on fill date. WARN: No CNS-depressants within 8 hrs of med.   HYDROcodone-acetaminophen (NORCO) 10-325 MG tablet    Sig: Take 1 tablet by mouth every 8 (eight) hours. Must last 30 days    Dispense:  90 tablet    Refill:  0    DO NOT: delete (not duplicate); no partial-fill (will deny script to complete), no refill request (F/U required). DISPENSE: 1 day early if closed on fill date. WARN: No CNS-depressants within 8 hrs of med.   Orders:  Orders Placed This Encounter  Procedures   SHOULDER INJECTION    Standing Status:   Future    Standing Expiration Date:   01/02/2024    Scheduling Instructions:     Procedure: Intra-articular shoulder  (Glenohumeral) joint and (AC) Acromioclavicular joint injection     Side: Left-sided     Level: Glenohumeral joint and (AC) Acromioclavicular joint     Sedation: Patient's choice.     Timeframe: As permitted by the schedule    Order Specific Question:   Where will this procedure be performed?    Answer:   [facility]   DG Shoulder Left    Please make sure that the patient understands that this needs to be done as soon as possible. Never have the patient do the imaging "just before the next appointment". Inform patient that having the imaging done within the [facility] will expedite the availability of the results and will provide imaging availability to the requesting physician. In addition inform the patient that the imaging order has an expiration date and will not be renewed if not done within the active period.    Standing Status:   Future    Standing Expiration Date:   01/02/2024    Scheduling Instructions:     Imaging must be done as soon as possible. Inform patient that order will expire within 30 days and I will not renew it.    Order Specific Question:   Reason for Exam (SYMPTOM  OR DIAGNOSIS REQUIRED)    Answer:   Left shoulder pain    Order Specific Question:   Preferred imaging location?    Answer:   [facility]    Order Specific Question:   Call Results- Best Contact Number?    Answer:   [phone] [facility]    Order Specific Question:   Release to patient    Answer:   Immediate   Follow-up plan:   Return for [facility]): (L) Shoulder inj..      Interventional Therapies  Risk  Complexity Considerations:   Allergy: CONTRAST  COPD  BA  CKD  SOBOE  GERD  Jehovah's witness  Hx. intestinal obstruction  CHF  HTN  DM  MO (BMI>30)  No RFA until patient brings BMI down to less than 30 kg/m   Planned  Pending:   Diagnostic left IA shoulder and AC joint injection #1    Under consideration:  Therapeutic bilateral lumbar facet MBB #4  Possible bilateral lumbar facet RFA #1 (on hold until BMI is brought down to 30 kg/m)   Completed:   Diagnostic bilateral lumbar facet MBB x3 (01/18/2022) (5/10 to 0/10) (100/100/75/75-100)    Therapeutic  Palliative (PRN) options:   Therapeutic/palliative bilateral lumbar facet MBB   Pharmacotherapy  Nonopioids transferred 11/09/2020: Magnesium, Lyrica, and Mobic.       Recent Visits Date Type Provider Dept  10/02/23 Office Visit Delano Metz, MD [facility]  07/10/23 Office Visit Delano Metz, MD [facility]  Showing recent visits within past 90 days and meeting all other requirements Future Appointments Date Type Provider Dept  10/08/23 Appointment Delano Metz, MD [facility]  12/30/23 Appointment Delano Metz, MD [facility]  Showing future appointments within next 90 days and meeting all other requirements  I discussed the assessment and treatment plan with the patient. The patient was provided an opportunity to ask questions and all were answered. The patient agreed with the plan and demonstrated an understanding of the instructions.  Patient advised to call back or seek an in-person evaluation if the symptoms or condition worsens.  Duration of encounter: 30 minutes.  Total time on encounter, as per AMA guidelines included both the face-to-face and non-face-to-face time personally spent by the physician and/or other qualified health care professional(s) on the day of the encounter (includes time in activities that require the physician or other qualified health care professional and does not include time in activities normally performed by clinical staff). Physician's time may include the following activities when performed: Preparing to see the patient (e.g., pre-charting review of records, searching for previously ordered imaging, lab work, and nerve conduction  tests) Review of prior analgesic pharmacotherapies. Reviewing PMP Interpreting ordered tests (e.g., lab work, imaging, nerve conduction tests) Performing post-procedure evaluations, including interpretation of diagnostic procedures Obtaining and/or reviewing separately obtained history Performing a medically appropriate examination and/or evaluation Counseling and educating the patient/family/caregiver Ordering medications, tests, or procedures Referring and communicating with other health care professionals (when not separately reported) Documenting clinical information in the electronic or other health record Independently interpreting results (not separately reported) and communicating results to the patient/ family/caregiver Care coordination (not separately reported)  Note by: Oswaldo Done, MD Date: 10/02/2023; Time: 5:22 AM

## 2023-10-02 NOTE — Progress Notes (Signed)
Nursing Pain Medication Assessment:  Safety precautions to be maintained throughout the outpatient stay will include: orient to surroundings, keep bed in low position, maintain call bell within reach at all times, provide assistance with transfer out of bed and ambulation.  Medication Inspection Compliance: Pill count conducted under aseptic conditions, in front of the patient. Neither the pills nor the bottle was removed from the patient's sight at any time. Once count was completed pills were immediately returned to the patient in their original bottle.  Medication: Hydrocodone/APAP Pill/Patch Count:  13 of 90 pills remain Pill/Patch Appearance: Markings consistent with prescribed medication Bottle Appearance: Standard pharmacy container. Clearly labeled. Filled Date: 93 / 21 / 2024 Last Medication intake:  Today

## 2023-10-02 NOTE — Patient Instructions (Addendum)
Preparing for your procedure (without sedation) Instructions: Oral Intake: Do not eat or drink anything for at least 3 hours prior to your procedure. Transportation: Unless otherwise stated by your physician, you may drive yourself after the procedure. Blood Pressure Medicine: Take your blood pressure medicine with a sip of water the morning of the procedure. Insulin: Take only  of your normal insulin dose. Preventing infections: Shower with an antibacterial soap the morning of your procedure. Build-up your immune system: Take 1000 mg of Vitamin C with every meal (3 times a day) the day prior to your procedure. Pregnancy: If you are pregnant, call and cancel the procedure. Sickness: If you have a cold, fever, or any active infections, call and cancel the procedure. Arrival: You must be in the facility at least 30 minutes prior to your scheduled procedure. Children: Do not bring any children with you. Dress appropriately: Bring dark clothing that you would not mind if they get stained. Valuables: Do not bring any jewelry or valuables. Procedure appointments are reserved for interventional treatments only. No Prescription Refills. No medication changes will be discussed during procedure appointments. No disability issues will be discussed. Fill dates 10-08-23 11-07-23 12-07-23 ______________________________________________________________________    Procedure instructions  Stop blood-thinners  Do not eat or drink fluids (other than water) for 6 hours before your procedure  No water for 2 hours before your procedure  Take your blood pressure medicine with a sip of water  Arrive 30 minutes before your appointment  If sedation is planned, bring suitable driver. Pennie Banter, Benedetto Goad, & public transportation are NOT APPROVED)  Carefully read the "Preparing for your procedure" detailed instructions  If you have questions call us at [phone]  [phone]  ______________________________________________________________________      ______________________________________________________________________    Preparing for your procedure  Appointments: If you think you may not be able to keep your appointment, call 24-48 hours in advance to cancel. We need time to make it available to others.  During your procedure appointment there will be: No Prescription Refills. No disability issues to discussed. No medication changes or discussions.  Instructions: Food intake: Avoid eating anything solid for at least 8 hours prior to your procedure. Clear liquid intake: You may take clear liquids such as water up to 2 hours prior to your procedure. (No carbonated drinks. No soda.) Transportation: Unless otherwise stated by your physician, bring a driver. (Driver cannot be a Market researcher, Pharmacist, community, or any other form of public transportation.) Morning Medicines: Except for blood thinners, take all of your other morning medications with a sip of water. Make sure to take your heart and blood pressure medicines. If your blood pressure's lower number is above 100, the case will be rescheduled. Blood thinners: Make sure to stop your blood thinners as instructed.  If you take a blood thinner, but were not instructed to stop it, call our office [phone] and ask to talk to a nurse. Not stopping a blood thinner prior to certain procedures could lead to serious complications. Diabetics on insulin: Notify the staff so that you can be scheduled 1st case in the morning. If your diabetes requires high dose insulin, take only  of your normal insulin dose the morning of the procedure and notify the staff that you have done so. Preventing infections: Shower with an antibacterial soap the morning of your procedure.  Build-up your immune system: Take 1000 mg of Vitamin C with every meal (3 times a day) the day prior to your procedure. Antibiotics: Inform the  nursing staff if  you are taking any antibiotics or if you have any conditions that may require antibiotics prior to procedures. (Example: recent joint implants)   Pregnancy: If you are pregnant make sure to notify the nursing staff. Not doing so may result in injury to the fetus, including death.  Sickness: If you have a cold, fever, or any active infections, call and cancel or reschedule your procedure. Receiving steroids while having an infection may result in complications. Arrival: You must be in the facility at least 30 minutes prior to your scheduled procedure. Tardiness: Your scheduled time is also the cutoff time. If you do not arrive at least 15 minutes prior to your procedure, you will be rescheduled.  Children: Do not bring any children with you. Make arrangements to keep them home. Dress appropriately: There is always a possibility that your clothing may get soiled. Avoid long dresses. Valuables: Do not bring any jewelry or valuables.  Reasons to call and reschedule or cancel your procedure: (Following these recommendations will minimize the risk of a serious complication.) Surgeries: Avoid having procedures within 2 weeks of any surgery. (Avoid for 2 weeks before or after any surgery). Flu Shots: Avoid having procedures within 2 weeks of a flu shots or . (Avoid for 2 weeks before or after immunizations). Barium: Avoid having a procedure within 7-10 days after having had a radiological study involving the use of radiological contrast. (Myelograms, Barium swallow or enema study). Heart attacks: Avoid any elective procedures or surgeries for the initial 6 months after a "Myocardial Infarction" (Heart Attack). Blood thinners: It is imperative that you stop these medications before procedures. Let us know if you if you take any blood thinner.  Infection: Avoid procedures during or within two weeks of an infection (including chest colds or gastrointestinal problems). Symptoms associated with infections include:  Localized redness, fever, chills, night sweats or profuse sweating, burning sensation when voiding, cough, congestion, stuffiness, runny nose, sore throat, diarrhea, nausea, vomiting, cold or Flu symptoms, recent or current infections. It is specially important if the infection is over the area that we intend to treat. Heart and lung problems: Symptoms that may suggest an active cardiopulmonary problem include: cough, chest pain, breathing difficulties or shortness of breath, dizziness, ankle swelling, uncontrolled high or unusually low blood pressure, and/or palpitations. If you are experiencing any of these symptoms, cancel your procedure and contact your primary care physician for an evaluation.  Remember:  Regular Business hours are:  Monday to Thursday 8:00 AM to 4:00 PM  Provider's Schedule: Delano Metz, MD:  Procedure days: Tuesday and Thursday 7:30 AM to 4:00 PM  Edward Jolly, MD:  Procedure days: Monday and Wednesday 7:30 AM to 4:00 PM Last  Updated: 07/09/2023 ______________________________________________________________________      ______________________________________________________________________    General Risks and Possible Complications  Patient Responsibilities: It is important that you read this as it is part of your informed consent. It is our duty to inform you of the risks and possible complications associated with treatments offered to you. It is your responsibility as a patient to read this and to ask questions about anything that is not clear or that you believe was not covered in this document.  Patient's Rights: You have the right to refuse treatment. You also have the right to change your mind, even after initially having agreed to have the treatment done. However, under this last option, if you wait until the last second to change your mind, you may be charged  for the materials used up to that point.  Introduction: Medicine is not an Visual merchandiser.  Everything in Medicine, including the lack of treatment(s), carries the potential for danger, harm, or loss (which is by definition: Risk). In Medicine, a complication is a secondary problem, condition, or disease that can aggravate an already existing one. All treatments carry the risk of possible complications. The fact that a side effects or complications occurs, does not imply that the treatment was conducted incorrectly. It must be clearly understood that these can happen even when everything is done following the highest safety standards.  No treatment: You can choose not to proceed with the proposed treatment alternative. The "PRO(s)" would include: avoiding the risk of complications associated with the therapy. The "CON(s)" would include: not getting any of the treatment benefits. These benefits fall under one of three categories: diagnostic; therapeutic; and/or palliative. Diagnostic benefits include: getting information which can ultimately lead to improvement of the disease or symptom(s). Therapeutic benefits are those associated with the successful treatment of the disease. Finally, palliative benefits are those related to the decrease of the primary symptoms, without necessarily curing the condition (example: decreasing the pain from a flare-up of a chronic condition, such as incurable terminal cancer).  General Risks and Complications: These are associated to most interventional treatments. They can occur alone, or in combination. They fall under one of the following six (6) categories: no benefit or worsening of symptoms; bleeding; infection; nerve damage; allergic reactions; and/or death. No benefits or worsening of symptoms: In Medicine there are no guarantees, only probabilities. No healthcare provider can ever guarantee that a medical treatment will work, they can only state the probability that it may. Furthermore, there is always the possibility that the condition may worsen, either  directly, or indirectly, as a consequence of the treatment. Bleeding: This is more common if the patient is taking a blood thinner, either prescription or over the counter (example: Goody Powders, Fish oil, Aspirin, Garlic, etc.), or if suffering a condition associated with impaired coagulation (example: Hemophilia, cirrhosis of the liver, low platelet counts, etc.). However, even if you do not have one on these, it can still happen. If you have any of these conditions, or take one of these drugs, make sure to notify your treating physician. Infection: This is more common in patients with a compromised immune system, either due to disease (example: diabetes, cancer, human immunodeficiency virus [HIV], etc.), or due to medications or treatments (example: therapies used to treat cancer and rheumatological diseases). However, even if you do not have one on these, it can still happen. If you have any of these conditions, or take one of these drugs, make sure to notify your treating physician. Nerve Damage: This is more common when the treatment is an invasive one, but it can also happen with the use of medications, such as those used in the treatment of cancer. The damage can occur to small secondary nerves, or to large primary ones, such as those in the spinal cord and brain. This damage may be temporary or permanent and it may lead to impairments that can range from temporary numbness to permanent paralysis and/or brain death. Allergic Reactions: Any time a substance or material comes in contact with our body, there is the possibility of an allergic reaction. These can range from a mild skin rash (contact dermatitis) to a severe systemic reaction (anaphylactic reaction), which can result in death. Death: In general, any medical intervention can result in death, most  of the time due to an unforeseen complication. ______________________________________________________________________       ______________________________________________________________________    Opioid Pain Medication Update  To: All patients taking opioid pain medications. (I.e.: hydrocodone, hydromorphone, oxycodone, oxymorphone, morphine, codeine, methadone, tapentadol, tramadol, buprenorphine, fentanyl, etc.)  Re: Updated review of side effects and adverse reactions of opioid analgesics, as well as new information about long term effects of this class of medications.  Direct risks of long-term opioid therapy are not limited to opioid addiction and overdose. Potential medical risks include serious fractures, breathing problems during sleep, hyperalgesia, immunosuppression, chronic constipation, bowel obstruction, myocardial infarction, and tooth decay secondary to xerostomia.  Unpredictable adverse effects that can occur even if you take your medication correctly: Cognitive impairment, respiratory depression, and death. Most people think that if they take their medication "correctly", and "as instructed", that they will be safe. Nothing could be farther from the truth. In reality, a significant amount of recorded deaths associated with the use of opioids has occurred in individuals that had taken the medication for a long time, and were taking their medication correctly. The following are examples of how this can happen: Patient taking his/her medication for a long time, as instructed, without any side effects, is given a certain antibiotic or another unrelated medication, which in turn triggers a "Drug-to-drug interaction" leading to disorientation, cognitive impairment, impaired reflexes, respiratory depression or an untoward event leading to serious bodily harm or injury, including death.  Patient taking his/her medication for a long time, as instructed, without any side effects, develops an acute impairment of liver and/or kidney function. This will lead to a rapid inability of the body to breakdown and eliminate  their pain medication, which will result in effects similar to an "overdose", but with the same medicine and dose that they had always taken. This again may lead to disorientation, cognitive impairment, impaired reflexes, respiratory depression or an untoward event leading to serious bodily harm or injury, including death.  A similar problem will occur with patients as they grow older and their liver and kidney function begins to decrease as part of the aging process.  Background information: Historically, the original case for using long-term opioid therapy to treat chronic noncancer pain was based on safety assumptions that subsequent experience has called into question. In 1996, the American Pain Society and the American Academy of Pain Medicine issued a consensus statement supporting long-term opioid therapy. This statement acknowledged the dangers of opioid prescribing but concluded that the risk for addiction was low; respiratory depression induced by opioids was short-lived, occurred mainly in opioid-naive patients, and was antagonized by pain; tolerance was not a common problem; and efforts to control diversion should not constrain opioid prescribing. This has now proven to be wrong. Experience regarding the risks for opioid addiction, misuse, and overdose in community practice has failed to support these assumptions.  According to the Centers for Disease Control and Prevention, fatal overdoses involving opioid analgesics have increased sharply over the past decade. Currently, more than 96,700 people die from drug overdoses every year. Opioids are a factor in 7 out of every 10 overdose deaths. Deaths from drug overdose have surpassed motor vehicle accidents as the leading cause of death for individuals between the ages of 28 and 38.  Clinical data suggest that neuroendocrine dysfunction may be very common in both men and women, potentially causing hypogonadism, erectile dysfunction, infertility,  decreased libido, osteoporosis, and depression. Recent studies linked higher opioid dose to increased opioid-related mortality. Controlled observational studies  reported that long-term opioid therapy may be associated with increased risk for cardiovascular events. Subsequent meta-analysis concluded that the safety of long-term opioid therapy in elderly patients has not been proven.   Side Effects and adverse reactions: Common side effects: Drowsiness (sedation). Dizziness. Nausea and vomiting. Constipation. Physical dependence -- Dependence often manifests with withdrawal symptoms when opioids are discontinued or decreased. Tolerance -- As you take repeated doses of opioids, you require increased medication to experience the same effect of pain relief. Respiratory depression -- This can occur in healthy people, especially with higher doses. However, people with COPD, asthma or other lung conditions may be even more susceptible to fatal respiratory impairment.  Uncommon side effects: An increased sensitivity to feeling pain and extreme response to pain (hyperalgesia). Chronic use of opioids can lead to this. Delayed gastric emptying (the process by which the contents of your stomach are moved into your small intestine). Muscle rigidity. Immune system and hormonal dysfunction. Quick, involuntary muscle jerks (myoclonus). Arrhythmia. Itchy skin (pruritus). Dry mouth (xerostomia).  Long-term side effects: Chronic constipation. Sleep-disordered breathing (SDB). Increased risk of bone fractures. Hypothalamic-pituitary-adrenal dysregulation. Increased risk of overdose.  RISKS: Respiratory depression and death: Opioids increase the risk of respiratory depression and death.  Drug-to-drug interactions: Opioids are relatively contraindicated in combination with benzodiazepines, sleep inducers, and other central nervous system depressants. Other classes of medications (i.e.: certain antibiotics  and even over-the-counter medications) may also trigger or induce respiratory depression in some patients.  Medical conditions: Patients with pre-existing respiratory problems are at higher risk of respiratory failure and/or depression when in combination with opioid analgesics. Opioids are relatively contraindicated in some medical conditions such as central sleep apnea.   Fractures and Falls:  Opioids increase the risk and incidence of falls. This is of particular importance in elderly patients.  Endocrine System:  Long-term administration is associated with endocrine abnormalities (endocrinopathies). (Also known as Opioid-induced Endocrinopathy) Influences on both the hypothalamic-pituitary-adrenal axis?and the hypothalamic-pituitary-gonadal axis have been demonstrated with consequent hypogonadism and adrenal insufficiency in both sexes. Hypogonadism and decreased levels of dehydroepiandrosterone sulfate have been reported in men and women. Endocrine effects include: Amenorrhoea in women (abnormal absence of menstruation) Reduced libido in both sexes Decreased sexual function Erectile dysfunction in men Hypogonadisms (decreased testicular function with shrinkage of testicles) Infertility Depression and fatigue Loss of muscle mass Anxiety Depression Immune suppression Hyperalgesia Weight gain Anemia Osteoporosis Patients (particularly women of childbearing age) should avoid opioids. There is insufficient evidence to recommend routine monitoring of asymptomatic patients taking opioids in the long-term for hormonal deficiencies.  Immune System: Human studies have demonstrated that opioids have an immunomodulating effect. These effects are mediated via opioid receptors both on immune effector cells and in the central nervous system. Opioids have been demonstrated to have adverse effects on antimicrobial response and anti-tumour surveillance. Buprenorphine has been demonstrated to have  no impact on immune function.  Opioid Induced Hyperalgesia: Human studies have demonstrated that prolonged use of opioids can lead to a state of abnormal pain sensitivity, sometimes called opioid induced hyperalgesia (OIH). Opioid induced hyperalgesia is not usually seen in the absence of tolerance to opioid analgesia. Clinically, hyperalgesia may be diagnosed if the patient on long-term opioid therapy presents with increased pain. This might be qualitatively and anatomically distinct from pain related to disease progression or to breakthrough pain resulting from development of opioid tolerance. Pain associated with hyperalgesia tends to be more diffuse than the pre-existing pain and less defined in quality. Management of opioid induced hyperalgesia requires  opioid dose reduction.  Cancer: Chronic opioid therapy has been associated with an increased risk of cancer among noncancer patients with chronic pain. This association was more evident in chronic strong opioid users. Chronic opioid consumption causes significant pathological changes in the small intestine and colon. Epidemiological studies have found that there is a link between opium dependence and initiation of gastrointestinal cancers. Cancer is the second leading cause of death after cardiovascular disease. Chronic use of opioids can cause multiple conditions such as GERD, immunosuppression and renal damage as well as carcinogenic effects, which are associated with the incidence of cancers.   Mortality: Long-term opioid use has been associated with increased mortality among patients with chronic non-cancer pain (CNCP).  Prescription of long-acting opioids for chronic noncancer pain was associated with a significantly increased risk of all-cause mortality, including deaths from causes other than overdose.  Reference: Von Korff M, Kolodny A, Deyo RA, Chou R. Long-term opioid therapy reconsidered. Ann Intern Med. 2011 Sep 6;155(5):325-8. doi:  10.7326/[id number]. PMID: 16109604; PMCID: [id number]. Randon Goldsmith, Hayward RA, Dunn KM, [place] KP. Risk of adverse events in patients prescribed long-term opioids: A cohort study in the [place] [facility] [employer]. Eur J Pain. 2019 May;23(5):908-922. doi: 10.1002/ejp.1357. Epub 2019 Jan 31. PMID: 78295621. Colameco S, Coren JS, Ciervo CA. Continuous opioid treatment for chronic noncancer pain: a time for moderation in prescribing. Postgrad Med. 2009 Jul;121(4):61-6. doi: 10.3810/pgm.2009.07.2032. PMID: 30865784. William Hamburger RN, [place] SD, Blazina I, Cristopher [place], Bougatsos C, Deyo RA. The effectiveness and risks of long-term opioid therapy for chronic pain: a systematic review for a [employer] to [employer]. Ann Intern Med. 2015 Feb 17;162(4):276-86. doi: 10.7326/M14-2559. PMID: 69629528. Caryl Bis [facility], [place] DM. NCHS Data Brief No. 22. [place]: Centers for Disease Control and Prevention; 2009. Sep, Increase in Fatal Poisonings Involving Opioid Analgesics in the [place], 1999-2006. Song IA, Choi HR, Oh TK. Long-term opioid use and mortality in patients with chronic non-cancer pain: Ten-year follow-up study in [place] from 2010 through 2019. EClinicalMedicine. 2022 Jul 18;51:101558. doi: 10.1016/j.eclinm.2022.413244. PMID: 01027253; PMCID: [id number]. Huser, W., Schubert, T., Vogelmann, T. et al. All-cause mortality in patients with long-term opioid therapy compared with non-opioid analgesics for chronic non-cancer pain: a database study. BMC Med 18, 162 (2020). http://lester.info/ Rashidian H, Karie Kirks, Malekzadeh R, Haghdoost AA. An Ecological Study of the Association between Opiate Use and Incidence of Cancers. Addict Health. 2016 Fall;8(4):252-260. PMID: 59563875; PMCID: [id number].  Our Goal: Our goal is to control your pain with means other  than the use of opioid pain medications.  Our Recommendation: Talk to your physician about coming off of these medications. We can assist you with the tapering down and stopping these medicines. Based on the new information, even if you cannot completely stop the medication, a decrease in the dose may be associated with a lesser risk. Ask for other means of controlling the pain. Decrease or eliminate those factors that significantly contribute to your pain such as smoking, obesity, and a diet heavily tilted towards "inflammatory" nutrients.  Last Updated: 05/27/2023   ______________________________________________________________________       ______________________________________________________________________    National Pain Medication Shortage  The [place] is experiencing worsening drug shortages. These have had a negative widespread effect on patient care and treatment. Not expected to improve any time soon. Predicted to last past 2029.   Drug shortage list (generic names) Oxycodone IR Oxycodone/APAP Oxymorphone IR Hydromorphone Hydrocodone/APAP  Morphine  Where is the problem?  Manufacturing and supply level.  Will this shortage affect you?  Only if you take any of the above pain medications.  How? You may be unable to fill your prescription.  Your pharmacist may offer a "partial fill" of your prescription. (Warning: Do not accept partial fills.) Prescriptions partially filled cannot be transferred to another pharmacy. Read our Medication Rules and Regulation. Depending on how much medicine you are dependent on, you may experience withdrawals when unable to get the medication.  Recommendations: Consider ending your dependence on opioid pain medications. Ask your pain specialist to assist you with the process. Consider switching to a medication currently not in shortage, such as Buprenorphine. Talk to your pain specialist about this option. Consider decreasing your pain  medication requirements by managing tolerance thru "Drug Holidays". This may help minimize withdrawals, should you run out of medicine. Control your pain thru the use of non-pharmacological interventional therapies.   Your prescriber: Prescribers cannot be blamed for shortages. Medication manufacturing and supply issues cannot be fixed by the prescriber.   NOTE: The prescriber is not responsible for supplying the medication, or solving supply issues. Work with your pharmacist to solve it. The patient is responsible for the decision to take or continue taking the medication and for identifying and securing a legal supply source. By law, supplying the medication is the job and responsibility of the pharmacy. The prescriber is responsible for the evaluation, monitoring, and prescribing of these medications.   Prescribers will NOT: Re-issue prescriptions that have been partially filled. Re-issue prescriptions already sent to a pharmacy.  Re-send prescriptions to a different pharmacy because yours did not have your medication. Ask pharmacist to order more medicine or transfer the prescription to another pharmacy. (Read below.)  New 2023 regulation: "July 20, 2022 Revised Regulation Allows DEA-Registered Pharmacies to Transfer Electronic Prescriptions at a Patient's Request DEA Headquarters Division - Public Information Office Patients now have the ability to request their electronic prescription be transferred to another pharmacy without having to go back to their practitioner to initiate the request. This revised regulation went into effect on Monday, July 16, 2022.     At a patient's request, a DEA-registered retail pharmacy can now transfer an electronic prescription for a controlled substance (schedules II-V) to another DEA-registered retail pharmacy. Prior to this change, patients would have to go through their practitioner to cancel their prescription and have it re-issued to a different  pharmacy. The process was taxing and time consuming for both patients and practitioners.    The Drug Enforcement Administration [facility]) published its intent to revise the process for transferring electronic prescriptions on October 07, 2020.  The final rule was published in the federal register on June 14, 2022 and went into effect 30 days later.  Under the final rule, a prescription can only be transferred once between pharmacies, and only if allowed under existing state or other applicable law. The prescription must remain in its electronic form; may not be altered in any way; and the transfer must be communicated directly between two licensed pharmacists. It's important to note, any authorized refills transfer with the original prescription, which means the entire prescription will be filled at the same pharmacy".  Reference: [email or web address] [facility] [place] website announcement)  [email or web address] [employer])   [employer] / Vol. 88, No. 143 / Thursday, June 14, 2022 / Rules and Regulations DEPARTMENT OF JUSTICE  Drug Enforcement Administration  21 CFR Part 1306  [employer]  No. DEA-637]  RIN 1117-AB64 Transfer of Electronic Prescriptions for Schedules II-V Controlled Substances Between Pharmacies for Initial Filling  ______________________________________________________________________       ______________________________________________________________________    Transfer of Pain Medication between Pharmacies  Re: 2023 DEA Clarification on existing regulation  Published on DEA Website: July 20, 2022  Title: Revised Regulation Allows DEA-Registered Pharmacies to Electrical engineer Prescriptions at a Patient's Request DEA Headquarters Division - [employer]  "Patients now have the ability to  request their electronic prescription be transferred to another pharmacy without having to go back to their practitioner to initiate the request. This revised regulation went into effect on Monday, July 16, 2022.     At a patient's request, a DEA-registered retail pharmacy can now transfer an electronic prescription for a controlled substance (schedules II-V) to another DEA-registered retail pharmacy. Prior to this change, patients would have to go through their practitioner to cancel their prescription and have it re-issued to a different pharmacy. The process was taxing and time consuming for both patients and practitioners.    The Drug Enforcement Administration [facility]) published its intent to revise the process for transferring electronic prescriptions on October 07, 2020.  The final rule was published in the federal register on June 14, 2022 and went into effect 30 days later.  Under the final rule, a prescription can only be transferred once between pharmacies, and only if allowed under existing state or other applicable law. The prescription must remain in its electronic form; may not be altered in any way; and the transfer must be communicated directly between two licensed pharmacists. It's important to note, any authorized refills transfer with the original prescription, which means the entire prescription will be filled at the same pharmacy."    REFERENCES: 1. DEA website announcement [email or web address]  2. Department of Justice website  [email or web address]  3. DEPARTMENT OF JUSTICE Drug Enforcement Administration 21 CFR Part 1306 [Docket No. DEA-637] RIN 1117-AB64 "Transfer of Electronic Prescriptions for Schedules II-V Controlled Substances Between Pharmacies for Initial  Filling"  ______________________________________________________________________       ______________________________________________________________________    Medication Rules  Purpose: To inform patients, and their family members, of our medication rules and regulations.  Applies to: All patients receiving prescriptions from our practice (written or electronic).  Pharmacy of record: This is the pharmacy where your electronic prescriptions will be sent. Make sure we have the correct one.  Electronic prescriptions: In compliance with the [facility] Strengthen Opioid Misuse Prevention (STOP) Act of 2017 (Session Conni Elliot [phone]), effective November 19, 2018, all controlled substances must be electronically prescribed. Written prescriptions, faxing, or calling prescriptions to a pharmacy will no longer be done.  Prescription refills: These will be provided only during in-person appointments. No medications will be renewed without a "face-to-face" evaluation with your provider. Applies to all prescriptions.  NOTE: The following applies primarily to controlled substances (Opioid* Pain Medications).   Type of encounter (visit): For patients receiving controlled substances, face-to-face visits are required. (Not an option and not up to the patient.)  Patient's Responsibilities: Pain Pills: Bring all pain pills to every appointment (except for procedure appointments). Pill counts are required.  Pill Bottles: Bring pills in original pharmacy bottle. Bring bottle, even if empty. Always bring the bottle of the most recent fill.  Medication refills: You are responsible for knowing and keeping track of what medications you are taking and when is it that you will need a refill. The day before your appointment: write a list of all  prescriptions that need to be refilled. The day of the appointment: give the list to the admitting nurse. Prescriptions will be written only during appointments. No  prescriptions will be written on procedure days. If you forget a medication: it will not be "Called in", "Faxed", or "electronically sent". You will need to get another appointment to get these prescribed. No early refills. Do not call asking to have your prescription filled early. Partial  or short prescriptions: Occasionally your pharmacy may not have enough pills to fill your prescription.  NEVER ACCEPT a partial fill or a prescription that is short of the total amount of pills that you were prescribed.  With controlled substances the law allows 72 hours for the pharmacy to complete the prescription.  If the prescription is not completed within 72 hours, the pharmacist will require a new prescription to be written. This means that you will be short on your medicine and we WILL NOT send another prescription to complete your original prescription.  Instead, request the pharmacy to send a carrier to a nearby branch to get enough medication to provide you with your full prescription. Prescription Accuracy: You are responsible for carefully inspecting your prescriptions before leaving our office. Have the discharge nurse carefully go over each prescription with you, before taking them home. Make sure that your name is accurately spelled, that your address is correct. Check the name and dose of your medication to make sure it is accurate. Check the number of pills, and the written instructions to make sure they are clear and accurate. Make sure that you are given enough medication to last until your next medication refill appointment. Taking Medication: Take medication as prescribed. When it comes to controlled substances, taking less pills or less frequently than prescribed is permitted and encouraged. Never take more pills than instructed. Never take the medication more frequently than prescribed.  Inform other Doctors: Always inform, all of your healthcare providers, of all the medications you take. Pain  Medication from other Providers: You are not allowed to accept any additional pain medication from any other Doctor or Healthcare provider. There are two exceptions to this rule. (see below) In the event that you require additional pain medication, you are responsible for notifying us, as stated below. Cough Medicine: Often these contain an opioid, such as codeine or hydrocodone. Never accept or take cough medicine containing these opioids if you are already taking an opioid* medication. The combination may cause respiratory failure and death. Medication Agreement: You are responsible for carefully reading and following our Medication Agreement. This must be signed before receiving any prescriptions from our practice. Safely store a copy of your signed Agreement. Violations to the Agreement will result in no further prescriptions. (Additional copies of our Medication Agreement are available upon request.) Laws, Rules, & Regulations: All patients are expected to follow all [address] and [employer], Rules, [place] [employer]. Ignorance of the Laws does not constitute a valid excuse.  Illegal drugs and Controlled Substances: The use of illegal substances (including, but not limited to marijuana and its derivatives) and/or the illegal use of any controlled substances is strictly prohibited. Violation of this rule may result in the immediate and permanent discontinuation of any and all prescriptions being written by our practice. The use of any illegal substances is prohibited. Adopted CDC guidelines & recommendations: Target dosing levels will be at or below 60 MME/day. Use of benzodiazepines** is not recommended. Urine Drug testing: Patients taking controlled substances will be required to provide  a urine sample upon request. Do not void before coming to your medication management appointments. Hold emptying your bladder until you are admitted. The admitting nurse will inform you if a sample is required. Our  practice reserves the right to call you at any time to provide a sample. Once receiving the call, you have 24 hours to comply with request. Not providing a sample upon request may result in termination of medication therapy.  Exceptions: There are only two exceptions to the rule of not receiving pain medications from other Healthcare Providers. Exception #1 (Emergencies): In the event of an emergency (i.e.: accident requiring emergency care), you are allowed to receive additional pain medication. However, you are responsible for: As soon as you are able, call our office [phone], at any time of the day or night, and leave a message stating your name, the date and nature of the emergency, and the name and dose of the medication prescribed. In the event that your call is answered by a member of our staff, make sure to document and save the date, time, and the name of the person that took your information.  Exception #2 (Planned Surgery): In the event that you are scheduled by another doctor or dentist to have any type of surgery or procedure, you are allowed (for a period no longer than 30 days), to receive additional pain medication, for the acute post-op pain. However, in this case, you are responsible for picking up a copy of our "Post-op Pain Management for Surgeons" handout, and giving it to your surgeon or dentist. This document is available at our office, and does not require an appointment to obtain it. Simply go to our office during business hours (Monday-Thursday from 8:00 AM to 4:00 PM) (Friday 8:00 AM to 12:00 Noon) or if you have a scheduled appointment with [place], prior to your surgery, and ask for it by name. In addition, you are responsible for: calling our office [phone], at any time of the day or night, and leaving a message stating your name, name of your surgeon, type of surgery, and date of procedure or surgery. Failure to comply with your responsibilities may result in termination  of therapy involving the controlled substances.  Consequences:  Non-compliance with the above rules may result in permanent discontinuation of medication prescription therapy. All patients receiving any type of controlled substance is expected to comply with the above patient responsibilities. Not doing so may result in permanent discontinuation of medication prescription therapy. Medication Agreement Violation. Following the above rules, including your responsibilities will help you in avoiding a Medication Agreement Violation ("Breaking your Pain Medication Contract").  *Opioid medications include: morphine, codeine, oxycodone, oxymorphone, hydrocodone, hydromorphone, meperidine, tramadol, tapentadol, buprenorphine, fentanyl, methadone. **Benzodiazepine medications include: diazepam (Valium), alprazolam (Xanax), clonazepam (Klonopine), lorazepam (Ativan), clorazepate (Tranxene), chlordiazepoxide (Librium), estazolam (Prosom), oxazepam (Serax), temazepam (Restoril), triazolam (Halcion) (Last updated: 09/11/2023) ______________________________________________________________________      ______________________________________________________________________    Medication Recommendations and Reminders  Applies to: All patients receiving prescriptions (written and/or electronic).  Medication Rules & Regulations: You are responsible for reading, knowing, and following our "Medication Rules" document. These exist for your safety and that of others. They are not flexible and neither are we. Dismissing or ignoring them is an act of "non-compliance" that may result in complete and irreversible termination of such medication therapy. For safety reasons, "non-compliance" will not be tolerated. As with the [place] fundamental legal principle of "ignorance of the law is no defense", we will accept no  excuses for not having read and knowing the content of documents provided to you by our practice.  Pharmacy of  record:  Definition: This is the pharmacy where your electronic prescriptions will be sent.  We do not endorse any particular pharmacy. It is up to you and your insurance to decide what pharmacy to use.  We do not restrict you in your choice of pharmacy. However, once we write for your prescriptions, we will NOT be re-sending more prescriptions to fix restricted supply problems created by your pharmacy, or your insurance.  The pharmacy listed in the electronic medical record should be the one where you want electronic prescriptions to be sent. If you choose to change pharmacy, simply notify our nursing staff. Changes will be made only during your regular appointments and not over the phone.  Recommendations: Keep all of your pain medications in a safe place, under lock and key, even if you live alone. We will NOT replace lost, stolen, or damaged medication. We do not accept "Police Reports" as proof of medications having been stolen. After you fill your prescription, take 1 week's worth of pills and put them away in a safe place. You should keep a separate, properly labeled bottle for this purpose. The remainder should be kept in the original bottle. Use this as your primary supply, until it runs out. Once it's gone, then you know that you have 1 week's worth of medicine, and it is time to come in for a prescription refill. If you do this correctly, it is unlikely that you will ever run out of medicine. To make sure that the above recommendation works, it is very important that you make sure your medication refill appointments are scheduled at least 1 week before you run out of medicine. To do this in an effective manner, make sure that you do not leave the office without scheduling your next medication management appointment. Always ask the nursing staff to show you in your prescription , when your medication will be running out. Then arrange for the receptionist to get you a return appointment, at least  7 days before you run out of medicine. Do not wait until you have 1 or 2 pills left, to come in. This is very poor planning and does not take into consideration that we may need to cancel appointments due to bad weather, sickness, or emergencies affecting our staff. DO NOT ACCEPT A "Partial Fill": If for any reason your pharmacy does not have enough pills/tablets to completely fill or refill your prescription, do not allow for a "partial fill". The law allows the pharmacy to complete that prescription within 72 hours, without requiring a new prescription. If they do not fill the rest of your prescription within those 72 hours, you will need a separate prescription to fill the remaining amount, which we will NOT provide. If the reason for the partial fill is your insurance, you will need to talk to the pharmacist about payment alternatives for the remaining tablets, but again, DO NOT ACCEPT A PARTIAL FILL, unless you can trust your pharmacist to obtain the remainder of the pills within 72 hours.  Prescription refills and/or changes in medication(s):  Prescription refills, and/or changes in dose or medication, will be conducted only during scheduled medication management appointments. (Applies to both, written and electronic prescriptions.) No refills on procedure days. No medication will be changed or started on procedure days. No changes, adjustments, and/or refills will be conducted on a procedure day. Doing so will  interfere with the diagnostic portion of the procedure. No phone refills. No medications will be "called into the pharmacy". No Fax refills. No weekend refills. No Holliday refills. No after hours refills.  Remember:  Business hours are:  Monday to Thursday 8:00 AM to 4:00 PM Provider's Schedule: Delano Metz, MD - Appointments are:  Medication management: Monday and Wednesday 8:00 AM to 4:00 PM Procedure day: Tuesday and Thursday 7:30 AM to 4:00 PM Edward Jolly, MD -  Appointments are:  Medication management: Tuesday and Thursday 8:00 AM to 4:00 PM Procedure day: Monday and Wednesday 7:30 AM to 4:00 PM (Last update: 09/11/2022) ______________________________________________________________________      ______________________________________________________________________     Naloxone Nasal Spray  Why am I receiving this medication? [place] STOP ACT requires that all patients taking high dose opioids or at risk of opioids respiratory depression, be prescribed an opioid reversal agent, such as Naloxone (AKA: Narcan).  What is this medication? NALOXONE (nal OX one) treats opioid overdose, which causes slow or shallow breathing, severe drowsiness, or trouble staying awake. Call emergency services after using this medication. You may need additional treatment. Naloxone works by reversing the effects of opioids. It belongs to a group of medications called opioid blockers.  COMMON BRAND NAME(S): Kloxxado, Narcan  What should I tell my care team before I take this medication? They need to know if you have any of these conditions: Heart disease Substance use disorder An unusual or allergic reaction to naloxone, other medications, foods, dyes, or preservatives Pregnant or trying to get pregnant Breast-feeding  When to use this medication? This medication is to be used for the treatment of respiratory depression (less than 8 breaths per minute) secondary to opioid overdose.   How to use this medication? This medication is for use in the nose. Lay the person on their back. Support their neck with your hand and allow the head to tilt back before giving the medication. The nasal spray should be given into 1 nostril. After giving the medication, move the person onto their side. Do not remove or test the nasal spray until ready to use. Get emergency medical help right away after giving the first dose of this medication, even if the person wakes up. You  should be familiar with how to recognize the signs and symptoms of a narcotic overdose. If more doses are needed, give the additional dose in the other nostril. Talk to your care team about the use of this medication in children. While this medication may be prescribed for children as young as newborns for selected conditions, precautions do apply.  Naloxone Overdosage: If you think you have taken too much of this medicine contact a poison control center or emergency room at once.  NOTE: This medicine is only for you. Do not share this medicine with others.  What if I miss a dose? This does not apply.  What may interact with this medication? This is only used during an emergency. No interactions are expected during emergency use. This list may not describe all possible interactions. Give your health care provider a list of all the medicines, herbs, non-prescription drugs, or dietary supplements you use. Also tell them if you smoke, drink alcohol, or use illegal drugs. Some items may interact with your medicine.  What should I watch for while using this medication? Keep this medication ready for use in the case of an opioid overdose. Make sure that you have the phone number of your care team and local hospital  ready. You may need to have additional doses of this medication. Each nasal spray contains a single dose. Some emergencies may require additional doses. After use, bring the treated person to the nearest hospital or call 911. Make sure the treating care team knows that the person has received a dose of this medication. You will receive additional instructions on what to do during and after use of this medication before an emergency occurs.  What side effects may I notice from receiving this medication? Side effects that you should report to your care team as soon as possible: Allergic reactions--skin rash, itching, hives, swelling of the face, lips, tongue, or throat Side effects that  usually do not require medical attention (report these to your care team if they continue or are bothersome): Constipation Dryness or irritation inside the nose Headache Increase in blood pressure Muscle spasms Stuffy nose Toothache This list may not describe all possible side effects. Call your doctor for medical advice about side effects. You may report side effects to FDA at 1-800-FDA-1088.  Where should I keep my medication? Because this is an emergency medication, you should keep it with you at all times.  Keep out of the reach of children and pets. Store between 20 and 25 degrees C (68 and 77 degrees F). Do not freeze. Throw away any unused medication after the expiration date. Keep in original box until ready to use.  NOTE: This sheet is a summary. It may not cover all possible information. If you have questions about this medicine, talk to your doctor, pharmacist, or health care provider.   2023 Elsevier/Gold Standard (2021-07-14 00:00:00)  ______________________________________________________________________

## 2023-10-08 ENCOUNTER — Ambulatory Visit
Admission: RE | Admit: 2023-10-08 | Discharge: 2023-10-08 | Disposition: A | Discharge status: Home / Self Care | Payer: Medicare PPO | Source: Ambulatory Visit | Attending: Pain Medicine | Admitting: Pain Medicine

## 2023-10-08 ENCOUNTER — Ambulatory Visit: Payer: Medicare PPO | Attending: Pain Medicine | Admitting: Pain Medicine

## 2023-10-08 ENCOUNTER — Encounter: Payer: Self-pay | Admitting: Pain Medicine

## 2023-10-08 VITALS — BP 172/102 | HR 82 | Temp 97.6°F | Resp 19 | Ht 60.0 in | Wt 167.0 lb

## 2023-10-08 DIAGNOSIS — M4317 Spondylolisthesis, lumbosacral region: Secondary | ICD-10-CM | POA: Diagnosis present

## 2023-10-08 DIAGNOSIS — M069 Rheumatoid arthritis, unspecified: Secondary | ICD-10-CM | POA: Diagnosis present

## 2023-10-08 DIAGNOSIS — M5459 Other low back pain: Secondary | ICD-10-CM

## 2023-10-08 DIAGNOSIS — R937 Abnormal findings on diagnostic imaging of other parts of musculoskeletal system: Secondary | ICD-10-CM

## 2023-10-08 DIAGNOSIS — M25612 Stiffness of left shoulder, not elsewhere classified: Secondary | ICD-10-CM

## 2023-10-08 DIAGNOSIS — M25512 Pain in left shoulder: Secondary | ICD-10-CM | POA: Insufficient documentation

## 2023-10-08 DIAGNOSIS — G8929 Other chronic pain: Secondary | ICD-10-CM | POA: Diagnosis present

## 2023-10-08 DIAGNOSIS — M47816 Spondylosis without myelopathy or radiculopathy, lumbar region: Secondary | ICD-10-CM

## 2023-10-08 DIAGNOSIS — Z91041 Radiographic dye allergy status: Secondary | ICD-10-CM | POA: Diagnosis present

## 2023-10-08 DIAGNOSIS — M0579 Rheumatoid arthritis with rheumatoid factor of multiple sites without organ or systems involvement: Secondary | ICD-10-CM

## 2023-10-08 DIAGNOSIS — M545 Low back pain, unspecified: Secondary | ICD-10-CM | POA: Diagnosis present

## 2023-10-08 DIAGNOSIS — M5136 Other intervertebral disc degeneration, lumbar region with discogenic back pain only: Secondary | ICD-10-CM

## 2023-10-08 MED ORDER — METHYLPREDNISOLONE ACETATE 80 MG/ML IJ SUSP
80.0000 mg | Freq: Once | INTRAMUSCULAR | Status: AC
  Administered 2023-10-08: 80 mg via INTRA_ARTICULAR

## 2023-10-08 MED ORDER — LIDOCAINE HCL 2 % IJ SOLN
INTRAMUSCULAR | Status: AC
  Filled 2023-10-08: qty 20

## 2023-10-08 MED ORDER — ROPIVACAINE HCL 2 MG/ML IJ SOLN
INTRAMUSCULAR | Status: AC
  Filled 2023-10-08: qty 20

## 2023-10-08 MED ORDER — PENTAFLUOROPROP-TETRAFLUOROETH EX AERO
INHALATION_SPRAY | Freq: Once | CUTANEOUS | Status: AC
  Administered 2023-10-08: 30 via TOPICAL

## 2023-10-08 MED ORDER — METHYLPREDNISOLONE ACETATE 80 MG/ML IJ SUSP
INTRAMUSCULAR | Status: AC
  Filled 2023-10-08: qty 1

## 2023-10-08 MED ORDER — ROPIVACAINE HCL 2 MG/ML IJ SOLN
9.0000 mL | Freq: Once | INTRAMUSCULAR | Status: AC
  Administered 2023-10-08: 9 mL via INTRA_ARTICULAR

## 2023-10-08 MED ORDER — LIDOCAINE HCL 2 % IJ SOLN
20.0000 mL | Freq: Once | INTRAMUSCULAR | Status: AC
  Administered 2023-10-08: 400 mg

## 2023-10-08 NOTE — Progress Notes (Signed)
Safety precautions to be maintained throughout the outpatient stay will include: orient to surroundings, keep bed in low position, maintain call bell within reach at all times, provide assistance with transfer out of bed and ambulation.  

## 2023-10-08 NOTE — Progress Notes (Signed)
PROVIDER NOTE: Interpretation of information contained herein should be left to medically-trained personnel. Specific patient instructions are provided elsewhere under "Patient Instructions" section of medical record. This document was created in part using STT-dictation technology, any transcriptional errors that may result from this process are unintentional.  Patient: Mariah Poole Type: Established DOB: [birth year] MRN: [id number] PCP: Leanna Sato, MD  Service: Procedure DOS: 10/08/2023 Setting: Ambulatory Location: Ambulatory outpatient facility Delivery: Face-to-face Provider: Oswaldo Done, MD Specialty: Interventional Pain Management Specialty designation: 09 Location: Outpatient facility Ref. Prov.: Leanna Sato, MD       Interventional Therapy   Procedure: Glenohumeral and acromioclavicular joint Injection #1  Laterality: Left (-LT)  Level: Shoulder   Imaging: Fluoroscopy-guided Non-spinal ([id number]) Anesthesia: Local anesthesia (1-2% Lidocaine) Anxiolysis: None                 Sedation: No Sedation                       DOS: 10/08/2023  Performed by: Oswaldo Done, MD  Purpose: Diagnostic/Therapeutic Indications: Shoulder pain severe enough to impact quality of life or function. Rationale (medical necessity): procedure needed and proper for the diagnosis and/or treatment of Mariah Poole's medical symptoms and needs. 1. Chronic shoulder pain (Left)   2. Decreased range of motion of shoulder (Left)   3. Rheumatoid arthritis involving multiple sites with positive rheumatoid factor (HCC)   4. Rheumatoid arthritis of shoulder (HCC) (Left)   5. Allergic to radiographic contrast media    NAS-11 Pain score:   Pre-procedure: 3 /10   Post-procedure: 0-No pain/10      Target: Glenohumeral Joint (shoulder) Location: Intra-articular  Region: Entire Shoulder Area Approach: Posterior approach. Type of procedure: Percutaneous joint injection   Position  Prep   Materials:  Position: Prone Prep solution: ChloraPrep (2% chlorhexidine gluconate and 70% isopropyl alcohol) Prep Area: Entire shoulder Area Materials:  Tray: Block Needle(s):  Type: Spinal  Gauge (G): 22  Length: 3.5-in  Qty: 1  H&P (Pre-op Assessment):  Mariah Poole is a 77 y.o. (year old), female patient, seen today for interventional treatment. She  has a past surgical history that includes Abdominal hysterectomy; Cataract extraction w/ intraocular lens implant (Right); Gastric bypass; Hernia repair; Cataract extraction w/PHACO (Left, 07/19/2015); Cholecystectomy; and Hernia repair. Mariah Poole has a current medication list which includes the following prescription(s): albuterol, atenolol, vitamin d3, duloxetine, famotidine, ferosul, fluticasone furoate-vilanterol, furosemide, lidocaine, hydrocodone-acetaminophen, [START ON 11/07/2023] hydrocodone-acetaminophen, [START ON 12/07/2023] hydrocodone-acetaminophen, losartan, magnesium oxide, meloxicam, montelukast, naloxone, ozempic (0.25 or 0.5 mg/dose), pantoprazole, potassium chloride sa, pravastatin, symbicort, turmeric curcumin, ascorbic acid, and spironolactone. Her primarily concern today is the Back Pain and Shoulder Pain (left)  Initial Vital Signs:  Pulse/HCG Rate: 82ECG Heart Rate: 86 Temp: 97.6 F (36.4 C) Resp: 18 BP: (!) 155/114 SpO2: 96 %  BMI: Estimated body mass index is 32.61 kg/m as calculated from the following:   Height as of this encounter: 5' (1.524 m).   Weight as of this encounter: 167 lb (75.8 kg).  Risk Assessment: Allergies: Reviewed. She is allergic to contrast media [iodinated contrast media], ioxaglate, and shellfish allergy.  Allergy Precautions: None required Coagulopathies: Reviewed. None identified.  Blood-thinner therapy: None at this time Active Infection(s): Reviewed. None identified. Mariah Poole is afebrile  Site Confirmation: Mariah Poole was asked to confirm the procedure and laterality before  marking the site Procedure checklist: Completed Consent: Before the procedure and under the influence of no sedative(s), amnesic(s),  or anxiolytics, the patient was informed of the treatment options, risks and possible complications. To fulfill our ethical and legal obligations, as recommended by the American Medical Association's Code of Ethics, I have informed the patient of my clinical impression; the nature and purpose of the treatment or procedure; the risks, benefits, and possible complications of the intervention; the alternatives, including doing nothing; the risk(s) and benefit(s) of the alternative treatment(s) or procedure(s); and the risk(s) and benefit(s) of doing nothing. The patient was provided information about the general risks and possible complications associated with the procedure. These may include, but are not limited to: failure to achieve desired goals, infection, bleeding, organ or nerve damage, allergic reactions, paralysis, and death. In addition, the patient was informed of those risks and complications associated to the procedure, such as failure to decrease pain; infection; bleeding; organ or nerve damage with subsequent damage to sensory, motor, and/or autonomic systems, resulting in permanent pain, numbness, and/or weakness of one or several areas of the body; allergic reactions; (i.e.: anaphylactic reaction); and/or death. Furthermore, the patient was informed of those risks and complications associated with the medications. These include, but are not limited to: allergic reactions (i.e.: anaphylactic or anaphylactoid reaction(s)); adrenal axis suppression; blood sugar elevation that in diabetics may result in ketoacidosis or comma; water retention that in patients with history of congestive heart failure may result in shortness of breath, pulmonary edema, and decompensation with resultant heart failure; weight gain; swelling or edema; medication-induced neural toxicity;  particulate matter embolism and blood vessel occlusion with resultant organ, and/or nervous system infarction; and/or aseptic necrosis of one or more joints. Finally, the patient was informed that Medicine is not an exact science; therefore, there is also the possibility of unforeseen or unpredictable risks and/or possible complications that may result in a catastrophic outcome. The patient indicated having understood very clearly. We have given the patient no guarantees and we have made no promises. Enough time was given to the patient to ask questions, all of which were answered to the patient's satisfaction. Mariah Poole has indicated that she wanted to continue with the procedure. Attestation: I, the ordering provider, attest that I have discussed with the patient the benefits, risks, side-effects, alternatives, likelihood of achieving goals, and potential problems during recovery for the procedure that I have provided informed consent. Date  Time: 10/08/2023 11:25 AM   Imaging Guidance (Non-Spinal):          Type of Imaging Technique: Fluoroscopy Guidance (Non-Spinal) Indication(s): Fluoroscopy guidance for needle placement to enhance accuracy in procedures requiring precise needle localization for targeted delivery of medication in or near specific anatomical locations not easily accessible without such real-time imaging assistance. Exposure Time: Please see nurses notes. Contrast: None used. Fluoroscopic Guidance: I was personally present during the use of fluoroscopy. "Tunnel Vision Technique" used to obtain the best possible view of the target area. Parallax error corrected before commencing the procedure. "Direction-depth-direction" technique used to introduce the needle under continuous pulsed fluoroscopy. Once target was reached, antero-posterior, oblique, and lateral fluoroscopic projection used confirm needle placement in all planes. Images permanently stored in EMR. Interpretation: No  contrast injected. I personally interpreted the imaging intraoperatively. Adequate needle placement confirmed in multiple planes. Permanent images saved into the patient's record.  Pre-Procedure Preparation:  Monitoring: As per clinic protocol. Respiration, ETCO2, SpO2, BP, heart rate and rhythm monitor placed and checked for adequate function Safety Precautions: Patient was assessed for positional comfort and pressure points before starting the procedure. Time-out: I initiated  and conducted the "Time-out" before starting the procedure, as per protocol. The patient was asked to participate by confirming the accuracy of the "Time Out" information. Verification of the correct person, site, and procedure were performed and confirmed by me, the nursing staff, and the patient. "Time-out" conducted as per Joint Commission's Universal Protocol (UP.01.01.01). Time: 1149 Start Time: 1149 hrs.  Description  Narrative of Procedure:          Rationale (medical necessity): procedure needed and proper for the diagnosis and/or treatment of the patient's medical symptoms and needs. Procedural Technique Safety Precautions: Aspiration looking for blood return was conducted prior to all injections. At no point did we inject any substances, as a needle was being advanced. No attempts were made at seeking any paresthesias. Safe injection practices and needle disposal techniques used. Medications properly checked for expiration dates. SDV (single dose vial) medications used. Description of the Procedure: Protocol guidelines were followed. The patient was placed in position over the procedure table. The target area was identified and the area prepped in the usual manner. Skin & deeper tissues infiltrated with local anesthetic. Appropriate amount of time allowed to pass for local anesthetics to take effect. The procedure needles were then advanced to the target area. Proper needle placement secured. Negative aspiration  confirmed. Solution injected in intermittent fashion, asking for systemic symptoms every 0.5cc of injectate. The needles were then removed and the area cleansed, making sure to leave some of the prepping solution back to take advantage of its long term bactericidal properties.             Vitals:   10/08/23 1125 10/08/23 1145 10/08/23 1150 10/08/23 1153  BP: (!) 147/84 (!) 180/96 (!) 167/99 (!) 172/102  Pulse:      Resp:  13 (!) 25 19  Temp:      SpO2:  94% 94% 96%  Weight:      Height:         Start Time: 1149 hrs. End Time: 1152 hrs.  Antibiotic Prophylaxis:   Anti-infectives (From admission, onward)    None      Indication(s): None identified  Post-operative Assessment:  Post-procedure Vital Signs:  Pulse/HCG Rate: 8280 Temp: 97.6 F (36.4 C) Resp: 19 BP: (!) 172/102 SpO2: 96 %  EBL: None  Complications: No immediate post-treatment complications observed by team, or reported by patient.  Note: The patient tolerated the entire procedure well. A repeat set of vitals were taken after the procedure and the patient was kept under observation following institutional policy, for this type of procedure. Post-procedural neurological assessment was performed, showing return to baseline, prior to discharge. The patient was provided with post-procedure discharge instructions, including a section on how to identify potential problems. Should any problems arise concerning this procedure, the patient was given instructions to immediately contact us, at any time, without hesitation. In any case, we plan to contact the patient by telephone for a follow-up status report regarding this interventional procedure.  Comments:  No additional relevant information.  Plan of Care (POC)  Orders:  Orders Placed This Encounter  Procedures   SHOULDER INJECTION    Scheduling Instructions:     Procedure: Intra-articular shoulder (Glenohumeral) joint and (AC) Acromioclavicular joint  injection     Side: Left-sided     Level: Glenohumeral joint and (AC) Acromioclavicular joint     Sedation: Patient's choice.     Timeframe: Today    Order Specific Question:   Where will this procedure be performed?  Answer:   [facility]   DG [facility] C-ARM 1-60 MIN NO REPORT    Intraoperative interpretation by procedural physician at [facility].    Standing Status:   Standing    Number of Occurrences:   1    Order Specific Question:   Reason for exam:    Answer:   Assistance in needle guidance and placement for procedures requiring needle placement in or near specific anatomical locations not easily accessible without such assistance.   Informed Consent Details: Physician/Practitioner Attestation; Transcribe to consent form and obtain patient signature    Note: Always confirm laterality of pain with Mariah Poole, before procedure.    Order Specific Question:   Physician/Practitioner attestation of informed consent for procedure/surgical case    Answer:   I, the physician/practitioner, attest that I have discussed with the patient the benefits, risks, side effects, alternatives, likelihood of achieving goals and potential problems during recovery for the procedure that I have provided informed consent.    Order Specific Question:   Procedure    Answer:   Intra-articular shoulder joint injection under fluoroscopic guidance    Order Specific Question:   Physician/Practitioner performing the procedure    Answer:   Ernan Runkles A. Laban Emperor, MD    Order Specific Question:   Indication/Reason    Answer:   Chronic shoulder pain secondary to shoulder arthropathy   Provide equipment / supplies at bedside    Procedure tray: "Block Tray" (Disposable  single use) Skin infiltration needle: Regular 1.5-in, 25-G, (x1) Block Needle type: Spinal Amount/quantity: 1 Size: Regular (3.5-inch) Gauge: 22G    Standing Status:   Standing    Number of Occurrences:   1    Order Specific  Question:   Specify    Answer:   Block Tray   Chronic Opioid Analgesic:  Hydrocodone/APAP 10/325, 1 tab PO q 8 hrs PRN (30 mg/day of hydrocodone) (975 mg/day of acetaminophen) MME/day: 30 mg/day.   Medications ordered for procedure: Meds ordered this encounter  Medications   lidocaine (XYLOCAINE) 2 % (with pres) injection 400 mg   pentafluoroprop-tetrafluoroeth (GEBAUERS) aerosol   ropivacaine (PF) 2 mg/mL (0.2%) (NAROPIN) injection 9 mL   methylPREDNISolone acetate (DEPO-MEDROL) injection 80 mg   Medications administered: We administered lidocaine, pentafluoroprop-tetrafluoroeth, ropivacaine (PF) 2 mg/mL (0.2%), and methylPREDNISolone acetate.  See the medical record for exact dosing, route, and time of administration.  Follow-up plan:   Return for [facility]): (B) L-FCT Blk & PPE.       Interventional Therapies  Risk  Complexity Considerations:   Allergy: CONTRAST  COPD  BA  CKD  SOBOE  GERD  Jehovah's witness  Hx. intestinal obstruction  CHF  HTN  DM  MO (BMI>30)  No RFA until patient brings BMI down to less than 30 kg/m   Planned  Pending:   Diagnostic left IA shoulder and AC joint injection #1    Under consideration:   Therapeutic bilateral lumbar facet MBB #4  Possible bilateral lumbar facet RFA #1 (on hold until BMI is brought down to 30 kg/m)   Completed:   Diagnostic bilateral lumbar facet MBB x3 (01/18/2022) (5/10 to 0/10) (100/100/75/75-100)    Therapeutic  Palliative (PRN) options:   Therapeutic/palliative bilateral lumbar facet MBB   Pharmacotherapy  Nonopioids transferred 11/09/2020: Magnesium, Lyrica, and Mobic.       Recent Visits Date Type Provider Dept  10/02/23 Office Visit Delano Metz, MD [facility]  07/10/23 Office Visit Delano Metz, MD [facility]  Showing recent visits within past 90 days and meeting all other requirements Today's Visits Date Type Provider Dept  10/08/23 Procedure visit  Delano Metz, MD [facility]  Showing today's visits and meeting all other requirements Future Appointments Date Type Provider Dept  12/30/23 Appointment Delano Metz, MD [facility]  Showing future appointments within next 90 days and meeting all other requirements  Disposition: Discharge home  Discharge (Date  Time): 10/08/2023; 1205 hrs.   Primary Care Physician: Leanna Sato, MD Location: [facility] Note by: Oswaldo Done, MD (TTS technology used. I apologize for any typographical errors that were not detected and corrected.) Date: 10/08/2023; Time: 12:07 PM  Disclaimer:  Medicine is not an Visual merchandiser. The only guarantee in medicine is that nothing is guaranteed. It is important to note that the decision to proceed with this intervention was based on the information collected from the patient. The Data and conclusions were drawn from the patient's questionnaire, the interview, and the physical examination. Because the information was provided in large part by the patient, it cannot be guaranteed that it has not been purposely or unconsciously manipulated. Every effort has been made to obtain as much relevant data as possible for this evaluation. It is important to note that the conclusions that lead to this procedure are derived in large part from the available data. Always take into account that the treatment will also be dependent on availability of resources and existing treatment guidelines, considered by other Pain Management Practitioners as being common knowledge and practice, at the time of the intervention. For Medico-Legal purposes, it is also important to point out that variation in procedural techniques and pharmacological choices are the acceptable norm. The indications, contraindications, technique, and results of the above procedure should only be interpreted and judged by a Board-Certified Interventional  Pain Specialist with extensive familiarity and expertise in the same exact procedure and technique.

## 2023-10-08 NOTE — Patient Instructions (Addendum)
______________________________________________________________________    Post-Procedure Discharge Instructions  Instructions: Apply ice:  Purpose: This will minimize any swelling and discomfort after procedure.  When: Day of procedure, as soon as you get home. How: Fill a plastic sandwich bag with crushed ice. Cover it with a small towel and apply to injection site. How long: (15 min on, 15 min off) Apply for 15 minutes then remove x 15 minutes.  Repeat sequence on day of procedure, until you go to bed. Apply heat:  Purpose: To treat any soreness and discomfort from the procedure. When: Starting the next day after the procedure. How: Apply heat to procedure site starting the day following the procedure. How long: May continue to repeat daily, until discomfort goes away. Food intake: Start with clear liquids (like water) and advance to regular food, as tolerated.  Physical activities: Keep activities to a minimum for the first 8 hours after the procedure. After that, then as tolerated. Driving: If you have received any sedation, be responsible and do not drive. You are not allowed to drive for 24 hours after having sedation. Blood thinner: (Applies only to those taking blood thinners) You may restart your blood thinner 6 hours after your procedure. Insulin: (Applies only to Diabetic patients taking insulin) As soon as you can eat, you may resume your normal dosing schedule. Infection prevention: Keep procedure site clean and dry. Shower daily and clean area with soap and water. Post-procedure Pain Diary: Extremely important that this be done correctly and accurately. Recorded information will be used to determine the next step in treatment. For the purpose of accuracy, follow these rules: Evaluate only the area treated. Do not report or include pain from an untreated area. For the purpose of this evaluation, ignore all other areas of pain, except for the treated area. After your procedure,  avoid taking a long nap and attempting to complete the pain diary after you wake up. Instead, set your alarm clock to go off every hour, on the hour, for the initial 8 hours after the procedure. Document the duration of the numbing medicine, and the relief you are getting from it. Do not go to sleep and attempt to complete it later. It will not be accurate. If you received sedation, it is likely that you were given a medication that may cause amnesia. Because of this, completing the diary at a later time may cause the information to be inaccurate. This information is needed to plan your care. Follow-up appointment: Keep your post-procedure follow-up evaluation appointment after the procedure (usually 2 weeks for most procedures, 6 weeks for radiofrequencies). DO NOT FORGET to bring you pain diary with you.   Expect: (What should I expect to see with my procedure?) From numbing medicine (AKA: Local Anesthetics): Numbness or decrease in pain. You may also experience some weakness, which if present, could last for the duration of the local anesthetic. Onset: Full effect within 15 minutes of injected. Duration: It will depend on the type of local anesthetic used. On the average, 1 to 8 hours.  From steroids (Applies only if steroids were used): Decrease in swelling or inflammation. Once inflammation is improved, relief of the pain will follow. Onset of benefits: Depends on the amount of swelling present. The more swelling, the longer it will take for the benefits to be seen. In some cases, up to 10 days. Duration: Steroids will stay in the system x 2 weeks. Duration of benefits will depend on multiple posibilities including persistent irritating factors. Side-effects: If  present, they may typically last 2 weeks (the duration of the steroids). Frequent: Cramps (if they occur, drink Gatorade and take over-the-counter Magnesium 450-500 mg once to twice a day); water retention with temporary weight gain;  increases in blood sugar; decreased immune system response; increased appetite. Occasional: Facial flushing (red, warm cheeks); mood swings; menstrual changes. Uncommon: Long-term decrease or suppression of natural hormones; bone thinning. (These are more common with higher doses or more frequent use. This is why we prefer that our patients avoid having any injection therapies in other practices.)  Very Rare: Severe mood changes; psychosis; aseptic necrosis. From procedure: Some discomfort is to be expected once the numbing medicine wears off. This should be minimal if ice and heat are applied as instructed.  Call if: (When should I call?) You experience numbness and weakness that gets worse with time, as opposed to wearing off. New onset bowel or bladder incontinence. (Applies only to procedures done in the spine)  Emergency Numbers: Durning business hours (Monday - Thursday, 8:00 AM - 4:00 PM) (Friday, 9:00 AM - 12:00 Noon): [phone] After hours: [phone] NOTE: If you are having a problem and are unable connect with, or to talk to a provider, then go to your nearest urgent care or emergency department. If the problem is serious and urgent, please call 911. ______________________________________________________________________     ______________________________________________________________________    Muscle Spasms & Cramps  Cause(s):  Most common - vitamin and/or electrolyte (calcium, potassium, sodium, etc.) deficiencies. Post procedure - steroids (injected, oral, or inhaled) can make your kidneys excrete (loose) electrolytes. Most of the time this will not cause any symptoms however, if you happen to be borderline low on your electrolytes, it may temporarily triggering cramps & spasms.  Possible triggers: Sweating - causes loss of electrolytes thru the skin. Steroids - causes loss of electrolytes thru the urine.  Treatment: (over-the-counter)  Gatorade (or any other  electrolyte-replenishing drink) - Take 1, 8 oz glass with each meal (3 times a day). Mechanism of action: Replenishes lost electrolytes. Magnesium 400 to 500 mg - Take 1 tablet twice a day (one with breakfast and one at bedtime). If you have kidney disease talk to your primary care physician before taking any Magnesium. Mechanism of action: Magnesium is a natural muscle relaxant. Tonic Water with quinine - Take 1, 8 oz glass before bedtime.  Mechanism of action: Quinine is used to treat spasms.  Last Update: 05/30/2023  ______________________________________________________________________      ______________________________________________________________________    Procedure instructions  Stop blood-thinners  Do not eat or drink fluids (other than water) for 6 hours before your procedure  No water for 2 hours before your procedure  Take your blood pressure medicine with a sip of water  Arrive 30 minutes before your appointment  If sedation is planned, bring suitable driver. Pennie Banter, Benedetto Goad, & public transportation are NOT APPROVED)  Carefully read the "Preparing for your procedure" detailed instructions  If you have questions call us at [phone]  ______________________________________________________________________      ______________________________________________________________________    Preparing for your procedure  Appointments: If you think you may not be able to keep your appointment, call 24-48 hours in advance to cancel. We need time to make it available to others.  During your procedure appointment there will be: No Prescription Refills. No disability issues to discussed. No medication changes or discussions.  Instructions: Food intake: Avoid eating anything solid for at least 8 hours prior to your procedure. Clear liquid intake:  You may take clear liquids such as water up to 2 hours prior to your procedure. (No carbonated drinks. No  soda.) Transportation: Unless otherwise stated by your physician, bring a driver. (Driver cannot be a Market researcher, Pharmacist, community, or any other form of public transportation.) Morning Medicines: Except for blood thinners, take all of your other morning medications with a sip of water. Make sure to take your heart and blood pressure medicines. If your blood pressure's lower number is above 100, the case will be rescheduled. Blood thinners: Make sure to stop your blood thinners as instructed.  If you take a blood thinner, but were not instructed to stop it, call our office [phone] and ask to talk to a nurse. Not stopping a blood thinner prior to certain procedures could lead to serious complications. Diabetics on insulin: Notify the staff so that you can be scheduled 1st case in the morning. If your diabetes requires high dose insulin, take only  of your normal insulin dose the morning of the procedure and notify the staff that you have done so. Preventing infections: Shower with an antibacterial soap the morning of your procedure.  Build-up your immune system: Take 1000 mg of Vitamin C with every meal (3 times a day) the day prior to your procedure. Antibiotics: Inform the nursing staff if you are taking any antibiotics or if you have any conditions that may require antibiotics prior to procedures. (Example: recent joint implants)   Pregnancy: If you are pregnant make sure to notify the nursing staff. Not doing so may result in injury to the fetus, including death.  Sickness: If you have a cold, fever, or any active infections, call and cancel or reschedule your procedure. Receiving steroids while having an infection may result in complications. Arrival: You must be in the facility at least 30 minutes prior to your scheduled procedure. Tardiness: Your scheduled time is also the cutoff time. If you do not arrive at least 15 minutes prior to your procedure, you will be rescheduled.  Children: Do not bring any  children with you. Make arrangements to keep them home. Dress appropriately: There is always a possibility that your clothing may get soiled. Avoid long dresses. Valuables: Do not bring any jewelry or valuables.  Reasons to call and reschedule or cancel your procedure: (Following these recommendations will minimize the risk of a serious complication.) Surgeries: Avoid having procedures within 2 weeks of any surgery. (Avoid for 2 weeks before or after any surgery). Flu Shots: Avoid having procedures within 2 weeks of a flu shots or . (Avoid for 2 weeks before or after immunizations). Barium: Avoid having a procedure within 7-10 days after having had a radiological study involving the use of radiological contrast. (Myelograms, Barium swallow or enema study). Heart attacks: Avoid any elective procedures or surgeries for the initial 6 months after a "Myocardial Infarction" (Heart Attack). Blood thinners: It is imperative that you stop these medications before procedures. Let us know if you if you take any blood thinner.  Infection: Avoid procedures during or within two weeks of an infection (including chest colds or gastrointestinal problems). Symptoms associated with infections include: Localized redness, fever, chills, night sweats or profuse sweating, burning sensation when voiding, cough, congestion, stuffiness, runny nose, sore throat, diarrhea, nausea, vomiting, cold or Flu symptoms, recent or current infections. It is specially important if the infection is over the area that we intend to treat. Heart and lung problems: Symptoms that may suggest an active cardiopulmonary problem include: cough, chest  pain, breathing difficulties or shortness of breath, dizziness, ankle swelling, uncontrolled high or unusually low blood pressure, and/or palpitations. If you are experiencing any of these symptoms, cancel your procedure and contact your primary care physician for an evaluation.  Remember:  Regular  Business hours are:  Monday to Thursday 8:00 AM to 4:00 PM  Provider's Schedule: Delano Metz, MD:  Procedure days: Tuesday and Thursday 7:30 AM to 4:00 PM  Edward Jolly, MD:  Procedure days: Monday and Wednesday 7:30 AM to 4:00 PM Last  Updated: 07/09/2023 ______________________________________________________________________      ______________________________________________________________________    General Risks and Possible Complications  Patient Responsibilities: It is important that you read this as it is part of your informed consent. It is our duty to inform you of the risks and possible complications associated with treatments offered to you. It is your responsibility as a patient to read this and to ask questions about anything that is not clear or that you believe was not covered in this document.  Patient's Rights: You have the right to refuse treatment. You also have the right to change your mind, even after initially having agreed to have the treatment done. However, under this last option, if you wait until the last second to change your mind, you may be charged for the materials used up to that point.  Introduction: Medicine is not an Visual merchandiser. Everything in Medicine, including the lack of treatment(s), carries the potential for danger, harm, or loss (which is by definition: Risk). In Medicine, a complication is a secondary problem, condition, or disease that can aggravate an already existing one. All treatments carry the risk of possible complications. The fact that a side effects or complications occurs, does not imply that the treatment was conducted incorrectly. It must be clearly understood that these can happen even when everything is done following the highest safety standards.  No treatment: You can choose not to proceed with the proposed treatment alternative. The "PRO(s)" would include: avoiding the risk of complications associated with the therapy. The  "CON(s)" would include: not getting any of the treatment benefits. These benefits fall under one of three categories: diagnostic; therapeutic; and/or palliative. Diagnostic benefits include: getting information which can ultimately lead to improvement of the disease or symptom(s). Therapeutic benefits are those associated with the successful treatment of the disease. Finally, palliative benefits are those related to the decrease of the primary symptoms, without necessarily curing the condition (example: decreasing the pain from a flare-up of a chronic condition, such as incurable terminal cancer).  General Risks and Complications: These are associated to most interventional treatments. They can occur alone, or in combination. They fall under one of the following six (6) categories: no benefit or worsening of symptoms; bleeding; infection; nerve damage; allergic reactions; and/or death. No benefits or worsening of symptoms: In Medicine there are no guarantees, only probabilities. No healthcare provider can ever guarantee that a medical treatment will work, they can only state the probability that it may. Furthermore, there is always the possibility that the condition may worsen, either directly, or indirectly, as a consequence of the treatment. Bleeding: This is more common if the patient is taking a blood thinner, either prescription or over the counter (example: Goody Powders, Fish oil, Aspirin, Garlic, etc.), or if suffering a condition associated with impaired coagulation (example: Hemophilia, cirrhosis of the liver, low platelet counts, etc.). However, even if you do not have one on these, it can still happen. If you have any of  these conditions, or take one of these drugs, make sure to notify your treating physician. Infection: This is more common in patients with a compromised immune system, either due to disease (example: diabetes, cancer, human immunodeficiency virus [HIV], etc.), or due to medications  or treatments (example: therapies used to treat cancer and rheumatological diseases). However, even if you do not have one on these, it can still happen. If you have any of these conditions, or take one of these drugs, make sure to notify your treating physician. Nerve Damage: This is more common when the treatment is an invasive one, but it can also happen with the use of medications, such as those used in the treatment of cancer. The damage can occur to small secondary nerves, or to large primary ones, such as those in the spinal cord and brain. This damage may be temporary or permanent and it may lead to impairments that can range from temporary numbness to permanent paralysis and/or brain death. Allergic Reactions: Any time a substance or material comes in contact with our body, there is the possibility of an allergic reaction. These can range from a mild skin rash (contact dermatitis) to a severe systemic reaction (anaphylactic reaction), which can result in death. Death: In general, any medical intervention can result in death, most of the time due to an unforeseen complication. ______________________________________________________________________

## 2023-10-09 ENCOUNTER — Telehealth: Payer: Self-pay | Admitting: *Deleted

## 2023-10-09 NOTE — Telephone Encounter (Signed)
No problems post procedure. 

## 2023-12-29 NOTE — Patient Instructions (Addendum)
 ______________________________________________________________________    Muscle Spasms & Cramps  Cause(s):  Most common - vitamin and/or electrolyte (calcium, potassium, sodium, etc.) deficiencies. Post procedure - steroids (injected, oral, or inhaled) can make your kidneys excrete (loose) electrolytes. Most of the time this will not cause any symptoms however, if you happen to be borderline low on your electrolytes, it may temporarily triggering cramps & spasms.  Possible triggers: Sweating - causes loss of electrolytes thru the skin. Steroids - causes loss of electrolytes thru the urine.  Treatment: (over-the-counter)  Gatorade (or any other electrolyte-replenishing drink) - Take 1, 8 oz glass with each meal (3 times a day). Mechanism of action: Replenishes lost electrolytes. Magnesium  400 to 500 mg - Take 1 tablet twice a day (one with breakfast and one at bedtime). If you have kidney disease talk to your primary care physician before taking any Magnesium . Mechanism of action: Magnesium  is a natural muscle relaxant. Tonic Water with quinine - Take 1, 8 oz glass before bedtime.  Mechanism of action: Quinine is used to treat spasms.  Last Update: 05/30/2023  ______________________________________________________________________      ______________________________________________________________________    Procedure instructions  Stop blood-thinners  Do not eat or drink fluids (other than water) for 6 hours before your procedure  No water for 2 hours before your procedure  Take your blood pressure medicine with a sip of water  Arrive 30 minutes before your appointment  If sedation is planned, bring suitable driver. Teddie Favre, [place], & public transportation are NOT APPROVED)  Carefully read the "Preparing for your procedure" detailed instructions  If you have questions call us  at [phone]  Procedure appointments are for procedures only. NO medication refills or new problem  evaluations.   ______________________________________________________________________      ______________________________________________________________________    Preparing for your procedure  Appointments: If you think you may not be able to keep your appointment, call 24-48 hours in advance to cancel. We need time to make it available to others.  Procedure visits are for procedures only. During your procedure appointment there will be: NO Prescription Refills*. NO medication changes or discussions*. NO discussion of disability issues*. NO unrelated pain problem evaluations*. NO evaluations to order other pain procedures*. *These will be addressed at a separate and distinct evaluation encounter on the provider's evaluation schedule and not during procedure days.  Instructions: Food intake: Avoid eating anything solid for at least 8 hours prior to your procedure. Clear liquid intake: You may take clear liquids such as water up to 2 hours prior to your procedure. (No carbonated drinks. No soda.) Transportation: Unless otherwise stated by your physician, bring a driver. (Driver cannot be a Market researcher, Pharmacist, community, or any other form of public transportation.) Morning Medicines: Except for blood thinners, take all of your other morning medications with a sip of water. Make sure to take your heart and blood pressure medicines. If your blood pressure's lower number is above 100, the case will be rescheduled. Blood thinners: Make sure to stop your blood thinners as instructed.  If you take a blood thinner, but were not instructed to stop it, call our office [phone] and ask to talk to a nurse. Not stopping a blood thinner prior to certain procedures could lead to serious complications. Diabetics on insulin: Notify the staff so that you can be scheduled 1st case in the morning. If your diabetes requires high dose insulin, take only  of your normal insulin dose the morning of the procedure and notify  the staff that  you have done so. Preventing infections: Shower with an antibacterial soap the morning of your procedure.  Build-up your immune system: Take 1000 mg of Vitamin C with every meal (3 times a day) the day prior to your procedure. Antibiotics: Inform the nursing staff if you are taking any antibiotics or if you have any conditions that may require antibiotics prior to procedures. (Example: recent joint implants)   Pregnancy: If you are pregnant make sure to notify the nursing staff. Not doing so may result in injury to the fetus, including death.  Sickness: If you have a cold, fever, or any active infections, call and cancel or reschedule your procedure. Receiving steroids while having an infection may result in complications. Arrival: You must be in the facility at least 30 minutes prior to your scheduled procedure. Tardiness: Your scheduled time is also the cutoff time. If you do not arrive at least 15 minutes prior to your procedure, you will be rescheduled.  Children: Do not bring any children with you. Make arrangements to keep them home. Dress appropriately: There is always a possibility that your clothing may get soiled. Avoid long dresses. Valuables: Do not bring any jewelry or valuables.  Reasons to call and reschedule or cancel your procedure: (Following these recommendations will minimize the risk of a serious complication.) Surgeries: Avoid having procedures within 2 weeks of any surgery. (Avoid for 2 weeks before or after any surgery). Flu Shots: Avoid having procedures within 2 weeks of a flu shots or . (Avoid for 2 weeks before or after immunizations). Barium: Avoid having a procedure within 7-10 days after having had a radiological study involving the use of radiological contrast. (Myelograms, Barium swallow or enema study). Heart attacks: Avoid any elective procedures or surgeries for the initial 6 months after a "Myocardial Infarction" (Heart Attack). Blood thinners: It  is imperative that you stop these medications before procedures. Let us  know if you if you take any blood thinner.  Infection: Avoid procedures during or within two weeks of an infection (including chest colds or gastrointestinal problems). Symptoms associated with infections include: Localized redness, fever, chills, night sweats or profuse sweating, burning sensation when voiding, cough, congestion, stuffiness, runny nose, sore throat, diarrhea, nausea, vomiting, cold or Flu symptoms, recent or current infections. It is specially important if the infection is over the area that we intend to treat. Heart and lung problems: Symptoms that may suggest an active cardiopulmonary problem include: cough, chest pain, breathing difficulties or shortness of breath, dizziness, ankle swelling, uncontrolled high or unusually low blood pressure, and/or palpitations. If you are experiencing any of these symptoms, cancel your procedure and contact your primary care physician for an evaluation.  Remember:  Regular Business hours are:  Monday to Thursday 8:00 AM to 4:00 PM  Provider's Schedule: Renaldo Caroli, MD:  Procedure days: Tuesday and Thursday 7:30 AM to 4:00 PM  Cephus Collin, MD:  Procedure days: Monday and Wednesday 7:30 AM to 4:00 PM Last  Updated: 10/29/2023 ______________________________________________________________________      ______________________________________________________________________    General Risks and Possible Complications  Patient Responsibilities: It is important that you read this as it is part of your informed consent. It is our duty to inform you of the risks and possible complications associated with treatments offered to you. It is your responsibility as a patient to read this and to ask questions about anything that is not clear or that you believe was not covered in this document.  Patient's Rights: You have the right  to refuse treatment. You also have the right  to change your mind, even after initially having agreed to have the treatment done. However, under this last option, if you wait until the last second to change your mind, you may be charged for the materials used up to that point.  Introduction: Medicine is not an Visual merchandiser. Everything in Medicine, including the lack of treatment(s), carries the potential for danger, harm, or loss (which is by definition: Risk). In Medicine, a complication is a secondary problem, condition, or disease that can aggravate an already existing one. All treatments carry the risk of possible complications. The fact that a side effects or complications occurs, does not imply that the treatment was conducted incorrectly. It must be clearly understood that these can happen even when everything is done following the highest safety standards.  No treatment: You can choose not to proceed with the proposed treatment alternative. The "PRO(s)" would include: avoiding the risk of complications associated with the therapy. The "CON(s)" would include: not getting any of the treatment benefits. These benefits fall under one of three categories: diagnostic; therapeutic; and/or palliative. Diagnostic benefits include: getting information which can ultimately lead to improvement of the disease or symptom(s). Therapeutic benefits are those associated with the successful treatment of the disease. Finally, palliative benefits are those related to the decrease of the primary symptoms, without necessarily curing the condition (example: decreasing the pain from a flare-up of a chronic condition, such as incurable terminal cancer).  General Risks and Complications: These are associated to most interventional treatments. They can occur alone, or in combination. They fall under one of the following six (6) categories: no benefit or worsening of symptoms; bleeding; infection; nerve damage; allergic reactions; and/or death. No benefits or worsening of  symptoms: In Medicine there are no guarantees, only probabilities. No healthcare provider can ever guarantee that a medical treatment will work, they can only state the probability that it may. Furthermore, there is always the possibility that the condition may worsen, either directly, or indirectly, as a consequence of the treatment. Bleeding: This is more common if the patient is taking a blood thinner, either prescription or over the counter (example: Goody Powders, Fish oil, Aspirin, Garlic, etc.), or if suffering a condition associated with impaired coagulation (example: Hemophilia, cirrhosis of the liver, low platelet counts, etc.). However, even if you do not have one on these, it can still happen. If you have any of these conditions, or take one of these drugs, make sure to notify your treating physician. Infection: This is more common in patients with a compromised immune system, either due to disease (example: diabetes, cancer, human immunodeficiency virus [HIV], etc.), or due to medications or treatments (example: therapies used to treat cancer and rheumatological diseases). However, even if you do not have one on these, it can still happen. If you have any of these conditions, or take one of these drugs, make sure to notify your treating physician. Nerve Damage: This is more common when the treatment is an invasive one, but it can also happen with the use of medications, such as those used in the treatment of cancer. The damage can occur to small secondary nerves, or to large primary ones, such as those in the spinal cord and brain. This damage may be temporary or permanent and it may lead to impairments that can range from temporary numbness to permanent paralysis and/or brain death. Allergic Reactions: Any time a substance or material comes in contact with our  body, there is the possibility of an allergic reaction. These can range from a mild skin rash (contact dermatitis) to a severe systemic  reaction (anaphylactic reaction), which can result in death. Death: In general, any medical intervention can result in death, most of the time due to an unforeseen complication. ______________________________________________________________________      ______________________________________________________________________    Opioid Pain Medication Update  To: All patients taking opioid pain medications. (I.e.: hydrocodone , hydromorphone, oxycodone, oxymorphone, morphine, codeine, methadone, tapentadol, tramadol, buprenorphine, fentanyl , etc.)  Re: Updated review of side effects and adverse reactions of opioid analgesics, as well as new information about long term effects of this class of medications.  Direct risks of long-term opioid therapy are not limited to opioid addiction and overdose. Potential medical risks include serious fractures, breathing problems during sleep, hyperalgesia, immunosuppression, chronic constipation, bowel obstruction, myocardial infarction, and tooth decay secondary to xerostomia.  Unpredictable adverse effects that can occur even if you take your medication correctly: Cognitive impairment, respiratory depression, and death. Most people think that if they take their medication "correctly", and "as instructed", that they will be safe. Nothing could be farther from the truth. In reality, a significant amount of recorded deaths associated with the use of opioids has occurred in individuals that had taken the medication for a long time, and were taking their medication correctly. The following are examples of how this can happen: Patient taking his/her medication for a long time, as instructed, without any side effects, is given a certain antibiotic or another unrelated medication, which in turn triggers a "Drug-to-drug interaction" leading to disorientation, cognitive impairment, impaired reflexes, respiratory depression or an untoward event leading to serious bodily harm  or injury, including death.  Patient taking his/her medication for a long time, as instructed, without any side effects, develops an acute impairment of liver and/or kidney function. This will lead to a rapid inability of the body to breakdown and eliminate their pain medication, which will result in effects similar to an "overdose", but with the same medicine and dose that they had always taken. This again may lead to disorientation, cognitive impairment, impaired reflexes, respiratory depression or an untoward event leading to serious bodily harm or injury, including death.  A similar problem will occur with patients as they grow older and their liver and kidney function begins to decrease as part of the aging process.  Background information: Historically, the original case for using long-term opioid therapy to treat chronic noncancer pain was based on safety assumptions that subsequent experience has called into question. In 1996, the American Pain Society and the American Academy of Pain Medicine issued a consensus statement supporting long-term opioid therapy. This statement acknowledged the dangers of opioid prescribing but concluded that the risk for addiction was low; respiratory depression induced by opioids was short-lived, occurred mainly in opioid-naive patients, and was antagonized by pain; tolerance was not a common problem; and efforts to control diversion should not constrain opioid prescribing. This has now proven to be wrong. Experience regarding the risks for opioid addiction, misuse, and overdose in community practice has failed to support these assumptions.  According to the Centers for Disease Control and Prevention, fatal overdoses involving opioid analgesics have increased sharply over the past decade. Currently, more than 96,700 people die from drug overdoses every year. Opioids are a factor in 7 out of every 10 overdose deaths. Deaths from drug overdose have surpassed motor vehicle  accidents as the leading cause of death for individuals between the ages of 37  and 54.  Clinical data suggest that neuroendocrine dysfunction may be very common in both men and women, potentially causing hypogonadism, erectile dysfunction, infertility, decreased libido, osteoporosis, and depression. Recent studies linked higher opioid dose to increased opioid-related mortality. Controlled observational studies reported that long-term opioid therapy may be associated with increased risk for cardiovascular events. Subsequent meta-analysis concluded that the safety of long-term opioid therapy in elderly patients has not been proven.   Side Effects and adverse reactions: Common side effects: Drowsiness (sedation). Dizziness. Nausea and vomiting. Constipation. Physical dependence -- Dependence often manifests with withdrawal symptoms when opioids are discontinued or decreased. Tolerance -- As you take repeated doses of opioids, you require increased medication to experience the same effect of pain relief. Respiratory depression -- This can occur in healthy people, especially with higher doses. However, people with COPD, asthma or other lung conditions may be even more susceptible to fatal respiratory impairment.  Uncommon side effects: An increased sensitivity to feeling pain and extreme response to pain (hyperalgesia). Chronic use of opioids can lead to this. Delayed gastric emptying (the process by which the contents of your stomach are moved into your small intestine). Muscle rigidity. Immune system and hormonal dysfunction. Quick, involuntary muscle jerks (myoclonus). Arrhythmia. Itchy skin (pruritus). Dry mouth (xerostomia).  Long-term side effects: Chronic constipation. Sleep-disordered breathing (SDB). Increased risk of bone fractures. Hypothalamic-pituitary-adrenal dysregulation. Increased risk of overdose.  RISKS: Respiratory depression and death: Opioids increase the risk of  respiratory depression and death.  Drug-to-drug interactions: Opioids are relatively contraindicated in combination with benzodiazepines, sleep inducers, and other central nervous system depressants. Other classes of medications (i.e.: certain antibiotics and even over-the-counter medications) may also trigger or induce respiratory depression in some patients.  Medical conditions: Patients with pre-existing respiratory problems are at higher risk of respiratory failure and/or depression when in combination with opioid analgesics. Opioids are relatively contraindicated in some medical conditions such as central sleep apnea.   Fractures and Falls:  Opioids increase the risk and incidence of falls. This is of particular importance in elderly patients.  Endocrine System:  Long-term administration is associated with endocrine abnormalities (endocrinopathies). (Also known as Opioid-induced Endocrinopathy) Influences on both the hypothalamic-pituitary-adrenal axis?and the hypothalamic-pituitary-gonadal axis have been demonstrated with consequent hypogonadism and adrenal insufficiency in both sexes. Hypogonadism and decreased levels of dehydroepiandrosterone sulfate have been reported in men and women. Endocrine effects include: Amenorrhoea in women (abnormal absence of menstruation) Reduced libido in both sexes Decreased sexual function Erectile dysfunction in men Hypogonadisms (decreased testicular function with shrinkage of testicles) Infertility Depression and fatigue Loss of muscle mass Anxiety Depression Immune suppression Hyperalgesia Weight gain Anemia Osteoporosis Patients (particularly women of childbearing age) should avoid opioids. There is insufficient evidence to recommend routine monitoring of asymptomatic patients taking opioids in the long-term for hormonal deficiencies.  Immune System: Human studies have demonstrated that opioids have an immunomodulating effect. These effects  are mediated via opioid receptors both on immune effector cells and in the central nervous system. Opioids have been demonstrated to have adverse effects on antimicrobial response and anti-tumour surveillance. Buprenorphine has been demonstrated to have no impact on immune function.  Opioid Induced Hyperalgesia: Human studies have demonstrated that prolonged use of opioids can lead to a state of abnormal pain sensitivity, sometimes called opioid induced hyperalgesia (OIH). Opioid induced hyperalgesia is not usually seen in the absence of tolerance to opioid analgesia. Clinically, hyperalgesia may be diagnosed if the patient on long-term opioid therapy presents with increased pain. This might be qualitatively and  anatomically distinct from pain related to disease progression or to breakthrough pain resulting from development of opioid tolerance. Pain associated with hyperalgesia tends to be more diffuse than the pre-existing pain and less defined in quality. Management of opioid induced hyperalgesia requires opioid dose reduction.  Cancer: Chronic opioid therapy has been associated with an increased risk of cancer among noncancer patients with chronic pain. This association was more evident in chronic strong opioid users. Chronic opioid consumption causes significant pathological changes in the small intestine and colon. Epidemiological studies have found that there is a link between opium dependence and initiation of gastrointestinal cancers. Cancer is the second leading cause of death after cardiovascular disease. Chronic use of opioids can cause multiple conditions such as GERD, immunosuppression and renal damage as well as carcinogenic effects, which are associated with the incidence of cancers.   Mortality: Long-term opioid use has been associated with increased mortality among patients with chronic non-cancer pain (CNCP).  Prescription of long-acting opioids for chronic noncancer pain was  associated with a significantly increased risk of all-cause mortality, including deaths from causes other than overdose.  Reference: Von Korff M, Kolodny A, Deyo RA, Chou R. Long-term opioid therapy reconsidered. Ann Intern Med. 2011 Sep 6;155(5):325-8. doi: 10.7326/[id number]. PMID: 24401027; PMCID: [id number]. Achilles Achilles, Hayward RA, Dunn KM, [place] KP. Risk of adverse events in patients prescribed long-term opioids: A cohort study in the [place] [facility] [employer]. Eur J Pain. 2019 May;23(5):908-922. doi: 10.1002/ejp.1357. Epub 2019 Jan 31. PMID: 74259563. Colameco S, Coren JS, Ciervo CA. Continuous opioid treatment for chronic noncancer pain: a time for moderation in prescribing. Postgrad Med. 2009 Jul;121(4):61-6. doi: 10.3810/pgm.2009.07.2032. PMID: 87564332. Orlan Bis RN, [place], Blazina I, Sheliah Deutscher, Bougatsos C, Deyo RA. The effectiveness and risks of long-term opioid therapy for chronic pain: a systematic review for a [employer] to [employer]. Ann Intern Med. 2015 Feb 17;162(4):276-86. doi: 10.7326/M14-2559. PMID: 95188416. [facility], [place] DM. NCHS Data Brief No. 22. [place]: Centers for Disease Control and Prevention; 2009. Sep, Increase in Fatal Poisonings Involving Opioid Analgesics in the [place] , 1999-2006. Song IA, Choi HR, Oh TK. Long-term opioid use and mortality in patients with chronic non-cancer pain: Ten-year follow-up study in [place] from 2010 through 2019. EClinicalMedicine. 2022 Jul 18;51:101558. doi: 10.1016/j.eclinm.2022.606301. PMID: 60109323; PMCID: [id number]. Huser, W., Schubert, T., Vogelmann, T. et al. All-cause mortality in patients with long-term opioid therapy compared with non-opioid analgesics for chronic non-cancer pain: a database study. BMC Med 18, 162 (2020). http://lester.info/ Rashidian H, Zendehdel K,  Kamangar F, Malekzadeh R, Haghdoost AA. An Ecological Study of the Association between Opiate Use and Incidence of Cancers. Addict Health. 2016 Fall;8(4):252-260. PMID: 27062376; PMCID: [id number].  Our Goal: Our goal is to control your pain with means other than the use of opioid pain medications.  Our Recommendation: Talk to your physician about coming off of these medications. We can assist you with the tapering down and stopping these medicines. Based on the new information, even if you cannot completely stop the medication, a decrease in the dose may be associated with a lesser risk. Ask for other means of controlling the pain. Decrease or eliminate those factors that significantly contribute to your pain such as smoking, obesity, and a diet heavily tilted towards "inflammatory" nutrients.  Last Updated: 05/27/2023   ______________________________________________________________________       ______________________________________________________________________    National Pain Medication Shortage  The [place] is  experiencing worsening drug shortages. These have had a negative widespread effect on patient care and treatment. Not expected to improve any time soon. Predicted to last past 2029.   Drug shortage list (generic names) Oxycodone IR Oxycodone/APAP Oxymorphone IR Hydromorphone Hydrocodone /APAP Morphine  Where is the problem?  Manufacturing and supply level.  Will this shortage affect you?  Only if you take any of the above pain medications.  How? You may be unable to fill your prescription.  Your pharmacist may offer a "partial fill" of your prescription. (Warning: Do not accept partial fills.) Prescriptions partially filled cannot be transferred to another pharmacy. Read our Medication Rules and Regulation. Depending on how much medicine you are dependent on, you may experience withdrawals when unable to get the medication.  Recommendations: Consider ending your  dependence on opioid pain medications. Ask your pain specialist to assist you with the process. Consider switching to a medication currently not in shortage, such as Buprenorphine. Talk to your pain specialist about this option. Consider decreasing your pain medication requirements by managing tolerance thru "Drug Holidays". This may help minimize withdrawals, should you run out of medicine. Control your pain thru the use of non-pharmacological interventional therapies.   Your prescriber: Prescribers cannot be blamed for shortages. Medication manufacturing and supply issues cannot be fixed by the prescriber.   NOTE: The prescriber is not responsible for supplying the medication, or solving supply issues. Work with your pharmacist to solve it. The patient is responsible for the decision to take or continue taking the medication and for identifying and securing a legal supply source. By law, supplying the medication is the job and responsibility of the pharmacy. The prescriber is responsible for the evaluation, monitoring, and prescribing of these medications.   Prescribers will NOT: Re-issue prescriptions that have been partially filled. Re-issue prescriptions already sent to a pharmacy.  Re-send prescriptions to a different pharmacy because yours did not have your medication. Ask pharmacist to order more medicine or transfer the prescription to another pharmacy. (Read below.)  New 2023 regulation: "July 20, 2022 Revised Regulation Allows DEA-Registered Pharmacies to Transfer Electronic Prescriptions at a Patient's Request DEA Headquarters Division - Public Information Office Patients now have the ability to request their electronic prescription be transferred to another pharmacy without having to go back to their practitioner to initiate the request. This revised regulation went into effect on Monday, July 16, 2022.     At a patient's request, a DEA-registered retail pharmacy can now  transfer an electronic prescription for a controlled substance (schedules II-V) to another DEA-registered retail pharmacy. Prior to this change, patients would have to go through their practitioner to cancel their prescription and have it re-issued to a different pharmacy. The process was taxing and time consuming for both patients and practitioners.    The Drug Enforcement Administration [facility]) published its intent to revise the process for transferring electronic prescriptions on October 07, 2020.  The final rule was published in the federal register on June 14, 2022 and went into effect 30 days later.  Under the final rule, a prescription can only be transferred once between pharmacies, and only if allowed under existing state or other applicable law. The prescription must remain in its electronic form; may not be altered in any way; and the transfer must be communicated directly between two licensed pharmacists. It's important to note, any authorized refills transfer with the original prescription, which means the entire prescription will be filled at the same pharmacy".  Reference: HugeHand.is (DEA  website announcement)  CheapWipes.[email or web address].pdf Financial planner of [employer])   [employer] / Vol. 88, No. 143 / Thursday, June 14, 2022 / Rules and Regulations DEPARTMENT OF JUSTICE  Drug Enforcement Administration  21 CFR Part 1306  [Docket No. [id number]]  RIN [id number] Transfer of Electronic Prescriptions for Schedules II-V Controlled Substances Between Pharmacies for Initial Filling  ______________________________________________________________________       ______________________________________________________________________    Transfer of Pain Medication between Pharmacies  Re: 2023 DEA Clarification on existing  regulation  Published on DEA Website: July 20, 2022  Title: Revised Regulation Allows DEA-Registered Pharmacies to Electrical engineer Prescriptions at a Patient's Request DEA Headquarters Division - [employer]  "Patients now have the ability to request their electronic prescription be transferred to another pharmacy without having to go back to their practitioner to initiate the request. This revised regulation went into effect on Monday, July 16, 2022.     At a patient's request, a DEA-registered retail pharmacy can now transfer an electronic prescription for a controlled substance (schedules II-V) to another DEA-registered retail pharmacy. Prior to this change, patients would have to go through their practitioner to cancel their prescription and have it re-issued to a different pharmacy. The process was taxing and time consuming for both patients and practitioners.    The Drug Enforcement Administration [facility]) published its intent to revise the process for transferring electronic prescriptions on October 07, 2020.  The final rule was published in the federal register on June 14, 2022 and went into effect 30 days later.  Under the final rule, a prescription can only be transferred once between pharmacies, and only if allowed under existing state or other applicable law. The prescription must remain in its electronic form; may not be altered in any way; and the transfer must be communicated directly between two licensed pharmacists. It's important to note, any authorized refills transfer with the original prescription, which means the entire prescription will be filled at the same pharmacy."    REFERENCES: 1. DEA website announcement HugeHand.is  2. Department of Justice website  CheapWipes.at.pdf  3. DEPARTMENT OF  JUSTICE Drug Enforcement Administration 21 CFR Part 1306 [Docket No. [id number]] RIN [id number] "Transfer of Electronic Prescriptions for Schedules II-V Controlled Substances Between Pharmacies for Initial Filling"  ______________________________________________________________________       ______________________________________________________________________    Medication Rules  Purpose: To inform patients, and their family members, of our medication rules and regulations.  Applies to: All patients receiving prescriptions from our practice (written or electronic).  Pharmacy of record: This is the pharmacy where your electronic prescriptions will be sent. Make sure we have the correct one.  Electronic prescriptions: In compliance with the [place]  Strengthen Opioid Misuse Prevention (STOP) Act of 2017 (Session Law 2017-74/H243), effective November 19, 2018, all controlled substances must be electronically prescribed. Written prescriptions, faxing, or calling prescriptions to a pharmacy will no longer be done.  Prescription refills: These will be provided only during in-person appointments. No medications will be renewed without a "face-to-face" evaluation with your provider. Applies to all prescriptions.  NOTE: The following applies primarily to controlled substances (Opioid* Pain Medications).   Type of encounter (visit): For patients receiving controlled substances, face-to-face visits are required. (Not an option and not up to the patient.)  Patient's Responsibilities: Pain Pills: Bring all pain pills to every appointment (except for procedure appointments). Pill counts are required.  Pill Bottles: Bring pills in original pharmacy bottle. Bring bottle, even if empty. Always bring the bottle  of the most recent fill.  Medication refills: You are responsible for knowing and keeping track of what medications you are taking and when is it that you will need a refill. The day before  your appointment: write a list of all prescriptions that need to be refilled. The day of the appointment: give the list to the admitting nurse. Prescriptions will be written only during appointments. No prescriptions will be written on procedure days. If you forget a medication: it will not be "Called in", "Faxed", or "electronically sent". You will need to get another appointment to get these prescribed. No early refills. Do not call asking to have your prescription filled early. Partial  or short prescriptions: Occasionally your pharmacy may not have enough pills to fill your prescription.  NEVER ACCEPT a partial fill or a prescription that is short of the total amount of pills that you were prescribed.  With controlled substances the law allows 72 hours for the pharmacy to complete the prescription.  If the prescription is not completed within 72 hours, the pharmacist will require a new prescription to be written. This means that you will be short on your medicine and we WILL NOT send another prescription to complete your original prescription.  Instead, request the pharmacy to send a carrier to a nearby branch to get enough medication to provide you with your full prescription. Prescription Accuracy: You are responsible for carefully inspecting your prescriptions before leaving our office. Have the discharge nurse carefully go over each prescription with you, before taking them home. Make sure that your name is accurately spelled, that your address is correct. Check the name and dose of your medication to make sure it is accurate. Check the number of pills, and the written instructions to make sure they are clear and accurate. Make sure that you are given enough medication to last until your next medication refill appointment. Taking Medication: Take medication as prescribed. When it comes to controlled substances, taking less pills or less frequently than prescribed is permitted and encouraged. Never  take more pills than instructed. Never take the medication more frequently than prescribed.  Inform other Doctors: Always inform, all of your healthcare providers, of all the medications you take. Pain Medication from other Providers: You are not allowed to accept any additional pain medication from any other Doctor or Healthcare provider. There are two exceptions to this rule. (see below) In the event that you require additional pain medication, you are responsible for notifying us , as stated below. Cough Medicine: Often these contain an opioid, such as codeine or hydrocodone . Never accept or take cough medicine containing these opioids if you are already taking an opioid* medication. The combination may cause respiratory failure and death. Medication Agreement: You are responsible for carefully reading and following our Medication Agreement. This must be signed before receiving any prescriptions from our practice. Safely store a copy of your signed Agreement. Violations to the Agreement will result in no further prescriptions. (Additional copies of our Medication Agreement are available upon request.) Laws, Rules, & Regulations: All patients are expected to follow all [address] and [employer], Rules, [place] [employer]. Ignorance of the Laws does not constitute a valid excuse.  Illegal drugs and Controlled Substances: The use of illegal substances (including, but not limited to marijuana and its derivatives) and/or the illegal use of any controlled substances is strictly prohibited. Violation of this rule may result in the immediate and permanent discontinuation of any and all prescriptions being written by our  practice. The use of any illegal substances is prohibited. Adopted CDC guidelines & recommendations: Target dosing levels will be at or below 60 MME/day. Use of benzodiazepines** is not recommended. Urine Drug testing: Patients taking controlled substances will be required to provide a urine  sample upon request. Do not void before coming to your medication management appointments. Hold emptying your bladder until you are admitted. The admitting nurse will inform you if a sample is required. Our practice reserves the right to call you at any time to provide a sample. Once receiving the call, you have 24 hours to comply with request. Not providing a sample upon request may result in termination of medication therapy.  Exceptions: There are only two exceptions to the rule of not receiving pain medications from other Healthcare Providers. Exception #1 (Emergencies): In the event of an emergency (i.e.: accident requiring emergency care), you are allowed to receive additional pain medication. However, you are responsible for: As soon as you are able, call our office [phone], at any time of the day or night, and leave a message stating your name, the date and nature of the emergency, and the name and dose of the medication prescribed. In the event that your call is answered by a member of our staff, make sure to document and save the date, time, and the name of the person that took your information.  Exception #2 (Planned Surgery): In the event that you are scheduled by another doctor or dentist to have any type of surgery or procedure, you are allowed (for a period no longer than 30 days), to receive additional pain medication, for the acute post-op pain. However, in this case, you are responsible for picking up a copy of our "Post-op Pain Management for Surgeons" handout, and giving it to your surgeon or dentist. This document is available at our office, and does not require an appointment to obtain it. Simply go to our office during business hours (Monday-Thursday from 8:00 AM to 4:00 PM) (Friday 8:00 AM to 12:00 Noon) or if you have a scheduled appointment with us , prior to your surgery, and ask for it by name. In addition, you are responsible for: calling our office [phone], at any time  of the day or night, and leaving a message stating your name, name of your surgeon, type of surgery, and date of procedure or surgery. Failure to comply with your responsibilities may result in termination of therapy involving the controlled substances.  Consequences:  Non-compliance with the above rules may result in permanent discontinuation of medication prescription therapy. All patients receiving any type of controlled substance is expected to comply with the above patient responsibilities. Not doing so may result in permanent discontinuation of medication prescription therapy. Medication Agreement Violation. Following the above rules, including your responsibilities will help you in avoiding a Medication Agreement Violation ("Breaking your Pain Medication Contract").  *Opioid medications include: morphine, codeine, oxycodone, oxymorphone, hydrocodone , hydromorphone, meperidine, tramadol, tapentadol, buprenorphine, fentanyl , methadone. **Benzodiazepine medications include: diazepam (Valium), alprazolam (Xanax), clonazepam (Klonopine), lorazepam (Ativan), clorazepate (Tranxene), chlordiazepoxide (Librium), estazolam (Prosom), oxazepam (Serax), temazepam (Restoril), triazolam (Halcion) (Last updated: 09/11/2023) ______________________________________________________________________      ______________________________________________________________________    Medication Recommendations and Reminders  Applies to: All patients receiving prescriptions (written and/or electronic).  Medication Rules & Regulations: You are responsible for reading, knowing, and following our "Medication Rules" document. These exist for your safety and that of others. They are not flexible and neither are we. Dismissing or ignoring them is an  act of "non-compliance" that may result in complete and irreversible termination of such medication therapy. For safety reasons, "non-compliance" will not be tolerated. As with  the [place] fundamental legal principle of "ignorance of the law is no defense", we will accept no excuses for not having read and knowing the content of documents provided to you by our practice.  Pharmacy of record:  Definition: This is the pharmacy where your electronic prescriptions will be sent.  We do not endorse any particular pharmacy. It is up to you and your insurance to decide what pharmacy to use.  We do not restrict you in your choice of pharmacy. However, once we write for your prescriptions, we will NOT be re-sending more prescriptions to fix restricted supply problems created by your pharmacy, or your insurance.  The pharmacy listed in the electronic medical record should be the one where you want electronic prescriptions to be sent. If you choose to change pharmacy, simply notify our nursing staff. Changes will be made only during your regular appointments and not over the phone.  Recommendations: Keep all of your pain medications in a safe place, under lock and key, even if you live alone. We will NOT replace lost, stolen, or damaged medication. We do not accept "Police Reports" as proof of medications having been stolen. After you fill your prescription, take 1 week's worth of pills and put them away in a safe place. You should keep a separate, properly labeled bottle for this purpose. The remainder should be kept in the original bottle. Use this as your primary supply, until it runs out. Once it's gone, then you know that you have 1 week's worth of medicine, and it is time to come in for a prescription refill. If you do this correctly, it is unlikely that you will ever run out of medicine. To make sure that the above recommendation works, it is very important that you make sure your medication refill appointments are scheduled at least 1 week before you run out of medicine. To do this in an effective manner, make sure that you do not leave the office without scheduling your next  medication management appointment. Always ask the nursing staff to show you in your prescription , when your medication will be running out. Then arrange for the receptionist to get you a return appointment, at least 7 days before you run out of medicine. Do not wait until you have 1 or 2 pills left, to come in. This is very poor planning and does not take into consideration that we may need to cancel appointments due to bad weather, sickness, or emergencies affecting our staff. DO NOT ACCEPT A "Partial Fill": If for any reason your pharmacy does not have enough pills/tablets to completely fill or refill your prescription, do not allow for a "partial fill". The law allows the pharmacy to complete that prescription within 72 hours, without requiring a new prescription. If they do not fill the rest of your prescription within those 72 hours, you will need a separate prescription to fill the remaining amount, which we will NOT provide. If the reason for the partial fill is your insurance, you will need to talk to the pharmacist about payment alternatives for the remaining tablets, but again, DO NOT ACCEPT A PARTIAL FILL, unless you can trust your pharmacist to obtain the remainder of the pills within 72 hours.  Prescription refills and/or changes in medication(s):  Prescription refills, and/or changes in dose or medication, will be conducted only  during scheduled medication management appointments. (Applies to both, written and electronic prescriptions.) No refills on procedure days. No medication will be changed or started on procedure days. No changes, adjustments, and/or refills will be conducted on a procedure day. Doing so will interfere with the diagnostic portion of the procedure. No phone refills. No medications will be "called into the pharmacy". No Fax refills. No weekend refills. No Holliday refills. No after hours refills.  Remember:  Business hours are:  Monday to Thursday 8:00 AM to 4:00  PM Provider's Schedule: Renaldo Caroli, MD - Appointments are:  Medication management: Monday and Wednesday 8:00 AM to 4:00 PM Procedure day: Tuesday and Thursday 7:30 AM to 4:00 PM Cephus Collin, MD - Appointments are:  Medication management: Tuesday and Thursday 8:00 AM to 4:00 PM Procedure day: Monday and Wednesday 7:30 AM to 4:00 PM (Last update: 09/11/2022) ______________________________________________________________________      ______________________________________________________________________     Naloxone  Nasal Spray  Why am I receiving this medication? [place]  STOP ACT requires that all patients taking high dose opioids or at risk of opioids respiratory depression, be prescribed an opioid reversal agent, such as Naloxone  (AKA: Narcan ).  What is this medication? NALOXONE  (nal OX one) treats opioid overdose, which causes slow or shallow breathing, severe drowsiness, or trouble staying awake. Call emergency services after using this medication. You may need additional treatment. Naloxone  works by reversing the effects of opioids. It belongs to a group of medications called opioid blockers.  COMMON BRAND NAME(S): Kloxxado , Narcan   What should I tell my care team before I take this medication? They need to know if you have any of these conditions: Heart disease Substance use disorder An unusual or allergic reaction to naloxone , other medications, foods, dyes, or preservatives Pregnant or trying to get pregnant Breast-feeding  When to use this medication? This medication is to be used for the treatment of respiratory depression (less than 8 breaths per minute) secondary to opioid overdose.   How to use this medication? This medication is for use in the nose. Lay the person on their back. Support their neck with your hand and allow the head to tilt back before giving the medication. The nasal spray should be given into 1 nostril. After giving the medication, move  the person onto their side. Do not remove or test the nasal spray until ready to use. Get emergency medical help right away after giving the first dose of this medication, even if the person wakes up. You should be familiar with how to recognize the signs and symptoms of a narcotic overdose. If more doses are needed, give the additional dose in the other nostril. Talk to your care team about the use of this medication in children. While this medication may be prescribed for children as young as newborns for selected conditions, precautions do apply.  Naloxone  Overdosage: If you think you have taken too much of this medicine contact a poison control center or emergency room at once.  NOTE: This medicine is only for you. Do not share this medicine with others.  What if I miss a dose? This does not apply.  What may interact with this medication? This is only used during an emergency. No interactions are expected during emergency use. This list may not describe all possible interactions. Give your health care provider a list of all the medicines, herbs, non-prescription drugs, or dietary supplements you use. Also tell them if you smoke, drink alcohol, or use illegal drugs. Some items may  interact with your medicine.  What should I watch for while using this medication? Keep this medication ready for use in the case of an opioid overdose. Make sure that you have the phone number of your care team and local hospital ready. You may need to have additional doses of this medication. Each nasal spray contains a single dose. Some emergencies may require additional doses. After use, bring the treated person to the nearest hospital or call 911. Make sure the treating care team knows that the person has received a dose of this medication. You will receive additional instructions on what to do during and after use of this medication before an emergency occurs.  What side effects may I notice from receiving this  medication? Side effects that you should report to your care team as soon as possible: Allergic reactions--skin rash, itching, hives, swelling of the face, lips, tongue, or throat Side effects that usually do not require medical attention (report these to your care team if they continue or are bothersome): Constipation Dryness or irritation inside the nose Headache Increase in blood pressure Muscle spasms Stuffy nose Toothache This list may not describe all possible side effects. Call your doctor for medical advice about side effects. You may report side effects to FDA at 1-800-FDA-1088.  Where should I keep my medication? Because this is an emergency medication, you should keep it with you at all times.  Keep out of the reach of children and pets. Store between 20 and 25 degrees C (68 and 77 degrees F). Do not freeze. Throw away any unused medication after the expiration date. Keep in original box until ready to use.  NOTE: This sheet is a summary. It may not cover all possible information. If you have questions about this medicine, talk to your doctor, pharmacist, or health care provider.   2023 Elsevier/Gold Standard (2021-07-14 00:00:00)  ______________________________________________________________________

## 2023-12-29 NOTE — Progress Notes (Signed)
 PROVIDER NOTE: Information contained herein reflects review and annotations entered in association with encounter. Interpretation of such information and data should be left to medically-trained personnel. Information provided to patient can be located elsewhere in the medical record under "Patient Instructions". Document created using STT-dictation technology, any transcriptional errors that may result from process are unintentional.    Patient: Mariah Poole  Service Category: E/M  Provider: Candi Chafe, MD  DOB: 01-Jun-1946  DOS: 12/30/2023  Referring Provider: Macie Saxon, MD  MRN: [id number]  Specialty: Interventional Pain Management  PCP: Macie Saxon, MD  Type: Established Patient  Setting: Ambulatory outpatient    Location: Office  Delivery: Face-to-face     HPI  Mariah Poole, a 78 y.o. year old female, is here today because of her Chronic pain syndrome [G89.4]. 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, L4-5, L5-S1) w/ neurogenic claudication; Lumbar foraminal stenosis (Multilevel) (Bilateral); Cervicalgia; DDD (degenerative disc disease), cervical; Cervical facet hypertrophy; Cervical facet joint syndrome; Lumbar facet hypertrophy; 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); Deformity of hands due to rheumatoid arthritis (HCC) (Bilateral); Chronic hand pain, right; Primary osteoarthritis of both hands; Neurogenic pain; Rheumatoid arthritis involving both hands with positive rheumatoid factor  (HCC); Chronic musculoskeletal pain; Bilateral leg edema; Chronic fatigue; Edema, lower extremity; Rheumatoid arthritis involving multiple sites with positive rheumatoid factor (HCC); Osteoarthritis of hip (Right); Lumbar facet joint pain; Chronic shoulder pain (Left); Rheumatoid arthritis of shoulder (HCC) (Left); Decreased range of motion of shoulder (Left); Abnormal x-ray of lumbar spine (04/21/2023); and Grade 1 Anterolisthesis of lumbosacral spine (L5/S1) on their pertinent problem list. Pain Assessment: Severity of Chronic pain is reported as a 3 /10. Location: Buttocks Right, Left/radiates down both legs to knees in the front. Onset: More than a month ago. Quality: Aching. Timing: Constant. Modifying factor(s): meds. Vitals:  height is 5' (1.524 m) and weight is 172 lb (78 kg). Her temperature is 97.2 F (36.2 C) (abnormal). Her blood pressure is 156/91 (abnormal) and her pulse is 109 (abnormal). Her respiration is 16 and oxygen saturation is 100%.  BMI: Estimated body mass index is 33.59 kg/m as calculated from the following:   Height as of this encounter: 5' (1.524 m).   Weight as of this encounter: 172 lb (78 kg). Last encounter: 10/02/2023. Last procedure: 10/08/2023.  Reason for encounter: medication management.  The patient indicates doing well with the current medication regimen. No adverse reactions or side effects reported to the medications.   Discussed the use of AI scribe software for clinical note transcription with the patient, who gave verbal consent to proceed.  History of Present Illness   Mariah Poole is a 78 year old female who presents with lower back pain and muscle spasms.  She experiences intermittent lower back pain accompanied by muscle spasms, currently in a flare-up. The pain affects both sides of her lower back. She manages the pain with Vicodin, taken as needed, with the last dose at 7:00 AM today. No recent falls.  Her shoulder pain has improved, allowing  her to raise her arm and lie on it,  which she was previously unable to do.     RTCB: 04/05/2024   Post-procedure evaluation   Procedure: Glenohumeral and acromioclavicular joint Injection #1  Laterality: Left (-LT)  Level: Shoulder   Imaging: Fluoroscopy-guided Non-spinal ([id number]) Anesthesia: Local anesthesia (1-2% Lidocaine ) Anxiolysis: None                 Sedation: No Sedation                       DOS: 10/08/2023  Performed by: Candi Chafe, MD  Purpose: Diagnostic/Therapeutic Indications: Shoulder pain severe enough to impact quality of life or function. Rationale (medical necessity): procedure needed and proper for the diagnosis and/or treatment of Mariah Poole's medical symptoms and needs. 1. Chronic shoulder pain (Left)   2. Decreased range of motion of shoulder (Left)   3. Rheumatoid arthritis involving multiple sites with positive rheumatoid factor (HCC)   4. Rheumatoid arthritis of shoulder (HCC) (Left)   5. Allergic to radiographic contrast media    NAS-11 Pain score:   Pre-procedure: 3 /10   Post-procedure: 0-No pain/10       Effectiveness:  Initial hour after procedure: 100 %. Subsequent 4-6 hours post-procedure: 100 %. Analgesia past initial 6 hours: 60 % (ongoing). Ongoing improvement:  Analgesic: The patient indicates still having an ongoing 60% improvement of the shoulder pain after the procedure done in November of last year. Function: Mariah Poole reports improvement in function ROM: Mariah Poole reports improvement in ROM  Pharmacotherapy Assessment  Analgesic: Hydrocodone /APAP 10/325, 1 tab PO q 8 hrs PRN (30 mg/day of hydrocodone ) (975 mg/day of acetaminophen ) MME/day: 30 mg/day.   Monitoring: [place] PMP: PDMP reviewed during this encounter.       Pharmacotherapy: No side-effects or adverse reactions reported. Compliance: No problems identified. Effectiveness: Clinically acceptable.  Humberto Magnus, RN  12/30/2023  2:01 PM  Sign when Signing  Visit Nursing Pain Medication Assessment:  Safety precautions to be maintained throughout the outpatient stay will include: orient to surroundings, keep bed in low position, maintain call bell within reach at all times, provide assistance with transfer out of bed and ambulation.  Medication Inspection Compliance: Pill count conducted under aseptic conditions, in front of the patient. Neither the pills nor the bottle was removed from the patient's sight at any time. Once count was completed pills were immediately returned to the patient in their original bottle.  Medication: Hydrocodone /APAP Pill/Patch Count:  23 of 120 pills remain Pill/Patch Appearance: Markings consistent with prescribed medication Bottle Appearance: Standard pharmacy container. Clearly labeled. Filled Date: 01 / 20 / 2025 Last Medication intake:  TodayNursing Pain Medication Assessment:     No results found for: "CBDTHCR" No results found for: "D8THCCBX" No results found for: "D9THCCBX"  UDS:  Summary  Date Value Ref Range Status  07/10/2023 Note  Final    Comment:    ==================================================================== [employer] [facility] 13 (MW) ==================================================================== Test                             Result       Flag       Units  Drug Present and Declared for Prescription Verification   Hydrocodone                     2209         EXPECTED   ng/mg creat   Hydromorphone  439          EXPECTED   ng/mg creat   Dihydrocodeine                 596          EXPECTED   ng/mg creat   Norhydrocodone                 4188         EXPECTED   ng/mg creat    Sources of hydrocodone  include scheduled prescription medications.    Hydromorphone, dihydrocodeine and norhydrocodone are expected    metabolites of hydrocodone . Hydromorphone and dihydrocodeine are    also available as scheduled prescription  medications.  ==================================================================== Test                      Result    Flag   Units      Ref Range   Creatinine              57               mg/dL      >=52 ==================================================================== Declared Medications:  The flagging and interpretation on this report are based on the  following declared medications.  Unexpected results may arise from  inaccuracies in the declared medications.   **Note: The testing scope of this panel includes these medications:   Hydrocodone  (Norco)   **Note: The testing scope of this panel does not include the  following reported medications:   Acetaminophen  (Norco)  Albuterol (Ventolin HFA)  Atenolol (Tenormin)  Budesonide (Symbicort )  Duloxetine (Cymbalta)  Famotidine  (Pepcid )  Formoterol (Symbicort )  Furosemide (Lasix)  Iron  Losartan (Cozaar)  Magnesium  (Mag-Ox)  Meloxicam  (Mobic )  Montelukast (Singulair)  Naloxone  (Narcan )  Pantoprazole  (Protonix )  Potassium (Klor-Con)  Pravastatin (Pravachol)  Semaglutide (Ozempic)  Spironolactone (Aldactone)  Topical Lidocaine   Turmeric  Vitamin C  Vitamin D3 ==================================================================== For clinical consultation, please call [phone]. ====================================================================       ROS  Constitutional: Denies any fever or chills Gastrointestinal: No reported hemesis, hematochezia, vomiting, or acute GI distress Musculoskeletal: Denies any acute onset joint swelling, redness, loss of ROM, or weakness Neurological: No reported episodes of acute onset apraxia, aphasia, dysarthria, agnosia, amnesia, paralysis, loss of coordination, or loss of consciousness  Medication Review  DULoxetine, HYDROcodone -acetaminophen , Lidocaine , Semaglutide(0.25 or 0.5MG /DOS), Turmeric Curcumin, Vitamin D3, albuterol, ascorbic acid, atenolol, baclofen,  budesonide-formoterol, famotidine , ferrous sulfate, fluticasone  furoate-vilanterol, furosemide, losartan, magnesium  oxide, meloxicam , montelukast, naloxone , pantoprazole , potassium chloride SA, pravastatin, and spironolactone  History Review  Allergy: Mariah Poole is allergic to contrast media [iodinated contrast media], ioxaglate, and shellfish allergy. Drug: Mariah Poole  reports current drug use. Alcohol:  reports no history of alcohol use. Tobacco:  reports that she has never smoked. She has never used smokeless tobacco. Social: Mariah Poole  reports that she has never smoked. She has never used smokeless tobacco. She reports current drug use. She reports that she does not drink alcohol. Medical:  has a past medical history of Anemia, Arthritis, COPD (chronic obstructive pulmonary disease) (HCC), Depression, Diabetes mellitus without complication (HCC), GERD (gastroesophageal reflux disease), Hiatal hernia, History of contrast media allergy. (IVP dye) (09/01/2015), History of hiatal hernia, Hypercholesteremia, Hypertension, Morbid obesity (HCC), Scoliosis, and Shortness of breath dyspnea. Surgical: Mariah Poole  has a past surgical history that includes Abdominal hysterectomy; Cataract extraction w/ intraocular lens implant (Right); Gastric bypass; Hernia repair; Cataract extraction w/PHACO (Left, 07/19/2015); Cholecystectomy; and Hernia repair. Family:  family history includes Diabetes in her mother; Poole disease in her father.  Laboratory Chemistry Profile   Renal Lab Results  Component Value Date   BUN 14 09/14/2021   CREATININE 1.02 (H) 09/14/2021   BCR 21 06/17/2019   GFRAA 53 (L) 06/17/2019   GFRNONAA 57 (L) 09/14/2021    Hepatic Lab Results  Component Value Date   AST 19 09/14/2021   ALT 11 09/14/2021   ALBUMIN  3.8 09/14/2021   ALKPHOS 66 09/14/2021   LIPASE 19 10/27/2015    Electrolytes Lab Results  Component Value Date   NA 140 09/14/2021   K 4.3 09/14/2021   CL 102  09/14/2021   CALCIUM 9.3 09/14/2021   MG 1.9 06/17/2019    Bone Lab Results  Component Value Date   25OHVITD1 45 06/17/2019   25OHVITD2 <1.0 06/17/2019   25OHVITD3 45 06/17/2019    Inflammation (CRP: Acute Phase) (ESR: Chronic Phase) Lab Results  Component Value Date   CRP 7 06/17/2019   ESRSEDRATE 49 (H) 06/17/2019   LATICACIDVEN 4.0 (H) 11/23/2009         Note: Above Lab results reviewed.  Recent Imaging Review  DG PAIN CLINIC C-ARM 1-60 MIN NO REPORT Fluoro was used, but no Radiologist interpretation will be provided.  Please refer to "NOTES" tab for provider progress note. Note: Reviewed        Physical Exam  General appearance: Well nourished, well developed, and well hydrated. In no apparent acute distress Mental status: Alert, oriented x 3 (person, place, & time)       Respiratory: No evidence of acute respiratory distress Eyes: PERLA Vitals: BP (!) 156/91   Pulse (!) 109   Temp (!) 97.2 F (36.2 C)   Resp 16   Ht 5' (1.524 m)   Wt 172 lb (78 kg)   SpO2 100%   BMI 33.59 kg/m  BMI: Estimated body mass index is 33.59 kg/m as calculated from the following:   Height as of this encounter: 5' (1.524 m).   Weight as of this encounter: 172 lb (78 kg). Ideal: Ideal body weight: 45.5 kg (100 lb 4.9 oz) Adjusted ideal body weight: 58.5 kg (128 lb 15.8 oz)  Assessment   Diagnosis Status  1. Chronic pain syndrome   2. Chronic bilateral low back pain without sciatica   3. Cervicalgia   4. DDD (degenerative disc disease), lumbar   5. Spinal stenosis, lumbar region, with neurogenic claudication   6. Cervical facet joint syndrome   7. Chronic right hip pain   8. Lumbar facet joint syndrome   9. Rheumatoid arthritis involving multiple sites with positive rheumatoid factor (HCC)   10. Chronic musculoskeletal pain   11. Pharmacologic therapy   12. Chronic use of opiate for therapeutic purpose   13. Encounter for medication management   14. Encounter for chronic  pain management   15. 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 Lumbar facet hypertrophy   17. Lumbar facet joint pain   18. Spondylosis without myelopathy or radiculopathy, lumbosacral region    Controlled Worsening Controlled   Updated Problems: No problems updated.  Plan of Care  Problem-specific:  Assessment and Plan    Chronic Lower Back Pain with Acute Spasms Experiencing a flare-up of intermittent lower back pain with acute spasms. Pain is managed with Vicodin as needed. An intramuscular pain relief injection will be administered. Information on over-the-counter medications for muscle spasms will be provided.  Shoulder Pain Shoulder pain has improved.  Able to raise the shoulder and lay on it. No recent falls reported.  General Health Maintenance Recent blood tests, including A1c, kidney function, and cholesterol levels, are within normal limits. Lab results will be reviewed.      Mariah Poole has a current medication list which includes the following long-term medication(s): albuterol, atenolol, duloxetine, famotidine , ferosul, furosemide, [START ON 01/06/2024] hydrocodone -acetaminophen , [START ON 02/05/2024] hydrocodone -acetaminophen , [START ON 03/06/2024] hydrocodone -acetaminophen , montelukast, pantoprazole , potassium chloride sa, pravastatin, symbicort , and spironolactone.  Pharmacotherapy (Medications Ordered): Meds ordered this encounter  Medications   HYDROcodone -acetaminophen  (NORCO) 10-325 MG tablet    Sig: Take 1 tablet by mouth every 8 (eight) hours. Must last 30 days    Dispense:  90 tablet    Refill:  0    DO NOT: delete (not duplicate); no partial-fill (will deny script to complete), no refill request (F/U required). DISPENSE: 1 day early if closed on fill date. WARN: No CNS-depressants within 8 hrs of med.   HYDROcodone -acetaminophen  (NORCO) 10-325 MG tablet    Sig: Take 1 tablet by mouth every 8 (eight) hours. Must last 30 days     Dispense:  90 tablet    Refill:  0    DO NOT: delete (not duplicate); no partial-fill (will deny script to complete), no refill request (F/U required). DISPENSE: 1 day early if closed on fill date. WARN: No CNS-depressants within 8 hrs of med.   HYDROcodone -acetaminophen  (NORCO) 10-325 MG tablet    Sig: Take 1 tablet by mouth every 8 (eight) hours. Must last 30 days    Dispense:  90 tablet    Refill:  0    DO NOT: delete (not duplicate); no partial-fill (will deny script to complete), no refill request (F/U required). DISPENSE: 1 day early if closed on fill date. WARN: No CNS-depressants within 8 hrs of med.   Orders:  Orders Placed This Encounter  Procedures   LUMBAR FACET(MEDIAL BRANCH NERVE BLOCK) MBNB    Diagnosis: Lumbar Facet Syndrome (M47.816); Lumbosacral Facet Syndrome (M47.817); Lumbar Facet Joint Pain (M54.59) Medical Necessity Statement: 1.Severe chronic axial low back pain causing functional impairment documented by ongoing pain scale assessments. 2.Pain present for longer than 3 months (Chronic) documented to have failed noninvasive conservative therapies. 3.Absence of untreated radiculopathy. 4.There is no radiological evidence of untreated fractures, tumor, infection, or deformity.  Physical Examination Findings: Positive Kemp Maneuver: (Y)  Positive Lumbar Hyperextension-Rotation provocative test: (Y)    Standing Status:   Future    Expiration Date:   03/28/2024    Scheduling Instructions:     Procedure: Lumbar facet Block     Type: Medial Branch Block     Side: Bilateral     Purpose: Therapeutic     Level(s): L3-4, L4-5, L5-S1, and TBD by Fluoroscopic Mapping Facets (L2, L3, L4, L5, S1, and TBD Medial Branch)     Sedation: With Sedation.     Timeframe: ASAA    Where will this procedure be performed?:   [facility]   Follow-up plan:   Return for (ECT): (B) L-FCT Blk #4 (1st of 2025).      Interventional Therapies  Risk  Complexity Considerations:    Allergy: CONTRAST  COPD  BA  CKD  SOBOE  GERD  Jehovah's witness  Hx. intestinal obstruction  CHF  HTN  DM  MO (BMI>30)  No RFA until patient brings BMI down to less than 30 kg/m   Planned  Pending:   Therapeutic bilateral lumbar facet MBB #4  Under consideration:   Therapeutic bilateral lumbar facet MBB #4  Possible bilateral lumbar facet RFA #1 (on hold until BMI is brought down to 30 kg/m)   Completed:   Diagnostic left IA shoulder and AC joint inj. x1 (10/08/2023) (100/100/60/60)  Diagnostic bilateral lumbar facet MBB x3 (01/18/2022) (5/10 to 0/10) (100/100/75/75-100)    Therapeutic  Palliative (PRN) options:   Therapeutic/palliative bilateral lumbar facet MBB   Pharmacotherapy  Nonopioids transferred 11/09/2020: Magnesium , Lyrica , and Mobic .      Recent Visits Date Type Provider Dept  10/08/23 Procedure visit Renaldo Caroli, MD [facility]  10/02/23 Office Visit Renaldo Caroli, MD [facility]  Showing recent visits within past 90 days and meeting all other requirements Today's Visits Date Type Provider Dept  12/30/23 Office Visit Renaldo Caroli, MD [facility]  Showing today's visits and meeting all other requirements Future Appointments No visits were found meeting these conditions. Showing future appointments within next 90 days and meeting all other requirements  I discussed the assessment and treatment plan with the patient. The patient was provided an opportunity to ask questions and all were answered. The patient agreed with the plan and demonstrated an understanding of the instructions.  Patient advised to call back or seek an in-person evaluation if the symptoms or condition worsens.  Duration of encounter: 30 minutes.  Total time on encounter, as per AMA guidelines included both the face-to-face and non-face-to-face time personally spent by the physician and/or other qualified health care  professional(s) on the day of the encounter (includes time in activities that require the physician or other qualified health care professional and does not include time in activities normally performed by clinical staff). Physician's time may include the following activities when performed: Preparing to see the patient (e.g., pre-charting review of records, searching for previously ordered imaging, lab work, and nerve conduction tests) Review of prior analgesic pharmacotherapies. Reviewing PMP Interpreting ordered tests (e.g., lab work, imaging, nerve conduction tests) Performing post-procedure evaluations, including interpretation of diagnostic procedures Obtaining and/or reviewing separately obtained history Performing a medically appropriate examination and/or evaluation Counseling and educating the patient/family/caregiver Ordering medications, tests, or procedures Referring and communicating with other health care professionals (when not separately reported) Documenting clinical information in the electronic or other health record Independently interpreting results (not separately reported) and communicating results to the patient/ family/caregiver Care coordination (not separately reported)  Note by: Candi Chafe, MD Date: 12/30/2023; Time: 2:27 PM

## 2023-12-30 ENCOUNTER — Ambulatory Visit: Payer: Medicare PPO | Attending: Pain Medicine | Admitting: Pain Medicine

## 2023-12-30 ENCOUNTER — Encounter: Payer: Self-pay | Admitting: Pain Medicine

## 2023-12-30 VITALS — BP 156/91 | HR 109 | Temp 97.2°F | Resp 16 | Ht 60.0 in | Wt 172.0 lb

## 2023-12-30 DIAGNOSIS — Z79891 Long term (current) use of opiate analgesic: Secondary | ICD-10-CM | POA: Diagnosis present

## 2023-12-30 DIAGNOSIS — M47817 Spondylosis without myelopathy or radiculopathy, lumbosacral region: Secondary | ICD-10-CM | POA: Diagnosis present

## 2023-12-30 DIAGNOSIS — M25551 Pain in right hip: Secondary | ICD-10-CM | POA: Insufficient documentation

## 2023-12-30 DIAGNOSIS — M5136 Other intervertebral disc degeneration, lumbar region with discogenic back pain only: Secondary | ICD-10-CM | POA: Diagnosis not present

## 2023-12-30 DIAGNOSIS — G8929 Other chronic pain: Secondary | ICD-10-CM | POA: Diagnosis present

## 2023-12-30 DIAGNOSIS — Z79899 Other long term (current) drug therapy: Secondary | ICD-10-CM | POA: Diagnosis present

## 2023-12-30 DIAGNOSIS — G894 Chronic pain syndrome: Secondary | ICD-10-CM | POA: Diagnosis not present

## 2023-12-30 DIAGNOSIS — M545 Low back pain, unspecified: Secondary | ICD-10-CM | POA: Diagnosis present

## 2023-12-30 DIAGNOSIS — M542 Cervicalgia: Secondary | ICD-10-CM | POA: Diagnosis present

## 2023-12-30 DIAGNOSIS — M0579 Rheumatoid arthritis with rheumatoid factor of multiple sites without organ or systems involvement: Secondary | ICD-10-CM | POA: Diagnosis present

## 2023-12-30 DIAGNOSIS — M4317 Spondylolisthesis, lumbosacral region: Secondary | ICD-10-CM | POA: Insufficient documentation

## 2023-12-30 DIAGNOSIS — M48062 Spinal stenosis, lumbar region with neurogenic claudication: Secondary | ICD-10-CM | POA: Insufficient documentation

## 2023-12-30 DIAGNOSIS — M47812 Spondylosis without myelopathy or radiculopathy, cervical region: Secondary | ICD-10-CM | POA: Diagnosis present

## 2023-12-30 DIAGNOSIS — M7918 Myalgia, other site: Secondary | ICD-10-CM | POA: Diagnosis present

## 2023-12-30 DIAGNOSIS — M47816 Spondylosis without myelopathy or radiculopathy, lumbar region: Secondary | ICD-10-CM | POA: Diagnosis present

## 2023-12-30 DIAGNOSIS — M5459 Other low back pain: Secondary | ICD-10-CM | POA: Diagnosis present

## 2023-12-30 DIAGNOSIS — M51369 Other intervertebral disc degeneration, lumbar region without mention of lumbar back pain or lower extremity pain: Secondary | ICD-10-CM

## 2023-12-30 MED ORDER — HYDROCODONE-ACETAMINOPHEN 10-325 MG PO TABS: 1.0000 | ORAL_TABLET | Freq: Three times a day (TID) | ORAL | 0 refills | Status: DC

## 2023-12-30 NOTE — Progress Notes (Signed)
 Nursing Pain Medication Assessment:  Safety precautions to be maintained throughout the outpatient stay will include: orient to surroundings, keep bed in low position, maintain call bell within reach at all times, provide assistance with transfer out of bed and ambulation.  Medication Inspection Compliance: Pill count conducted under aseptic conditions, in front of the patient. Neither the pills nor the bottle was removed from the patient's sight at any time. Once count was completed pills were immediately returned to the patient in their original bottle.  Medication: Hydrocodone /APAP Pill/Patch Count:  23 of 120 pills remain Pill/Patch Appearance: Markings consistent with prescribed medication Bottle Appearance: Standard pharmacy container. Clearly labeled. Filled Date: 01 / 20 / 2025 Last Medication intake:  TodayNursing Pain Medication Assessment:

## 2024-01-09 ENCOUNTER — Ambulatory Visit: Payer: Medicare PPO | Admitting: Pain Medicine

## 2024-01-16 ENCOUNTER — Encounter: Payer: Self-pay | Admitting: Pain Medicine

## 2024-01-16 ENCOUNTER — Ambulatory Visit: Payer: Medicare PPO | Attending: Pain Medicine | Admitting: Pain Medicine

## 2024-01-16 ENCOUNTER — Ambulatory Visit
Admission: RE | Admit: 2024-01-16 | Discharge: 2024-01-16 | Disposition: A | Discharge status: Home / Self Care | Payer: Medicare PPO | Source: Ambulatory Visit | Attending: Pain Medicine | Admitting: Pain Medicine

## 2024-01-16 VITALS — BP 144/78 | HR 90 | Temp 97.3°F | Resp 13 | Ht 60.0 in | Wt 172.0 lb

## 2024-01-16 DIAGNOSIS — Z91041 Radiographic dye allergy status: Secondary | ICD-10-CM | POA: Diagnosis present

## 2024-01-16 DIAGNOSIS — M5459 Other low back pain: Secondary | ICD-10-CM | POA: Diagnosis present

## 2024-01-16 DIAGNOSIS — M545 Low back pain, unspecified: Secondary | ICD-10-CM | POA: Insufficient documentation

## 2024-01-16 DIAGNOSIS — M5136 Other intervertebral disc degeneration, lumbar region with discogenic back pain only: Secondary | ICD-10-CM | POA: Insufficient documentation

## 2024-01-16 DIAGNOSIS — M0579 Rheumatoid arthritis with rheumatoid factor of multiple sites without organ or systems involvement: Secondary | ICD-10-CM | POA: Diagnosis present

## 2024-01-16 DIAGNOSIS — M47817 Spondylosis without myelopathy or radiculopathy, lumbosacral region: Secondary | ICD-10-CM | POA: Diagnosis not present

## 2024-01-16 DIAGNOSIS — G8929 Other chronic pain: Secondary | ICD-10-CM | POA: Insufficient documentation

## 2024-01-16 DIAGNOSIS — M4317 Spondylolisthesis, lumbosacral region: Secondary | ICD-10-CM | POA: Diagnosis present

## 2024-01-16 DIAGNOSIS — M47816 Spondylosis without myelopathy or radiculopathy, lumbar region: Secondary | ICD-10-CM | POA: Insufficient documentation

## 2024-01-16 MED ORDER — MIDAZOLAM HCL 5 MG/5ML IJ SOLN
INTRAMUSCULAR | Status: AC
  Filled 2024-01-16: qty 5

## 2024-01-16 MED ORDER — ROPIVACAINE HCL 2 MG/ML IJ SOLN
18.0000 mL | Freq: Once | INTRAMUSCULAR | Status: AC
  Administered 2024-01-16: 18 mL via PERINEURAL

## 2024-01-16 MED ORDER — TRIAMCINOLONE ACETONIDE 40 MG/ML IJ SUSP
80.0000 mg | Freq: Once | INTRAMUSCULAR | Status: AC
  Administered 2024-01-16: 80 mg

## 2024-01-16 MED ORDER — FENTANYL CITRATE (PF) 100 MCG/2ML IJ SOLN
25.0000 ug | INTRAMUSCULAR | Status: DC | PRN
Start: 2024-01-16 — End: 2024-01-16
  Administered 2024-01-16: 50 ug via INTRAVENOUS

## 2024-01-16 MED ORDER — FENTANYL CITRATE (PF) 100 MCG/2ML IJ SOLN
INTRAMUSCULAR | Status: AC
  Filled 2024-01-16: qty 2

## 2024-01-16 MED ORDER — LIDOCAINE HCL 2 % IJ SOLN
INTRAMUSCULAR | Status: AC
  Filled 2024-01-16: qty 20

## 2024-01-16 MED ORDER — PENTAFLUOROPROP-TETRAFLUOROETH EX AERO
INHALATION_SPRAY | Freq: Once | CUTANEOUS | Status: AC
Start: 2024-01-16 — End: 2024-01-16
  Administered 2024-01-16: 30 via TOPICAL

## 2024-01-16 MED ORDER — TRIAMCINOLONE ACETONIDE 40 MG/ML IJ SUSP
INTRAMUSCULAR | Status: AC
  Filled 2024-01-16: qty 2

## 2024-01-16 MED ORDER — ROPIVACAINE HCL 2 MG/ML IJ SOLN
INTRAMUSCULAR | Status: AC
  Filled 2024-01-16: qty 20

## 2024-01-16 MED ORDER — LIDOCAINE HCL 2 % IJ SOLN
20.0000 mL | Freq: Once | INTRAMUSCULAR | Status: AC
  Administered 2024-01-16: 400 mg

## 2024-01-16 MED ORDER — MIDAZOLAM HCL 5 MG/5ML IJ SOLN
0.5000 mg | Freq: Once | INTRAMUSCULAR | Status: AC
  Administered 2024-01-16: 2 mg via INTRAVENOUS

## 2024-01-16 NOTE — Patient Instructions (Signed)

## 2024-01-16 NOTE — Progress Notes (Signed)
 PROVIDER NOTE: Interpretation of information contained herein should be left to medically-trained personnel. Specific patient instructions are provided elsewhere under "Patient Instructions" section of medical record. This document was created in part using STT-dictation technology, any transcriptional errors that may result from this process are unintentional.  Patient: Mariah Poole Type: Established DOB: 23-Oct-1946 MRN: [id number] PCP: Leanna Sato, MD  Service: Procedure DOS: 01/16/2024 Setting: Ambulatory Location: Ambulatory outpatient facility Delivery: Face-to-face Provider: Oswaldo Done, MD Specialty: Interventional Pain Management Specialty designation: 09 Location: Outpatient facility Ref. Prov.: Leanna Sato, MD       Interventional Therapy   Type: Lumbar Facet, Medial Branch Block(s) (w/ fluoroscopic mapping) #4  Laterality: Bilateral  Level: L2, L3, L4, L5, and S1 Medial Branch Level(s). Injecting these levels blocks the L3-4, L4-5, and L5-S1 lumbar facet joints. Imaging: Fluoroscopic guidance Spinal ([id number]) Anesthesia: Local anesthesia (1-2% Lidocaine) Anxiolysis: IV Versed         Sedation: Moderate Sedation                       DOS: 01/16/2024 Performed by: Oswaldo Done, MD  Primary Purpose: Diagnostic/Therapeutic Indications: Low back pain severe enough to impact quality of life or function. 1. Chronic low back pain (1ry area of Pain) (Bilateral) (R>L) w/o sciatica   2. DDD (degenerative disc disease), lumbar w/ LBP   3. 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 Lumbar facet hypertrophy   5. Lumbar facet joint pain   6. Lumbar facet syndrome (Bilateral) (R>L)   7. Spondylosis without myelopathy or radiculopathy, lumbosacral region   8. Rheumatoid arthritis involving multiple sites with positive rheumatoid factor (HCC)    NAS-11 Pain score:   Pre-procedure: 8 /10   Post-procedure: 8 /10     Position / Prep / Materials:   Position: Prone  Prep solution: ChloraPrep (2% chlorhexidine gluconate and 70% isopropyl alcohol) Area Prepped: Posterolateral Lumbosacral Spine (Wide prep: From the lower border of the scapula down to the end of the tailbone and from flank to flank.)  Materials:  Tray: Block Needle(s):  Type: Spinal  Gauge (G): 22  Length: 5-in Qty: 4      H&P (Pre-op Assessment):  Mariah Poole is a 78 y.o. (year old), female patient, seen today for interventional treatment. She  has a past surgical history that includes Abdominal hysterectomy; Cataract extraction w/ intraocular lens implant (Right); Gastric bypass; Hernia repair; Cataract extraction w/PHACO (Left, 07/19/2015); Cholecystectomy; and Hernia repair. Mariah Poole has a current medication list which includes the following prescription(s): albuterol, atenolol, baclofen, vitamin d3, duloxetine, famotidine, ferosul, fluticasone furoate-vilanterol, furosemide, lidocaine, hydrocodone-acetaminophen, [START ON 02/05/2024] hydrocodone-acetaminophen, [START ON 03/06/2024] hydrocodone-acetaminophen, losartan, magnesium oxide, meloxicam, montelukast, naloxone, ozempic (0.25 or 0.5 mg/dose), pantoprazole, potassium chloride sa, pravastatin, symbicort, turmeric curcumin, ascorbic acid, and spironolactone, and the following Facility-Administered Medications: fentanyl, lidocaine, midazolam, pentafluoroprop-tetrafluoroeth, ropivacaine (pf) 2 mg/ml (0.2%), and triamcinolone acetonide. Her primarily concern today is the Back Pain (lower)  Initial Vital Signs:  Pulse/HCG Rate: 90  Temp: 97.9 F (36.6 C) Resp: 18 BP: (!) 146/84 SpO2: 98 %  BMI: Estimated body mass index is 33.59 kg/m as calculated from the following:   Height as of this encounter: 5' (1.524 m).   Weight as of this encounter: 172 lb (78 kg).  Risk Assessment: Allergies: Reviewed. She is allergic to contrast media [iodinated contrast media], ioxaglate, and shellfish allergy.  Allergy Precautions:  None required Coagulopathies: Reviewed. None identified.  Blood-thinner therapy: None  at this time Active Infection(s): Reviewed. None identified. Mariah Poole is afebrile  Site Confirmation: Mariah Poole was asked to confirm the procedure and laterality before marking the site Procedure checklist: Completed Consent: Before the procedure and under the influence of no sedative(s), amnesic(s), or anxiolytics, the patient was informed of the treatment options, risks and possible complications. To fulfill our ethical and legal obligations, as recommended by the American Medical Association's Code of Ethics, I have informed the patient of my clinical impression; the nature and purpose of the treatment or procedure; the risks, benefits, and possible complications of the intervention; the alternatives, including doing nothing; the risk(s) and benefit(s) of the alternative treatment(s) or procedure(s); and the risk(s) and benefit(s) of doing nothing. The patient was provided information about the general risks and possible complications associated with the procedure. These may include, but are not limited to: failure to achieve desired goals, infection, bleeding, organ or nerve damage, allergic reactions, paralysis, and death. In addition, the patient was informed of those risks and complications associated to Spine-related procedures, such as failure to decrease pain; infection (i.e.: Meningitis, epidural or intraspinal abscess); bleeding (i.e.: epidural hematoma, subarachnoid hemorrhage, or any other type of intraspinal or peri-dural bleeding); organ or nerve damage (i.e.: Any type of peripheral nerve, nerve root, or spinal cord injury) with subsequent damage to sensory, motor, and/or autonomic systems, resulting in permanent pain, numbness, and/or weakness of one or several areas of the body; allergic reactions; (i.e.: anaphylactic reaction); and/or death. Furthermore, the patient was informed of those risks and  complications associated with the medications. These include, but are not limited to: allergic reactions (i.e.: anaphylactic or anaphylactoid reaction(s)); adrenal axis suppression; blood sugar elevation that in diabetics may result in ketoacidosis or comma; water retention that in patients with history of congestive heart failure may result in shortness of breath, pulmonary edema, and decompensation with resultant heart failure; weight gain; swelling or edema; medication-induced neural toxicity; particulate matter embolism and blood vessel occlusion with resultant organ, and/or nervous system infarction; and/or aseptic necrosis of one or more joints. Finally, the patient was informed that Medicine is not an exact science; therefore, there is also the possibility of unforeseen or unpredictable risks and/or possible complications that may result in a catastrophic outcome. The patient indicated having understood very clearly. We have given the patient no guarantees and we have made no promises. Enough time was given to the patient to ask questions, all of which were answered to the patient's satisfaction. Ms. Wherry has indicated that she wanted to continue with the procedure. Attestation: I, the ordering provider, attest that I have discussed with the patient the benefits, risks, side-effects, alternatives, likelihood of achieving goals, and potential problems during recovery for the procedure that I have provided informed consent. Date  Time: 01/16/2024  9:39 AM   Pre-Procedure Preparation:  Monitoring: As per clinic protocol. Respiration, ETCO2, SpO2, BP, heart rate and rhythm monitor placed and checked for adequate function Safety Precautions: Patient was assessed for positional comfort and pressure points before starting the procedure. Time-out: I initiated and conducted the "Time-out" before starting the procedure, as per protocol. The patient was asked to participate by confirming the accuracy of the  "Time Out" information. Verification of the correct person, site, and procedure were performed and confirmed by me, the nursing staff, and the patient. "Time-out" conducted as per Joint Commission's Universal Protocol (UP.01.01.01). Time:   Start Time:   hrs.  Description of Procedure:  Laterality: (see above) Targeted Levels: (see above)  Safety Precautions: Aspiration looking for blood return was conducted prior to all injections. At no point did we inject any substances, as a needle was being advanced. Before injecting, the patient was told to immediately notify me if she was experiencing any new onset of "ringing in the ears, or metallic taste in the mouth". No attempts were made at seeking any paresthesias. Safe injection practices and needle disposal techniques used. Medications properly checked for expiration dates. SDV (single dose vial) medications used. After the completion of the procedure, all disposable equipment used was discarded in the proper designated medical waste containers. Local Anesthesia: Protocol guidelines were followed. The patient was positioned over the fluoroscopy table. The area was prepped in the usual manner. The time-out was completed. The target area was identified using fluoroscopy. A 12-in long, straight, sterile hemostat was used with fluoroscopic guidance to locate the targets for each level blocked. Once located, the skin was marked with an approved surgical skin marker. Once all sites were marked, the skin (epidermis, dermis, and hypodermis), as well as deeper tissues (fat, connective tissue and muscle) were infiltrated with a small amount of a short-acting local anesthetic, loaded on a 10cc syringe with a 25G, 1.5-in  Needle. An appropriate amount of time was allowed for local anesthetics to take effect before proceeding to the next step. Local Anesthetic: Lidocaine 2.0% The unused portion of the local anesthetic was discarded in the proper designated  containers. Technical description of process:  L2 Medial Branch Nerve Block (MBB): The target area for the L2 medial branch is at the junction of the postero-lateral aspect of the superior articular process and the superior, posterior, and medial edge of the transverse process of L3. Under fluoroscopic guidance, a Quincke needle was inserted until contact was made with os over the superior postero-lateral aspect of the pedicular shadow (target area). After negative aspiration for blood, 0.5 mL of the nerve block solution was injected without difficulty or complication. The needle was removed intact. L3 Medial Branch Nerve Block (MBB): The target area for the L3 medial branch is at the junction of the postero-lateral aspect of the superior articular process and the superior, posterior, and medial edge of the transverse process of L4. Under fluoroscopic guidance, a Quincke needle was inserted until contact was made with os over the superior postero-lateral aspect of the pedicular shadow (target area). After negative aspiration for blood, 0.5 mL of the nerve block solution was injected without difficulty or complication. The needle was removed intact. L4 Medial Branch Nerve Block (MBB): The target area for the L4 medial branch is at the junction of the postero-lateral aspect of the superior articular process and the superior, posterior, and medial edge of the transverse process of L5. Under fluoroscopic guidance, a Quincke needle was inserted until contact was made with os over the superior postero-lateral aspect of the pedicular shadow (target area). After negative aspiration for blood, 0.5 mL of the nerve block solution was injected without difficulty or complication. The needle was removed intact. L5 Medial Branch Nerve Block (MBB): The target area for the L5 medial branch is at the junction of the postero-lateral aspect of the superior articular process and the superior, posterior, and medial edge of the  sacral ala. Under fluoroscopic guidance, a Quincke needle was inserted until contact was made with os over the superior postero-lateral aspect of the pedicular shadow (target area). After negative aspiration for blood, 0.5 mL of the nerve block solution  was injected without difficulty or complication. The needle was removed intact. S1 Medial Branch Nerve Block (MBB): The target area for the S1 medial branch is at the posterior and inferior 6 o'clock position of the L5-S1 facet joint. Under fluoroscopic guidance, the Quincke needle inserted for the L5 MBB was redirected until contact was made with os over the inferior and postero aspect of the sacrum, at the 6 o' clock position under the L5-S1 facet joint (Target area). After negative aspiration for blood, 0.5 mL of the nerve block solution was injected without difficulty or complication. The needle was removed intact.  Once the entire procedure was completed, the treated area was cleaned, making sure to leave some of the prepping solution back to take advantage of its long term bactericidal properties.         Illustration of the posterior view of the lumbar spine and the posterior neural structures. Laminae of L2 through S1 are labeled. DPRL5, dorsal primary ramus of L5; DPRS1, dorsal primary ramus of S1; DPR3, dorsal primary ramus of L3; FJ, facet (zygapophyseal) joint L3-L4; I, inferior articular process of L4; LB1, lateral branch of dorsal primary ramus of L1; IAB, inferior articular branches from L3 medial branch (supplies L4-L5 facet joint); IBP, intermediate branch plexus; MB3, medial branch of dorsal primary ramus of L3; NR3, third lumbar nerve root; S, superior articular process of L5; SAB, superior articular branches from L4 (supplies L4-5 facet joint also); TP3, transverse process of L3.   Facet Joint Innervation (* possible contribution)  L1-2 T12, L1 (L2*)  Medial Branch  L2-3 L1, L2 (L3*)         "          "  L3-4 L2, L3 (L4*)         "           "  L4-5 L3, L4 (L5*)         "          "  L5-S1 L4, L5, S1          "          "    Vitals:   01/16/24 0938  BP: (!) 146/84  Pulse: 90  Resp: 18  Temp: 97.9 F (36.6 C)  TempSrc: Temporal  SpO2: 98%  Weight: 172 lb (78 kg)  Height: 5' (1.524 m)     End Time:   hrs.  Imaging Guidance (Spinal):          Type of Imaging Technique: Fluoroscopy Guidance (Spinal) Indication(s): Fluoroscopy guidance for needle placement to enhance accuracy in procedures requiring precise needle localization for targeted delivery of medication in or near specific anatomical locations not easily accessible without such real-time imaging assistance. Exposure Time: Please see nurses notes. Contrast: None used. Fluoroscopic Guidance: I was personally present during the use of fluoroscopy. "Tunnel Vision Technique" used to obtain the best possible view of the target area. Parallax error corrected before commencing the procedure. "Direction-depth-direction" technique used to introduce the needle under continuous pulsed fluoroscopy. Once target was reached, antero-posterior, oblique, and lateral fluoroscopic projection used confirm needle placement in all planes. Images permanently stored in EMR. Interpretation: No contrast injected. I personally interpreted the imaging intraoperatively. Adequate needle placement confirmed in multiple planes. Permanent images saved into the patient's record.  Post-operative Assessment:  Post-procedure Vital Signs:  Pulse/HCG Rate: 90  Temp: 97.9 F (36.6 C) Resp: 18 BP: (!) 146/84 SpO2: 98 %  EBL: None  Complications: No  immediate post-treatment complications observed by team, or reported by patient.  Note: The patient tolerated the entire procedure well. A repeat set of vitals were taken after the procedure and the patient was kept under observation following institutional policy, for this type of procedure. Post-procedural neurological assessment was performed,  showing return to baseline, prior to discharge. The patient was provided with post-procedure discharge instructions, including a section on how to identify potential problems. Should any problems arise concerning this procedure, the patient was given instructions to immediately contact us, at any time, without hesitation. In any case, we plan to contact the patient by telephone for a follow-up status report regarding this interventional procedure.  Comments:  No additional relevant information.  Plan of Care (POC)  Orders:  Orders Placed This Encounter  Procedures   LUMBAR FACET(MEDIAL BRANCH NERVE BLOCK) MBNB    Scheduling Instructions:     Procedure: Lumbar facet block (AKA.: Lumbosacral medial branch nerve block)     Side: Bilateral     Level: L3-4, L4-5, L5-S1, and TBD Facets (L2, L3, L4, L5, S1, and TBD Medial Branch Nerves)     Sedation: Patient's choice.     Timeframe: Today    Where will this procedure be performed?:   [facility]   [facility] C-ARM 1-60 MIN NO REPORT    Intraoperative interpretation by procedural physician at [facility].    Standing Status:   Standing    Number of Occurrences:   1    Reason for exam::   Assistance in needle guidance and placement for procedures requiring needle placement in or near specific anatomical locations not easily accessible without such assistance.   Informed Consent Details: Physician/Practitioner Attestation; Transcribe to consent form and obtain patient signature    Nursing Order: Transcribe to consent form and obtain patient signature. Note: Always confirm laterality of pain with Ms. Mcclanahan, before procedure.    Physician/Practitioner attestation of informed consent for procedure/surgical case:   I, the physician/practitioner, attest that I have discussed with the patient the benefits, risks, side effects, alternatives, likelihood of achieving goals and potential problems during recovery for the procedure that I  have provided informed consent.    Procedure:   Lumbar Facet Block  under fluoroscopic guidance    Physician/Practitioner performing the procedure:   Seaborn Nakama A. Laban Emperor MD    Indication/Reason:   Low Back Pain, with our without leg pain, due to Facet Joint Arthralgia (Joint Pain) Spondylosis (Arthritis of the Spine), without myelopathy or radiculopathy (Nerve Damage).   Provide equipment / supplies at bedside    Procedure tray: "Block Tray" (Disposable  single use) Skin infiltration needle: Regular 1.5-in, 25-G, (x1) Block Needle type: Spinal Amount/quantity: 4 Size: Regular (3.5-inch) Gauge: 22G    Standing Status:   Standing    Number of Occurrences:   1    Specify:   Block Tray   Saline lock IV    Have LR [phone] mL available and administer at 125 mL/hr if patient becomes hypotensive.    Standing Status:   Standing    Number of Occurrences:   1   Miscellanous precautions    Standing Status:   Standing    Number of Occurrences:   1   Chronic Opioid Analgesic:   Hydrocodone/APAP 10/325, 1 tab PO q 8 hrs PRN (30 mg/day of hydrocodone) (975 mg/day of acetaminophen) MME/day: 30 mg/day.   Medications ordered for procedure: Meds ordered this encounter  Medications   lidocaine (XYLOCAINE) 2 % (  with pres) injection 400 mg   pentafluoroprop-tetrafluoroeth (GEBAUERS) aerosol   midazolam (VERSED) 5 MG/5ML injection 0.5-2 mg    Make sure Flumazenil is available in the pyxis when using this medication. If oversedation occurs, administer 0.2 mg IV over 15 sec. If after 45 sec no response, administer 0.2 mg again over 1 min; may repeat at 1 min intervals; not to exceed 4 doses (1 mg)   fentaNYL (SUBLIMAZE) injection 25-50 mcg    Make sure Narcan is available in the pyxis when using this medication. In the event of respiratory depression (RR< 8/min): Titrate NARCAN (naloxone) in increments of 0.1 to 0.2 mg IV at 2-3 minute intervals, until desired degree of reversal.   ropivacaine (PF) 2  mg/mL (0.2%) (NAROPIN) injection 18 mL   triamcinolone acetonide (KENALOG-40) injection 80 mg   Medications administered: Braxton Feathers had no medications administered during this visit.  See the medical record for exact dosing, route, and time of administration.  Follow-up plan:   Return in about 2 weeks (around 01/30/2024) for (Face2F), (PPE).       Interventional Therapies  Risk  Complexity Considerations:   Allergy: CONTRAST  COPD  BA  CKD  SOBOE  GERD  Jehovah's witness  Hx. intestinal obstruction  CHF  HTN  DM  MO (BMI>30)  No RFA until patient brings BMI down to less than 30 kg/m   Planned  Pending:   Therapeutic bilateral lumbar facet MBB #4    Under consideration:   Therapeutic bilateral lumbar facet MBB #4  Possible bilateral lumbar facet RFA #1 (on hold until BMI is brought down to 30 kg/m)   Completed:   Diagnostic left IA shoulder and AC joint inj. x1 (10/08/2023) (100/100/60/60)  Diagnostic bilateral lumbar facet MBB x3 (01/18/2022) (5/10 to 0/10) (100/100/75/75-100)    Therapeutic  Palliative (PRN) options:   Therapeutic/palliative bilateral lumbar facet MBB   Pharmacotherapy  Nonopioids transferred 11/09/2020: Magnesium, Lyrica, and Mobic.       Recent Visits Date Type Provider Dept  12/30/23 Office Visit Delano Metz, MD [facility]  Showing recent visits within past 90 days and meeting all other requirements Today's Visits Date Type Provider Dept  01/16/24 Procedure visit Delano Metz, MD [facility]  Showing today's visits and meeting all other requirements Future Appointments Date Type Provider Dept  03/30/24 Appointment Delano Metz, MD [facility]  Showing future appointments within next 90 days and meeting all other requirements  Disposition: Discharge home  Discharge (Date  Time): 01/16/2024;   hrs.   Primary Care Physician: Leanna Sato, MD Location: [facility] Note by: Oswaldo Done, MD (TTS technology used. I apologize for any typographical errors that were not detected and corrected.) Date: 01/16/2024; Time: 11:05 AM  Disclaimer:  Medicine is not an Visual merchandiser. The only guarantee in medicine is that nothing is guaranteed. It is important to note that the decision to proceed with this intervention was based on the information collected from the patient. The Data and conclusions were drawn from the patient's questionnaire, the interview, and the physical examination. Because the information was provided in large part by the patient, it cannot be guaranteed that it has not been purposely or unconsciously manipulated. Every effort has been made to obtain as much relevant data as possible for this evaluation. It is important to note that the conclusions that lead to this procedure are derived in large part from the available data. Always take into  account that the treatment will also be dependent on availability of resources and existing treatment guidelines, considered by other Pain Management Practitioners as being common knowledge and practice, at the time of the intervention. For Medico-Legal purposes, it is also important to point out that variation in procedural techniques and pharmacological choices are the acceptable norm. The indications, contraindications, technique, and results of the above procedure should only be interpreted and judged by a Board-Certified Interventional Pain Specialist with extensive familiarity and expertise in the same exact procedure and technique.

## 2024-02-03 NOTE — Progress Notes (Unsigned)
 PROVIDER NOTE: Information contained herein reflects review and annotations entered in association with encounter. Interpretation of such information and data should be left to medically-trained personnel. Information provided to patient can be located elsewhere in the medical record under "Patient Instructions". Document created using STT-dictation technology, any transcriptional errors that may result from process are unintentional.    Patient: Mariah Poole  Service Category: E/M  Provider: Oswaldo Done, MD  DOB: 02/26/1946  DOS: 02/04/2024  Referring Provider: Leanna Sato, MD  MRN: [id number]  Specialty: Interventional Pain Management  PCP: Leanna Sato, MD  Type: Established Patient  Setting: Ambulatory outpatient    Location: Office  Delivery: Face-to-face     HPI  [name], a 78 y.o. year old female, is here today because of her Chronic bilateral low back pain without sciatica [M54.50, G89.29]. [name] primary complain today is No chief complaint on file.  Pertinent problems: 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, L4-5, L5-S1) w/ neurogenic claudication; Lumbar foraminal stenosis (Multilevel) (Bilateral); Cervicalgia; DDD (degenerative disc disease), cervical; Cervical facet hypertrophy; Cervical facet joint syndrome; Lumbar facet hypertrophy; 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); Deformity of hands due to rheumatoid arthritis (HCC) (Bilateral); Chronic hand pain, right; Primary osteoarthritis of both hands; Neurogenic pain; Rheumatoid arthritis involving both hands with positive  rheumatoid factor (HCC); Chronic musculoskeletal pain; Bilateral leg edema; Chronic fatigue; Edema, lower extremity; Rheumatoid arthritis involving multiple sites with positive rheumatoid factor (HCC); Osteoarthritis of hip (Right); Lumbar facet joint pain; Chronic shoulder pain (Left); Rheumatoid arthritis of shoulder (HCC) (Left); Decreased range of motion of shoulder (Left); Abnormal x-ray of lumbar spine (04/21/2023); Grade 1 Anterolisthesis of lumbosacral spine (L5/S1); and DDD (degenerative disc disease), lumbar w/ LBP on their pertinent problem list. Pain Assessment: Severity of   is reported as a  /10. Location:    / . Onset:  . Quality:  . Timing:  . Modifying factor(s):  Marland Kitchen Vitals:  vitals were not taken for this visit.  BMI: Estimated body mass index is 33.59 kg/m as calculated from the following:   Height as of 01/16/24: 5' (1.524 m).   Weight as of 01/16/24: 172 lb (78 kg). Last encounter: 12/30/2023. Last procedure: 01/16/2024.  Reason for encounter: post-procedure evaluation and assessment. ***  Discussed the use of AI scribe software for clinical note transcription with the patient, who gave verbal consent to proceed.  History of Present Illness           Pharmacotherapy Assessment  Analgesic: Hydrocodone/APAP 10/325, 1 tab PO q 8 hrs PRN (30 mg/day of hydrocodone) (975 mg/day of acetaminophen) MME/day: 30 mg/day.   Monitoring: [place] PMP: PDMP reviewed during this encounter.       Pharmacotherapy: No side-effects or adverse reactions reported. Compliance: No problems identified. Effectiveness: Clinically acceptable.  No notes on file  No results found for: "CBDTHCR" No results found for: "D8THCCBX" No results found for: "D9THCCBX"  UDS:  Summary  Date Value Ref Range Status  07/10/2023 Note  Final    Comment:    ==================================================================== [employer] [facility] [employer]  (MW) ==================================================================== Test  Result       Flag       Units  Drug Present and Declared for Prescription Verification   Hydrocodone                    2209         EXPECTED   ng/mg creat   Hydromorphone                  439          EXPECTED   ng/mg creat   Dihydrocodeine                 596          EXPECTED   ng/mg creat   Norhydrocodone                 4188         EXPECTED   ng/mg creat    Sources of hydrocodone include scheduled prescription medications.    Hydromorphone, dihydrocodeine and norhydrocodone are expected    metabolites of hydrocodone. Hydromorphone and dihydrocodeine are    also available as scheduled prescription medications.  ==================================================================== Test                      Result    Flag   Units      Ref Range   Creatinine              57               mg/dL      >=16 ==================================================================== Declared Medications:  The flagging and interpretation on this report are based on the  following declared medications.  Unexpected results may arise from  inaccuracies in the declared medications.   **Note: The testing scope of this panel includes these medications:   Hydrocodone (Norco)   **Note: The testing scope of this panel does not include the  following reported medications:   Acetaminophen (Norco)  Albuterol (Ventolin HFA)  Atenolol (Tenormin)  Budesonide (Symbicort)  Duloxetine (Cymbalta)  Famotidine (Pepcid)  Formoterol (Symbicort)  Furosemide (Lasix)  Iron  Losartan (Cozaar)  Magnesium (Mag-Ox)  Meloxicam (Mobic)  Montelukast (Singulair)  Naloxone (Narcan)  Pantoprazole (Protonix)  Potassium (Klor-Con)  Pravastatin (Pravachol)  Semaglutide (Ozempic)  Spironolactone (Aldactone)  Topical Lidocaine  Turmeric  Vitamin C  Vitamin  D3 ==================================================================== For clinical consultation, please call [phone]. ====================================================================       ROS  Constitutional: Denies any fever or chills Gastrointestinal: No reported hemesis, hematochezia, vomiting, or acute GI distress Musculoskeletal: Denies any acute onset joint swelling, redness, loss of ROM, or weakness Neurological: No reported episodes of acute onset apraxia, aphasia, dysarthria, agnosia, amnesia, paralysis, loss of coordination, or loss of consciousness  Medication Review  DULoxetine, HYDROcodone-acetaminophen, Lidocaine, Semaglutide(0.25 or 0.5MG /DOS), Turmeric Curcumin, Vitamin D3, albuterol, ascorbic acid, atenolol, baclofen, budesonide-formoterol, famotidine, ferrous sulfate, fluticasone furoate-vilanterol, furosemide, losartan, magnesium oxide, meloxicam, montelukast, naloxone, pantoprazole, potassium chloride SA, pravastatin, and spironolactone  History Review  Allergy: [name] is allergic to contrast media [iodinated contrast media], ioxaglate, and shellfish allergy. Drug: [name]  reports current drug use. Alcohol:  reports no history of alcohol use. Tobacco:  reports that she has never smoked. She has never used smokeless tobacco. Social: [name]  reports that she has never smoked. She has never used smokeless tobacco. She reports current drug use. She reports that she does not drink alcohol. Medical:  has a past medical history  of Anemia, Arthritis, COPD (chronic obstructive pulmonary disease) (HCC), Depression, Diabetes mellitus without complication (HCC), GERD (gastroesophageal reflux disease), Hiatal hernia, History of contrast media allergy. (IVP dye) (09/01/2015), History of hiatal hernia, Hypercholesteremia, Hypertension, Morbid obesity (HCC), Scoliosis, and Shortness of breath dyspnea. Surgical: [name]  has a past surgical history that  includes Abdominal hysterectomy; Cataract extraction w/ intraocular lens implant (Right); Gastric bypass; Hernia repair; Cataract extraction w/PHACO (Left, 07/19/2015); Cholecystectomy; and Hernia repair. Family: family history includes Diabetes in her mother; Heart disease in her father.  Laboratory Chemistry Profile   Renal Lab Results  Component Value Date   BUN 14 09/14/2021   CREATININE 1.02 (H) 09/14/2021   BCR 21 06/17/2019   GFRAA 53 (L) 06/17/2019   GFRNONAA 57 (L) 09/14/2021    Hepatic Lab Results  Component Value Date   AST 19 09/14/2021   ALT 11 09/14/2021   ALBUMIN 3.8 09/14/2021   ALKPHOS 66 09/14/2021   LIPASE 19 10/27/2015    Electrolytes Lab Results  Component Value Date   NA 140 09/14/2021   K 4.3 09/14/2021   CL 102 09/14/2021   CALCIUM 9.3 09/14/2021   MG 1.9 06/17/2019    Bone Lab Results  Component Value Date   25OHVITD1 45 06/17/2019   25OHVITD2 <1.0 06/17/2019   25OHVITD3 45 06/17/2019    Inflammation (CRP: Acute Phase) (ESR: Chronic Phase) Lab Results  Component Value Date   CRP 7 06/17/2019   ESRSEDRATE 49 (H) 06/17/2019   LATICACIDVEN 4.0 (H) 11/23/2009         Note: Above Lab results reviewed.  Recent Imaging Review  DG PAIN CLINIC C-ARM 1-60 MIN NO REPORT Fluoro was used, but no Radiologist interpretation will be provided.  Please refer to "NOTES" tab for provider progress note. Note: Reviewed        Physical Exam  General appearance: Well nourished, well developed, and well hydrated. In no apparent acute distress Mental status: Alert, oriented x 3 (person, place, & time)       Respiratory: No evidence of acute respiratory distress Eyes: PERLA Vitals: There were no vitals taken for this visit. BMI: Estimated body mass index is 33.59 kg/m as calculated from the following:   Height as of 01/16/24: 5' (1.524 m).   Weight as of 01/16/24: 172 lb (78 kg). Ideal: Patient weight not recorded  Assessment   Diagnosis Status  1.  Chronic low back pain (1ry area of Pain) (Bilateral) (R>L) w/o sciatica   2. Lumbar facet joint pain   3. Lumbar facet syndrome (Bilateral) (R>L)   4. Postop check    Controlled Controlled Controlled   Updated Problems: No problems updated.  Plan of Care  Problem-specific:  Assessment and Plan            [name] has a current medication list which includes the following long-term medication(s): albuterol, atenolol, duloxetine, famotidine, ferosul, furosemide, hydrocodone-acetaminophen, [START ON 02/05/2024] hydrocodone-acetaminophen, [START ON 03/06/2024] hydrocodone-acetaminophen, montelukast, pantoprazole, potassium chloride sa, pravastatin, spironolactone, and symbicort.  Pharmacotherapy (Medications Ordered): No orders of the defined types were placed in this encounter.  Orders:  No orders of the defined types were placed in this encounter.  Follow-up plan:   No follow-ups on file.      Interventional Therapies  Risk  Complexity Considerations:   Allergy: CONTRAST  COPD  BA  CKD  SOBOE  GERD  Jehovah's witness  Hx. intestinal obstruction  CHF  HTN  DM  MO (BMI>30)  No RFA  until patient brings BMI down to less than 30 kg/m   Planned  Pending:   Therapeutic bilateral lumbar facet MBB #4    Under consideration:   Therapeutic bilateral lumbar facet MBB #4  Possible bilateral lumbar facet RFA #1 (on hold until BMI is brought down to 30 kg/m)   Completed:   Diagnostic left IA shoulder and AC joint inj. x1 (10/08/2023) (100/100/60/60)  Diagnostic bilateral lumbar facet MBB x3 (01/18/2022) (5/10 to 0/10) (100/100/75/75-100)    Therapeutic  Palliative (PRN) options:   Therapeutic/palliative bilateral lumbar facet MBB   Pharmacotherapy  Nonopioids transferred 11/09/2020: Magnesium, Lyrica, and Mobic.       Recent Visits Date Type Provider Dept  01/16/24 Procedure visit Delano Metz, MD [facility]  12/30/23 Office Visit  Delano Metz, MD [facility]  Showing recent visits within past 90 days and meeting all other requirements Future Appointments Date Type Provider Dept  02/04/24 Appointment Delano Metz, MD [facility]  03/30/24 Appointment Delano Metz, MD [facility]  Showing future appointments within next 90 days and meeting all other requirements  I discussed the assessment and treatment plan with the patient. The patient was provided an opportunity to ask questions and all were answered. The patient agreed with the plan and demonstrated an understanding of the instructions.  Patient advised to call back or seek an in-person evaluation if the symptoms or condition worsens.  Duration of encounter: *** minutes.  Total time on encounter, as per AMA guidelines included both the face-to-face and non-face-to-face time personally spent by the physician and/or other qualified health care professional(s) on the day of the encounter (includes time in activities that require the physician or other qualified health care professional and does not include time in activities normally performed by clinical staff). Physician's time may include the following activities when performed: Preparing to see the patient (e.g., pre-charting review of records, searching for previously ordered imaging, lab work, and nerve conduction tests) Review of prior analgesic pharmacotherapies. Reviewing PMP Interpreting ordered tests (e.g., lab work, imaging, nerve conduction tests) Performing post-procedure evaluations, including interpretation of diagnostic procedures Obtaining and/or reviewing separately obtained history Performing a medically appropriate examination and/or evaluation Counseling and educating the patient/family/caregiver Ordering medications, tests, or procedures Referring and communicating with other health care professionals (when not separately reported) Documenting  clinical information in the electronic or other health record Independently interpreting results (not separately reported) and communicating results to the patient/ family/caregiver Care coordination (not separately reported)  Note by: Oswaldo Done, MD Date: 02/04/2024; Time: 7:34 AM

## 2024-02-04 ENCOUNTER — Ambulatory Visit: Payer: Medicare PPO | Attending: Pain Medicine | Admitting: Pain Medicine

## 2024-02-04 ENCOUNTER — Encounter: Payer: Self-pay | Admitting: Pain Medicine

## 2024-02-04 VITALS — BP 132/85 | HR 115 | Temp 96.4°F | Resp 16 | Ht 60.0 in | Wt 172.0 lb

## 2024-02-04 DIAGNOSIS — M47816 Spondylosis without myelopathy or radiculopathy, lumbar region: Secondary | ICD-10-CM | POA: Diagnosis not present

## 2024-02-04 DIAGNOSIS — M5459 Other low back pain: Secondary | ICD-10-CM | POA: Diagnosis not present

## 2024-02-04 DIAGNOSIS — G8929 Other chronic pain: Secondary | ICD-10-CM | POA: Diagnosis not present

## 2024-02-04 DIAGNOSIS — Z09 Encounter for follow-up examination after completed treatment for conditions other than malignant neoplasm: Secondary | ICD-10-CM | POA: Diagnosis not present

## 2024-02-04 DIAGNOSIS — M545 Low back pain, unspecified: Secondary | ICD-10-CM | POA: Diagnosis not present

## 2024-02-04 NOTE — Progress Notes (Signed)
 Safety precautions to be maintained throughout the outpatient stay will include: orient to surroundings, keep bed in low position, maintain call bell within reach at all times, provide assistance with transfer out of bed and ambulation.

## 2024-03-02 ENCOUNTER — Ambulatory Visit: Attending: Pain Medicine | Admitting: Pain Medicine

## 2024-03-02 ENCOUNTER — Encounter: Payer: Self-pay | Admitting: Pain Medicine

## 2024-03-02 VITALS — BP 146/84 | HR 85 | Temp 97.4°F | Ht <= 58 in | Wt 172.0 lb

## 2024-03-02 DIAGNOSIS — M47816 Spondylosis without myelopathy or radiculopathy, lumbar region: Secondary | ICD-10-CM | POA: Insufficient documentation

## 2024-03-02 DIAGNOSIS — M5459 Other low back pain: Secondary | ICD-10-CM | POA: Diagnosis present

## 2024-03-02 DIAGNOSIS — M5136 Other intervertebral disc degeneration, lumbar region with discogenic back pain only: Secondary | ICD-10-CM | POA: Diagnosis present

## 2024-03-02 DIAGNOSIS — G8929 Other chronic pain: Secondary | ICD-10-CM | POA: Diagnosis present

## 2024-03-02 DIAGNOSIS — M4317 Spondylolisthesis, lumbosacral region: Secondary | ICD-10-CM | POA: Diagnosis present

## 2024-03-02 DIAGNOSIS — M47817 Spondylosis without myelopathy or radiculopathy, lumbosacral region: Secondary | ICD-10-CM | POA: Diagnosis present

## 2024-03-02 DIAGNOSIS — M0579 Rheumatoid arthritis with rheumatoid factor of multiple sites without organ or systems involvement: Secondary | ICD-10-CM | POA: Insufficient documentation

## 2024-03-02 DIAGNOSIS — M545 Low back pain, unspecified: Secondary | ICD-10-CM | POA: Diagnosis present

## 2024-03-02 NOTE — Patient Instructions (Signed)

## 2024-03-02 NOTE — Progress Notes (Signed)
 Safety precautions to be maintained throughout the outpatient stay will include: orient to surroundings, keep bed in low position, maintain call bell within reach at all times, provide assistance with transfer out of bed and ambulation.

## 2024-03-02 NOTE — Progress Notes (Signed)
 PROVIDER NOTE: Interpretation of information contained herein should be left to medically-trained personnel. Specific patient instructions are provided elsewhere under "Patient Instructions" section of medical record. This document was created in part using AI and STT-dictation technology, any transcriptional errors that may result from this process are unintentional.  Patient: Mariah Poole  Service: E/M   PCP: Leanna Sato, MD  DOB: Sep 30, 1946  DOS: 03/02/2024  Provider: Oswaldo Done, MD  MRN: [id number]  Delivery: Face-to-face  Specialty: Interventional Pain Management  Type: Established Patient  Setting: Ambulatory outpatient facility  Specialty designation: 09  Referring Prov.: Leanna Sato, MD  Location: Outpatient office facility       HPI  [name], a 78 y.o. year old female, is here today because of her Chronic bilateral low back pain without sciatica [M54.50, G89.29]. [name] primary complain today is Back Pain (lower)  Pertinent problems: 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, L4-5, L5-S1) w/ neurogenic claudication; Lumbar foraminal stenosis (Multilevel) (Bilateral); Cervicalgia; DDD (degenerative disc disease), cervical; Cervical facet hypertrophy; Cervical facet joint syndrome; Lumbar facet hypertrophy; 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); Deformity of hands due to rheumatoid arthritis (HCC) (Bilateral); Chronic hand pain, right; Primary osteoarthritis of both hands; Neurogenic pain; Rheumatoid arthritis involving both hands with positive rheumatoid factor (HCC); Chronic  musculoskeletal pain; Bilateral leg edema; Chronic fatigue; Edema, lower extremity; Rheumatoid arthritis involving multiple sites with positive rheumatoid factor (HCC); Osteoarthritis of hip (Right); Lumbar facet joint pain; Chronic shoulder pain (Left); Rheumatoid arthritis of shoulder (HCC) (Left); Decreased range of motion of shoulder (Left); Abnormal x-ray of lumbar spine (04/21/2023); Grade 1 Anterolisthesis of lumbosacral spine (L5/S1); and DDD (degenerative disc disease), lumbar w/ LBP on their pertinent problem list. Pain Assessment: Severity of Chronic pain is reported as a 4 /10. Location: Back Left, Right/Denies. Onset: More than a month ago. Quality: Aching, Burning, Constant. Timing: Constant. Modifying factor(s): meds and injections. Vitals:  height is 4' 1.32" (1.253 m) and weight is 172 lb (78 kg). Her temperature is 97.4 F (36.3 C) (abnormal). Her blood pressure is 146/84 (abnormal) and her Poole is 85. Her oxygen saturation is 99%.  BMI: Estimated body mass index is 49.71 kg/m as calculated from the following:   Height as of this encounter: 4' 1.32" (1.253 m).   Weight as of this encounter: 172 lb (78 kg). Last encounter: 02/04/2024. Last procedure: 01/16/2024.  Reason for encounter: evaluation of worsening, or previously known (established) problem.  Patient requested evaluation for possible interventional therapies.  Discussed the use of AI scribe software for clinical note transcription with the patient, who gave verbal consent to proceed.  History of Present Illness   Mariah Poole is a 78 year old female with back pain and rheumatoid arthritis who presents with recurrence of back pain.  She experiences a recurrence of back pain affecting both sides, localized to the buttocks and hip area, without radiation down the legs. No numbness or weakness below the knees. She has previously undergone treatments including injections, which were helpful, and is scheduled for another  session with sedation.  She also has symptoms related to rheumatoid arthritis, particularly in her hands and shoulders. She describes intermittent  pain and stiffness, with some days being unable to function or grip objects effectively. The symptoms are not constant, but she reports episodes of being 'nonfunctional,' especially in the mornings. The left hand is more affected at times. She is unsure if previous back injections have provided any relief for her hand symptoms.     RTCB: 04/05/2024 (scheduled for 03/30/2024)  Pharmacotherapy Assessment  Analgesic: Hydrocodone/APAP 10/325, 1 tab PO q 8 hrs PRN (30 mg/day of hydrocodone) (975 mg/day of acetaminophen) MME/day: 30 mg/day.    Monitoring: [place] PMP: PDMP reviewed during this encounter.       Pharmacotherapy: No side-effects or adverse reactions reported. Compliance: No problems identified. Effectiveness: Clinically acceptable.  Mariah Pulse, RN  03/02/2024 10:57 AM  Sign when Signing Visit Safety precautions to be maintained throughout the outpatient stay will include: orient to surroundings, keep bed in low position, maintain call bell within reach at all times, provide assistance with transfer out of bed and ambulation.     No results found for: "CBDTHCR" No results found for: "D8THCCBX" No results found for: "D9THCCBX"  UDS:  Summary  Date Value Ref Range Status  07/10/2023 Note  Final    Comment:    ==================================================================== [employer] [facility] (MW) ==================================================================== Test                             Result       Flag       Units  Drug Present and Declared for Prescription Verification   Hydrocodone                    2209         EXPECTED   ng/mg creat   Hydromorphone                  439          EXPECTED   ng/mg creat   Dihydrocodeine                 596          EXPECTED   ng/mg creat   Norhydrocodone                 4188          EXPECTED   ng/mg creat    Sources of hydrocodone include scheduled prescription medications.    Hydromorphone, dihydrocodeine and norhydrocodone are expected    metabolites of hydrocodone. Hydromorphone and dihydrocodeine are    also available as scheduled prescription medications.  ==================================================================== Test                      Result    Flag   Units      Ref Range   Creatinine              57               mg/dL      >=95 ==================================================================== Declared Medications:  The flagging and interpretation on this report are based on the  following declared medications.  Unexpected results may arise from  inaccuracies in the declared medications.   **Note: The testing scope of this panel includes these medications:   Hydrocodone (Norco)   **Note: The testing scope of this panel does not include the  following reported medications:   Acetaminophen (Norco)  Albuterol (Ventolin HFA)  Atenolol (Tenormin)  Budesonide (Symbicort)  Duloxetine (Cymbalta)  Famotidine (Pepcid)  Formoterol (Symbicort)  Furosemide (Lasix)  Iron  Losartan (Cozaar)  Magnesium (Mag-Ox)  Meloxicam (Mobic)  Montelukast (Singulair)  Naloxone (Narcan)  Pantoprazole (Protonix)  Potassium (Klor-Con)  Pravastatin (Pravachol)  Semaglutide (Ozempic)  Spironolactone (Aldactone)  Topical Lidocaine  Turmeric  Vitamin C  Vitamin D3 ==================================================================== For clinical consultation, please call [phone]. ====================================================================       ROS  Constitutional: Denies any fever or chills Gastrointestinal: No reported hemesis, hematochezia, vomiting, or acute GI distress Musculoskeletal: Denies any acute onset joint swelling, redness, loss of ROM, or weakness Neurological: No reported episodes of acute onset apraxia, aphasia,  dysarthria, agnosia, amnesia, paralysis, loss of coordination, or loss of consciousness  Medication Review  DULoxetine, HYDROcodone-acetaminophen, Lidocaine, Semaglutide(0.25 or 0.5MG /DOS), Turmeric Curcumin, Vitamin D3, albuterol, ascorbic acid, atenolol, baclofen, budesonide-formoterol, famotidine, ferrous sulfate, fluticasone furoate-vilanterol, furosemide, losartan, magnesium oxide, meloxicam, montelukast, naloxone, pantoprazole, potassium chloride SA, pravastatin, and spironolactone  History Review  Allergy: Mariah Poole is allergic to contrast media [iodinated contrast media], ioxaglate, and shellfish allergy. Drug: Mariah Poole  reports current drug use. Alcohol:  reports no history of alcohol use. Tobacco:  reports that she has never smoked. She has never used smokeless tobacco. Social: Mariah Poole  reports that she has never smoked. She has never used smokeless tobacco. She reports current drug use. She reports that she does not drink alcohol. Medical:  has a past medical history of Anemia, Arthritis, COPD (chronic obstructive pulmonary disease) (HCC), Depression, Diabetes mellitus without complication (HCC), GERD (gastroesophageal reflux disease), Hiatal hernia, History of contrast media allergy. (IVP dye) (09/01/2015), History of hiatal hernia, Hypercholesteremia, Hypertension, Morbid obesity (HCC), Scoliosis, and Shortness of breath dyspnea. Surgical: Mariah Poole  has a past surgical history that includes Abdominal hysterectomy; Cataract extraction w/ intraocular lens implant (Right); Gastric bypass; Hernia repair; Cataract extraction w/PHACO (Left, 07/19/2015); Cholecystectomy; and Hernia repair. Family: family history includes Diabetes in her mother; Heart disease in her father.  Laboratory Chemistry Profile   Renal Lab Results  Component Value Date   BUN 14 09/14/2021   CREATININE 1.02 (H) 09/14/2021   BCR 21 06/17/2019   GFRAA 53 (L) 06/17/2019   GFRNONAA 57 (L) 09/14/2021     Hepatic Lab Results  Component Value Date   AST 19 09/14/2021   ALT 11 09/14/2021   ALBUMIN 3.8 09/14/2021   ALKPHOS 66 09/14/2021   LIPASE 19 10/27/2015    Electrolytes Lab Results  Component Value Date   NA 140 09/14/2021   K 4.3 09/14/2021   CL 102 09/14/2021   CALCIUM 9.3 09/14/2021   MG 1.9 06/17/2019    Bone Lab Results  Component Value Date   25OHVITD1 45 06/17/2019   25OHVITD2 <1.0 06/17/2019   25OHVITD3 45 06/17/2019    Inflammation (CRP: Acute Phase) (ESR: Chronic Phase) Lab Results  Component Value Date   CRP 7 06/17/2019   ESRSEDRATE 49 (H) 06/17/2019   LATICACIDVEN 4.0 (H) 11/23/2009         Note: Above Lab results reviewed.  Recent Imaging Review  DG PAIN CLINIC C-ARM 1-60 MIN NO REPORT Fluoro was used, but no Radiologist interpretation will be provided.  Please refer to "NOTES" tab for provider progress note. Note: Reviewed        Physical Exam  General appearance: Well nourished, well developed, and well hydrated. In no apparent acute distress Mental status: Alert, oriented x 3 (person, place, & time)       Respiratory: No evidence of acute respiratory distress  Eyes: PERLA Vitals: BP (!) 146/84   Poole 85   Temp (!) 97.4 F (36.3 C)   Ht 4' 1.32" (1.253 m)   Wt 172 lb (78 kg)   SpO2 99%   BMI 49.71 kg/m  BMI: Estimated body mass index is 49.71 kg/m as calculated from the following:   Height as of this encounter: 4' 1.32" (1.253 m).   Weight as of this encounter: 172 lb (78 kg). Ideal: Patient must be at least 60 in tall to calculate ideal body weight  Assessment   Diagnosis Status  1. Chronic low back pain (1ry area of Pain) (Bilateral) (R>L) w/o sciatica   2. Rheumatoid arthritis involving multiple sites with positive rheumatoid factor (HCC)   3. 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 DDD (degenerative disc disease), lumbar w/ LBP   5. Lumbar facet hypertrophy   6. Lumbar facet joint pain   7. Lumbar facet  syndrome (Bilateral) (R>L)   8. Spondylosis without myelopathy or radiculopathy, lumbosacral region    Having a Flare-up Controlled Persistent   Updated Problems: No problems updated.  Plan of Care  Problem-specific:  Assessment and Plan    Chronic back pain   She experiences recurrent bilateral back pain localized to the back and buttocks, without radiation or numbness or weakness below the knees. Previous injections have been effective. Discussed potential systemic effects of steroid injections, which may relieve other joint pain, though the primary focus is back pain. Schedule a back injection with sedation as previously done. Monitor for improvement in other joint pain following the back injection.  Rheumatoid arthritis   Rheumatoid arthritis affects her hands, causing intermittent pain and functional impairment, especially in the left hand, with difficulty gripping and opening objects. Discussed the potential for systemic effects of steroid injections for back pain to relieve hand and shoulder pain, though the primary focus is back pain. Monitor for improvement in hand and shoulder pain following the back injection.       Mariah Poole has a current medication list which includes the following long-term medication(s): albuterol, atenolol, duloxetine, famotidine, ferosul, furosemide, hydrocodone-acetaminophen, [START ON 03/06/2024] hydrocodone-acetaminophen, montelukast, pantoprazole, potassium chloride sa, pravastatin, symbicort, and spironolactone.  Pharmacotherapy (Medications Ordered): No orders of the defined types were placed in this encounter.  Orders:  Orders Placed This Encounter  Procedures   LUMBAR FACET(MEDIAL BRANCH NERVE BLOCK) MBNB    Diagnosis: Lumbar Facet Syndrome (M47.816); Lumbosacral Facet Syndrome (M47.817); Lumbar Facet Joint Pain (M54.59) Medical Necessity Statement: 1.Severe chronic axial low back pain causing functional impairment documented by  ongoing pain scale assessments. 2.Pain present for longer than 3 months (Chronic) documented to have failed noninvasive conservative therapies. 3.Absence of untreated radiculopathy. 4.There is no radiological evidence of untreated fractures, tumor, infection, or deformity.  Physical Examination Findings: Positive Kemp Maneuver: (Y)  Positive Lumbar Hyperextension-Rotation provocative test: (Y)    Standing Status:   Future    Expiration Date:   06/01/2024    Scheduling Instructions:     Procedure: Lumbar facet Block     Type: Medial Branch Block     Side: Bilateral     Purpose: Diagnostic/Therapeutic     Level(s): L3-4, L4-5, L5-S1, and TBD by Fluoroscopic Mapping Facets (L2, L3, L4, L5, S1, and TBD Medial Branch)     Sedation: With Sedation.     Timeframe: As soon as schedule allows.    Where will this procedure be performed?:   [facility]   Follow-up  plan:   Return for (ECT): (B) L-FCT Blk #5.     Interventional Therapies  Risk  Complexity Considerations:   Allergy: CONTRAST  COPD  BA  CKD  SOBOE  GERD  Jehovah's witness  Hx. intestinal obstruction  CHF  HTN  DM  MO (BMI>30)  No RFA until patient brings BMI down to less than 30 kg/m   Planned  Pending:   Therapeutic bilateral lumbar facet MBB #5    Under consideration:   Therapeutic bilateral lumbar facet MBB #4  Possible bilateral lumbar facet RFA #1 (on hold until BMI is brought down to 30 kg/m)   Completed:   Diagnostic left IA shoulder and AC joint inj. x1 (10/08/2023) (100/100/60/60)  Diagnostic bilateral lumbar facet MBB x3 (01/18/2022) (5/10 to 0/10) (100/100/75/75-100)    Therapeutic  Palliative (PRN) options:   Therapeutic/palliative bilateral lumbar facet MBB   Pharmacotherapy  Nonopioids transferred 11/09/2020: Magnesium, Lyrica, and Mobic.     Recent Visits Date Type Provider Dept  02/04/24 Office Visit Renaldo Caroli, MD [facility]  01/16/24 Procedure visit  Renaldo Caroli, MD [facility]  12/30/23 Office Visit Renaldo Caroli, MD [facility]  Showing recent visits within past 90 days and meeting all other requirements Today's Visits Date Type Provider Dept  03/02/24 Office Visit Renaldo Caroli, MD [facility]  Showing today's visits and meeting all other requirements Future Appointments Date Type Provider Dept  03/30/24 Appointment Renaldo Caroli, MD [facility]  Showing future appointments within next 90 days and meeting all other requirements  I discussed the assessment and treatment plan with the patient. The patient was provided an opportunity to ask questions and all were answered. The patient agreed with the plan and demonstrated an understanding of the instructions.  Patient advised to call back or seek an in-person evaluation if the symptoms or condition worsens.  Duration of encounter: 31 minutes.  Total time on encounter, as per AMA guidelines included both the face-to-face and non-face-to-face time personally spent by the physician and/or other qualified health care professional(s) on the day of the encounter (includes time in activities that require the physician or other qualified health care professional and does not include time in activities normally performed by clinical staff). Physician's time may include the following activities when performed: Preparing to see the patient (e.g., pre-charting review of records, searching for previously ordered imaging, lab work, and nerve conduction tests) Review of prior analgesic pharmacotherapies. Reviewing PMP Interpreting ordered tests (e.g., lab work, imaging, nerve conduction tests) Performing post-procedure evaluations, including interpretation of diagnostic procedures Obtaining and/or reviewing separately obtained history Performing a medically appropriate examination and/or evaluation Counseling and educating the  patient/family/caregiver Ordering medications, tests, or procedures Referring and communicating with other health care professionals (when not separately reported) Documenting clinical information in the electronic or other health record Independently interpreting results (not separately reported) and communicating results to the patient/ family/caregiver Care coordination (not separately reported)  Note by: Candi Chafe, MD (TTS and AI technology used. I apologize for any typographical errors that were not detected and corrected.) Date: 03/02/2024; Time: 11:56 AM

## 2024-03-09 ENCOUNTER — Ambulatory Visit: Admitting: Pain Medicine

## 2024-03-24 ENCOUNTER — Encounter: Payer: Self-pay | Admitting: Pain Medicine

## 2024-03-24 ENCOUNTER — Ambulatory Visit
Admission: RE | Admit: 2024-03-24 | Discharge: 2024-03-24 | Disposition: A | Discharge status: Home / Self Care | Source: Ambulatory Visit | Attending: Pain Medicine | Admitting: Pain Medicine

## 2024-03-24 ENCOUNTER — Ambulatory Visit: Attending: Pain Medicine | Admitting: Pain Medicine

## 2024-03-24 VITALS — BP 147/94 | HR 92 | Temp 97.6°F | Resp 16 | Ht 60.0 in | Wt 172.0 lb

## 2024-03-24 DIAGNOSIS — M47816 Spondylosis without myelopathy or radiculopathy, lumbar region: Secondary | ICD-10-CM | POA: Diagnosis not present

## 2024-03-24 DIAGNOSIS — M5459 Other low back pain: Secondary | ICD-10-CM | POA: Insufficient documentation

## 2024-03-24 DIAGNOSIS — M545 Low back pain, unspecified: Secondary | ICD-10-CM | POA: Diagnosis present

## 2024-03-24 DIAGNOSIS — M0579 Rheumatoid arthritis with rheumatoid factor of multiple sites without organ or systems involvement: Secondary | ICD-10-CM | POA: Insufficient documentation

## 2024-03-24 DIAGNOSIS — M47817 Spondylosis without myelopathy or radiculopathy, lumbosacral region: Secondary | ICD-10-CM | POA: Diagnosis not present

## 2024-03-24 DIAGNOSIS — M4317 Spondylolisthesis, lumbosacral region: Secondary | ICD-10-CM | POA: Diagnosis present

## 2024-03-24 DIAGNOSIS — G8929 Other chronic pain: Secondary | ICD-10-CM | POA: Diagnosis not present

## 2024-03-24 DIAGNOSIS — M5136 Other intervertebral disc degeneration, lumbar region with discogenic back pain only: Secondary | ICD-10-CM | POA: Insufficient documentation

## 2024-03-24 MED ORDER — FENTANYL CITRATE (PF) 100 MCG/2ML IJ SOLN
INTRAMUSCULAR | Status: AC
  Filled 2024-03-24: qty 2

## 2024-03-24 MED ORDER — PENTAFLUOROPROP-TETRAFLUOROETH EX AERO
INHALATION_SPRAY | Freq: Once | CUTANEOUS | Status: AC
  Administered 2024-03-24: 30 via TOPICAL

## 2024-03-24 MED ORDER — MIDAZOLAM HCL 5 MG/5ML IJ SOLN
INTRAMUSCULAR | Status: AC
  Filled 2024-03-24: qty 5

## 2024-03-24 MED ORDER — MIDAZOLAM HCL 5 MG/5ML IJ SOLN
0.5000 mg | Freq: Once | INTRAMUSCULAR | Status: AC
  Administered 2024-03-24: 2 mg via INTRAVENOUS

## 2024-03-24 MED ORDER — ROPIVACAINE HCL 2 MG/ML IJ SOLN
INTRAMUSCULAR | Status: AC
  Filled 2024-03-24: qty 20

## 2024-03-24 MED ORDER — ROPIVACAINE HCL 2 MG/ML IJ SOLN
18.0000 mL | Freq: Once | INTRAMUSCULAR | Status: AC
  Administered 2024-03-24: 18 mL via PERINEURAL

## 2024-03-24 MED ORDER — TRIAMCINOLONE ACETONIDE 40 MG/ML IJ SUSP
80.0000 mg | Freq: Once | INTRAMUSCULAR | Status: AC
  Administered 2024-03-24: 80 mg

## 2024-03-24 MED ORDER — LIDOCAINE HCL 2 % IJ SOLN
INTRAMUSCULAR | Status: AC
  Filled 2024-03-24: qty 20

## 2024-03-24 MED ORDER — LIDOCAINE HCL 2 % IJ SOLN
20.0000 mL | Freq: Once | INTRAMUSCULAR | Status: AC
Start: 2024-03-24 — End: 2024-03-24
  Administered 2024-03-24: 400 mg

## 2024-03-24 MED ORDER — FENTANYL CITRATE (PF) 100 MCG/2ML IJ SOLN
25.0000 ug | INTRAMUSCULAR | Status: DC | PRN
Start: 2024-03-24 — End: 2024-03-24

## 2024-03-24 MED ORDER — TRIAMCINOLONE ACETONIDE 40 MG/ML IJ SUSP
INTRAMUSCULAR | Status: AC
  Filled 2024-03-24: qty 2

## 2024-03-24 NOTE — Progress Notes (Signed)
 1257 wasted Fentanyl  and 3 mg Versed  per policy with V NeeseRN as witness.

## 2024-03-24 NOTE — Patient Instructions (Signed)

## 2024-03-24 NOTE — Progress Notes (Signed)
 PROVIDER NOTE: Interpretation of information contained herein should be left to medically-trained personnel. Specific patient instructions are provided elsewhere under "Patient Instructions" section of medical record. This document was created in part using STT-dictation technology, any transcriptional errors that may result from this process are unintentional.  Patient: Mariah Poole Type: Established DOB: 1945/12/26 MRN: [id number] PCP: Macie Saxon, MD  Service: Procedure DOS: 03/24/2024 Setting: Ambulatory Location: Ambulatory outpatient facility Delivery: Face-to-face Provider: Candi Chafe, MD Specialty: Interventional Pain Management Specialty designation: 09 Location: Outpatient facility Ref. Prov.: Renaldo Caroli, MD       Interventional Therapy   Type: Lumbar Facet, Medial Branch Block(s)   #5  Laterality: Bilateral  Level: L2, L3, L4, L5, and S1 Medial Branch Level(s). Injecting these levels blocks the L3-4, L4-5, and L5-S1 lumbar facet joints.  Imaging: Fluoroscopic guidance Spinal ([id number]) Anesthesia: Local anesthesia (1-2% Lidocaine ) Anxiolysis: IV Versed  2.0 mg Sedation: Moderate Sedation None required. No Fentanyl  administered.         DOS: 03/24/2024 Performed by: Candi Chafe, MD  Primary Purpose: Diagnostic/Therapeutic Indications: Low back pain severe enough to impact quality of life or function. 1. Chronic low back pain (1ry area of Pain) (Bilateral) (R>L) w/o sciatica   2. DDD (degenerative disc disease), lumbar w/ LBP   3. 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 Lumbar facet joint pain   5. Lumbar facet hypertrophy   6. Lumbar facet syndrome (Bilateral) (R>L)   7. Spondylosis without myelopathy or radiculopathy, lumbosacral region    NAS-11 Pain score:   Pre-procedure: 7 /10   Post-procedure: 0-No pain/10     Position / Prep / Materials:  Position: Prone  Prep solution: ChloraPrep (2% chlorhexidine gluconate and  70% isopropyl alcohol) Area Prepped: Posterolateral Lumbosacral Spine (Wide prep: From the lower border of the scapula down to the end of the tailbone and from flank to flank.)  Materials:  Tray: Block Needle(s):  Type: Spinal  Gauge (G): 22  Length: 5-in Qty: 4     H&P (Pre-op Assessment):  [name] is a 78 y.o. (year old), female patient, seen today for interventional treatment. She  has a past surgical history that includes Abdominal hysterectomy; Cataract extraction w/ intraocular lens implant (Right); Gastric bypass; Hernia repair; Cataract extraction w/PHACO (Left, 07/19/2015); Cholecystectomy; and Hernia repair. [name] has a current medication list which includes the following prescription(s): albuterol, atenolol, baclofen, vitamin d3, duloxetine, famotidine , ferosul, fluticasone  furoate-vilanterol, furosemide, lidocaine , hydrocodone -acetaminophen , losartan, magnesium  oxide, meloxicam , montelukast, naloxone , ozempic (0.25 or 0.5 mg/dose), pantoprazole , potassium chloride sa, pravastatin, symbicort , turmeric curcumin, ascorbic acid, hydrocodone -acetaminophen , and spironolactone, and the following Facility-Administered Medications: fentanyl . Her primarily concern today is the Back Pain (lower)  Initial Vital Signs:  Pulse/HCG Rate: 92ECG Heart Rate: 95 (nsr) Temp: (!) 97.1 F (36.2 C) Resp: 16 BP: 136/83 SpO2: 92 %  BMI: Estimated body mass index is 33.59 kg/m as calculated from the following:   Height as of this encounter: 5' (1.524 m).   Weight as of this encounter: 172 lb (78 kg).  Risk Assessment: Allergies: Reviewed. She is allergic to contrast media [iodinated contrast media], ioxaglate, and shellfish allergy.  Allergy Precautions: None required Coagulopathies: Reviewed. None identified.  Blood-thinner therapy: None at this time Active Infection(s): Reviewed. None identified. [name] is afebrile  Site Confirmation: [name] was asked to confirm the procedure  and laterality before marking the site Procedure checklist: Completed Consent: Before the procedure and under the influence of no sedative(s), amnesic(s), or  anxiolytics, the patient was informed of the treatment options, risks and possible complications. To fulfill our ethical and legal obligations, as recommended by the American Medical Association's Code of Ethics, I have informed the patient of my clinical impression; the nature and purpose of the treatment or procedure; the risks, benefits, and possible complications of the intervention; the alternatives, including doing nothing; the risk(s) and benefit(s) of the alternative treatment(s) or procedure(s); and the risk(s) and benefit(s) of doing nothing. The patient was provided information about the general risks and possible complications associated with the procedure. These may include, but are not limited to: failure to achieve desired goals, infection, bleeding, organ or nerve damage, allergic reactions, paralysis, and death. In addition, the patient was informed of those risks and complications associated to Spine-related procedures, such as failure to decrease pain; infection (i.e.: Meningitis, epidural or intraspinal abscess); bleeding (i.e.: epidural hematoma, subarachnoid hemorrhage, or any other type of intraspinal or peri-dural bleeding); organ or nerve damage (i.e.: Any type of peripheral nerve, nerve root, or spinal cord injury) with subsequent damage to sensory, motor, and/or autonomic systems, resulting in permanent pain, numbness, and/or weakness of one or several areas of the body; allergic reactions; (i.e.: anaphylactic reaction); and/or death. Furthermore, the patient was informed of those risks and complications associated with the medications. These include, but are not limited to: allergic reactions (i.e.: anaphylactic or anaphylactoid reaction(s)); adrenal axis suppression; blood sugar elevation that in diabetics may result in  ketoacidosis or comma; water retention that in patients with history of congestive heart failure may result in shortness of breath, pulmonary edema, and decompensation with resultant heart failure; weight gain; swelling or edema; medication-induced neural toxicity; particulate matter embolism and blood vessel occlusion with resultant organ, and/or nervous system infarction; and/or aseptic necrosis of one or more joints. Finally, the patient was informed that Medicine is not an exact science; therefore, there is also the possibility of unforeseen or unpredictable risks and/or possible complications that may result in a catastrophic outcome. The patient indicated having understood very clearly. We have given the patient no guarantees and we have made no promises. Enough time was given to the patient to ask questions, all of which were answered to the patient's satisfaction. Ms. Onufer has indicated that she wanted to continue with the procedure. Attestation: I, the ordering provider, attest that I have discussed with the patient the benefits, risks, side-effects, alternatives, likelihood of achieving goals, and potential problems during recovery for the procedure that I have provided informed consent. Date  Time: 03/24/2024  8:58 AM  Pre-Procedure Preparation:  Monitoring: As per clinic protocol. Respiration, ETCO2, SpO2, BP, heart rate and rhythm monitor placed and checked for adequate function Safety Precautions: Patient was assessed for positional comfort and pressure points before starting the procedure. Time-out: I initiated and conducted the "Time-out" before starting the procedure, as per protocol. The patient was asked to participate by confirming the accuracy of the "Time Out" information. Verification of the correct person, site, and procedure were performed and confirmed by me, the nursing staff, and the patient. "Time-out" conducted as per Joint Commission's Universal Protocol (UP.01.01.01). Time:  1002 Start Time: 1002 hrs.  Description of Procedure:          Laterality: (see above) Targeted Levels: (see above)  Safety Precautions: Aspiration looking for blood return was conducted prior to all injections. At no point did we inject any substances, as a needle was being advanced. Before injecting, the patient was told to immediately notify me if she  was experiencing any new onset of "ringing in the ears, or metallic taste in the mouth". No attempts were made at seeking any paresthesias. Safe injection practices and needle disposal techniques used. Medications properly checked for expiration dates. SDV (single dose vial) medications used. After the completion of the procedure, all disposable equipment used was discarded in the proper designated medical waste containers. Local Anesthesia: Protocol guidelines were followed. The patient was positioned over the fluoroscopy table. The area was prepped in the usual manner. The time-out was completed. The target area was identified using fluoroscopy. A 12-in long, straight, sterile hemostat was used with fluoroscopic guidance to locate the targets for each level blocked. Once located, the skin was marked with an approved surgical skin marker. Once all sites were marked, the skin (epidermis, dermis, and hypodermis), as well as deeper tissues (fat, connective tissue and muscle) were infiltrated with a small amount of a short-acting local anesthetic, loaded on a 10cc syringe with a 25G, 1.5-in  Needle. An appropriate amount of time was allowed for local anesthetics to take effect before proceeding to the next step. Local Anesthetic: Lidocaine  2.0% The unused portion of the local anesthetic was discarded in the proper designated containers. Technical description of process:  L2 Medial Branch Nerve Block (MBB): The target area for the L2 medial branch is at the junction of the postero-lateral aspect of the superior articular process and the superior, posterior,  and medial edge of the transverse process of L3. Under fluoroscopic guidance, a Quincke needle was inserted until contact was made with os over the superior postero-lateral aspect of the pedicular shadow (target area). After negative aspiration for blood, 0.5 mL of the nerve block solution was injected without difficulty or complication. The needle was removed intact. L3 Medial Branch Nerve Block (MBB): The target area for the L3 medial branch is at the junction of the postero-lateral aspect of the superior articular process and the superior, posterior, and medial edge of the transverse process of L4. Under fluoroscopic guidance, a Quincke needle was inserted until contact was made with os over the superior postero-lateral aspect of the pedicular shadow (target area). After negative aspiration for blood, 0.5 mL of the nerve block solution was injected without difficulty or complication. The needle was removed intact. L4 Medial Branch Nerve Block (MBB): The target area for the L4 medial branch is at the junction of the postero-lateral aspect of the superior articular process and the superior, posterior, and medial edge of the transverse process of L5. Under fluoroscopic guidance, a Quincke needle was inserted until contact was made with os over the superior postero-lateral aspect of the pedicular shadow (target area). After negative aspiration for blood, 0.5 mL of the nerve block solution was injected without difficulty or complication. The needle was removed intact. L5 Medial Branch Nerve Block (MBB): The target area for the L5 medial branch is at the junction of the postero-lateral aspect of the superior articular process and the superior, posterior, and medial edge of the sacral ala. Under fluoroscopic guidance, a Quincke needle was inserted until contact was made with os over the superior postero-lateral aspect of the pedicular shadow (target area). After negative aspiration for blood, 0.5 mL of the nerve  block solution was injected without difficulty or complication. The needle was removed intact. S1 Medial Branch Nerve Block (MBB): The target area for the S1 medial branch is at the posterior and inferior 6 o'clock position of the L5-S1 facet joint. Under fluoroscopic guidance, the Quincke needle inserted for  the L5 MBB was redirected until contact was made with os over the inferior and postero aspect of the sacrum, at the 6 o' clock position under the L5-S1 facet joint (Target area). After negative aspiration for blood, 0.5 mL of the nerve block solution was injected without difficulty or complication. The needle was removed intact.  Once the entire procedure was completed, the treated area was cleaned, making sure to leave some of the prepping solution back to take advantage of its long term bactericidal properties.         Illustration of the posterior view of the lumbar spine and the posterior neural structures. Laminae of L2 through S1 are labeled. DPRL5, dorsal primary ramus of L5; DPRS1, dorsal primary ramus of S1; DPR3, dorsal primary ramus of L3; FJ, facet (zygapophyseal) joint L3-L4; I, inferior articular process of L4; LB1, lateral branch of dorsal primary ramus of L1; IAB, inferior articular branches from L3 medial branch (supplies L4-L5 facet joint); IBP, intermediate branch plexus; MB3, medial branch of dorsal primary ramus of L3; NR3, third lumbar nerve root; S, superior articular process of L5; SAB, superior articular branches from L4 (supplies L4-5 facet joint also); TP3, transverse process of L3.   Facet Joint Innervation (* possible contribution)  L1-2 T12, L1 (L2*)  Medial Branch  L2-3 L1, L2 (L3*)         "          "  L3-4 L2, L3 (L4*)         "          "  L4-5 L3, L4 (L5*)         "          "  L5-S1 L4, L5, S1          "          "    Vitals:   03/24/24 1000 03/24/24 1005 03/24/24 1011 03/24/24 1025  BP: (!) 152/100 (!) 146/102 (!) 151/97 (!) 147/94  Pulse:      Resp:  18 17 16 16   Temp:    97.6 F (36.4 C)  TempSrc:    Temporal  SpO2: 100% 100% 100% 98%  Weight:      Height:         End Time: 1011 hrs.  Imaging Guidance (Spinal):          Type of Imaging Technique: Fluoroscopy Guidance (Spinal) Indication(s): Fluoroscopy guidance for needle placement to enhance accuracy in procedures requiring precise needle localization for targeted delivery of medication in or near specific anatomical locations not easily accessible without such real-time imaging assistance. Exposure Time: Please see nurses notes. Contrast: None used. Fluoroscopic Guidance: I was personally present during the use of fluoroscopy. "Tunnel Vision Technique" used to obtain the best possible view of the target area. Parallax error corrected before commencing the procedure. "Direction-depth-direction" technique used to introduce the needle under continuous pulsed fluoroscopy. Once target was reached, antero-posterior, oblique, and lateral fluoroscopic projection used confirm needle placement in all planes. Images permanently stored in EMR. Interpretation: No contrast injected. I personally interpreted the imaging intraoperatively. Adequate needle placement confirmed in multiple planes. Permanent images saved into the patient's record.  Post-operative Assessment:  Post-procedure Vital Signs:  Pulse/HCG Rate: 9281 Temp: 97.6 F (36.4 C) Resp: 16 BP: (!) 147/94 SpO2: 98 %  EBL: None  Complications: No immediate post-treatment complications observed by team, or reported by patient.  Note: The patient tolerated the entire procedure well. A repeat  set of vitals were taken after the procedure and the patient was kept under observation following institutional policy, for this type of procedure. Post-procedural neurological assessment was performed, showing return to baseline, prior to discharge. The patient was provided with post-procedure discharge instructions, including a section on how to  identify potential problems. Should any problems arise concerning this procedure, the patient was given instructions to immediately contact us , at any time, without hesitation. In any case, we plan to contact the patient by telephone for a follow-up status report regarding this interventional procedure.  Comments:  No additional relevant information.  Plan of Care (POC)  Orders:  Orders Placed This Encounter  Procedures   LUMBAR FACET(MEDIAL BRANCH NERVE BLOCK) MBNB    Scheduling Instructions:     Procedure: Lumbar facet block (AKA.: Lumbosacral medial branch nerve block)     Side: Bilateral     Level: L3-4, L4-5, and L5-S1 Facets (L2, L3, L4, L5, and S1 Medial Branch Nerves)     Sedation: Patient's choice.     Timeframe: Today    Where will this procedure be performed?:   [facility]   [facility] C-ARM 1-60 MIN NO REPORT    Intraoperative interpretation by procedural physician at [facility].    Standing Status:   Standing    Number of Occurrences:   1    Reason for exam::   Assistance in needle guidance and placement for procedures requiring needle placement in or near specific anatomical locations not easily accessible without such assistance.   Informed Consent Details: Physician/Practitioner Attestation; Transcribe to consent form and obtain patient signature    Nursing Order: Transcribe to consent form and obtain patient signature. Note: Always confirm laterality of pain with Ms. Heaphy, before procedure.    Physician/Practitioner attestation of informed consent for procedure/surgical case:   I, the physician/practitioner, attest that I have discussed with the patient the benefits, risks, side effects, alternatives, likelihood of achieving goals and potential problems during recovery for the procedure that I have provided informed consent.    Procedure:   Lumbar Facet Block  under fluoroscopic guidance    Physician/Practitioner performing the procedure:    Albana Saperstein A. Barth Borne MD    Indication/Reason:   Low Back Pain, with our without leg pain, due to Facet Joint Arthralgia (Joint Pain) Spondylosis (Arthritis of the Spine), without myelopathy or radiculopathy (Nerve Damage).   Provide equipment / supplies at bedside    Procedure tray: "Block Tray" (Disposable  single use) Skin infiltration needle: Regular 1.5-in, 25-G, (x1) Block Needle type: Spinal Amount/quantity: 4 Size: Medium (5-inch) Gauge: 22G    Standing Status:   Standing    Number of Occurrences:   1    Specify:   Block Tray   Saline lock IV    Have LR [phone] mL available and administer at 125 mL/hr if patient becomes hypotensive.    Standing Status:   Standing    Number of Occurrences:   1   Chronic Opioid Analgesic:   Hydrocodone /APAP 10/325, 1 tab PO q 8 hrs PRN (30 mg/day of hydrocodone ) (975 mg/day of acetaminophen ) MME/day: 30 mg/day.    Medications ordered for procedure: Meds ordered this encounter  Medications   lidocaine  (XYLOCAINE ) 2 % (with pres) injection 400 mg   pentafluoroprop-tetrafluoroeth (GEBAUERS) aerosol   midazolam  (VERSED ) 5 MG/5ML injection 0.5-2 mg    Make sure Flumazenil is available in the pyxis when using this medication. If oversedation occurs, administer 0.2 mg IV over 15  sec. If after 45 sec no response, administer 0.2 mg again over 1 min; may repeat at 1 min intervals; not to exceed 4 doses (1 mg)   fentaNYL  (SUBLIMAZE ) injection 25-50 mcg    Make sure Narcan  is available in the pyxis when using this medication. In the event of respiratory depression (RR< 8/min): Titrate NARCAN  (naloxone ) in increments of 0.1 to 0.2 mg IV at 2-3 minute intervals, until desired degree of reversal.   ropivacaine  (PF) 2 mg/mL (0.2%) (NAROPIN ) injection 18 mL   triamcinolone  acetonide (KENALOG -40) injection 80 mg   Medications administered: We administered lidocaine , pentafluoroprop-tetrafluoroeth, midazolam , ropivacaine  (PF) 2 mg/mL (0.2%), and triamcinolone   acetonide.  See the medical record for exact dosing, route, and time of administration.  Follow-up plan:   Return in about 2 weeks (around 04/07/2024) for (Face2F), (PPE).       Interventional Therapies  Risk  Complexity Considerations:   Allergy: CONTRAST  COPD  BA  CKD  SOBOE  GERD  Jehovah's witness  Hx. intestinal obstruction  CHF  HTN  DM  MO (BMI>30)  No RFA until patient brings BMI down to less than 30 kg/m   Planned  Pending:   Therapeutic bilateral lumbar facet MBB #5    Under consideration:   Therapeutic bilateral lumbar facet MBB #4  Possible bilateral lumbar facet RFA #1 (on hold until BMI is brought down to 30 kg/m)   Completed:   Diagnostic left IA shoulder and AC joint inj. x1 (10/08/2023) (100/100/60/60)  Diagnostic bilateral lumbar facet MBB x3 (01/18/2022) (5/10 to 0/10) (100/100/75/75-100)    Therapeutic  Palliative (PRN) options:   Therapeutic/palliative bilateral lumbar facet MBB   Pharmacotherapy  Nonopioids transferred 11/09/2020: Magnesium , Lyrica , and Mobic .       Recent Visits Date Type Provider Dept  03/02/24 Office Visit Renaldo Caroli, MD [facility]  02/04/24 Office Visit Renaldo Caroli, MD [facility]  01/16/24 Procedure visit Renaldo Caroli, MD [facility]  12/30/23 Office Visit Renaldo Caroli, MD [facility]  Showing recent visits within past 90 days and meeting all other requirements Today's Visits Date Type Provider Dept  03/24/24 Procedure visit Renaldo Caroli, MD [facility]  Showing today's visits and meeting all other requirements Future Appointments Date Type Provider Dept  03/30/24 Appointment Patel, Seema K, NP [facility]  Showing future appointments within next 90 days and meeting all other requirements  Disposition: Discharge home  Discharge (Date  Time): 03/24/2024; 1032 hrs.   Primary Care Physician: Macie Saxon,  MD Location: [facility] Note by: Candi Chafe, MD (TTS technology used. I apologize for any typographical errors that were not detected and corrected.) Date: 03/24/2024; Time: 11:08 AM  Disclaimer:  Medicine is not an Visual merchandiser. The only guarantee in medicine is that nothing is guaranteed. It is important to note that the decision to proceed with this intervention was based on the information collected from the patient. The Data and conclusions were drawn from the patient's questionnaire, the interview, and the physical examination. Because the information was provided in large part by the patient, it cannot be guaranteed that it has not been purposely or unconsciously manipulated. Every effort has been made to obtain as much relevant data as possible for this evaluation. It is important to note that the conclusions that lead to this procedure are derived in large part from the available data. Always take into account that the treatment will also be dependent on availability  of resources and existing treatment guidelines, considered by other Pain Management Practitioners as being common knowledge and practice, at the time of the intervention. For Medico-Legal purposes, it is also important to point out that variation in procedural techniques and pharmacological choices are the acceptable norm. The indications, contraindications, technique, and results of the above procedure should only be interpreted and judged by a Board-Certified Interventional Pain Specialist with extensive familiarity and expertise in the same exact procedure and technique.

## 2024-03-25 ENCOUNTER — Telehealth: Payer: Self-pay

## 2024-03-25 NOTE — Telephone Encounter (Signed)
 Post procedure follow up.  Patient states she is doing well.   ?

## 2024-03-30 ENCOUNTER — Ambulatory Visit: Attending: Nurse Practitioner | Admitting: Nurse Practitioner

## 2024-03-30 ENCOUNTER — Encounter: Payer: Medicare PPO | Admitting: Pain Medicine

## 2024-03-30 ENCOUNTER — Encounter: Payer: Self-pay | Admitting: Nurse Practitioner

## 2024-03-30 DIAGNOSIS — M25551 Pain in right hip: Secondary | ICD-10-CM

## 2024-03-30 DIAGNOSIS — M47816 Spondylosis without myelopathy or radiculopathy, lumbar region: Secondary | ICD-10-CM

## 2024-03-30 DIAGNOSIS — M0579 Rheumatoid arthritis with rheumatoid factor of multiple sites without organ or systems involvement: Secondary | ICD-10-CM | POA: Diagnosis present

## 2024-03-30 DIAGNOSIS — M7918 Myalgia, other site: Secondary | ICD-10-CM

## 2024-03-30 DIAGNOSIS — M47812 Spondylosis without myelopathy or radiculopathy, cervical region: Secondary | ICD-10-CM

## 2024-03-30 DIAGNOSIS — Z79899 Other long term (current) drug therapy: Secondary | ICD-10-CM | POA: Diagnosis present

## 2024-03-30 DIAGNOSIS — M542 Cervicalgia: Secondary | ICD-10-CM

## 2024-03-30 DIAGNOSIS — G894 Chronic pain syndrome: Secondary | ICD-10-CM

## 2024-03-30 DIAGNOSIS — G8929 Other chronic pain: Secondary | ICD-10-CM

## 2024-03-30 DIAGNOSIS — M48062 Spinal stenosis, lumbar region with neurogenic claudication: Secondary | ICD-10-CM | POA: Diagnosis not present

## 2024-03-30 DIAGNOSIS — M545 Low back pain, unspecified: Secondary | ICD-10-CM | POA: Diagnosis present

## 2024-03-30 DIAGNOSIS — M5136 Other intervertebral disc degeneration, lumbar region with discogenic back pain only: Secondary | ICD-10-CM

## 2024-03-30 DIAGNOSIS — Z79891 Long term (current) use of opiate analgesic: Secondary | ICD-10-CM

## 2024-03-30 MED ORDER — HYDROCODONE-ACETAMINOPHEN 10-325 MG PO TABS: 1.0000 | ORAL_TABLET | Freq: Three times a day (TID) | ORAL | 0 refills | Status: DC

## 2024-03-30 NOTE — Patient Instructions (Addendum)
 Pain Management Discharge Instructions  General Discharge Instructions :  If you need to reach your doctor call: Monday-Friday 8:00 am - 4:00 pm at [phone] or toll free [phone].  After clinic hours [phone] to have operator reach doctor.  Bring all of your medication bottles to all your appointments in the pain clinic.  To cancel or reschedule your appointment with Pain Management please remember to call 24 hours in advance to avoid a fee.  Refer to the educational materials which you have been given on: General Risks, I had my Procedure. Discharge Instructions, Post Sedation.  Post Procedure Instructions:  The drugs you were given will stay in your system until tomorrow, so for the next 24 hours you should not drive, make any legal decisions or drink any alcoholic beverages.  You may eat anything you prefer, but it is better to start with liquids then soups and crackers, and gradually work up to solid foods.  Please notify your doctor immediately if you have any unusual bleeding, trouble breathing or pain that is not related to your normal pain.  Depending on the type of procedure that was done, some parts of your body may feel week and/or numb.  This usually clears up by tonight or the next day.  Walk with the use of an assistive device or accompanied by an adult for the 24 hours.  You may use ice on the affected area for the first 24 hours.  Put ice in a Ziploc bag and cover with a towel and place against area 15 minutes on 15 minutes off.  You may switch to heat after 24 hours. ______________________________________________________________________    Opioid Pain Medication Update  To: All patients taking opioid pain medications. (I.e.: hydrocodone , hydromorphone, oxycodone, oxymorphone, morphine, codeine, methadone, tapentadol, tramadol, buprenorphine, fentanyl , etc.)  Re: Updated review of side effects and adverse reactions of opioid analgesics, as well as new  information about long term effects of this class of medications.  Direct risks of long-term opioid therapy are not limited to opioid addiction and overdose. Potential medical risks include serious fractures, breathing problems during sleep, hyperalgesia, immunosuppression, chronic constipation, bowel obstruction, myocardial infarction, and tooth decay secondary to xerostomia.  Unpredictable adverse effects that can occur even if you take your medication correctly: Cognitive impairment, respiratory depression, and death. Most people think that if they take their medication "correctly", and "as instructed", that they will be safe. Nothing could be farther from the truth. In reality, a significant amount of recorded deaths associated with the use of opioids has occurred in individuals that had taken the medication for a long time, and were taking their medication correctly. The following are examples of how this can happen: Patient taking his/her medication for a long time, as instructed, without any side effects, is given a certain antibiotic or another unrelated medication, which in turn triggers a "Drug-to-drug interaction" leading to disorientation, cognitive impairment, impaired reflexes, respiratory depression or an untoward event leading to serious bodily harm or injury, including death.  Patient taking his/her medication for a long time, as instructed, without any side effects, develops an acute impairment of liver and/or kidney function. This will lead to a rapid inability of the body to breakdown and eliminate their pain medication, which will result in effects similar to an "overdose", but with the same medicine and dose that they had always taken. This again may lead to disorientation, cognitive impairment, impaired reflexes, respiratory depression or an untoward event leading to serious bodily harm or injury, including  death.  A similar problem will occur with patients as they grow older and their  liver and kidney function begins to decrease as part of the aging process.  Background information: Historically, the original case for using long-term opioid therapy to treat chronic noncancer pain was based on safety assumptions that subsequent experience has called into question. In 1996, the American Pain Society and the American Academy of Pain Medicine issued a consensus statement supporting long-term opioid therapy. This statement acknowledged the dangers of opioid prescribing but concluded that the risk for addiction was low; respiratory depression induced by opioids was short-lived, occurred mainly in opioid-naive patients, and was antagonized by pain; tolerance was not a common problem; and efforts to control diversion should not constrain opioid prescribing. This has now proven to be wrong. Experience regarding the risks for opioid addiction, misuse, and overdose in community practice has failed to support these assumptions.  According to the Centers for Disease Control and Prevention, fatal overdoses involving opioid analgesics have increased sharply over the past decade. Currently, more than 96,700 people die from drug overdoses every year. Opioids are a factor in 7 out of every 10 overdose deaths. Deaths from drug overdose have surpassed motor vehicle accidents as the leading cause of death for individuals between the ages of 28 and 53.  Clinical data suggest that neuroendocrine dysfunction may be very common in both men and women, potentially causing hypogonadism, erectile dysfunction, infertility, decreased libido, osteoporosis, and depression. Recent studies linked higher opioid dose to increased opioid-related mortality. Controlled observational studies reported that long-term opioid therapy may be associated with increased risk for cardiovascular events. Subsequent meta-analysis concluded that the safety of long-term opioid therapy in elderly patients has not been proven.   Side Effects  and adverse reactions: Common side effects: Drowsiness (sedation). Dizziness. Nausea and vomiting. Constipation. Physical dependence -- Dependence often manifests with withdrawal symptoms when opioids are discontinued or decreased. Tolerance -- As you take repeated doses of opioids, you require increased medication to experience the same effect of pain relief. Respiratory depression -- This can occur in healthy people, especially with higher doses. However, people with COPD, asthma or other lung conditions may be even more susceptible to fatal respiratory impairment.  Uncommon side effects: An increased sensitivity to feeling pain and extreme response to pain (hyperalgesia). Chronic use of opioids can lead to this. Delayed gastric emptying (the process by which the contents of your stomach are moved into your small intestine). Muscle rigidity. Immune system and hormonal dysfunction. Quick, involuntary muscle jerks (myoclonus). Arrhythmia. Itchy skin (pruritus). Dry mouth (xerostomia).  Long-term side effects: Chronic constipation. Sleep-disordered breathing (SDB). Increased risk of bone fractures. Hypothalamic-pituitary-adrenal dysregulation. Increased risk of overdose.  RISKS: Respiratory depression and death: Opioids increase the risk of respiratory depression and death.  Drug-to-drug interactions: Opioids are relatively contraindicated in combination with benzodiazepines, sleep inducers, and other central nervous system depressants. Other classes of medications (i.e.: certain antibiotics and even over-the-counter medications) may also trigger or induce respiratory depression in some patients.  Medical conditions: Patients with pre-existing respiratory problems are at higher risk of respiratory failure and/or depression when in combination with opioid analgesics. Opioids are relatively contraindicated in some medical conditions such as central sleep apnea.   Fractures and Falls:   Opioids increase the risk and incidence of falls. This is of particular importance in elderly patients.  Endocrine System:  Long-term administration is associated with endocrine abnormalities (endocrinopathies). (Also known as Opioid-induced Endocrinopathy) Influences on both the hypothalamic-pituitary-adrenal axis?and the hypothalamic-pituitary-gonadal  axis have been demonstrated with consequent hypogonadism and adrenal insufficiency in both sexes. Hypogonadism and decreased levels of dehydroepiandrosterone sulfate have been reported in men and women. Endocrine effects include: Amenorrhoea in women (abnormal absence of menstruation) Reduced libido in both sexes Decreased sexual function Erectile dysfunction in men Hypogonadisms (decreased testicular function with shrinkage of testicles) Infertility Depression and fatigue Loss of muscle mass Anxiety Depression Immune suppression Hyperalgesia Weight gain Anemia Osteoporosis Patients (particularly women of childbearing age) should avoid opioids. There is insufficient evidence to recommend routine monitoring of asymptomatic patients taking opioids in the long-term for hormonal deficiencies.  Immune System: Human studies have demonstrated that opioids have an immunomodulating effect. These effects are mediated via opioid receptors both on immune effector cells and in the central nervous system. Opioids have been demonstrated to have adverse effects on antimicrobial response and anti-tumour surveillance. Buprenorphine has been demonstrated to have no impact on immune function.  Opioid Induced Hyperalgesia: Human studies have demonstrated that prolonged use of opioids can lead to a state of abnormal pain sensitivity, sometimes called opioid induced hyperalgesia (OIH). Opioid induced hyperalgesia is not usually seen in the absence of tolerance to opioid analgesia. Clinically, hyperalgesia may be diagnosed if the patient on long-term  opioid therapy presents with increased pain. This might be qualitatively and anatomically distinct from pain related to disease progression or to breakthrough pain resulting from development of opioid tolerance. Pain associated with hyperalgesia tends to be more diffuse than the pre-existing pain and less defined in quality. Management of opioid induced hyperalgesia requires opioid dose reduction.  Cancer: Chronic opioid therapy has been associated with an increased risk of cancer among noncancer patients with chronic pain. This association was more evident in chronic strong opioid users. Chronic opioid consumption causes significant pathological changes in the small intestine and colon. Epidemiological studies have found that there is a link between opium dependence and initiation of gastrointestinal cancers. Cancer is the second leading cause of death after cardiovascular disease. Chronic use of opioids can cause multiple conditions such as GERD, immunosuppression and renal damage as well as carcinogenic effects, which are associated with the incidence of cancers.   Mortality: Long-term opioid use has been associated with increased mortality among patients with chronic non-cancer pain (CNCP).  Prescription of long-acting opioids for chronic noncancer pain was associated with a significantly increased risk of all-cause mortality, including deaths from causes other than overdose.  Reference: Von Korff M, Kolodny A, Deyo RA, Chou R. Long-term opioid therapy reconsidered. Ann Intern Med. 2011 Sep 6;155(5):325-8. doi: 10.7326/[id number]. PMID: 09811914; PMCID: [id number]. Achilles Achilles, Hayward RA, Dunn KM, [place] KP. Risk of adverse events in patients prescribed long-term opioids: A cohort study in the [place] Clinical [employer] Research Datalink. Eur J Pain. 2019 May;23(5):908-922. doi: 10.1002/ejp.1357. Epub 2019 Jan 31. PMID: 86578469. Colameco S, Coren JS, Ciervo CA.  Continuous opioid treatment for chronic noncancer pain: a time for moderation in prescribing. Postgrad Med. 2009 Jul;121(4):61-6. doi: 10.3810/pgm.2009.07.2032. PMID: 62952841. Orlan Bis RN, [place], Blazina I, Sheliah Deutscher, Bougatsos C, Deyo RA. The effectiveness and risks of long-term opioid therapy for chronic pain: a systematic review for a [employer] of [employer] Pathways to [employer]. Ann Intern Med. 2015 Feb 17;162(4):276-86. doi: 10.7326/M14-2559. PMID: 32440102. [facility], [place] DM. NCHS Data Brief No. 22. [place]: Centers for Disease Control and Prevention; 2009. Sep, Increase in Fatal Poisonings Involving Opioid Analgesics in the [place] , 1999-2006. Rayford Cake, Choi HR, Oh  TK. Long-term opioid use and mortality in patients with chronic non-cancer pain: Ten-year follow-up study in [place] from 2010 through 2019. EClinicalMedicine. 2022 Jul 18;51:101558. doi: 10.1016/j.eclinm.2022.308657. PMID: 84696295; PMCID: [id number]. Huser, W., Schubert, T., Vogelmann, T. et al. All-cause mortality in patients with long-term opioid therapy compared with non-opioid analgesics for chronic non-cancer pain: a database study. BMC Med 18, 162 (2020). http://lester.info/ Rashidian H, Zendehdel K, Kamangar F, Malekzadeh R, Haghdoost AA. An Ecological Study of the Association between Opiate Use and Incidence of Cancers. Addict Health. 2016 Fall;8(4):252-260. PMID: 02725366; PMCID: [id number].  Our Goal: Our goal is to control your pain with means other than the use of opioid pain medications.  Our Recommendation: Talk to your physician about coming off of these medications. We can assist you with the tapering down and stopping these medicines. Based on the new information, even if you cannot completely stop the medication, a decrease in the dose may be associated with a lesser risk. Ask for other means of controlling the pain. Decrease  or eliminate those factors that significantly contribute to your pain such as smoking, obesity, and a diet heavily tilted towards "inflammatory" nutrients.  Last Updated: 05/27/2023   ______________________________________________________________________       ______________________________________________________________________    Medication Recommendations and Reminders  Applies to: All patients receiving prescriptions (written and/or electronic).  Medication Rules & Regulations: You are responsible for reading, knowing, and following our "Medication Rules" document. These exist for your safety and that of others. They are not flexible and neither are we. Dismissing or ignoring them is an act of "non-compliance" that may result in complete and irreversible termination of such medication therapy. For safety reasons, "non-compliance" will not be tolerated. As with the [place] fundamental legal principle of "ignorance of the law is no defense", we will accept no excuses for not having read and knowing the content of documents provided to you by our practice.  Pharmacy of record:  Definition: This is the pharmacy where your electronic prescriptions will be sent.  We do not endorse any particular pharmacy. It is up to you and your insurance to decide what pharmacy to use.  We do not restrict you in your choice of pharmacy. However, once we write for your prescriptions, we will NOT be re-sending more prescriptions to fix restricted supply problems created by your pharmacy, or your insurance.  The pharmacy listed in the electronic medical record should be the one where you want electronic prescriptions to be sent. If you choose to change pharmacy, simply notify our nursing staff. Changes will be made only during your regular appointments and not over the phone.  Recommendations: Keep all of your pain medications in a safe place, under lock and key, even if you live alone. We will NOT replace lost,  stolen, or damaged medication. We do not accept "Police Reports" as proof of medications having been stolen. After you fill your prescription, take 1 week's worth of pills and put them away in a safe place. You should keep a separate, properly labeled bottle for this purpose. The remainder should be kept in the original bottle. Use this as your primary supply, until it runs out. Once it's gone, then you know that you have 1 week's worth of medicine, and it is time to come in for a prescription refill. If you do this correctly, it is unlikely that you will ever run out of medicine. To make sure that the above recommendation works, it is very important that you make sure  your medication refill appointments are scheduled at least 1 week before you run out of medicine. To do this in an effective manner, make sure that you do not leave the office without scheduling your next medication management appointment. Always ask the nursing staff to show you in your prescription , when your medication will be running out. Then arrange for the receptionist to get you a return appointment, at least 7 days before you run out of medicine. Do not wait until you have 1 or 2 pills left, to come in. This is very poor planning and does not take into consideration that we may need to cancel appointments due to bad weather, sickness, or emergencies affecting our staff. DO NOT ACCEPT A "Partial Fill": If for any reason your pharmacy does not have enough pills/tablets to completely fill or refill your prescription, do not allow for a "partial fill". The law allows the pharmacy to complete that prescription within 72 hours, without requiring a new prescription. If they do not fill the rest of your prescription within those 72 hours, you will need a separate prescription to fill the remaining amount, which we will NOT provide. If the reason for the partial fill is your insurance, you will need to talk to the pharmacist about payment  alternatives for the remaining tablets, but again, DO NOT ACCEPT A PARTIAL FILL, unless you can trust your pharmacist to obtain the remainder of the pills within 72 hours.  Prescription refills and/or changes in medication(s):  Prescription refills, and/or changes in dose or medication, will be conducted only during scheduled medication management appointments. (Applies to both, written and electronic prescriptions.) No refills on procedure days. No medication will be changed or started on procedure days. No changes, adjustments, and/or refills will be conducted on a procedure day. Doing so will interfere with the diagnostic portion of the procedure. No phone refills. No medications will be "called into the pharmacy". No Fax refills. No weekend refills. No Holliday refills. No after hours refills.  Remember:  Business hours are:  Monday to Thursday 8:00 AM to 4:00 PM Provider's Schedule: Renaldo Caroli, MD - Appointments are:  Medication management: Monday and Wednesday 8:00 AM to 4:00 PM Procedure day: Tuesday and Thursday 7:30 AM to 4:00 PM Cephus Collin, MD - Appointments are:  Medication management: Tuesday and Thursday 8:00 AM to 4:00 PM Procedure day: Monday and Wednesday 7:30 AM to 4:00 PM (Last update: 09/11/2022) ______________________________________________________________________      ______________________________________________________________________    Medication Rules  Purpose: To inform patients, and their family members, of our medication rules and regulations.  Applies to: All patients receiving prescriptions from our practice (written or electronic).  Pharmacy of record: This is the pharmacy where your electronic prescriptions will be sent. Make sure we have the correct one.  Electronic prescriptions: In compliance with the [place]  Strengthen Opioid Misuse Prevention (STOP) Act of 2017 (Session Law 2017-74/H243), effective November 19, 2018, all  controlled substances must be electronically prescribed. Written prescriptions, faxing, or calling prescriptions to a pharmacy will no longer be done.  Prescription refills: These will be provided only during in-person appointments. No medications will be renewed without a "face-to-face" evaluation with your provider. Applies to all prescriptions.  NOTE: The following applies primarily to controlled substances (Opioid* Pain Medications).   Type of encounter (visit): For patients receiving controlled substances, face-to-face visits are required. (Not an option and not up to the patient.)  Patient's Responsibilities: Pain Pills: Bring all pain pills to every appointment (  except for procedure appointments). Pill counts are required.  Pill Bottles: Bring pills in original pharmacy bottle. Bring bottle, even if empty. Always bring the bottle of the most recent fill.  Medication refills: You are responsible for knowing and keeping track of what medications you are taking and when is it that you will need a refill. The day before your appointment: write a list of all prescriptions that need to be refilled. The day of the appointment: give the list to the admitting nurse. Prescriptions will be written only during appointments. No prescriptions will be written on procedure days. If you forget a medication: it will not be "Called in", "Faxed", or "electronically sent". You will need to get another appointment to get these prescribed. No early refills. Do not call asking to have your prescription filled early. Partial  or short prescriptions: Occasionally your pharmacy may not have enough pills to fill your prescription.  NEVER ACCEPT a partial fill or a prescription that is short of the total amount of pills that you were prescribed.  With controlled substances the law allows 72 hours for the pharmacy to complete the prescription.  If the prescription is not completed within 72 hours, the pharmacist will  require a new prescription to be written. This means that you will be short on your medicine and we WILL NOT send another prescription to complete your original prescription.  Instead, request the pharmacy to send a carrier to a nearby branch to get enough medication to provide you with your full prescription. Prescription Accuracy: You are responsible for carefully inspecting your prescriptions before leaving our office. Have the discharge nurse carefully go over each prescription with you, before taking them home. Make sure that your name is accurately spelled, that your address is correct. Check the name and dose of your medication to make sure it is accurate. Check the number of pills, and the written instructions to make sure they are clear and accurate. Make sure that you are given enough medication to last until your next medication refill appointment. Taking Medication: Take medication as prescribed. When it comes to controlled substances, taking less pills or less frequently than prescribed is permitted and encouraged. Never take more pills than instructed. Never take the medication more frequently than prescribed.  Inform other Doctors: Always inform, all of your healthcare providers, of all the medications you take. Pain Medication from other Providers: You are not allowed to accept any additional pain medication from any other Doctor or Healthcare provider. There are two exceptions to this rule. (see below) In the event that you require additional pain medication, you are responsible for notifying us , as stated below. Cough Medicine: Often these contain an opioid, such as codeine or hydrocodone . Never accept or take cough medicine containing these opioids if you are already taking an opioid* medication. The combination may cause respiratory failure and death. Medication Agreement: You are responsible for carefully reading and following our Medication Agreement. This must be signed before  receiving any prescriptions from our practice. Safely store a copy of your signed Agreement. Violations to the Agreement will result in no further prescriptions. (Additional copies of our Medication Agreement are available upon request.) Laws, Rules, & Regulations: All patients are expected to follow all [address] and [employer], Rules, [place] [employer]. Ignorance of the Laws does not constitute a valid excuse.  Illegal drugs and Controlled Substances: The use of illegal substances (including, but not limited to marijuana and its derivatives) and/or the illegal use of any  controlled substances is strictly prohibited. Violation of this rule may result in the immediate and permanent discontinuation of any and all prescriptions being written by our practice. The use of any illegal substances is prohibited. Adopted CDC guidelines & recommendations: Target dosing levels will be at or below 60 MME/day. Use of benzodiazepines** is not recommended. Urine Drug testing: Patients taking controlled substances will be required to provide a urine sample upon request. Do not void before coming to your medication management appointments. Hold emptying your bladder until you are admitted. The admitting nurse will inform you if a sample is required. Our practice reserves the right to call you at any time to provide a sample. Once receiving the call, you have 24 hours to comply with request. Not providing a sample upon request may result in termination of medication therapy.  Exceptions: There are only two exceptions to the rule of not receiving pain medications from other Healthcare Providers. Exception #1 (Emergencies): In the event of an emergency (i.e.: accident requiring emergency care), you are allowed to receive additional pain medication. However, you are responsible for: As soon as you are able, call our office [phone], at any time of the day or night, and leave a message stating your name, the date and  nature of the emergency, and the name and dose of the medication prescribed. In the event that your call is answered by a member of our staff, make sure to document and save the date, time, and the name of the person that took your information.  Exception #2 (Planned Surgery): In the event that you are scheduled by another doctor or dentist to have any type of surgery or procedure, you are allowed (for a period no longer than 30 days), to receive additional pain medication, for the acute post-op pain. However, in this case, you are responsible for picking up a copy of our "Post-op Pain Management for Surgeons" handout, and giving it to your surgeon or dentist. This document is available at our office, and does not require an appointment to obtain it. Simply go to our office during business hours (Monday-Thursday from 8:00 AM to 4:00 PM) (Friday 8:00 AM to 12:00 Noon) or if you have a scheduled appointment with us , prior to your surgery, and ask for it by name. In addition, you are responsible for: calling our office [phone], at any time of the day or night, and leaving a message stating your name, name of your surgeon, type of surgery, and date of procedure or surgery. Failure to comply with your responsibilities may result in termination of therapy involving the controlled substances.  Consequences:  Non-compliance with the above rules may result in permanent discontinuation of medication prescription therapy. All patients receiving any type of controlled substance is expected to comply with the above patient responsibilities. Not doing so may result in permanent discontinuation of medication prescription therapy. Medication Agreement Violation. Following the above rules, including your responsibilities will help you in avoiding a Medication Agreement Violation ("Breaking your Pain Medication Contract").  *Opioid medications include: morphine, codeine, oxycodone, oxymorphone, hydrocodone ,  hydromorphone, meperidine, tramadol, tapentadol, buprenorphine, fentanyl , methadone. **Benzodiazepine medications include: diazepam (Valium), alprazolam (Xanax), clonazepam (Klonopine), lorazepam (Ativan), clorazepate (Tranxene), chlordiazepoxide (Librium), estazolam (Prosom), oxazepam (Serax), temazepam (Restoril), triazolam (Halcion) (Last updated: 09/11/2023) ______________________________________________________________________      ______________________________________________________________________    Muscle Spasms & Cramps  Cause(s):  Most common - vitamin and/or electrolyte (calcium, potassium, sodium, etc.) deficiencies. Post procedure - steroids (injected, oral, or inhaled) can make  your kidneys excrete (loose) electrolytes. Most of the time this will not cause any symptoms however, if you happen to be borderline low on your electrolytes, it may temporarily triggering cramps & spasms.  Possible triggers: Sweating - causes loss of electrolytes thru the skin. Steroids - causes loss of electrolytes thru the urine.  Treatment: (over-the-counter)  Gatorade (or any other electrolyte-replenishing drink) - Take 1, 8 oz glass with each meal (3 times a day). Mechanism of action: Replenishes lost electrolytes. Magnesium  400 to 500 mg - Take 1 tablet twice a day (one with breakfast and one at bedtime). If you have kidney disease talk to your primary care physician before taking any Magnesium . Mechanism of action: Magnesium  is a natural muscle relaxant. Tonic Water with quinine - Take 1, 8 oz glass before bedtime.  Mechanism of action: Quinine is used to treat spasms.  Last Update: 05/30/2023  ______________________________________________________________________

## 2024-03-30 NOTE — Progress Notes (Signed)
 PROVIDER NOTE: Interpretation of information contained herein should be left to medically-trained personnel. Specific patient instructions are provided elsewhere under "Patient Instructions" section of medical record. This document was created in part using AI and STT-dictation technology, any transcriptional errors that may result from this process are unintentional.  Patient: Mariah Poole  Service: E/M   PCP: Macie Saxon, MD  DOB: 01/29/1946  DOS: 03/30/2024  Provider: Cherylin Corrigan, NP  MRN: [id number]  Delivery: Face-to-face  Specialty: Interventional Pain Management  Type: Established Patient  Setting: Ambulatory outpatient facility  Specialty designation: 09  Referring Prov.: Macie Saxon, MD  Location: Outpatient office facility       HPI  Ms. Silvester Drown, a 78 y.o. year old female, is here today because of her No primary diagnosis found.. Ms. Eunice's primary complain today is Back Pain   Pain Assessment: Severity of Chronic pain is reported as a 3 /10. Location: Back (hips bilateral) Right, Left, Lower/hips bilateral down back of legs bilateral to above knees. Onset: More than a month ago. Quality: Aching, Constant, Discomfort, Restless ("Feels like I have been hit with something, I flopped feel, twisted"). Timing: Intermittent. Modifying factor(s): rest, procedures, medications. Vitals:  height is 5' (1.524 m) and weight is 172 lb (78 kg). Her temperature is 97.3 F (36.3 C) (abnormal). Her blood pressure is 154/97 (abnormal) and her pulse is 97. Her respiration is 16 and oxygen saturation is 98%.  BMI: Estimated body mass index is 33.59 kg/m as calculated from the following:   Height as of this encounter: 5' (1.524 m).   Weight as of this encounter: 172 lb (78 kg). Last encounter: 03/25/2024.  Reason for encounter: both, medication management and post-procedure evaluation and assessment.  The patient doing well with current medication regimen.  No adverse reaction or side  effects reported to the medication.  The patient has a history of rheumatoid arthritis and reported improvement in her symptoms following the procedure.  However, she experiences recurrent low back and bilateral hand pain, particularly with changes in the weather.   Of note, the patient underwent therapeutic and palliative bilateral lumbar facet block on Mar 24, 2024.  She reported 100% pain relief during the duration of the local anesthetic and continues to experience approximately 75% ongoing improvement in her symptoms after the procedure.   Procedure Type: Lumbar Facet, Medial Branch Block(s)   #5  Laterality: Bilateral  Level: L2, L3, L4, L5, and S1 Medial Branch Level(s). Injecting these levels blocks the L3-4, L4-5, and L5-S1 lumbar facet joints.   Imaging: Fluoroscopic guidance Spinal ([id number]) Anesthesia: Local anesthesia (1-2% Lidocaine ) Anxiolysis: IV Versed  2.0 mg Sedation: Moderate Sedation None required. No Fentanyl  administered.         DOS: 03/24/2024 Performed by: Candi Chafe, MD   Primary Purpose: Diagnostic/Therapeutic Indications: Low back pain severe enough to impact quality of life or function. 1. Chronic low back pain (1ry area of Pain) (Bilateral) (R>L) w/o sciatica   2. DDD (degenerative disc disease), lumbar w/ LBP   3. 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 Lumbar facet joint pain   5. Lumbar facet hypertrophy   6. Lumbar facet syndrome (Bilateral) (R>L)   7. Spondylosis without myelopathy or radiculopathy, lumbosacral region     NAS-11 Pain score:        Pre-procedure: 7 /10        Post-procedure: 0-No pain/10   Effectiveness:  Initial hour after procedure: 100 %  Subsequent  4-6 hours post-procedure: 100 % Analgesia past initial 6 hours: 75 % (current) Ongoing improvement:  Analgesic:   Ms. Recendiz, reported 100% pain relief during the duration of the local anesthetic and continues to experience approximately 75% ongoing  improvement in her symptoms after the procedure.  Function: Ms. Tabor reports improvement in function ROM: Ms. Keirstead reports improvement in ROM  Pharmacotherapy Assessment  Analgesic: Hydrocodone -acetaminophen  (Norco) 10-325 mg tablet every 8 hours as needed for pain. MME=30 Monitoring: [place] PMP: PDMP reviewed during this encounter.       Pharmacotherapy: No side-effects or adverse reactions reported. Compliance: No problems identified. Effectiveness: Clinically acceptable.  Sibyl Drafts, RN  03/30/2024 10:23 AM  Sign when Signing Visit Nursing Pain Medication Assessment:  Safety precautions to be maintained throughout the outpatient stay will include: orient to surroundings, keep bed in low position, maintain call bell within reach at all times, provide assistance with transfer out of bed and ambulation.  Medication Inspection Compliance: Pill count conducted under aseptic conditions, in front of the patient. Neither the pills nor the bottle was removed from the patient's sight at any time. Once count was completed pills were immediately returned to the patient in their original bottle.  Medication: Hydrocodone /APAP Pill/Patch Count: 5 of 90 pills/patches remain Pill/Patch Appearance: Markings consistent with prescribed medication Bottle Appearance: Standard pharmacy container. Clearly labeled. Filled Date: 4 / 46 / 2025 Last Medication intake:  Today    No results found for: "CBDTHCR" No results found for: "D8THCCBX" No results found for: "D9THCCBX"  UDS:  Summary  Date Value Ref Range Status  07/10/2023 Note  Final    Comment:    ==================================================================== [employer] (MW) ==================================================================== Test                             Result       Flag       Units  Drug Present and Declared for Prescription Verification   Hydrocodone                     2209         EXPECTED   ng/mg  creat   Hydromorphone                  439          EXPECTED   ng/mg creat   Dihydrocodeine                 596          EXPECTED   ng/mg creat   Norhydrocodone                 4188         EXPECTED   ng/mg creat    Sources of hydrocodone  include scheduled prescription medications.    Hydromorphone, dihydrocodeine and norhydrocodone are expected    metabolites of hydrocodone . Hydromorphone and dihydrocodeine are    also available as scheduled prescription medications.  ==================================================================== Test                      Result    Flag   Units      Ref Range   Creatinine              57               mg/dL      >=95 ==================================================================== Declared Medications:  The flagging and interpretation on this report are based on the  following declared medications.  Unexpected results may arise from  inaccuracies in the declared medications.   **Note: The testing scope of this panel includes these medications:   Hydrocodone  (Norco)   **Note: The testing scope of this panel does not include the  following reported medications:   Acetaminophen  (Norco)  Albuterol (Ventolin HFA)  Atenolol (Tenormin)  Budesonide (Symbicort )  Duloxetine (Cymbalta)  Famotidine  (Pepcid )  Formoterol (Symbicort )  Furosemide (Lasix)  Iron  Losartan (Cozaar)  Magnesium  (Mag-Ox)  Meloxicam  (Mobic )  Montelukast (Singulair)  Naloxone  (Narcan )  Pantoprazole  (Protonix )  Potassium (Klor-Con)  Pravastatin (Pravachol)  Semaglutide (Ozempic)  Spironolactone (Aldactone)  Topical Lidocaine   Turmeric  Vitamin C  Vitamin D3 ==================================================================== For clinical consultation, please call [phone]. ====================================================================       ROS  Constitutional: Denies any fever or chills Gastrointestinal: No reported hemesis, hematochezia,  vomiting, or acute GI distress Musculoskeletal: Denies any acute onset joint swelling, redness, loss of ROM, or weakness Neurological: No reported episodes of acute onset apraxia, aphasia, dysarthria, agnosia, amnesia, paralysis, loss of coordination, or loss of consciousness  Medication Review  DULoxetine, HYDROcodone -acetaminophen , Lidocaine , Semaglutide(0.25 or 0.5MG /DOS), Turmeric Curcumin, Vitamin D3, albuterol, ascorbic acid, atenolol, baclofen, budesonide-formoterol, famotidine , ferrous sulfate, fluticasone  furoate-vilanterol, furosemide, losartan, magnesium  oxide, meloxicam , montelukast, naloxone , pantoprazole , potassium chloride SA, pravastatin, and spironolactone  History Review  Allergy: Ms. Jennison is allergic to contrast media [iodinated contrast media], ioxaglate, and shellfish allergy. Drug: Ms. Zuehlke  reports current drug use. Alcohol:  reports no history of alcohol use. Tobacco:  reports that she has never smoked. She has never used smokeless tobacco. Social: Ms. Work  reports that she has never smoked. She has never used smokeless tobacco. She reports current drug use. She reports that she does not drink alcohol. Medical:  has a past medical history of Anemia, Arthritis, COPD (chronic obstructive pulmonary disease) (HCC), Depression, Diabetes mellitus without complication (HCC), GERD (gastroesophageal reflux disease), Hiatal hernia, History of contrast media allergy. (IVP dye) (09/01/2015), History of hiatal hernia, Hypercholesteremia, Hypertension, Morbid obesity (HCC), Scoliosis, and Shortness of breath dyspnea. Surgical: Ms. Purington  has a past surgical history that includes Abdominal hysterectomy; Cataract extraction w/ intraocular lens implant (Right); Gastric bypass; Hernia repair; Cataract extraction w/PHACO (Left, 07/19/2015); Cholecystectomy; and Hernia repair. Family: family history includes Diabetes in her mother; Heart disease in her father.  Laboratory Chemistry  Profile   Renal Lab Results  Component Value Date   BUN 14 09/14/2021   CREATININE 1.02 (H) 09/14/2021   BCR 21 06/17/2019   GFRAA 53 (L) 06/17/2019   GFRNONAA 57 (L) 09/14/2021    Hepatic Lab Results  Component Value Date   AST 19 09/14/2021   ALT 11 09/14/2021   ALBUMIN  3.8 09/14/2021   ALKPHOS 66 09/14/2021   LIPASE 19 10/27/2015    Electrolytes Lab Results  Component Value Date   NA 140 09/14/2021   K 4.3 09/14/2021   CL 102 09/14/2021   CALCIUM 9.3 09/14/2021   MG 1.9 06/17/2019    Bone Lab Results  Component Value Date   25OHVITD1 45 06/17/2019   25OHVITD2 <1.0 06/17/2019   25OHVITD3 45 06/17/2019    Inflammation (CRP: Acute Phase) (ESR: Chronic Phase) Lab Results  Component Value Date   CRP 7 06/17/2019   ESRSEDRATE 49 (H) 06/17/2019   LATICACIDVEN 4.0 (H) 11/23/2009         Note: Above Lab results reviewed.  Recent Imaging Review  DG PAIN  CLINIC C-ARM 1-60 MIN NO REPORT Fluoro was used, but no Radiologist interpretation will be provided.  Please refer to "NOTES" tab for provider progress note. Note: Reviewed         Physical Exam  General appearance: Well nourished, well developed, and well hydrated. In no apparent acute distress Mental status: Alert, oriented x 3 (person, place, & time)       Respiratory: No evidence of acute respiratory distress Eyes: PERLA Vitals: BP (!) 154/97   Pulse 97   Temp (!) 97.3 F (36.3 C)   Resp 16   Ht 5' (1.524 m)   Wt 172 lb (78 kg)   SpO2 98%   BMI 33.59 kg/m  BMI: Estimated body mass index is 33.59 kg/m as calculated from the following:   Height as of this encounter: 5' (1.524 m).   Weight as of this encounter: 172 lb (78 kg). Ideal: Ideal body weight: 45.5 kg (100 lb 4.9 oz) Adjusted ideal body weight: 58.5 kg (128 lb 15.8 oz)  Assessment   Diagnosis Status  1. Chronic bilateral low back pain without sciatica   2. Cervicalgia   3. Degeneration of intervertebral disc of lumbar region with  discogenic back pain   4. Spinal stenosis, lumbar region, with neurogenic claudication   5. Cervical facet joint syndrome   6. Chronic right hip pain   7. Lumbar facet joint syndrome   8. Rheumatoid arthritis involving multiple sites with positive rheumatoid factor (HCC)   9. Chronic musculoskeletal pain   10. Chronic pain syndrome   11. Pharmacologic therapy   12. Chronic use of opiate for therapeutic purpose   13. Encounter for medication management   14. Encounter for chronic pain management    Controlled Controlled Controlled   Plan of Care  Assessment and Plan We will continue on current medication regimen.  Prescribing drug monitoring (PDMP) consistent with prescribed medication.  Urine drug screening (UDS) up-to-date.  She has been managing her muscle spasm with magnesium  supplementation and has also been advised to consume electrolyte-rich drinks, including Gatorade.     Interventional Therapies  Risk  Complexity Considerations:   Allergy: CONTRAST  COPD  BA  CKD  SOBOE  GERD  Jehovah's witness  Hx. intestinal obstruction  CHF  HTN  DM  MO (BMI>30)  No RFA until patient brings BMI down to less than 30 kg/m    Planned  Pending:   Therapeutic bilateral lumbar facet MBB #5     Under consideration:   Therapeutic bilateral lumbar facet MBB #4  Possible bilateral lumbar facet RFA #1 (on hold until BMI is brought down to 30 kg/m)    Completed:   Diagnostic left IA shoulder and AC joint inj. x1 (10/08/2023) (100/100/60/60)  Diagnostic bilateral lumbar facet MBB x3 (01/18/2022) (5/10 to 0/10) (100/100/75/75-100)     Therapeutic  Palliative (PRN) options:   Therapeutic/palliative bilateral lumbar facet MBB    Pharmacotherapy  Nonopioids transferred 11/09/2020: Magnesium , Lyrica , and Mobic .    Pharmacotherapy (Medications Ordered): No orders of the defined types were placed in this encounter.  Orders:  No orders of the defined types were placed in this  encounter.  Follow-up plan:   No follow-ups on file.        Recent Visits Date Type Provider Dept  03/24/24 Procedure visit Renaldo Caroli, MD [facility]  03/02/24 Office Visit Renaldo Caroli, MD [facility]  02/04/24 Office Visit Renaldo Caroli, MD [facility]  01/16/24 Procedure visit Renaldo Caroli,  MD [facility]  Showing recent visits within past 90 days and meeting all other requirements Today's Visits Date Type Provider Dept  03/30/24 Office Visit Lunette Tapp K, NP [facility]  Showing today's visits and meeting all other requirements Future Appointments No visits were found meeting these conditions. Showing future appointments within next 90 days and meeting all other requirements   I discussed the assessment and treatment plan with the patient. The patient was provided an opportunity to ask questions and all were answered. The patient agreed with the plan and demonstrated an understanding of the instructions.  Patient advised to call back or seek an in-person evaluation if the symptoms or condition worsens.  Duration of encounter: 30 minutes.  Total time on encounter, as per AMA guidelines included both the face-to-face and non-face-to-face time personally spent by the physician and/or other qualified health care professional(s) on the day of the encounter (includes time in activities that require the physician or other qualified health care professional and does not include time in activities normally performed by clinical staff). Physician's time may include the following activities when performed: Preparing to see the patient (e.g., pre-charting review of records, searching for previously ordered imaging, lab work, and nerve conduction tests) Review of prior analgesic pharmacotherapies. Reviewing PMP Interpreting ordered tests (e.g., lab work, imaging, nerve conduction tests) Performing post-procedure  evaluations, including interpretation of diagnostic procedures Obtaining and/or reviewing separately obtained history Performing a medically appropriate examination and/or evaluation Counseling and educating the patient/family/caregiver Ordering medications, tests, or procedures Referring and communicating with other health care professionals (when not separately reported) Documenting clinical information in the electronic or other health record Independently interpreting results (not separately reported) and communicating results to the patient/ family/caregiver Care coordination (not separately reported)  Note by: Rendy Lazard K Syncere Eble, NP (TTS and AI technology used. I apologize for any typographical errors that were not detected and corrected.) Date: 03/30/2024; Time: 10:31 AM

## 2024-03-30 NOTE — Progress Notes (Signed)
 Nursing Pain Medication Assessment:  Safety precautions to be maintained throughout the outpatient stay will include: orient to surroundings, keep bed in low position, maintain call bell within reach at all times, provide assistance with transfer out of bed and ambulation.  Medication Inspection Compliance: Pill count conducted under aseptic conditions, in front of the patient. Neither the pills nor the bottle was removed from the patient's sight at any time. Once count was completed pills were immediately returned to the patient in their original bottle.  Medication: Hydrocodone /APAP Pill/Patch Count: 5 of 90 pills/patches remain Pill/Patch Appearance: Markings consistent with prescribed medication Bottle Appearance: Standard pharmacy container. Clearly labeled. Filled Date: 4 / 65 / 2025 Last Medication intake:  Today

## 2024-03-30 NOTE — Progress Notes (Signed)
 Department: [facility] Date: 03/30/2024  Note: Case reviewed as supervising provider.  Post-procedure evaluation  Type: Lumbar Facet, Medial Branch Block(s) #5  Laterality: Bilateral  Level: L2, L3, L4, L5, and S1 Medial Branch Level(s). Injecting these levels blocks the L3-4, L4-5, and L5-S1 lumbar facet joints.  Imaging: Fluoroscopic guidance Spinal ([id number]) Anesthesia: Local anesthesia (1-2% Lidocaine ) Anxiolysis: IV Versed  2.0 mg Sedation: Moderate Sedation None required. No Fentanyl  administered.         DOS: 03/24/2024 Performed by: Candi Chafe, MD  Primary Purpose: Diagnostic/Therapeutic Indications: Low back pain severe enough to impact quality of life or function. 1. Chronic low back pain (1ry area of Pain) (Bilateral) (R>L) w/o sciatica   2. DDD (degenerative disc disease), lumbar w/ LBP   3. 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 Lumbar facet joint pain   5. Lumbar facet hypertrophy   6. Lumbar facet syndrome (Bilateral) (R>L)   7. Spondylosis without myelopathy or radiculopathy, lumbosacral region    NAS-11 Pain score:   Pre-procedure: 7 /10   Post-procedure: 0-No pain/10    Effectiveness:  Initial hour after procedure: 100 %. Subsequent 4-6 hours post-procedure: 100 %. Analgesia past initial 6 hours: 75 % (current). Ongoing improvement:  Analgesic: The patient indicates having attained 100% relief of the pain for the duration of the local anesthetic followed by an ongoing 75% improvement. Function: Ms. Massie reports improvement in function ROM: Ms. Kast reports improvement in ROM

## 2024-04-06 ENCOUNTER — Ambulatory Visit: Admitting: Pain Medicine

## 2024-04-15 ENCOUNTER — Ambulatory Visit: Admitting: Pain Medicine

## 2024-06-01 ENCOUNTER — Other Ambulatory Visit: Payer: Self-pay

## 2024-06-01 ENCOUNTER — Telehealth: Payer: Self-pay

## 2024-06-01 DIAGNOSIS — M48062 Spinal stenosis, lumbar region with neurogenic claudication: Secondary | ICD-10-CM

## 2024-06-01 DIAGNOSIS — Z79899 Other long term (current) drug therapy: Secondary | ICD-10-CM

## 2024-06-01 DIAGNOSIS — M47812 Spondylosis without myelopathy or radiculopathy, cervical region: Secondary | ICD-10-CM

## 2024-06-01 DIAGNOSIS — G8929 Other chronic pain: Secondary | ICD-10-CM

## 2024-06-01 DIAGNOSIS — M7918 Myalgia, other site: Secondary | ICD-10-CM

## 2024-06-01 DIAGNOSIS — G894 Chronic pain syndrome: Secondary | ICD-10-CM

## 2024-06-01 DIAGNOSIS — M0579 Rheumatoid arthritis with rheumatoid factor of multiple sites without organ or systems involvement: Secondary | ICD-10-CM

## 2024-06-01 DIAGNOSIS — Z79891 Long term (current) use of opiate analgesic: Secondary | ICD-10-CM

## 2024-06-01 DIAGNOSIS — M5136 Other intervertebral disc degeneration, lumbar region with discogenic back pain only: Secondary | ICD-10-CM

## 2024-06-01 DIAGNOSIS — M47816 Spondylosis without myelopathy or radiculopathy, lumbar region: Secondary | ICD-10-CM

## 2024-06-01 DIAGNOSIS — M542 Cervicalgia: Secondary | ICD-10-CM

## 2024-06-01 MED ORDER — HYDROCODONE-ACETAMINOPHEN 10-325 MG PO TABS: 1.0000 | ORAL_TABLET | Freq: Three times a day (TID) | ORAL | 0 refills | Status: DC

## 2024-06-01 NOTE — Telephone Encounter (Signed)
 Telephone call from patient stating that she received a call from [facility] stating that they could not fill her Hydrocodone  this month. She is asking if the medication can be sent to [facility] on [address].

## 2024-06-01 NOTE — Telephone Encounter (Signed)
 Called and let patient know that medication has been sent to where she asked us .

## 2024-06-01 NOTE — Telephone Encounter (Signed)
 Sent to S Patel,NP

## 2024-06-04 ENCOUNTER — Telehealth: Payer: Self-pay | Admitting: Nurse Practitioner

## 2024-06-04 NOTE — Telephone Encounter (Signed)
 Patient picked up her Vicodan and was shorted 6 tablets. She called [facility] and was told she could come back 2 days early and they would fill the next script. She just wanted to let someone know what was going on with [facility]. She wants all scripts sent to [facility].

## 2024-06-04 NOTE — Telephone Encounter (Signed)
 Pharmacy changed to [facility] clinic

## 2024-06-25 ENCOUNTER — Encounter: Payer: Self-pay | Admitting: Nurse Practitioner

## 2024-06-25 ENCOUNTER — Ambulatory Visit: Attending: Nurse Practitioner | Admitting: Nurse Practitioner

## 2024-06-25 VITALS — BP 153/87 | HR 94 | Temp 97.8°F | Resp 16 | Ht 60.0 in | Wt 186.0 lb

## 2024-06-25 DIAGNOSIS — M25551 Pain in right hip: Secondary | ICD-10-CM | POA: Insufficient documentation

## 2024-06-25 DIAGNOSIS — M48062 Spinal stenosis, lumbar region with neurogenic claudication: Secondary | ICD-10-CM | POA: Insufficient documentation

## 2024-06-25 DIAGNOSIS — G8929 Other chronic pain: Secondary | ICD-10-CM | POA: Diagnosis present

## 2024-06-25 DIAGNOSIS — M47816 Spondylosis without myelopathy or radiculopathy, lumbar region: Secondary | ICD-10-CM | POA: Diagnosis present

## 2024-06-25 DIAGNOSIS — M542 Cervicalgia: Secondary | ICD-10-CM | POA: Diagnosis present

## 2024-06-25 DIAGNOSIS — M5136 Other intervertebral disc degeneration, lumbar region with discogenic back pain only: Secondary | ICD-10-CM | POA: Diagnosis present

## 2024-06-25 DIAGNOSIS — Z79899 Other long term (current) drug therapy: Secondary | ICD-10-CM | POA: Insufficient documentation

## 2024-06-25 DIAGNOSIS — M7918 Myalgia, other site: Secondary | ICD-10-CM | POA: Insufficient documentation

## 2024-06-25 DIAGNOSIS — M0579 Rheumatoid arthritis with rheumatoid factor of multiple sites without organ or systems involvement: Secondary | ICD-10-CM | POA: Insufficient documentation

## 2024-06-25 DIAGNOSIS — Z79891 Long term (current) use of opiate analgesic: Secondary | ICD-10-CM | POA: Diagnosis present

## 2024-06-25 DIAGNOSIS — G894 Chronic pain syndrome: Secondary | ICD-10-CM | POA: Insufficient documentation

## 2024-06-25 DIAGNOSIS — M47812 Spondylosis without myelopathy or radiculopathy, cervical region: Secondary | ICD-10-CM | POA: Insufficient documentation

## 2024-06-25 DIAGNOSIS — M545 Low back pain, unspecified: Secondary | ICD-10-CM | POA: Insufficient documentation

## 2024-06-25 MED ORDER — HYDROCODONE-ACETAMINOPHEN 10-325 MG PO TABS: 1.0000 | ORAL_TABLET | Freq: Three times a day (TID) | ORAL | 0 refills | Status: DC

## 2024-06-25 NOTE — Progress Notes (Signed)
 PROVIDER NOTE: Interpretation of information contained herein should be left to medically-trained personnel. Specific patient instructions are provided elsewhere under Patient Instructions section of medical record. This document was created in part using AI and STT-dictation technology, any transcriptional errors that may result from this process are unintentional.  Patient: Mariah Poole  Service: E/M   PCP: Buren Rock HERO, MD  DOB: May 27, 1946  DOS: 06/25/2024  Provider: Emmy MARLA Blanch, NP  MRN: [id number]  Delivery: Face-to-face  Specialty: Interventional Pain Management  Type: Established Patient  Setting: Ambulatory outpatient facility  Specialty designation: 09  Referring Prov.: Buren Rock HERO, MD  Location: Outpatient office facility       History of present illness (HPI) Mariah Poole, a 78 y.o. 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 Mariah Poole primary complain today is Back Pain  Pertinent problems: Mariah Poole has   Pain Assessment: Severity of Chronic pain is reported as a 3 /10. Location: Back Lower/TO OUTER ASPECTS OF THIGHS, ALTERNATING RIGHT OR LEFT, NOT AT THE SAME TIME. Onset: More than a month ago. Quality: Aching, Throbbing. Timing: Constant. Modifying factor(s): meds. Vitals:  height is 5' (1.524 m) and weight is 186 lb (84.4 kg). Her temperature is 97.8 F (36.6 C). Her blood pressure is 153/87 (abnormal) and her pulse is 94. Her respiration is 16 and oxygen saturation is 100%.  BMI: Estimated body mass index is 36.33 kg/m as calculated from the following:   Height as of this encounter: 5' (1.524 m).   Weight as of this encounter: 186 lb (84.4 kg).  Last encounter: 03/30/2024. Last procedure: 03/24/2024  Reason for encounter: medication management.  The patient doing well with current medication regimen.  No adverse reaction or side effects reported to the medication.  The patient has a history of rheumatoid  arthritis and reported improvement in her symptoms following the procedure.  However, she experiences recurrent low back and bilateral hand pain, particularly with changes in the weather.   Of note, the patient underwent therapeutic and palliative bilateral lumbar facet block on Mar 24, 2024.  She reported 100% pain relief during the duration of the local anesthetic and continues to experience approximately 75% ongoing improvement in her symptoms after the procedure.   The patient is experiencing a flareup of her low back pain and expressed interest in repeating the lumbar facet radiofrequency ablation (RFA).   Pharmacotherapy Assessment   Hydrocodone -acetaminophen  (Norco) 10-325 mg tablet every 8 hours as needed for pain. MME=30 Monitoring: [place] PMP: PDMP reviewed during this encounter.       Pharmacotherapy: No side-effects or adverse reactions reported. Compliance: No problems identified. Effectiveness: Clinically acceptable.  Mariah Pulling, RN  06/25/2024  2:01 PM  Sign when Signing Visit Nursing Pain Medication Assessment:  Safety precautions to be maintained throughout the outpatient stay will include: orient to surroundings, keep bed in low position, maintain call bell within reach at all times, provide assistance with transfer out of bed and ambulation.  Medication Inspection Compliance: Pill count conducted under aseptic conditions, in front of the patient. Neither the pills nor the bottle was removed from the patient's sight at any time. Once count was completed pills were immediately returned to the patient in their original bottle.  Medication: Hydrocodone /APAP Pill/Patch Count: 12 of 90 pills/patches remain Pill/Patch Appearance: Markings consistent with prescribed medication Bottle Appearance: Standard pharmacy container. Clearly labeled. Filled Date: 7 / 83 / 2025 Last Medication intake:  Today  UDS:  Summary  Date Value Ref Range Status  07/10/2023 Note  Final    Comment:     ==================================================================== [employer] Select 13 (MW) ==================================================================== Test                             Result       Flag       Units  Drug Present and Declared for Prescription Verification   Hydrocodone                     2209         EXPECTED   ng/mg creat   Hydromorphone                  439          EXPECTED   ng/mg creat   Dihydrocodeine                 596          EXPECTED   ng/mg creat   Norhydrocodone                 4188         EXPECTED   ng/mg creat    Sources of hydrocodone  include scheduled prescription medications.    Hydromorphone, dihydrocodeine and norhydrocodone are expected    metabolites of hydrocodone . Hydromorphone and dihydrocodeine are    also available as scheduled prescription medications.  ==================================================================== Test                      Result    Flag   Units      Ref Range   Creatinine              57               mg/dL      >=79 ==================================================================== Declared Medications:  The flagging and interpretation on this report are based on the  following declared medications.  Unexpected results may arise from  inaccuracies in the declared medications.   **Note: The testing scope of this panel includes these medications:   Hydrocodone  (Norco)   **Note: The testing scope of this panel does not include the  following reported medications:   Acetaminophen  (Norco)  Albuterol (Ventolin HFA)  Atenolol (Tenormin)  Budesonide (Symbicort )  Duloxetine (Cymbalta)  Famotidine  (Pepcid )  Formoterol (Symbicort )  Furosemide (Lasix)  Iron  Losartan (Cozaar)  Magnesium  (Mag-Ox)  Meloxicam  (Mobic )  Montelukast (Singulair)  Naloxone  (Narcan )  Pantoprazole  (Protonix )  Potassium (Klor-Con)  Pravastatin (Pravachol)  Semaglutide (Ozempic)  Spironolactone (Aldactone)  Topical  Lidocaine   Turmeric  Vitamin C  Vitamin D3 ==================================================================== For clinical consultation, please call [phone]. ====================================================================     No results found for: CBDTHCR No results found for: D8THCCBX No results found for: D9THCCBX  ROS  Constitutional: Denies any fever or chills Gastrointestinal: No reported hemesis, hematochezia, vomiting, or acute GI distress Musculoskeletal: Denies any acute onset joint swelling, redness, loss of ROM, or weakness Neurological: No reported episodes of acute onset apraxia, aphasia, dysarthria, agnosia, amnesia, paralysis, loss of coordination, or loss of consciousness  Medication Review  DULoxetine, HYDROcodone -acetaminophen , Lidocaine , Semaglutide(0.25 or 0.5MG /DOS), Turmeric Curcumin, Vitamin D3, albuterol, ascorbic acid, atenolol, baclofen, budesonide-formoterol, famotidine , ferrous sulfate, fluticasone  furoate-vilanterol, furosemide, losartan, magnesium  oxide, meloxicam , montelukast, naloxone , pantoprazole , potassium chloride SA, pravastatin, and spironolactone  History Review  Allergy: Mariah Poole is allergic to contrast media [iodinated contrast  media], ioxaglate, and shellfish allergy. Drug: Mariah Poole  reports current drug use. Alcohol:  reports no history of alcohol use. Tobacco:  reports that she has never smoked. She has never used smokeless tobacco. Social: Mariah Poole  reports that she has never smoked. She has never used smokeless tobacco. She reports current drug use. She reports that she does not drink alcohol. Medical:  has a past medical history of Anemia, Arthritis, COPD (chronic obstructive pulmonary disease) (HCC), Depression, Diabetes mellitus without complication (HCC), GERD (gastroesophageal reflux disease), Hiatal hernia, History of contrast media allergy. (IVP dye) (09/01/2015), History of hiatal hernia,  Hypercholesteremia, Hypertension, Morbid obesity (HCC), Scoliosis, and Shortness of breath dyspnea. Surgical: Mariah Poole  has a past surgical history that includes Abdominal hysterectomy; Cataract extraction w/ intraocular lens implant (Right); Gastric bypass; Hernia repair; Cataract extraction w/PHACO (Left, 07/19/2015); Cholecystectomy; and Hernia repair. Family: family history includes Diabetes in her mother; Heart disease in her father.  Laboratory Chemistry Profile   Renal Lab Results  Component Value Date   BUN 14 09/14/2021   CREATININE 1.02 (H) 09/14/2021   BCR 21 06/17/2019   GFRAA 53 (L) 06/17/2019   GFRNONAA 57 (L) 09/14/2021    Hepatic Lab Results  Component Value Date   AST 19 09/14/2021   ALT 11 09/14/2021   ALBUMIN  3.8 09/14/2021   ALKPHOS 66 09/14/2021   LIPASE 19 10/27/2015    Electrolytes Lab Results  Component Value Date   NA 140 09/14/2021   K 4.3 09/14/2021   CL 102 09/14/2021   CALCIUM 9.3 09/14/2021   MG 1.9 06/17/2019    Bone Lab Results  Component Value Date   25OHVITD1 45 06/17/2019   25OHVITD2 <1.0 06/17/2019   25OHVITD3 45 06/17/2019    Inflammation (CRP: Acute Phase) (ESR: Chronic Phase) Lab Results  Component Value Date   CRP 7 06/17/2019   ESRSEDRATE 49 (H) 06/17/2019   LATICACIDVEN 4.0 (H) 11/23/2009         Note: Above Lab results reviewed.  Recent Imaging Review  DG PAIN CLINIC C-ARM 1-60 MIN NO REPORT Fluoro was used, but no Radiologist interpretation will be provided.  Please refer to NOTES tab for provider progress note. Note: Reviewed        Physical Exam  Vitals: BP (!) 153/87   Pulse 94   Temp 97.8 F (36.6 C)   Resp 16   Ht 5' (1.524 m)   Wt 186 lb (84.4 kg)   SpO2 100%   BMI 36.33 kg/m  BMI: Estimated body mass index is 36.33 kg/m as calculated from the following:   Height as of this encounter: 5' (1.524 m).   Weight as of this encounter: 186 lb (84.4 kg). Ideal: Ideal body weight: 45.5 kg (100 lb 4.9  oz) Adjusted ideal body weight: 61 kg (134 lb 9.4 oz) General appearance: Well nourished, well developed, and well hydrated. In no apparent acute distress Mental status: Alert, oriented x 3 (person, place, & time)       Respiratory: No evidence of acute respiratory distress Eyes: PERLA  Assessment   Diagnosis Status  1. Lumbar facet hypertrophy   2. Chronic bilateral low back pain without sciatica   3. Lumbar facet joint syndrome   4. Cervicalgia   5. Degeneration of intervertebral disc of lumbar region with discogenic back pain   6. Spinal stenosis, lumbar region, with neurogenic claudication   7. Cervical facet joint syndrome   8. Chronic right hip pain   9. Rheumatoid arthritis  involving multiple sites with positive rheumatoid factor (HCC)   10. Chronic musculoskeletal pain   11. Chronic pain syndrome   12. Pharmacologic therapy   13. Chronic use of opiate for therapeutic purpose   14. Encounter for medication management   15. Encounter for chronic pain management    Having a Flare-up Having a Flare-up Controlled   Updated Problems: No problems updated.  Plan of Care  Problem-specific:  Assessment and Plan  We will continue on current medication regimen.  Prescribing drug monitoring (PDMP) consistent with prescribed medication.  Urine drug screening (UDS) up-to-date.  She has been managing her muscle spasm with magnesium  supplementation and has also been advised to consume electrolyte-rich drinks, including Gatorade.   Interventional Therapies  Risk  Complexity Considerations:   Allergy: CONTRAST  COPD  BA  CKD  SOBOE  GERD  Jehovah's witness  Hx. intestinal obstruction  CHF  HTN  DM  MO (BMI>30)  No RFA until patient brings BMI down to less than 30 kg/m    Planned  Pending:   Therapeutic bilateral lumbar facet MBB #5     Under consideration:   Therapeutic bilateral lumbar facet MBB #4  Possible bilateral lumbar facet RFA #1 (on hold until BMI is  brought down to 30 kg/m)    Completed:   Diagnostic left IA shoulder and AC joint inj. x1 (10/08/2023) (100/100/60/60)  Diagnostic bilateral lumbar facet MBB x3 (01/18/2022) (5/10 to 0/10) (100/100/75/75-100)     Therapeutic  Palliative (PRN) options:   Therapeutic/palliative bilateral lumbar facet MBB    Pharmacotherapy  Nonopioids transferred 11/09/2020: Magnesium , Lyrica , and Mobic .    Mariah Poole has a current medication list which includes the following long-term medication(s): albuterol, atenolol, duloxetine, famotidine , ferosul, furosemide, montelukast, pantoprazole , potassium chloride sa, pravastatin, spironolactone, symbicort , [START ON 07/03/2024] hydrocodone -acetaminophen , [START ON 08/02/2024] hydrocodone -acetaminophen , and [START ON 09/01/2024] hydrocodone -acetaminophen .  Pharmacotherapy (Medications Ordered): Meds ordered this encounter  Medications   HYDROcodone -acetaminophen  (NORCO) 10-325 MG tablet    Sig: Take 1 tablet by mouth every 8 (eight) hours. Must last 30 days    Dispense:  90 tablet    Refill:  0    DO NOT: delete (not duplicate); no partial-fill (will deny script to complete), no refill request (F/U required). DISPENSE: 1 day early if closed on fill date. WARN: No CNS-depressants within 8 hrs of med.   HYDROcodone -acetaminophen  (NORCO) 10-325 MG tablet    Sig: Take 1 tablet by mouth every 8 (eight) hours. Must last 30 days    Dispense:  90 tablet    Refill:  0    DO NOT: delete (not duplicate); no partial-fill (will deny script to complete), no refill request (F/U required). DISPENSE: 1 day early if closed on fill date. WARN: No CNS-depressants within 8 hrs of med.   HYDROcodone -acetaminophen  (NORCO) 10-325 MG tablet    Sig: Take 1 tablet by mouth every 8 (eight) hours. Must last 30 days    Dispense:  90 tablet    Refill:  0    DO NOT: delete (not duplicate); no partial-fill (will deny script to complete), no refill request (F/U required). DISPENSE:  1 day early if closed on fill date. WARN: No CNS-depressants within 8 hrs of med.   Orders:  Orders Placed This Encounter  Procedures   LUMBAR FACET(MEDIAL BRANCH NERVE BLOCK) MBNB    Diagnosis: Lumbar Facet Syndrome (M47.816); Lumbosacral Facet Syndrome (M47.817); Lumbar Facet Joint Pain (M54.59) Medical Necessity Statement: 1.Severe chronic axial low back pain causing  functional impairment documented by ongoing pain scale assessments. 2.Pain present for longer than 3 months (Chronic) documented to have failed noninvasive conservative therapies. 3.Absence of untreated radiculopathy. 4.There is no radiological evidence of untreated fractures, tumor, infection, or deformity.  Physical Examination Findings: Positive Kemp Maneuver: (Y)  Positive Lumbar Hyperextension-Rotation provocative test: (Y)    Standing Status:   Future    Expiration Date:   09/25/2024    Scheduling Instructions:     Procedure: Lumbar facet Block     Type: Medial Branch Block     Side: Bilateral     Purpose: Diagnostic/Therapeutic     Level(s): L3-4, L4-5, L5-S1, and TBD by Fluoroscopic Mapping Facets (L1, L2, L3, L4, L5, S1, and TBD Medial Branch)     Sedation: With Sedation.     Timeframe: As soon as schedule allows.    Where will this procedure be performed?:   [facility]   [employer] Select 13 (MW), Urine    Volume: 30 ml(s). Minimum 3 ml of urine is needed. Document temperature of fresh sample. Indications: Long term (current) use of opiate analgesic (S20.108)    Release to patient:   Immediate        Return in about 3 months (around 09/25/2024) for (F2F), (MM), Emmy Blanch NP.    Recent Visits Date Type Provider Dept  03/30/24 Office Visit Wenceslaus Gist K, NP [facility]  Showing recent visits within past 90 days and meeting all other requirements Today's Visits Date Type Provider Dept  06/25/24 Office Visit Daniell Mancinas K, NP [facility]  Showing today's visits and  meeting all other requirements Future Appointments No visits were found meeting these conditions. Showing future appointments within next 90 days and meeting all other requirements  I discussed the assessment and treatment plan with the patient. The patient was provided an opportunity to ask questions and all were answered. The patient agreed with the plan and demonstrated an understanding of the instructions.  Patient advised to call back or seek an in-person evaluation if the symptoms or condition worsens.  Duration of encounter: 30 minutes.  Total time on encounter, as per AMA guidelines included both the face-to-face and non-face-to-face time personally spent by the physician and/or other qualified health care professional(s) on the day of the encounter (includes time in activities that require the physician or other qualified health care professional and does not include time in activities normally performed by clinical staff). Physician's time may include the following activities when performed: Preparing to see the patient (e.g., pre-charting review of records, searching for previously ordered imaging, lab work, and nerve conduction tests) Review of prior analgesic pharmacotherapies. Reviewing PMP Interpreting ordered tests (e.g., lab work, imaging, nerve conduction tests) Performing post-procedure evaluations, including interpretation of diagnostic procedures Obtaining and/or reviewing separately obtained history Performing a medically appropriate examination and/or evaluation Counseling and educating the patient/family/caregiver Ordering medications, tests, or procedures Referring and communicating with other health care professionals (when not separately reported) Documenting clinical information in the electronic or other health record Independently interpreting results (not separately reported) and communicating results to the patient/ family/caregiver Care coordination (not  separately reported)  Note by: Barnie Sopko K Gilles Trimpe, NP (TTS and AI technology used. I apologize for any typographical errors that were not detected and corrected.) Date: 06/25/2024; Time: 2:59 PM

## 2024-06-25 NOTE — Progress Notes (Signed)
 Nursing Pain Medication Assessment:  Safety precautions to be maintained throughout the outpatient stay will include: orient to surroundings, keep bed in low position, maintain call bell within reach at all times, provide assistance with transfer out of bed and ambulation.  Medication Inspection Compliance: Pill count conducted under aseptic conditions, in front of the patient. Neither the pills nor the bottle was removed from the patient's sight at any time. Once count was completed pills were immediately returned to the patient in their original bottle.  Medication: Hydrocodone /APAP Pill/Patch Count: 12 of 90 pills/patches remain Pill/Patch Appearance: Markings consistent with prescribed medication Bottle Appearance: Standard pharmacy container. Clearly labeled. Filled Date: 7 / 50 / 2025 Last Medication intake:  Today

## 2024-06-25 NOTE — Patient Instructions (Addendum)
 Moderate Conscious Sedation, Adult Sedation is the use of medicines to help you relax and not feel pain. Moderate conscious sedation is a type of sedation that makes you less alert than normal. You are still able to respond to instructions, touch, or both. This type of sedation is used during short medical and dental procedures. It is milder than deep sedation, which is a type of sedation you cannot be easily woken up from. It is also milder than general anesthesia, which is the use of medicines to make you fall asleep. Moderate conscious sedation lets you return to your normal activities sooner. Tell a health care provider about: Any allergies you have. All medicines you are taking, including vitamins, herbs, steroids, eye drops, creams, and over-the-counter medicines. Any problems you or family members have had with anesthesia. Any bleeding problems you have. Any surgeries you have had. Any medical conditions you have. Whether you are pregnant or may be pregnant. Any recent alcohol, tobacco, or drug use. What are the risks? Your health care provider will talk with you about risks. These may include: Oversedation. This is when you get too much medicine. Nausea or vomiting. Allergic reaction to medicines. Trouble breathing. If this happens, a breathing tube may be used. It will be removed when you can breathe better on your own. Heart trouble. Lung trouble. Emergence delirium. This is when you feel confused while the sedation wears off. This gets better with time. What happens before the procedure? When to stop eating and drinking Follow instructions from your health care provider about what you may eat and drink. These may include: 8 hours before your procedure Stop eating most foods. Do not eat meat, fried foods, or fatty foods. Eat only light foods, such as toast or crackers. All liquids are okay except energy drinks and alcohol. 6 hours before your procedure Stop eating. Drink only  clear liquids, such as water, clear fruit juice, black coffee, plain tea, and sports drinks. Do not drink energy drinks or alcohol. 2 hours before your procedure Stop drinking all liquids. You may be allowed to take medicines with small sips of water. If you do not follow your health care provider's instructions, your procedure may be delayed or canceled. Medicines Ask your health care provider about: Changing or stopping your regular medicines. These include any diabetes medicines or blood thinners you take. Taking medicines such as aspirin and ibuprofen. These medicines can thin your blood. Do not take them unless your health care provider tells you to. Taking over-the-counter medicines, vitamins, herbs, and supplements. Tests and exams You may have an exam or testing. You may have a blood or urine sample taken. General instructions Do not use any products that contain nicotine or tobacco for at least 4 weeks before the procedure. These products include cigarettes, chewing tobacco, and vaping devices, such as e-cigarettes. If you need help quitting, ask your health care provider. If you will be going home right after the procedure, plan to have a responsible adult: Take you home from the hospital or clinic. You will not be allowed to drive. Care for you for the time you are told. What happens during the procedure?  You will be given the sedative. It may be given: As a pill you can take by mouth. It can also be put into the rectum. As a spray through the nose. As an injection into muscle. As an injection into a vein through an IV. You may be given oxygen as needed. Your blood pressure, heart  rate, breathing rate, and blood oxygen level will be monitored during the procedure. The medical or dental procedure will be done. The procedure may vary among health care providers and hospitals. What happens after the procedure? Your blood pressure, heart rate, breathing rate, and blood oxygen  level will be monitored until you leave the hospital or clinic. You will get fluids through an IV as needed. Do not drive or operate machinery until your health care provider says that it is safe. This information is not intended to replace advice given to you by your health care provider. Make sure you discuss any questions you have with your health care provider. Document Revised: 05/21/2022 Document Reviewed: 05/21/2022 Elsevier Patient Education  2024 [employer].GENERAL RISKS AND COMPLICATIONS  What are the risk, side effects and possible complications? Generally speaking, most procedures are safe.  However, with any procedure there are risks, side effects, and the possibility of complications.  The risks and complications are dependent upon the sites that are lesioned, or the type of nerve block to be performed.  The closer the procedure is to the spine, the more serious the risks are.  Great care is taken when placing the radio frequency needles, block needles or lesioning probes, but sometimes complications can occur. Infection: Any time there is an injection through the skin, there is a risk of infection.  This is why sterile conditions are used for these blocks.  There are four possible types of infection. Localized skin infection. Central Nervous System Infection-This can be in the form of Meningitis, which can be deadly. Epidural Infections-This can be in the form of an epidural abscess, which can cause pressure inside of the spine, causing compression of the spinal cord with subsequent paralysis. This would require an emergency surgery to decompress, and there are no guarantees that the patient would recover from the paralysis. Discitis-This is an infection of the intervertebral discs.  It occurs in about 1% of discography procedures.  It is difficult to treat and it may lead to surgery.        2. Pain: the needles have to go through skin and soft tissues, will cause soreness.        3. Damage to internal structures:  The nerves to be lesioned may be near blood vessels or    other nerves which can be potentially damaged.       4. Bleeding: Bleeding is more common if the patient is taking blood thinners such as  aspirin, Coumadin, Ticiid, Plavix, etc., or if he/she have some genetic predisposition  such as hemophilia. Bleeding into the spinal canal can cause compression of the spinal  cord with subsequent paralysis.  This would require an emergency surgery to  decompress and there are no guarantees that the patient would recover from the  paralysis.       5. Pneumothorax:  Puncturing of a lung is a possibility, every time a needle is introduced in  the area of the chest or upper back.  Pneumothorax refers to free air around the  collapsed lung(s), inside of the thoracic cavity (chest cavity).  Another two possible  complications related to a similar event would include: Hemothorax and Chylothorax.   These are variations of the Pneumothorax, where instead of air around the collapsed  lung(s), you may have blood or chyle, respectively.       6. Spinal headaches: They may occur with any procedures in the area of the spine.       7.  Persistent CSF (Cerebro-Spinal Fluid) leakage: This is a rare problem, but may occur  with prolonged intrathecal or epidural catheters either due to the formation of a fistulous  track or a dural tear.       8. Nerve damage: By working so close to the spinal cord, there is always a possibility of  nerve damage, which could be as serious as a permanent spinal cord injury with  paralysis.       9. Death:  Although rare, severe deadly allergic reactions known as Anaphylactic  reaction can occur to any of the medications used.      10. Worsening of the symptoms:  We can always make thing worse.  What are the chances of something like this happening? Chances of any of this occuring are extremely low.  By statistics, you have more of a chance of getting killed in a  motor vehicle accident: while driving to the hospital than any of the above occurring .  Nevertheless, you should be aware that they are possibilities.  In general, it is similar to taking a shower.  Everybody knows that you can slip, hit your head and get killed.  Does that mean that you should not shower again?  Nevertheless always keep in mind that statistics do not mean anything if you happen to be on the wrong side of them.  Even if a procedure has a 1 (one) in a 1,000,000 (million) chance of going wrong, it you happen to be that one..Also, keep in mind that by statistics, you have more of a chance of having something go wrong when taking medications.  Who should not have this procedure? If you are on a blood thinning medication (e.g. Coumadin, Plavix, see list of Blood Thinners), or if you have an active infection going on, you should not have the procedure.  If you are taking any blood thinners, please inform your physician.  How should I prepare for this procedure? Do not eat or drink anything at least six hours prior to the procedure. Bring a driver with you .  It cannot be a taxi. Come accompanied by an adult that can drive you back, and that is strong enough to help you if your legs get weak or numb from the local anesthetic. Take all of your medicines the morning of the procedure with just enough water to swallow them. If you have diabetes, make sure that you are scheduled to have your procedure done first thing in the morning, whenever possible. If you have diabetes, take only half of your insulin dose and notify our nurse that you have done so as soon as you arrive at the clinic. If you are diabetic, but only take blood sugar pills (oral hypoglycemic), then do not take them on the morning of your procedure.  You may take them after you have had the procedure. Do not take aspirin or any aspirin-containing medications, at least eleven (11) days prior to the procedure.  They may prolong  bleeding. Wear loose fitting clothing that may be easy to take off and that you would not mind if it got stained with Betadine  or blood. Do not wear any jewelry or perfume Remove any nail coloring.  It will interfere with some of our monitoring equipment.  NOTE: Remember that this is not meant to be interpreted as a complete list of all possible complications.  Unforeseen problems may occur.  BLOOD THINNERS The following drugs contain aspirin or other products, which can cause increased  bleeding during surgery and should not be taken for 2 weeks prior to and 1 week after surgery.  If you should need take something for relief of minor pain, you may take acetaminophen  which is found in Tylenol ,m Datril, Anacin-3 and Panadol. It is not blood thinner. The products listed below are.  Do not take any of the products listed below in addition to any listed on your instruction sheet.  A.P.C or A.P.C with Codeine Codeine Phosphate Capsules #3 Ibuprofen Ridaura  ABC compound Congesprin Imuran rimadil  Advil Cope Indocin Robaxisal  Alka-Seltzer Effervescent Pain Reliever and Antacid Coricidin or Coricidin-D  Indomethacin Rufen  Alka-Seltzer plus Cold Medicine Cosprin Ketoprofen S-A-C Tablets  Anacin Analgesic Tablets or Capsules Coumadin Korlgesic Salflex  Anacin Extra Strength Analgesic tablets or capsules CP-2 Tablets Lanoril Salicylate  Anaprox Cuprimine Capsules Levenox Salocol  Anexsia-D Dalteparin Magan Salsalate  Anodynos Darvon compound Magnesium  Salicylate Sine-off  Ansaid Dasin Capsules Magsal Sodium Salicylate  Anturane Depen Capsules Marnal Soma  APF Arthritis pain formula Dewitt's Pills Measurin Stanback  Argesic Dia-Gesic Meclofenamic Sulfinpyrazone  Arthritis Bayer Timed Release Aspirin Diclofenac Meclomen Sulindac  Arthritis pain formula Anacin Dicumarol Medipren Supac  Analgesic (Safety coated) Arthralgen Diffunasal Mefanamic Suprofen  Arthritis Strength Bufferin Dihydrocodeine Mepro  Compound Suprol  Arthropan liquid Dopirydamole Methcarbomol with Aspirin Synalgos  ASA tablets/Enseals Disalcid Micrainin Tagament  Ascriptin Doan's Midol Talwin  Ascriptin A/D Dolene Mobidin Tanderil  Ascriptin Extra Strength Dolobid Moblgesic Ticlid  Ascriptin with Codeine Doloprin or Doloprin with Codeine Momentum Tolectin  Asperbuf Duoprin Mono-gesic Trendar  Aspergum Duradyne Motrin or Motrin IB Triminicin  Aspirin plain, buffered or enteric coated Durasal Myochrisine Trigesic  Aspirin Suppositories Easprin Nalfon Trillsate  Aspirin with Codeine Ecotrin Regular or Extra Strength Naprosyn Uracel  Atromid-S Efficin Naproxen Ursinus  Auranofin Capsules Elmiron Neocylate Vanquish  Axotal Emagrin Norgesic Verin  Azathioprine Empirin or Empirin with Codeine Normiflo Vitamin E  Azolid Emprazil Nuprin Voltaren  Bayer Aspirin plain, buffered or children's or timed BC Tablets or powders Encaprin Orgaran Warfarin Sodium  Buff-a-Comp Enoxaparin Orudis Zorpin  Buff-a-Comp with Codeine Equegesic Os-Cal-Gesic   Buffaprin Excedrin plain, buffered or Extra Strength Oxalid   Bufferin Arthritis Strength Feldene Oxphenbutazone   Bufferin plain or Extra Strength Feldene Capsules Oxycodone with Aspirin   Bufferin with Codeine Fenoprofen Fenoprofen Pabalate or Pabalate-SF   Buffets II Flogesic Panagesic   Buffinol plain or Extra Strength Florinal or Florinal with Codeine Panwarfarin   Buf-Tabs Flurbiprofen Penicillamine   Butalbital Compound Four-way cold tablets Penicillin   Butazolidin Fragmin Pepto-Bismol   Carbenicillin Geminisyn Percodan   Carna Arthritis Reliever Geopen Persantine   Carprofen Gold's salt Persistin   Chloramphenicol Goody's Phenylbutazone   Chloromycetin Haltrain Piroxlcam   Clmetidine heparin Plaquenil   Cllnoril Hyco-pap Ponstel   Clofibrate Hydroxy chloroquine Propoxyphen         Before stopping any of these medications, be sure to consult the physician who ordered them.   Some, such as Coumadin (Warfarin) are ordered to prevent or treat serious conditions such as deep thrombosis, pumonary embolisms, and other heart problems.  The amount of time that you may need off of the medication may also vary with the medication and the reason for which you were taking it.  If you are taking any of these medications, please make sure you notify your pain physician before you undergo any procedures.         Facet Blocks Patient Information  Description: The facets are joints in the spine between the  vertebrae.  Like any joints in the body, facets can become irritated and painful.  Arthritis can also effect the facets.  By injecting steroids and local anesthetic in and around these joints, we can temporarily block the nerve supply to them.  Steroids act directly on irritated nerves and tissues to reduce selling and inflammation which often leads to decreased pain.  Facet blocks may be done anywhere along the spine from the neck to the low back depending upon the location of your pain.   After numbing the skin with local anesthetic (like Novocaine), a small needle is passed onto the facet joints under x-ray guidance.  You may experience a sensation of pressure while this is being done.  The entire block usually lasts about 15-25 minutes.   Conditions which may be treated by facet blocks:  Low back/buttock pain Neck/shoulder pain Certain types of headaches  Preparation for the injection:  Do not eat any solid food or dairy products within 8 hours of your appointment. You may drink clear liquid up to 3 hours before appointment.  Clear liquids include water, black coffee, juice or soda.  No milk or cream please. You may take your regular medication, including pain medications, with a sip of water before your appointment.  Diabetics should hold regular insulin (if taken separately) and take 1/2 normal NPH dose the morning of the procedure.  Carry some sugar containing  items with you to your appointment. A driver must accompany you and be prepared to drive you home after your procedure. Bring all your current medications with you. An IV may be inserted and sedation may be given at the discretion of the physician. A blood pressure cuff, EKG and other monitors will often be applied during the procedure.  Some patients may need to have extra oxygen administered for a short period. You will be asked to provide medical information, including your allergies and medications, prior to the procedure.  We must know immediately if you are taking blood thinners (like Coumadin/Warfarin) or if you are allergic to IV iodine  contrast (dye).  We must know if you could possible be pregnant.  Possible side-effects:  Bleeding from needle site Infection (rare, may require surgery) Nerve injury (rare) Numbness & tingling (temporary) Difficulty urinating (rare, temporary) Spinal headache (a headache worse with upright posture) Light-headedness (temporary) Pain at injection site (serveral days) Decreased blood pressure (rare, temporary) Weakness in arm/leg (temporary) Pressure sensation in back/neck (temporary)   Call if you experience:  Fever/chills associated with headache or increased back/neck pain Headache worsened by an upright position New onset, weakness or numbness of an extremity below the injection site Hives or difficulty breathing (go to the emergency room) Inflammation or drainage at the injection site(s) Severe back/neck pain greater than usual New symptoms which are concerning to you  Please note:  Although the local anesthetic injected can often make your back or neck feel good for several hours after the injection, the pain will likely return. It takes 3-7 days for steroids to work.  You may not notice any pain relief for at least one week.  If effective, we will often do a series of 2-3 injections spaced 3-6 weeks apart to maximally decrease your  pain.  After the initial series, you may be a candidate for a more permanent nerve block of the facets.  If you have any questions, please call #[phone] [facility] ______________________________________________________________________    Update on Controlled Substance (Opioid) Regulations  To: All patients taking opioid pain medications. (I.e.: hydrocodone , hydromorphone, oxycodone, oxymorphone, morphine, codeine, methadone, tapentadol, tramadol, buprenorphine, fentanyl , etc.)  Re: Review on the state of controlled substance regulations.  Introduction: Rules and regulations associated with all aspects of controlled substances are constantly being modified. Unfortunately we have encountered patients questioning the veracity of the information that we provide them about these changes. This is intended to provide them with appropriate references and a historical review of these changes.  A Brief History: As of September 03, 2016, the [place]  Government declared the opioid epidemic a public health emergency. Prescription drug monitoring programs (PDMPs) and the [facility] All Schedules Prescription Electronic Reporting Act (NASPER). Before 1800, clinicians regarded pain as an existential phenomenon, a consequence of aging. There was no regulation on the use of cocaine and opioids, resulting in widespread marketing and prescribing for many ailments ranging from diarrhea to toothache. The [employer] of [phone], passed in response to the sudden emergence of street heroin abuse as well as iatrogenic morphine dependence, influenced both physician and patient alike to avoid opiates. Patients with unexplained pain in the 1920s were regarded as deluded, malingering, or abusers, and cancer patients through the 1950s were encouraged to wean themselves off opioids until their lives "could be measured in weeks". Alongside this opioid evolution, the American Pain  Society launched their influential "pain as the fifth vital sign" campaign in 1995. Concurrently, pharmaceutical companies introduced new formulations, such as extended release oxycodone (OxyContin). From 1997 to 2002, OxyContin prescriptions increased from 670,000 to 6.2 million. However, concerns soon began to surface regarding overzealous opioid treatment. It must be noted that pharmaceutical companies contributed significantly to the rise of the opioid epidemic, receiving considerable reprimands as a consequence. In 2007, as the opioid epidemic began to inflict profound damage, [employer] pleaded guilty to federal charges related to the misbranding of OxyContin. [employer] agreed to pay a total of $634.5 million to resolve Justice Department investigations, as well as a $19.5 million settlement to [address] and the [address] of [place].  In response to the current epidemic, changes in focus to the development of new abuse deterrent opioid formulations at the [employer] and Drug Administration (FDA) as well as drafting of new public standards for pain treatment were created at [facility] in 2017. In response to the opioid epidemic, FDA public policy changes were announced in February 2016. Among these new positions were a re-examination of the risk-benefit paradigm for opioids with strict emphasis on the large public health ramifications. The various modified opioids released over the past 20 years, such as tamper-resistant preparation, have had differing levels of success, and are collectively referred to as Risk Evaluation and Mitigation Strategies (REMS). There is also a growing focus on preventing opioid use disorder (OUD) and on offering affected individuals accessible and effective treatment. [place]  government policy reflects these changes and both the Affordable Care Act and the Mental Health Parity and Addiction Equity Act were major steps forward in treating opioid addiction. The Affordable Care Act, which was signed  into law in 2010, with major provisions coming into effect by 2014.  In the 1990s, the intensified marketing of newly reformulated prescription opioid medications (e.g., OxyContin) and an influential pain advocacy campaign that encouraged greater pain management led to a precipitous rise in opioid use in the [place] . Research from the Centers for Disease Control and Prevention (CDC) shows that prescription opioid sales in the [place]  quadrupled from 1999 to 2010. At the same time,  opioid misuse and opioid-involved overdose deaths increased (Figure 1). Between 07-19-98 and 07-19-2009, the rate of opioidinvolved overdose deaths in the [place]  doubled from 2.9 to 6.8 deaths per 100,000 people. This initial rise in opioid-related deaths is often referred to as the first wave of the recent opioid crisis.  Between 1998-07-19 and Jul 20, 2019, 565,000 Americans died of opioid-involved overdoses. In turn, federal, state, and local governments responded with various legal and policy efforts to curb opioid misuse and drug-related overdose Deaths.  Recent Congresses have enacted several laws addressing the opioid crisis, such as the Comprehensive Addiction and Recovery Act of 07-20-15 (CARA, P.L. 114-198); the 21st Century Cures Act (P.L. 114-255); the Substance UseDisorder Prevention that Promotes Opioid Recovery and Treatment for Patients and Communities Act (SUPPORT Act, P.L. 812-767-6351); the [employer] (Title LXXII of P.L. A1944156); and the Blocking Deadly Fentanyl  Imports Act (P.L. 117-81, 6610). These laws addressed overprescribing and misuse of opioids, expanded substance use disorder prevention and treatment capacities, bolstered drug diversion capabilities, and enhanced international drug interdiction, counternarcotics cooperation, and sanctions efforts. Congress also directed additional funds to many of these initiatives through appropriations.  Congress provided funding in the  [employer] Act of 07/19/2020 [phone]; P.L. 117-2) for syringe services programs (often known as needle exchange programs) and other harm reduction initiatives. Federal and state harm reduction strategies have frequently involved the distribution of naloxone  (e.g., Narcan )--a medication used to reverse an opioid overdose--and test strips used to detect fentanyl  in drug samples.  The Department of Justice (DOJ) and Department of Homeland Security [facility]) aim to reduce the diversion of prescription opioids and the use, manufacturing, and trafficking of illicit opioids. DOJ--via the Drug Enforcement Administration (DEA)--regulates opioid manufacturers, distributors, and dispensers; it also controls the opioid supply through enforcement of regulatory requirements.  A History of Opiate Laws in the [place]   Prior to Jul 19, 1889, laws concerning opiates were strictly imposed on a local city or state-by-state basis. One of the first was in [place] in 07/19/1874 where it became illegal to smoke opium only in opium dens. It did not ban the sale, import or use otherwise. In the next 25 years different states enacted opium laws ranging from outlawing opium dens altogether to making possession of opium, morphine and heroin without a physician's prescription illegal.  The first Congressional Act took place in 1889/07/19 that levied taxes on morphine and opium. From that time on the [employer] has had a series of laws and acts directly aimed at opiate use, abuse and control. These are outlined below:  1906 - Pure Food and Drug Act Preventing the manufacture, sale, or transportation of adulterated or misbranded or poisonous or deleterious foods, drugs, medicines, and liquors, and for regulating traffic therein, and for other purposes. Punishment included fines and prison time.  1909   - Smoking Opium Exclusion Act Banned the importation, possession and use of smoking opium. Did not regulate opium-based  medications. First Freight forwarder banning the non-medical use of a substance.  1914  - The Margrette Act In summary, The Margrette Act of July 19, 1913 was written more to have all parties involved in importing, exporting, Set designer and distributing opium or cocaine to register with the [employer] and have taxes levied upon them. Exempt from the law were physicians operating "in the course of his professional practice"  07/19/1918 - Supreme Court ratified the [employer] in [place] et al., v. [place]  and [place]  v. [place], then again in [facility] v.  [place] , in 1920, holding that doctors may not prescribe maintenance supplies of narcotics to people addicted to narcotics. However, it does not prohibit doctors from prescribing narcotics to wean a patient off of the drug. It was also the opinion of the court that prescribing narcotics to habitual users was not considered "professional practice" hence it then was considered illegal for doctors to prescribe opioids for the purposes of maintaining an addiction. It can be argued that today's addiction medications are not intended to maintain an addiction but to facilitate addiction remission. In which case, this opinion of the court should not preclude practitioners from prescribing buprenorphine or methadone to patients suffering from an addictive disorder.  1924  - Heroin Act Architectural technologist, importation and possession of heroin illegal - even for medicinal use.  1922 -- Narcotic Drug Import and Export Act Enacted to assure proper control of importation, sale, possession, production and consumption of narcotics.  1927  -- Special educational needs teacher of Prohibition [employer] of Prohibition was responsible for tracking bootleggers and organized Conservation officer, historic buildings. They focused primarily on interstate and international cases and those cases where local law enforcement official would not or could not act.  1932 -- Uniform State Narcotic  Act Encouraged states to pass uniform state laws matching the federal Narcotic Drug Import and Export Act. Suggested prohibiting cannabis use at the state level.  28 -- Food, Drug, and Cosmetic Act The new law brought cosmetics and medical devices under control, and it required that drugs be labeled with adequate directions for safe use. Moreover, it mandated pre-market approval of all new drugs, such that a manufacturer would have to prove to FDA that a drug were safe before it could be sold  1951 -- Boggs Act Imposed maximum criminal penalties for violations of the import/export and internal revenue laws related to drugs and also established mandatory minimum prison sentences.  1956 -- Narcotics Control Act Increased Boggs Act penalties and mandatory prison sentence minimums for violations of existing drug laws.  1965 -- Drug Abuse Control Amendment Enacted to deal with problems caused by abuse of depressants, stimulants and hallucinogens. Restricted research into psychoactive drugs such as LSD by requiring FDA approval.  1970 -- Controlled Substance Act  Controlled Substances Import and Export Act These laws are a consolidation of numerous laws regulating the manufacture and distribution of narcotics, stimulants, depressants, hallucinogens, anabolic steroids, and chemicals used in the illicit production of controlled substances. The CSA places all substances that are regulated under existing federal law into one of five schedules. This placement is based upon the substance's medicinal value, harmfulness, and potential for abuse or addiction. Schedule I is reserved for the most dangerous drugs that have no recognized medical use, while Schedule V is the classification used for the least dangerous drugs. The act also provides a mechanism for substances to be controlled, added to a schedule, decontrolled, removed from control, rescheduled, or transferred from one schedule to another.  55 -  Drug Enforcement Agency By Executive Order, the DEA was formed to take place of the [employer] of Narcotics and Dangerous Drugs.  68 -Narcotic Addict Treatment Act of  1974  - Public Law [phone] Amends the Controlled Substance Act of 1970 to provide for the registration of practitioners conducting narcotic treatment programs. [methadone clinics] It also provides legal definitions for the phrases "maintenance treatment" and "detoxification treatment".  1986 -- Anti-Drug Abuse Act of 1986 Strengthened Federal efforts to encourage foreign cooperation in eradicating illicit drug crops and in halting international  drug traffic, to improve enforcement of Federal drug laws and enhance interdiction of illicit drug shipments, to provide strong Market researcher in establishing effective drug abuse prevention and education programs, to expand Federal support for drug abuse treatment and rehabilitation efforts, and for other purposes. It also re-imposed mandatory sentencing minimums depending on which drug and how much was involved.  1988 -- Anti-Drug Abuse Act of 1988 Established the Office of Materials engineer (ONDCP) in the [employer] of the Economist; authorized funds for [employer], state and local drug enforcement activities, school-based drug prevention efforts, and drug abuse treatment with special emphasis on injecting drug abusers at high risk for AIDS.  2000 -- Federal - The Drug Addiction Treatment Act of 2000 (DATA 2000) It enables qualified physicians to prescribe and/or dispense narcotics for the purpose of treating opioid dependency. For the first time, physicians are able to treat this disease from their private offices or other clinical settings. This presents a very desirable treatment option for those who are unwilling or unable to seek help in drug treatment clinics. Patients can now be treated in the privacy of their doctor's office, as are other people being treated for any other  type of medical condition. One medicine doctors may now prescribe is Buprenorphine. The major downfall of this Act is the limitation of 30 patients per practice - which means that large facilities, no matter how many physicians are there, can only treat 30 patients at a time.  2002-- DEA reschedules buprenorphine from a schedule V drug to a schedule III drug, on August 25, 2001 - the day before the FDA approval of Suboxone and Subutex despite overwhelming objection by the medical community.  2004: June 2004 THE CONFIDENTIALITY OF ALCOHOL AND DRUG ABUSE PATIENT RECORDS REGULATION AND THE HIPAA PRIVACY RULE:  Confidentiality of Alcohol and Drug Dependence Patient Records (summary) Code of Federal Regulations Title 42 Part 2 (42 CFR Part 2)  The confidentiality of alcohol and drug dependence patient records maintained by this practice/program is protected by federal law and regulations. Generally, the practice/program may not say to a person outside the practice/program that a patient attends the practice/program, or disclose any information identifying a patient as being alcohol or drug dependent unless:  The patient consents in writing; The disclosure is allowed by a court order, or The disclosure is made to medical personnel in a medical emergency or to qualified personnel for research,  audit, or practice/program evaluation. Violation of the federal law and regulations by a practice/program is a crime. Suspected violations may be reported to appropriate authorities in accordance with federal regulations. Freight forwarder and regulations do not protect any information about a crime committed by a patient either at the practice/program or against any person who works for the practice/program or about any threat to commit such a crime. Federal laws and regulations do not protect any information about suspected child abuse or neglect from being reported under state law to appropriate state or local  authorities.  sample consent form (MS-WORD)  2005: 06-20-2004 Public law [id number], Amends the Controlled Substances Act to eliminate the 30-patient limit for medical group practices allowed to dispense narcotic drugs in schedules III, IV, or V for maintenance or detoxification treatment (retains the 30-patient limit for an individual physician). This amendment removes the 30-patient limit on group medical practices that treat opioid dependence with buprenorphine. The restriction was part of the original Drug Addiction Treatment Act of 2000 (DATA) that allowed treatment of opioid dependence in a  doctor's office. With this change, every certified doctor may now prescribe buprenorphine up to his or her individual physician limit of 30 patients.  2006: On 11/16/2005 President Levy signed Bill H.R.6344 into law. This allows physicians who have been certified to prescribe certain drugs for the treatment of opioid dependence under DATA2000 to treat up to 100 patients (up from 30) by submitting an intent notification to the Dept of Health and [employer]. This is a major step forward in both fighting the stigma and allowing access to treatment previously not available to some. For more details see 30/100-PATIENT LIMIT  2016: HHS augments regulations concerning the 30/100 patient limit by raising the limit to 275 for qualifying physicians. Link to summary of regulation  2016: Comprehensive Addiction and Recovery Act of 2016 (sec.303) amends the Controlled Substance Act - to allow Nurse Practitioners and Physician Assistants to become eligible to prescribe buprenorphine for the treatment of opioid use disorder. See the entire law for more details.  The roots of the concurrent regulation of certain drugs under two statutory schemes go back to the beginning of this century. In 1906, Congress enacted the Pure Food and Drug Act, establishing one regime of regulation to assure (among other things) that drugs were  not adulterated or misbranded. These regulations were amended several times, recodified in 1938, and expanded on again from the 1940s through the 1990s. Their implementation and enforcement is today assigned to the Food and Drug Administration (FDA) in the Department of Health and Human Services [facility]).  In 1914, Congress adopted the Meno Narcotic Act to stop abuse of addictive drugs. The Margrette Narcotic Act was amended in 1937 to include marijuana. In 1965, amphetamines, barbiturates, and hallucinogens came under regulation, but under the [employer], Drug, and Cosmetic Act. In 1970, these various statutes were consolidated and recodified as the Controlled Substances Act (CSA), which has been amended several times since then. Its implementation and enforcement is today assigned to the Drug Enforcement Administration (DEA) in the Department of Justice.  The first clash occurred after World War I, when so-called morphine clinics existed and physicians prescribed or dispensed morphine to addicts. Some addicts were veterans of the American Civil War, the Spanish-American War, and WWI, who had become addicted during treatment for war wounds, but most of them came from the growing population of nonmedical addicts (Courtwright, 8017). The Narcotics Division of the Prohibition Unit of the Department of the Treasury, which was then responsible for enforcing the [facility] Narcotic Act, concluded that this activity was not the legitimate practice of medicine but simple drug trafficking. The Treasury Department swiftly closed the clinics and made it personally and professionally risky for physicians to maintain a narcotic addict for any reason. In did so, however, only after the American Medical Association had adopted a resolution, in 1920, opposing ambulatory clinics''.  In 1972, the public health establishment, including the Secretary of Health, Education, and Welfare, the Education officer, environmental, the  [employer] of [employer], and the Chemical engineer for Drug Abuse Prevention, was unprepared to allow [employer] of Narcotics and Dangerous Drugs, DEA's predecessor agency, to unilaterally define the parameters of medical practice for the use of methadone in the treatment of heroin addiction. As a consequence, a new set of rules--the third, on top of the FDA and DEA schemes--was added, one that inserted FDA deeply into the practice of medicine, notwithstanding its protestations to the contrary. Congress ratified this joint responsibility of law enforcement and public health  officials for methadone through this third set of rules in 1974 with the passage of the Narcotic Addict Treatment Act (NATA). To examine in detail the evolution of this third set of rules--commonly referred to as the FDA or DHHS methadone regulations--we turn, first, to the period of the mid-1960s.  Increased use of heroin in the post World War II period first became apparent in the early to mid 1950s. During the [employer], a minimum mandatory narcotics law was enacted in 1956, effective July 1957. 1962 [facility] conference on drug abuse, the [employer] on Narcotic and Drug Abuse (the [employer]) of 1963, the Drug Abuse Control Amendments of 1965, the President's Commission on [employer] and Administration of Justice (the [employer]) of [phone], and the Narcotic Addiction Rehabilitation Act of 1966.  The 1965 Drug Abuse Control Amendments brought under strict federal control all nonnarcotic drugs capable of producing serious psychotoxic effects when abused. This act also created the [employer] of Drug Abuse Control within the Department of Health, Education, and Welfare (DHEW) and shifted the basis for [employer] of illegal drugs from tax principles (administered by the Department of Treasury) to the regulation of commerce (administered by the  [employer]).  The 1966 Narcotic Addiction Rehabilitation Act Tour manager) authorized the civil commitment of narcotic addicts, and federal assistance to state and local governments to develop a local system of drug treatment programs. With respect to the latter, the [employer] of Mental Health [facility]) initially proposed the gradual implementation of the state assistance effort, mainly through a common mental health mechanism--inpatient treatment programs. However, because of a perceived pressing need, the courts began to commit addicts to these programs even before they were officially opened or staffed. The NARA legislation imposed the following contract requirements on treatment centers: (1) thrice-a-week counseling sessions; (2) weekly urine tests; (3) restorative dental services; (4) psychological consultations and vocational training; and (5) the treatment modalities of drug-free outpatient, therapeutic community, and methadone maintenance. Reorganization Plan No. 1 of 1968 transferred the primary functions of the [employer] of Narcotics (FBN) from the [employer] to the Department of Justice; it also transferred the [employer] of Drug Abuse Control functions to the Department of Justice. Within the [employer], the [employer] of Narcotics and Dangerous Drugs (BNDD) was created, which became the Drug Enforcement Administration in 1973.   Under the first [place] administration [phone]), federal drug abuse policy developed in a significant way. These developments included a 1969 war on drugs presidential message, resulting legislation in 1970, and a Special Action Office created by executive order in 1971 and authorized in statute in 1972. Brynn, in 1969, to send a message to Congress on drug abuse. Although this was the first time that a U.[place]. president invoked the war on drugs image, it was in retrospect the most balanced approach to the problem of drug abuse that had been advanced. The 1969  message resulted in the submission of legislation to the Congress and the passage, the following year, of the Comprehensive Drug Abuse Prevention and Control Act of 1970 [employer] [phone], September 14, 1969). The act dealt with research, treatment, and prevention of drug abuse and drug dependence, and with drug abuse Charity fundraiser. One major purpose of the 1970 legislation was to reverse some of the strictures of the [employer] of 1914. The 1970 act sought to clarify for the medical profession . . . the extent to which they may safely go in treating narcotic addicts as  patients. Title I, in Section IV, charged the Surveyor, minerals, Education, and Welfare, to determine the appropriate methods of professional practice in the medical treatment of the narcotic addiction of various classes of narcotic addicts. This provision constitutes the initial statutory basis for treatment standards. The law enforcement sections consolidated all prior federal statutes into the Controlled Substances Act and the Controlled Substances Export and Import Act (Titles II and III, respectively, of the Comprehensive Drug Abuse Prevention and Control Act of 1970). Under this legislation, substances were classified under five schedules according to their abuse potential, and psychological and physical effects. Methadone was placed in Schedule II, along with such opiate drugs as morphine, codeine, and hydrocodone .  One of the most important steps taken by President Brynn was to establish in June 1971 the Special Action Office for Drug Abuse Prevention (SAODAP) in the [employer] of the President ([place] [phone], May 05, 1970). In mid-1971, [facility] appointed Dr. Maple Dunnings as SAODAP director. Within a year, the Drug Abuse Prevention Office and Treatment Act of 1972 [employer] [phone], February 07, 1971) gave statutory authority to [facility], but limiting setting, on May 18, 1974, as the limit on its  existence.  The purpose of the 1972 act was to bring the resources of the federal government to bear on drug abuse with the immediate objective of significantly reducing its incidence and developing a comprehensive, coordinated long-term federal strategy to combat drug abuse.  Narcotic Addict Treatment Act [employer]) of 1974 [employer] [phone]), which amended the Controlled Substances Act. This legislation was driven by concern for the diversion of methadone to illicit channels that was occurring in 1972 and 1973, as reflected in the title of the Senate bill adopted on April 26, 1972, the Methadone Diversion Control Act of 1973. (U.S. Senate, 1970a, 8029a).  The 1980 final rule (45 FR 37305, August 08, 1979) reduced the minimum standard for admission from two years of addiction to one year coupled with a clinical determination that the individual was currently physiologically.  The regulations were next revised in 1989, following two proposals to modify them, one in 1983 and one in 1987.  Under President Tanda Corrente, a government-wide effort was made to review all federal government regulations and to eliminate or reduce the burden of these regulations on the private sector, state and [employer], and [employer].   The 1983 recommendations, though not adopted, did initiate another revision of the methadone regulations, which first found expression in a 1987 proposed rule (52 FR 37047, August 20, 1986) and culminated in a final rule (54 FR 8954, January 19, 1988) at the end of the decade. In the 1987 proposed rule, the FDA and NIDA, in an effort to put the best face on the unenthusiastic 1983 response by the provider community to converting the regulations to guidelines, indicated that they had retained the current requirements necessary to achieve the goals of the 1974 NATA, but were proposing to streamline the regulation and to promote more efficient operation of methadone programs.  The 1987 proposed rule, issued by the FDA and NIDA, advanced the following changes in the methadone regulation: that detoxification treatment be divided into short-term (<21 days) and long-term (>21 and <180 days) treatment; that the minimum staffing ratio of one counselor to 50 patients be eliminated; that blood tests be allowed as ways to conduct initial drug screening or to meet the monthly testing requirements for six-day take-home patients; that the 72-hour notification of FDA and the pertinent  state authority for methadone doses greater than 100 mg be eliminated; that special adverse reaction reporting requirements for methadone be eliminated and reliance placed upon general FDA reporting requirements; that a supervising counselor be allowed to conduct the annual review of the patient's treatment plan for certain qualified patients who had been in treatment for 3 years or longer; and that the requirement of an annual report of methadone treatment programs to the FDA be dropped. The FDA and NIDA issued a final rule on January 19, 1988, based on comments on the 1987 proposed (54 FR 8954). Concurrently, FDA and NIDA issued a six-page guidance document, which noted that the regulations, over time, had recommended certain practices that were not actually required. Public Health Service, in Congress, and elsewhere, to reorganize the Alcohol, Drug Abuse, and Mental Health Administration (ADAMHA). These efforts culminated in the [employer] of 1992 [employer] [phone], May 29, 1991), the main purpose of which was to transfer the research portions of the three ADAMHA institutes--NIDA, the [employer] of Alcoholism and Alcohol Abuse, and the [employer] of Mental Health--to the [employer] and to create the Substance Abuse and Museum/gallery exhibitions officer [facility]) as the home for the service functions of these entitles.  Guidelines for Opioid Treatment The  Federal Guidelines for Opioid Treatment Programs - 2015 serve as a guide to accrediting organizations for developing accreditation standards. The guidelines also provide OTPs with information on how programs can achieve and maintain compliance with federal regulations. The 2015 guidelines are an update to the 2007 Guidelines for the Accreditation of Opioid Treatment Programs (PDF  547 KB). The new document reflects the obligation of OTPs to deliver care consistent with the patient-centered, integrated, and recovery-oriented standards of substance use treatment.  DPT oversees the certification of OTPs and provides guidance to nonprofit organizations and state governmental entities that want to become a SAMHSA-approved accrediting body. Learn more about the accreditation and certification of OTPs and [employer] oversight of OTP accreditation bodies.  Model Guidelines for [facility] Boards With input from [facility], the Federation of [employer] in 2013 adopted a revised version of the federation's office-based opioid treatment policies. The Model Policy on DATA 2000 and Treatment of Opioid Addiction in the Medical Office - 2013 (PDF  279 KB) provides model guidelines for use by state medical boards in regulating office-based opioid treatment.  Holiday Guidance for Opioid Treatment Programs (PDF  203 KB) In response to requests for the upcoming federal holidays and ensuing weekends (December 24th, 25th, and 26th and December 31st, Jan 1st, and Jan 2nd), this letter is to provide guidance regarding requests for unsupervised doses of medication for patients for these dates. View a sample [id number] (PDF  194 KB).  Federal regulation of drugs emerged as early as 76, under a law that addressed only imported drugs. In 1905 the [employer] launched a private, voluntary means of controlling a substantial part of the drug marketplace, a system that remained in place for over a half-century.  Drug regulation in FDA has evolved considerably since President Ricardo Para signed the 1906 Pure Food and Drugs Act.  1820 Eleven doctors set up the U.S. Pharmacopeia and record the first list of standard drugs. 1848 Drug Importation Act passed by Congress requires U.S. Customs Service inspection to stop entry of tainted, low quality drugs from overseas. 8116 Dr. Mitchell MICAEL Burrs becomes the chief chemist at the [facility] of [place] adulteration studies.  1905 The American Medical  Association (AMA) begins a voluntary program of drug approval that would last until 1955. In order to advertise in the [facility] and related journals, drug companies must show proof that the drug will treat what they claim. 1906 The original Food and Drug Act is passed by Congress on June 30 and signed by [employer]. The Act outlaws states from buying and selling food, drinks, and drugs that have been mislabeled and tainted. 1911 In U.S. v. Vicci, the [employer] that the [employer] and Drugs Act does not outlaw false medical claims but only false and misleading statements about the ingredients or identity of a drug. 1912 Congress passes the [place] Amendment to overcome the ruling in U.S. v. Vicci. The Act outlaws labeling medicines with fake medical claims that is meant to trick the buyer. 1930 The name of the Food, Drug, and Insecticide Administration is shortened to Food and Drug Administration (FDA) under an Therapist, music. 1933 FDA recommends a total rewrite of the out-of-date 1906 Food and Drugs Act.   1937 Elixir Sulfanilamide, contain the poisonous liquid, diethylene glycol, kills 107 persons, many of whom are children, dramatizing the need to establish drug safety before marketing and to pass the pending food and drug law. 1938 Congress passes [employer], Drug, and Cosmetic (FDC) Act of 1938, which requires that new drugs show safety  before selling. This starts a new system of drug regulation. The Act also requires that safe limits be set for unavoidable poisonous matter and allows for factory inspections. The [employer] is given power to oversee advertising for all FDAregulated products except prescription drugs. FDA states that sulfanilamide and other dangerous drugs must be given under the direction of a medical expert. This begins the requirement for prescription only (nonnarcotic) drugs (see 1951 Stuart-Humphrey amendment). 1941 Nearly 300 deaths and injuries result from the use of sulfathiazole tablets, an antibiotic, tainted with the sedative, phenobarbital. In response, FDA drastically changes manufacturing and quality controls. These changes lead to the development of good manufacturing practices (GMPs). 1948 The [employer] in U.S. v. Floretta that FDA jurisdiction extends to retail stores, thereby allowing FDA to stop illegal sales of drugs by pharmacies including barbiturates and amphetamines. 1950 In [employer]. v. U.S., a U.S. Court of Appeals rules that the directions for use on a drug label must include the drug's purpose. 1951 Congress passes the [place]-Humphrey Amendment, which defines the kinds of drugs that cannot be used safely without medical supervision. The amendment limits sale of these drugs to prescription only by a medical professional. All other drugs are to be available without a prescription. 1952 A nationwide investigation by FDA reveals that chloramphenicol, an antibiotic, caused nearly 180 cases of often deadly blood diseases. Two years later FDA engages the [employer] of Hospital Pharmacists, the American Association of Medical Record Librarians, and later the American Medical Association in a voluntary program of drug reaction reporting. 1953 The [employer] Amendment clarifies previous law and requires FDA to give manufacturers  written reports of conditions seen during inspections and results of factory samples. 1962 Thalidomide, a new sleeping pill, causes severe birth defects of the arms and legs in thousands of babies born in [place]. The U.S. media reports on how Dr. Cathlean Mort, a FDA medical officer, helped prevent approval and marketing of Thalidomide in the [place] . These reports stirred up public support for stronger drug laws. 3 Congress passes the [employer]. For the  first time, these laws require drug makers to prove their drug works before FDA can approve them for sale. The Advisory Committee on Investigational Drugs meets for the first time. This was the first meeting of a committee to advise FDA on product approval and policy on an ongoing basis. 1966 FDA contracts with the [employer] of [employer] to measure the effectiveness of 4,000 marketed drugs approved on the basis of safety alone between [phone] and 21-Jul-1961. The Fair Packaging and Labeling Act requires all consumer products, in interstate commerce, to be honestly and informatively labeled. 07/22/1967 FDA forms the Drug Efficacy Study Implementation (DESI) to carry out recommendations of the [employer] of the effectiveness of drugs first sold between [place] and [id number] FDA requires the first patient package insert, medicines must come with information for the patient about risks and benefits. 1972 Over-the-Counter Drug Review begins to enhance the safety, effectiveness and appropriate labeling of drugs sold without prescription. 1973 The U.S. Supreme Court upholds the [place] drug effectiveness law and approves FDA's action to control entire classes of products. 1982 FDA issues Tamper-resistant Packaging Regulations to prevent poisonings such as deaths from cyanide placed in Tylenol  capsules. Congress passes the [employer] in July 21, 1982,  making it a crime to tamper with packaged consumer products. 07/22/83 Drug Price Advertising account planner Act (Hatch-Waxman Act) increases the availability of less costly generic drugs by allowing FDA to approve applications for generic versions of brand-name drugs without repeating the research that proved the safety and effectiveness of the brand-name drugs. The Act also allowed brand-name companies to apply for up to five years additional patent protection for the new medicines they developed to make up for time lost while their products were going through FDA's approval process. 1989 The FDA issued guidelines asking drug makers to decide if a drug is likely to have usefulness in elderly people and to include elderly people in studies when applicable. 1991 In 07/21/1980, the FDA and the Department of Health and Human Services published a policy on protecting people in research. In 21-Jul-1990, this policy is adopted by more than a dozen federal agencies involved in human subject research and becomes known as the Common Rule. 4 1993 FDA launches MedWatch, a system designed to collect reports from health professionals on problems with drugs and other medical products. FDA issues guidelines for measuring gender differences in responses to medication. Drug companies are encouraged to include patients of both sexes in their research of drugs and to study any gender-specific effects. 1995 FDA declares cigarettes to be drug delivery devices. Limits are issued on marketing and sales to reduce smoking by young people. 1998 FDA introduces the Adverse Event Reporting System (AERS), a computerized database designed to store and study safety reports on already marketed drugs.  The Demographic Rule requires that a marketing application review data on safety and effectiveness by age, gender, and race. The Pediatric Rule requires drug makers of selected new and existing drugs to conduct studies  on drug safety and effectiveness in children. 1999 Creation of the Drug Facts Label for OTC drug products. The law requires all overthe-counter drug labels to have information in a standard format. These drug facts labels are designed to give the user easy-to-find information. 2000 The U. S. [employer], upholds an earlier decision from The [employer] and Drug Administration v. [employer]. et al. and rules 5-4 that FDA does not have authority to regulate tobacco as a  drug. 2002 The Best Pharmaceuticals for Children Act, in exchange for studying the drug in children, the drug maker gets six months of selling their product without competition. 2003 The Pediatric Research Equity Act gives FDA the right to ask drug companies to study the effectiveness of new drugs in children. 2004 FDA advises medical professionals to limit the use of a pain reliever called Cox-2, a nonsteroidal anti-inflammatory drug (NSAIDs). Studies had shown that long-term use raised chances of heart attacks and strokes. The warning is also added to the over-thecounter NSAIDs' Drug Facts label. Medicines used in hospitals must have a bar code to prevent patients from receiving the wrong medicine. 5 2005 The Drug Safety Board is formed, consisting of FDA staff and representatives from the [employer] of [address] and the [employer]. The Board advises the Director, Center for Drug Evaluation and Research, FDA, on drug safety issues and works with the agency in sharing safety information to health professionals and patients.  The [place]  Food and Drug Administration (FDA) was first created to enforce the Pure Food and Drug Act of 1906. In this capacity, the FDA is charged with protecting the health of the [place]  public, to ensure the quality of its food, medicine, and cosmetics. Before this time, the [place]  government had no formal oversight of these products and left issues of quality  and purity to the individual manufactures, or at times, individual states.    Review: [place] Stop ACT. (The Strengthen Opioid Misuse Prevention (STOP) Act of 2017). GENERAL ASSEMBLY OF [place]  SESSION 2017 SESSION LAW 2017-74 HOUSE BILL 243  PMP mandatory The dispenser shall report: (1) The dispenser's DEA number. (2) The name of the patient for whom the controlled substance is being dispensed, and the patient's: a. Full address, including city, state, and zip code, b. Telephone number, and c. Date of birth. (3) The date the prescription was written. (4) The date the prescription was filled. (5) The prescription number. (6) Whether the prescription is new or a refill. (7) Metric quantity of the dispensed drug. (8) Estimated days of supply of dispensed drug, if provided to the dispenser. (9) National Drug Code of dispensed drug. (10) Prescriber's DEA number. (11) Method of payment for the prescription.  No paper prescriptions  Duration of scripts Acute vs Chronic prescribing  2016 CDC Guidelines for prescribing Opioids for Chronic Pain. (Updated in 2022.) Medical Board  Laws:  Prescription Laws Drug laws, rules, and regulations are constantly changing. Any attempt to summarize them would quickly become outdated. Because of that, the Board encourages practitioners who seek guidance on prescribing procedures to refer to the sources listed below in addition to the Board's position statements, rules and Medical Practice Act.  [place]  Board of Pharmacy (NCBOP) (which offers the state's pharmacy laws and rules, and links to the Code of Federal Regulations) [employer] Site: www.ncbop.org  [place]  General Statutes General Web Site: PoliticalPool.cz See: [place]  Food, Drug, and Cosmetic Act: S293155 & 106-134 See: [place]  Pharmacy Practice Act, Article 4A: [id number] See:   Controlled Substances Act, Article 5: 90-86 & 90-113.8 See: Use of  controlled substances to render one mentally incapacitated or physically helpless: [employer] [facility]. Code, Title 21, Food & Drugs www.deadiversion.usdoj.gov Controlled Substances Schedules www.deadiversion.usdoj.gov Drug Enforcement Administration Dive ______________________________________________________________________    Medication Rules  Purpose: To inform patients, and their family members, of our medication rules and regulations.  Applies to: All patients receiving prescriptions from our practice (written or  electronic).  Pharmacy of record: This is the pharmacy where your electronic prescriptions will be sent. Make sure we have the correct one.  Electronic prescriptions: In compliance with the [place]  Strengthen Opioid Misuse Prevention (STOP) Act of 2017 (Session Law 2017-74/H243), effective November 19, 2018, all controlled substances must be electronically prescribed. Written prescriptions, faxing, or calling prescriptions to a pharmacy will no longer be done.  Prescription refills: These will be provided only during in-person appointments. No medications will be renewed without a face-to-face evaluation with your provider. Applies to all prescriptions.  NOTE: The following applies primarily to controlled substances (Opioid* Pain Medications).   Type of encounter (visit): For patients receiving controlled substances, face-to-face visits are required. (Not an option and not up to the patient.)  Patient's Responsibilities: Pain Pills: Bring all pain pills to every appointment (except for procedure appointments). Pill counts are required.  Pill Bottles: Bring pills in original pharmacy bottle. Bring bottle, even if empty. Always bring the bottle of the most recent fill.  Medication refills: You are responsible for knowing and keeping track of what medications you are taking and when is it that you will need a refill. The day before your appointment: write a list  of all prescriptions that need to be refilled. The day of the appointment: give the list to the admitting nurse. Prescriptions will be written only during appointments. No prescriptions will be written on procedure days. If you forget a medication: it will not be Called in, Faxed, or electronically sent. You will need to get another appointment to get these prescribed. No early refills. Do not call asking to have your prescription filled early. Partial  or short prescriptions: Occasionally your pharmacy may not have enough pills to fill your prescription.  NEVER ACCEPT a partial fill or a prescription that is short of the total amount of pills that you were prescribed.  With controlled substances the law allows 72 hours for the pharmacy to complete the prescription.  If the prescription is not completed within 72 hours, the pharmacist will require a new prescription to be written. This means that you will be short on your medicine and we WILL NOT send another prescription to complete your original prescription.  Instead, request the pharmacy to send a carrier to a nearby branch to get enough medication to provide you with your full prescription. Prescription Accuracy: You are responsible for carefully inspecting your prescriptions before leaving our office. Have the discharge nurse carefully go over each prescription with you, before taking them home. Make sure that your name is accurately spelled, that your address is correct. Check the name and dose of your medication to make sure it is accurate. Check the number of pills, and the written instructions to make sure they are clear and accurate. Make sure that you are given enough medication to last until your next medication refill appointment. Taking Medication: Take medication as prescribed. When it comes to controlled substances, taking less pills or less frequently than prescribed is permitted and encouraged. Never take more pills than  instructed. Never take the medication more frequently than prescribed.  Inform other Doctors: Always inform, all of your healthcare providers, of all the medications you take. Pain Medication from other Providers: You are not allowed to accept any additional pain medication from any other Doctor or Healthcare provider. There are two exceptions to this rule. (see below) In the event that you require additional pain medication, you are responsible for notifying us , as stated below. Cough  Medicine: Often these contain an opioid, such as codeine or hydrocodone . Never accept or take cough medicine containing these opioids if you are already taking an opioid* medication. The combination may cause respiratory failure and death. Medication Agreement: You are responsible for carefully reading and following our Medication Agreement. This must be signed before receiving any prescriptions from our practice. Safely store a copy of your signed Agreement. Violations to the Agreement will result in no further prescriptions. (Additional copies of our Medication Agreement are available upon request.) Laws, Rules, & Regulations: All patients are expected to follow all [address] and [employer], Rules,  [place]. Ignorance of the Laws does not constitute a valid excuse.  Illegal drugs and Controlled Substances: The use of illegal substances (including, but not limited to marijuana and its derivatives) and/or the illegal use of any controlled substances is strictly prohibited. Violation of this rule may result in the immediate and permanent discontinuation of any and all prescriptions being written by our practice. The use of any illegal substances is prohibited. Adopted CDC guidelines & recommendations: Target dosing levels will be at or below 60 MME/day. Use of benzodiazepines** is not recommended. Urine Drug testing: Patients taking controlled substances will be required to provide a urine sample upon request. Do  not void before coming to your medication management appointments. Hold emptying your bladder until you are admitted. The admitting nurse will inform you if a sample is required. Our practice reserves the right to call you at any time to provide a sample. Once receiving the call, you have 24 hours to comply with request. Not providing a sample upon request may result in termination of medication therapy.  Exceptions: There are only two exceptions to the rule of not receiving pain medications from other Healthcare Providers. Exception #1 (Emergencies): In the event of an emergency (i.e.: accident requiring emergency care), you are allowed to receive additional pain medication. However, you are responsible for: As soon as you are able, call our office [phone], at any time of the day or night, and leave a message stating your name, the date and nature of the emergency, and the name and dose of the medication prescribed. In the event that your call is answered by a member of our staff, make sure to document and save the date, time, and the name of the person that took your information.  Exception #2 (Planned Surgery): In the event that you are scheduled by another doctor or dentist to have any type of surgery or procedure, you are allowed (for a period no longer than 30 days), to receive additional pain medication, for the acute post-op pain. However, in this case, you are responsible for picking up a copy of our Post-op Pain Management for Surgeons handout, and giving it to your surgeon or dentist. This document is available at our office, and does not require an appointment to obtain it. Simply go to our office during business hours (Monday-Thursday from 8:00 AM to 4:00 PM) (Friday 8:00 AM to 12:00 Noon) or if you have a scheduled appointment with us , prior to your surgery, and ask for it by name. In addition, you are responsible for: calling our office [phone], at any time of the day or night, and  leaving a message stating your name, name of your surgeon, type of surgery, and date of procedure or surgery. Failure to comply with your responsibilities may result in termination of therapy involving the controlled substances.  Consequences:  Non-compliance with the above  rules may result in permanent discontinuation of medication prescription therapy. All patients receiving any type of controlled substance is expected to comply with the above patient responsibilities. Not doing so may result in permanent discontinuation of medication prescription therapy. Medication Agreement Violation. Following the above rules, including your responsibilities will help you in avoiding a Medication Agreement Violation ("Breaking your Pain Medication Contract").  *Opioid medications include: morphine, codeine, oxycodone, oxymorphone, hydrocodone , hydromorphone, meperidine, tramadol, tapentadol, buprenorphine, fentanyl , methadone. **Benzodiazepine medications include: diazepam (Valium), alprazolam (Xanax), clonazepam (Klonopine), lorazepam (Ativan), clorazepate (Tranxene), chlordiazepoxide (Librium), estazolam (Prosom), oxazepam (Serax), temazepam (Restoril), triazolam (Halcion) (Last updated: 09/11/2023) ______________________________________________________________________      ______________________________________________________________________    Blood Thinners  IMPORTANT NOTICE:  If you take any of these, make sure to notify the nursing staff.  Failure to do so may result in serious injury.  Recommended time intervals to stop and restart blood-thinners, before & after invasive procedures  Generic Name Brand Name Pre-procedure: Stop medication for this amount of time before your procedure: Post-procedure: Wait this amount of time after the procedure before restarting your medication:  Abciximab Reopro 15 days 2 hrs  Alteplase Activase 10 days 10 days  Anagrelide Agrylin    Apixaban Eliquis 3 days  6 hrs  Cilostazol Pletal 3 days 5 hrs  Clopidogrel Plavix 7-10 days 2 hrs  Dabigatran Pradaxa 5 days 6 hrs  Dalteparin Fragmin 24 hours 4 hrs  Dipyridamole Aggrenox 11days 2 hrs  Edoxaban Lixiana; Savaysa 3 days 2 hrs  Enoxaparin  Lovenox 24 hours 4 hrs  Eptifibatide Integrillin 8 hours 2 hrs  Fondaparinux  Arixtra 72 hours 12 hrs  Hydroxychloroquine Plaquenil 11 days   Prasugrel Effient 7-10 days 6 hrs  Reteplase Retavase 10 days 10 days  Rivaroxaban Xarelto 3 days 6 hrs  Ticagrelor Brilinta 5-7 days 6 hrs  Ticlopidine Ticlid 10-14 days 2 hrs  Tinzaparin Innohep 24 hours 4 hrs  Tirofiban Aggrastat 8 hours 2 hrs  Warfarin Coumadin 5 days 2 hrs   Other medications with blood-thinning effects  NOTE: Consider stopping these if you have prolonged bleeding despite not taking any of the above blood thinners. Otherwise ask your provider and this will be decided on a case-by-case basis.  Product indications Generic (Brand) names Note  Cholesterol Lipitor Stop 4 days before procedure  Blood thinner (injectable) Heparin (LMW or LMWH Heparin) Stop 24 hours before procedure  Cancer Ibrutinib (Imbruvica) Stop 7 days before procedure  Malaria/Rheumatoid Hydroxychloroquine (Plaquenil) Stop 11 days before procedure  Thrombolytics  10 days before or after procedures   Over-the-counter (OTC) Products with blood-thinning effects  NOTE: Consider stopping these if you have prolonged bleeding despite not taking any of the above blood thinners. Otherwise ask your provider and this will be decided on a case-by-case basis.  Product Common names Stop Time  Aspirin > 325 mg Goody Powders, Excedrin, etc. 11 days  Aspirin <= 81 mg  7 days  Fish oil  4 days  Garlic supplements  7 days  Ginkgo biloba  36 hours  Ginseng  24 hours  NSAIDs Ibuprofen, Naprosyn, etc. 3 days  Vitamin E  4 days   ______________________________________________________________________     rsion Control Program -  www.deadiversion.usdoj.gov 42 CFR  8.12 - Federal opioid treatment standards.   Effective July 15, 2016, prior approval will be required for opioid analgesic doses for [facility]. [place] and [place] [facility]) beneficiaries which:  Exceed 120 mg of morphine equivalents (MME) per day  Are greater than a 14-day  supply of any opioid, or,  Are non-preferred opioid products on the [place] Medicaid Preferred Drug List (PDL)  FEDERAL 42 CFR  8.12 - Federal opioid treatment standards. Title II of the Comprehensive Drug Abuse Prevention and Control Act of 1970, commonly known as the Controlled Substance Act (CSA) Title 21 [place] Controlled Substances Act.   Reference:   ______________________________________________________________________

## 2024-06-29 LAB — TOXASSURE SELECT 13 (MW), URINE

## 2024-06-30 ENCOUNTER — Ambulatory Visit: Admitting: Nurse Practitioner

## 2024-07-14 ENCOUNTER — Encounter: Payer: Self-pay | Admitting: Pain Medicine

## 2024-07-14 ENCOUNTER — Ambulatory Visit
Admission: RE | Admit: 2024-07-14 | Discharge: 2024-07-14 | Disposition: A | Discharge status: Home / Self Care | Source: Ambulatory Visit | Attending: Pain Medicine | Admitting: Pain Medicine

## 2024-07-14 ENCOUNTER — Ambulatory Visit: Admitting: Pain Medicine

## 2024-07-14 ENCOUNTER — Ambulatory Visit (HOSPITAL_BASED_OUTPATIENT_CLINIC_OR_DEPARTMENT_OTHER): Admitting: Pain Medicine

## 2024-07-14 VITALS — BP 139/77 | HR 100 | Temp 97.7°F | Resp 15 | Ht 60.0 in | Wt 186.0 lb

## 2024-07-14 DIAGNOSIS — G8929 Other chronic pain: Secondary | ICD-10-CM | POA: Insufficient documentation

## 2024-07-14 DIAGNOSIS — M47816 Spondylosis without myelopathy or radiculopathy, lumbar region: Secondary | ICD-10-CM

## 2024-07-14 DIAGNOSIS — M4317 Spondylolisthesis, lumbosacral region: Secondary | ICD-10-CM | POA: Diagnosis present

## 2024-07-14 DIAGNOSIS — M5459 Other low back pain: Secondary | ICD-10-CM

## 2024-07-14 DIAGNOSIS — M545 Low back pain, unspecified: Secondary | ICD-10-CM | POA: Insufficient documentation

## 2024-07-14 DIAGNOSIS — M47817 Spondylosis without myelopathy or radiculopathy, lumbosacral region: Secondary | ICD-10-CM | POA: Diagnosis not present

## 2024-07-14 DIAGNOSIS — Z91041 Radiographic dye allergy status: Secondary | ICD-10-CM

## 2024-07-14 MED ORDER — PENTAFLUOROPROP-TETRAFLUOROETH EX AERO
INHALATION_SPRAY | Freq: Once | CUTANEOUS | Status: AC
  Administered 2024-07-14: 30 via TOPICAL

## 2024-07-14 MED ORDER — FENTANYL CITRATE (PF) 100 MCG/2ML IJ SOLN
INTRAMUSCULAR | Status: AC
  Filled 2024-07-14: qty 2

## 2024-07-14 MED ORDER — LIDOCAINE HCL 2 % IJ SOLN
INTRAMUSCULAR | Status: AC
  Filled 2024-07-14: qty 20

## 2024-07-14 MED ORDER — ROPIVACAINE HCL 2 MG/ML IJ SOLN
18.0000 mL | Freq: Once | INTRAMUSCULAR | Status: AC
  Administered 2024-07-14: 18 mL via PERINEURAL

## 2024-07-14 MED ORDER — ROPIVACAINE HCL 2 MG/ML IJ SOLN
INTRAMUSCULAR | Status: AC
  Filled 2024-07-14: qty 20

## 2024-07-14 MED ORDER — MIDAZOLAM HCL 5 MG/5ML IJ SOLN
INTRAMUSCULAR | Status: AC
  Filled 2024-07-14: qty 5

## 2024-07-14 MED ORDER — LIDOCAINE HCL 2 % IJ SOLN
20.0000 mL | Freq: Once | INTRAMUSCULAR | Status: AC
  Administered 2024-07-14: 400 mg

## 2024-07-14 MED ORDER — FENTANYL CITRATE (PF) 100 MCG/2ML IJ SOLN
25.0000 ug | INTRAMUSCULAR | Status: DC | PRN
  Administered 2024-07-14: 50 ug via INTRAVENOUS

## 2024-07-14 MED ORDER — TRIAMCINOLONE ACETONIDE 40 MG/ML IJ SUSP
80.0000 mg | Freq: Once | INTRAMUSCULAR | Status: AC
  Administered 2024-07-14: 80 mg

## 2024-07-14 MED ORDER — MIDAZOLAM HCL 5 MG/5ML IJ SOLN
0.5000 mg | Freq: Once | INTRAMUSCULAR | Status: AC
  Administered 2024-07-14: 2 mg via INTRAVENOUS

## 2024-07-14 MED ORDER — TRIAMCINOLONE ACETONIDE 40 MG/ML IJ SUSP
INTRAMUSCULAR | Status: AC
  Filled 2024-07-14: qty 2

## 2024-07-14 NOTE — Patient Instructions (Signed)

## 2024-07-14 NOTE — Progress Notes (Signed)
 PROVIDER NOTE: Interpretation of information contained herein should be left to medically-trained personnel. Specific patient instructions are provided elsewhere under Patient Instructions section of medical record. This document was created in part using STT-dictation technology, any transcriptional errors that may result from this process are unintentional.  Patient: Mariah Poole Type: Established DOB: 12/26/1945 MRN: [id number] PCP: Buren Rock HERO, MD  Service: Procedure DOS: 07/14/2024 Setting: Ambulatory Location: Ambulatory outpatient facility Delivery: Face-to-face Provider: Eric DELENA Como, MD Specialty: Interventional Pain Management Specialty designation: 09 Location: Outpatient facility Ref. Prov.: Patel, Seema K, NP       Interventional Therapy   Type: Lumbar Facet, Medial Branch Block(s)   #6  Laterality: Bilateral  Level: L2, L3, L4, L5, and S1 Medial Branch/Dorsal Rami Level(s). Injecting these levels blocks the L3-4, L4-5, and L5-S1 lumbar facet joints. Imaging: Fluoroscopic guidance Spinal ([id number]) Anesthesia: Local anesthesia (1-2% Lidocaine ) Anxiolysis: IV Versed  2.0 mg Sedation: Minimal Sedation Fentanyl  1 mL (50 mcg) DOS: 07/14/2024 Performed by: Eric DELENA Como, MD  Primary Purpose: Diagnostic/Therapeutic Indications: Low back pain severe enough to impact quality of life or function. 1. Chronic low back pain (1ry area of Pain) (Bilateral) (R>L) w/o sciatica   2. 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 Lumbar facet hypertrophy   4. Lumbar facet joint pain   5. Lumbar facet syndrome (Bilateral) (R>L)   6. Spondylosis without myelopathy or radiculopathy, lumbosacral region   7. Low back pain of over 3 months duration   8. Intermittent low back pain   9. History of contrast media allergy. (IVP dye)    NAS-11 Pain score:   Pre-procedure: 5 /10   Post-procedure: 0-No pain/10     Position / Prep / Materials:  Position: Prone   Prep solution: ChloraPrep (2% chlorhexidine gluconate and 70% isopropyl alcohol) Area Prepped: Posterolateral Lumbosacral Spine (Wide prep: From the lower border of the scapula down to the end of the tailbone and from flank to flank.)  Materials:  Tray: Block Needle(s):  Type: Spinal  Gauge (G): 22  Length: 5-in Qty: 4     H&P (Pre-op Assessment):  Mariah Poole is a 78 y.o. (year old), female patient, seen today for interventional treatment. She  has a past surgical history that includes Abdominal hysterectomy; Cataract extraction w/ intraocular lens implant (Right); Gastric bypass; Hernia repair; Cataract extraction w/PHACO (Left, 07/19/2015); Cholecystectomy; and Hernia repair. Mariah Poole has a current medication list which includes the following prescription(s): albuterol, atenolol, baclofen, vitamin d3, duloxetine, famotidine , ferosul, fluticasone  furoate-vilanterol, furosemide, lidocaine , hydrocodone -acetaminophen , [START ON 08/02/2024] hydrocodone -acetaminophen , [START ON 09/01/2024] hydrocodone -acetaminophen , losartan, magnesium  oxide, meloxicam , montelukast, ozempic (0.25 or 0.5 mg/dose), pantoprazole , potassium chloride sa, pravastatin, spironolactone, symbicort , turmeric curcumin, and ascorbic acid, and the following Facility-Administered Medications: fentanyl . Her primarily concern today is the Back Pain (lower)  Initial Vital Signs:  Pulse/HCG Rate: 100ECG Heart Rate: 90 (nsr) Temp: (!) 96.7 F (35.9 C) Resp: 18 BP: 127/77 SpO2: 98 %  BMI: Estimated body mass index is 36.33 kg/m as calculated from the following:   Height as of this encounter: 5' (1.524 m).   Weight as of this encounter: 186 lb (84.4 kg).  Risk Assessment: Allergies: Reviewed. She is allergic to contrast media [iodinated contrast media], ioxaglate, and shellfish allergy.  Allergy Precautions: None required Coagulopathies: Reviewed. None identified.  Blood-thinner therapy: None at this time Active  Infection(s): Reviewed. None identified. Mariah Poole is afebrile  Site Confirmation: Mariah Poole was asked to confirm the procedure and laterality before marking the  site Procedure checklist: Completed Consent: Before the procedure and under the influence of no sedative(s), amnesic(s), or anxiolytics, the patient was informed of the treatment options, risks and possible complications. To fulfill our ethical and legal obligations, as recommended by the American Medical Association's Code of Ethics, I have informed the patient of my clinical impression; the nature and purpose of the treatment or procedure; the risks, benefits, and possible complications of the intervention; the alternatives, including doing nothing; the risk(s) and benefit(s) of the alternative treatment(s) or procedure(s); and the risk(s) and benefit(s) of doing nothing. The patient was provided information about the general risks and possible complications associated with the procedure. These may include, but are not limited to: failure to achieve desired goals, infection, bleeding, organ or nerve damage, allergic reactions, paralysis, and death. In addition, the patient was informed of those risks and complications associated to Spine-related procedures, such as failure to decrease pain; infection (i.e.: Meningitis, epidural or intraspinal abscess); bleeding (i.e.: epidural hematoma, subarachnoid hemorrhage, or any other type of intraspinal or peri-dural bleeding); organ or nerve damage (i.e.: Any type of peripheral nerve, nerve root, or spinal cord injury) with subsequent damage to sensory, motor, and/or autonomic systems, resulting in permanent pain, numbness, and/or weakness of one or several areas of the body; allergic reactions; (i.e.: anaphylactic reaction); and/or death. Furthermore, the patient was informed of those risks and complications associated with the medications. These include, but are not limited to: allergic reactions  (i.e.: anaphylactic or anaphylactoid reaction(s)); adrenal axis suppression; blood sugar elevation that in diabetics may result in ketoacidosis or comma; water retention that in patients with history of congestive heart failure may result in shortness of breath, pulmonary edema, and decompensation with resultant heart failure; weight gain; swelling or edema; medication-induced neural toxicity; particulate matter embolism and blood vessel occlusion with resultant organ, and/or nervous system infarction; and/or aseptic necrosis of one or more joints. Finally, the patient was informed that Medicine is not an exact science; therefore, there is also the possibility of unforeseen or unpredictable risks and/or possible complications that may result in a catastrophic outcome. The patient indicated having understood very clearly. We have given the patient no guarantees and we have made no promises. Enough time was given to the patient to ask questions, all of which were answered to the patient's satisfaction. Mariah Poole has indicated that she wanted to continue with the procedure. Attestation: I, the ordering provider, attest that I have discussed with the patient the benefits, risks, side-effects, alternatives, likelihood of achieving goals, and potential problems during recovery for the procedure that I have provided informed consent. Date  Time: 07/14/2024  8:42 AM  Pre-Procedure Preparation:  Monitoring: As per clinic protocol. Respiration, ETCO2, SpO2, BP, heart rate and rhythm monitor placed and checked for adequate function Safety Precautions: Patient was assessed for positional comfort and pressure points before starting the procedure. Time-out: I initiated and conducted the Time-out before starting the procedure, as per protocol. The patient was asked to participate by confirming the accuracy of the Time Out information. Verification of the correct person, site, and procedure were performed and  confirmed by me, the nursing staff, and the patient. Time-out conducted as per Joint Commission's Universal Protocol (UP.01.01.01). Time: 1024 Start Time: 1024 hrs.  Description of Procedure:          Laterality: (see above) Targeted Levels: (see above)  Safety Precautions: Aspiration looking for blood return was conducted prior to all injections. At no point did we inject any substances, as  a needle was being advanced. Before injecting, the patient was told to immediately notify me if she was experiencing any new onset of ringing in the ears, or metallic taste in the mouth. No attempts were made at seeking any paresthesias. Safe injection practices and needle disposal techniques used. Medications properly checked for expiration dates. SDV (single dose vial) medications used. After the completion of the procedure, all disposable equipment used was discarded in the proper designated medical waste containers. Local Anesthesia: Protocol guidelines were followed. The patient was positioned over the fluoroscopy table. The area was prepped in the usual manner. The time-out was completed. The target area was identified using fluoroscopy. A 12-in long, straight, sterile hemostat was used with fluoroscopic guidance to locate the targets for each level blocked. Once located, the skin was marked with an approved surgical skin marker. Once all sites were marked, the skin (epidermis, dermis, and hypodermis), as well as deeper tissues (fat, connective tissue and muscle) were infiltrated with a small amount of a short-acting local anesthetic, loaded on a 10cc syringe with a 25G, 1.5-in  Needle. An appropriate amount of time was allowed for local anesthetics to take effect before proceeding to the next step. Local Anesthetic: Lidocaine  2.0% The unused portion of the local anesthetic was discarded in the proper designated containers. Technical description of process:  Medial Branch  Dorsal Rami Nerve Block (MBB):   Neuroanatomy note: Each lumbar facet joint receives dual innervation from medial branches arising from the posterior primary rami at the same level and one level above. The target for each lumbar medial branch is the junction of the ipsilateral superior articular and transverse process of the lower vertebral body. (i.e.: The L4-L5 facet joint is innervated by the L4 medial branch [located at L5] and the L3 medial branch [located at L4]. Blocking the L4 Medial Branch is therefore achieved by injecting at the junction of the ipsilateral superior articular and transverse process of the lower vertebral body [L5].).  Exception: The exception to the above rule is the L5-S1 facet joint which has triple innervation requiring the L4 medial branch, as well as the L5 and the S1 Dorsal Rami(s) to be blocked to fully denervate the joint.  Under fluoroscopic guidance, a needle was inserted until contact was made with os over the target area. After negative aspiration, 0.5 mL of the nerve block solution was injected without difficulty or complication. Paresthesia were avoided during injection. The needle(s) were removed intact and without complication.  Once the entire procedure was completed, the treated area was cleaned, making sure to leave some of the prepping solution back to take advantage of its long term bactericidal properties.         Illustration of the posterior view of the lumbar spine and the posterior neural structures. Laminae of L2 through S1 are labeled. DPRL5, dorsal primary ramus of L5; DPRS1, dorsal primary ramus of S1; DPR3, dorsal primary ramus of L3; FJ, facet (zygapophyseal) joint L3-L4; I, inferior articular process of L4; LB1, lateral branch of dorsal primary ramus of L1; IAB, inferior articular branches from L3 medial branch (supplies L4-L5 facet joint); IBP, intermediate branch plexus; MB3, medial branch of dorsal primary ramus of L3; NR3, third lumbar nerve root; S, superior articular  process of L5; SAB, superior articular branches from L4 (supplies L4-5 facet joint also); TP3, transverse process of L3.   Facet Joint Innervation (* possible contribution)  L1-2 T12, L1 (L2*)  Medial Branch  L2-3 L1, L2 (L3*)  L3-4 L2, L3 (L4*)                     L4-5 L3, L4 (L5*)                     L5-S1 L4, L5, S1                        Vitals:   07/14/24 1036 07/14/24 1040 07/14/24 1050 07/14/24 1100  BP:  (!) 142/89 (!) 145/84 139/77  Pulse:      Resp: 15 11 13 15   Temp:  97.7 F (36.5 C)  97.7 F (36.5 C)  TempSrc:      SpO2: 100% 96% 97% 97%  Weight:      Height:         End Time: 1035 hrs.  Imaging Guidance (Spinal):         Type of Imaging Technique: Fluoroscopy Guidance (Spinal) Indication(s): Fluoroscopy guidance for needle placement to enhance accuracy in procedures requiring precise needle localization for targeted delivery of medication in or near specific anatomical locations not easily accessible without such real-time imaging assistance. Exposure Time: Please see nurses notes. Contrast: None used. Fluoroscopic Guidance: I was personally present during the use of fluoroscopy. Tunnel Vision Technique used to obtain the best possible view of the target area. Parallax error corrected before commencing the procedure. Direction-depth-direction technique used to introduce the needle under continuous pulsed fluoroscopy. Once target was reached, antero-posterior, oblique, and lateral fluoroscopic projection used confirm needle placement in all planes. Images permanently stored in EMR. Interpretation: No contrast injected. I personally interpreted the imaging intraoperatively. Adequate needle placement confirmed in multiple planes. Permanent images saved into the patient's record.  Post-operative Assessment:  Post-procedure Vital Signs:  Pulse/HCG Rate: 10082 Temp: 97.7 F (36.5 C) Resp: 15 BP: 139/77 SpO2: 97 %  EBL:  None  Complications: No immediate post-treatment complications observed by team, or reported by patient.  Note: The patient tolerated the entire procedure well. A repeat set of vitals were taken after the procedure and the patient was kept under observation following institutional policy, for this type of procedure. Post-procedural neurological assessment was performed, showing return to baseline, prior to discharge. The patient was provided with post-procedure discharge instructions, including a section on how to identify potential problems. Should any problems arise concerning this procedure, the patient was given instructions to immediately contact us , at any time, without hesitation. In any case, we plan to contact the patient by telephone for a follow-up status report regarding this interventional procedure.  Comments:  No additional relevant information.  Plan of Care (POC)  Orders:  Orders Placed This Encounter  Procedures   LUMBAR FACET(MEDIAL BRANCH NERVE BLOCK) MBNB    Scheduling Instructions:     Procedure: Lumbar facet block (AKA.: Lumbosacral medial branch nerve block)     Side: Bilateral     Level: L3-4, L4-5, L5-S1, and TBD Facets (L2, L3, L4, L5, S1, and TBD Medial Branch Nerves)     Sedation: Patient's choice.     Date: 07/14/2024    Where will this procedure be performed?:   [facility]   [facility] C-ARM 1-60 MIN NO REPORT    Intraoperative interpretation by procedural physician at [facility].    Standing Status:   Standing    Number of Occurrences:   1    Reason for exam::   Assistance in needle guidance and placement  for procedures requiring needle placement in or near specific anatomical locations not easily accessible without such assistance.   Informed Consent Details: Physician/Practitioner Attestation; Transcribe to consent form and obtain patient signature    Nursing Order: Transcribe to consent form and obtain patient signature. Note:  Always confirm laterality of pain with Ms. Dragovich, before procedure.    Physician/Practitioner attestation of informed consent for procedure/surgical case:   I, the physician/practitioner, attest that I have discussed with the patient the benefits, risks, side effects, alternatives, likelihood of achieving goals and potential problems during recovery for the procedure that I have provided informed consent.    Procedure:   Lumbar Facet Block  under fluoroscopic guidance    Physician/Practitioner performing the procedure:   Cyana Shook A. Tanya MD    Indication/Reason:   Low Back Pain, with our without leg pain, due to Facet Joint Arthralgia (Joint Pain) Spondylosis (Arthritis of the Spine), without myelopathy or radiculopathy (Nerve Damage).   Provide equipment / supplies at bedside    Procedure tray: Block Tray (Disposable  single use) Skin infiltration needle: Regular 1.5-in, 25-G, (x1) Block Needle type: Spinal Amount/quantity: 4 Size: Medium (5-inch) Gauge: 22G    Standing Status:   Standing    Number of Occurrences:   1    Specify:   Block Tray   Saline lock IV    Have LR 254 [id number] 0330 mL available and administer at 125 mL/hr if patient becomes hypotensive.    Standing Status:   Standing    Number of Occurrences:   1   Miscellanous precautions    Standing Status:   Standing    Number of Occurrences:   1     Hydrocodone /APAP 10/325, 1 tab PO q 8 hrs PRN (30 mg/day of hydrocodone ) (975 mg/day of acetaminophen ) MME/day: 30 mg/day.    Medications ordered for procedure: Meds ordered this encounter  Medications   lidocaine  (XYLOCAINE ) 2 % (with pres) injection 400 mg   pentafluoroprop-tetrafluoroeth (GEBAUERS) aerosol   midazolam  (VERSED ) 5 MG/5ML injection 0.5-2 mg    Make sure Flumazenil is available in the pyxis when using this medication. If oversedation occurs, administer 0.2 mg IV over 15 sec. If after 45 sec no response, administer 0.2 mg again over 1 min; may repeat at 1 min  intervals; not to exceed 4 doses (1 mg)   fentaNYL  (SUBLIMAZE ) injection 25-50 mcg    Make sure Narcan  is available in the pyxis when using this medication. In the event of respiratory depression (RR< 8/min): Titrate NARCAN  (naloxone ) in increments of 0.1 to 0.2 mg IV at 2-3 minute intervals, until desired degree of reversal.   ropivacaine  (PF) 2 mg/mL (0.2%) (NAROPIN ) injection 18 mL   triamcinolone  acetonide (KENALOG -40) injection 80 mg   Medications administered: We administered lidocaine , pentafluoroprop-tetrafluoroeth, midazolam , fentaNYL , ropivacaine  (PF) 2 mg/mL (0.2%), and triamcinolone  acetonide.  See the medical record for exact dosing, route, and time of administration.    Interventional Therapies  Risk  Complexity Considerations:   Allergy: CONTRAST  COPD  BA  CKD  SOBOE  GERD  Jehovah's witness  Hx. intestinal obstruction  CHF  HTN  DM  MO (BMI>30)  No RFA until patient brings BMI down to less than 30 kg/m   Planned  Pending:   Therapeutic bilateral lumbar facet MBB #5    Under consideration:   Therapeutic bilateral lumbar facet MBB #4  Possible bilateral lumbar facet RFA #1 (on hold until BMI is brought down to 30 kg/m)   Completed:  Diagnostic left IA shoulder and AC joint inj. x1 (10/08/2023) (100/100/60/60)  Diagnostic bilateral lumbar facet MBB x3 (01/18/2022) (5/10 to 0/10) (100/100/75/75-100)    Therapeutic  Palliative (PRN) options:   Therapeutic/palliative bilateral lumbar facet MBB   Pharmacotherapy  Nonopioids transferred 11/09/2020: Magnesium , Lyrica , and Mobic .       Follow-up plan:   Return in about 2 weeks (around 07/28/2024) for Eval-day (M,W), (Face2F), (PPE).     Recent Visits Date Type Provider Dept  06/25/24 Office Visit Patel, Seema K, NP [facility]  Showing recent visits within past 90 days and meeting all other requirements Today's Visits Date Type Provider Dept  07/14/24 Procedure visit Tanya Glisson, MD  [facility]  Showing today's visits and meeting all other requirements Future Appointments Date Type Provider Dept  07/29/24 Appointment Tanya Glisson, MD [facility]  09/24/24 Appointment Patel, Seema K, NP [facility]  Showing future appointments within next 90 days and meeting all other requirements   Disposition: Discharge home  Discharge (Date  Time): 07/14/2024; 1107 hrs.   Primary Care Physician: Buren Rock HERO, MD Location: [facility] Note by: Glisson DELENA Tanya, MD (TTS technology used. I apologize for any typographical errors that were not detected and corrected.) Date: 07/14/2024; Time: 11:10 AM  Disclaimer:  Medicine is not an Visual merchandiser. The only guarantee in medicine is that nothing is guaranteed. It is important to note that the decision to proceed with this intervention was based on the information collected from the patient. The Data and conclusions were drawn from the patient's questionnaire, the interview, and the physical examination. Because the information was provided in large part by the patient, it cannot be guaranteed that it has not been purposely or unconsciously manipulated. Every effort has been made to obtain as much relevant data as possible for this evaluation. It is important to note that the conclusions that lead to this procedure are derived in large part from the available data. Always take into account that the treatment will also be dependent on availability of resources and existing treatment guidelines, considered by other Pain Management Practitioners as being common knowledge and practice, at the time of the intervention. For Medico-Legal purposes, it is also important to point out that variation in procedural techniques and pharmacological choices are the acceptable norm. The indications, contraindications, technique, and results of the above procedure should only be interpreted and  judged by a Board-Certified Interventional Pain Specialist with extensive familiarity and expertise in the same exact procedure and technique.

## 2024-07-15 ENCOUNTER — Telehealth: Payer: Self-pay

## 2024-07-15 ENCOUNTER — Telehealth: Payer: Self-pay | Admitting: Pain Medicine

## 2024-07-15 NOTE — Telephone Encounter (Signed)
 Post procedure follow up.  Patient states she is doing ok.

## 2024-07-15 NOTE — Telephone Encounter (Signed)
 Patient will like for a nurse to give her a call. PT has some questions to ask. TY

## 2024-07-28 NOTE — Progress Notes (Unsigned)
 PROVIDER NOTE: Interpretation of information contained herein should be left to medically-trained personnel. Specific patient instructions are provided elsewhere under Patient Instructions section of medical record. This document was created in part using AI and STT-dictation technology, any transcriptional errors that may result from this process are unintentional.  Patient: Mariah Poole  Service: E/M   PCP: Buren Rock HERO, MD  DOB: September 15, 1946  DOS: 07/29/2024  Provider: Eric DELENA Como, MD  MRN: [id number]  Delivery: Virtual Visit  Specialty: Interventional Pain Management  Type: Established Patient  Setting: Ambulatory outpatient facility  Specialty designation: 09  Referring Prov.: Buren Rock HERO, MD  Location: Remote location       Virtual Encounter - Pain Management PROVIDER NOTE: Information contained herein reflects review and annotations entered in association with encounter. Interpretation of such information and data should be left to medically-trained personnel. Information provided to patient can be located elsewhere in the medical record under Patient Instructions. Document created using STT-dictation technology, any transcriptional errors that may result from process are unintentional.    Contact & Pharmacy Preferred: [phone] Home: [phone] (home) Mobile: [phone] (mobile) E-mail: [email or web address]  [facility] - [place], [place] - [address] [place] [address] Phone: [phone] Fax: [phone]   Pre-screening  Ms. Touch offered in-person vs virtual encounter. She indicated preferring virtual for this encounter.   Reason COVID-19*  Social distancing based on CDC and AMA recommendations.   I contacted [name] on 07/29/2024 via telephone.      I clearly identified myself as Eric DELENA Como, MD. I verified that I was speaking with the correct person using two identifiers (Name: Mariah Poole, and  date of birth: 05-Mar-1946).  Consent I sought verbal advanced consent from Mariah ORN Cory for virtual visit interactions. I informed [name] of possible security and privacy concerns, risks, and limitations associated with providing not-in-person medical evaluation and management services. I also informed [name] of the availability of in-person appointments. Finally, I informed her that there would be a charge for the virtual visit and that she could be  personally, fully or partially, financially responsible for it. [name] expressed understanding and agreed to proceed.   Historic Elements   [name] is a 78 y.o. year old, female patient evaluated today after our last contact on 07/15/2024. [name]  has a past medical history of Anemia, Arthritis, COPD (chronic obstructive pulmonary disease) (HCC), Depression, Diabetes mellitus without complication (HCC), GERD (gastroesophageal reflux disease), Hiatal hernia, History of contrast media allergy. (IVP dye) (09/01/2015), History of hiatal hernia, Hypercholesteremia, Hypertension, Morbid obesity (HCC), Scoliosis, and Shortness of breath dyspnea. She also  has a past surgical history that includes Abdominal hysterectomy; Cataract extraction w/ intraocular lens implant (Right); Gastric bypass; Hernia repair; Cataract extraction w/PHACO (Left, 07/19/2015); Cholecystectomy; and Hernia repair. [name] has a current medication list which includes the following prescription(s): albuterol, atenolol, baclofen, vitamin d3, duloxetine, famotidine , ferosul, fluticasone  furoate-vilanterol, furosemide, lidocaine , hydrocodone -acetaminophen , [START ON 08/02/2024] hydrocodone -acetaminophen , [START ON 09/01/2024] hydrocodone -acetaminophen , losartan, magnesium  oxide, meloxicam , montelukast, ozempic (0.25 or 0.5 mg/dose), pantoprazole , potassium chloride sa, pravastatin, spironolactone, symbicort , turmeric curcumin, and ascorbic acid. She  reports that  she has never smoked. She has never used smokeless tobacco. She reports current drug use. She reports that she does not drink alcohol. [name] is allergic to contrast media [iodinated contrast media], ioxaglate, and shellfish allergy.  BMI: Estimated body mass index is 36.33 kg/m as calculated from the following:  Height as of 07/14/24: 5' (1.524 m).   Weight as of 07/14/24: 186 lb (84.4 kg). Last encounter: 03/02/2024. Last procedure: 07/14/2024.  HPI  Today, she is being contacted for a post-procedure assessment.  The patient was scheduled to come in for a face-to-face follow-up evaluation, but she was feeling sick and called to change the appointment to a virtual visit.  It would appear that she has either the flu or she might have contracted COVID.  I have recommended that she test herself and contact her primary care physician.  She indicated that she would.  Otherwise she refers having done great with the palliative bilateral lumbar facet block and she was very thankful for our treatment.  The patient has been instructed to give us  a call when needed.  She understood and agreed.  Post-Procedure Evaluation   Type: Lumbar Facet, Medial Branch Block(s)   #6  Laterality: Bilateral  Level: L2, L3, L4, L5, and S1 Medial Branch/Dorsal Rami Level(s). Injecting these levels blocks the L3-4, L4-5, and L5-S1 lumbar facet joints. Imaging: Fluoroscopic guidance Spinal ([id number]) Anesthesia: Local anesthesia (1-2% Lidocaine ) Anxiolysis: IV Versed  2.0 mg Sedation: Minimal Sedation Fentanyl  1 mL (50 mcg) DOS: 07/14/2024 Performed by: Eric DELENA Como, MD  Primary Purpose: Diagnostic/Therapeutic Indications: Low back pain severe enough to impact quality of life or function. 1. Chronic low back pain (1ry area of Pain) (Bilateral) (R>L) w/o sciatica   2. 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 Lumbar facet hypertrophy   4. Lumbar facet joint pain   5. Lumbar facet syndrome  (Bilateral) (R>L)   6. Spondylosis without myelopathy or radiculopathy, lumbosacral region   7. Low back pain of over 3 months duration   8. Intermittent low back pain   9. History of contrast media allergy. (IVP dye)    NAS-11 Pain score:   Pre-procedure: 5 /10   Post-procedure: 0-No pain/10     Effectiveness:  Initial hour after procedure: 100 %. Subsequent 4-6 hours post-procedure: 100 %. Analgesia past initial 6 hours: 75 % (ongoing). Ongoing improvement:  Analgesic: The patient indicates having attained 100% relief of the pain for the duration of local anesthetic followed by an ongoing 75% improvement of her low back pain. Function: Ms. Luddy reports improvement in function ROM: Ms. Lagerquist reports improvement in ROM  Pharmacotherapy Assessment  Hydrocodone /APAP 10/325, 1 tab PO q 8 hrs PRN (30 mg/day of hydrocodone ) (975 mg/day of acetaminophen ) MME/day: 30 mg/day.   Monitoring: [place] PMP: PDMP reviewed during this encounter.       Pharmacotherapy: No side-effects or adverse reactions reported. Compliance: No problems identified. Effectiveness: Clinically acceptable. Plan: Refer to POC.  UDS:  Summary  Date Value Ref Range Status  06/25/2024 FINAL  Final    Comment:    ==================================================================== [employer] Select 13 (MW) ==================================================================== Test                             Result       Flag       Units  Drug Present and Declared for Prescription Verification   Hydrocodone                     1912         EXPECTED   ng/mg creat   Hydromorphone                  146  EXPECTED   ng/mg creat   Dihydrocodeine                 436          EXPECTED   ng/mg creat   Norhydrocodone                 2613         EXPECTED   ng/mg creat    Sources of hydrocodone  include scheduled prescription medications.    Hydromorphone, dihydrocodeine and norhydrocodone are expected     metabolites of hydrocodone . Hydromorphone and dihydrocodeine are    also available as scheduled prescription medications.  ==================================================================== Test                      Result    Flag   Units      Ref Range   Creatinine              142              mg/dL      >=79 ==================================================================== Declared Medications:  The flagging and interpretation on this report are based on the  following declared medications.  Unexpected results may arise from  inaccuracies in the declared medications.   **Note: The testing scope of this panel includes these medications:   Hydrocodone  (Norco)   **Note: The testing scope of this panel does not include the  following reported medications:   Acetaminophen  (Norco)  Albuterol (Ventolin HFA)  Atenolol (Tenormin)  Baclofen (Lioresal)  Budesonide (Symbicort )  Duloxetine (Cymbalta)  Famotidine  (Pepcid )  Fluticasone  (Breo)  Formoterol (Symbicort )  Furosemide (Lasix)  Iron  Lidocaine   Losartan (Cozaar)  Magnesium  (Mag-Ox)  Meloxicam  (Mobic )  Montelukast (Singulair)  Naloxone  (Narcan )  Pantoprazole  (Protonix )  Potassium (Klor-Con)  Pravastatin (Pravachol)  Semaglutide (Ozempic)  Spironolactone (Aldactone)  Turmeric  Vilanterol (Breo)  Vitamin C  Vitamin D3 ==================================================================== For clinical consultation, please call [phone]. ====================================================================    No results found for: CBDTHCR, D8THCCBX, D9THCCBX  Laboratory Chemistry Profile   Renal Lab Results  Component Value Date   BUN 14 09/14/2021   CREATININE 1.02 (H) 09/14/2021   BCR 21 06/17/2019   GFRAA 53 (L) 06/17/2019   GFRNONAA 57 (L) 09/14/2021    Hepatic Lab Results  Component Value Date   AST 19 09/14/2021   ALT 11 09/14/2021   ALBUMIN  3.8 09/14/2021   ALKPHOS 66 09/14/2021    LIPASE 19 10/27/2015    Electrolytes Lab Results  Component Value Date   NA 140 09/14/2021   K 4.3 09/14/2021   CL 102 09/14/2021   CALCIUM 9.3 09/14/2021   MG 1.9 06/17/2019    Bone Lab Results  Component Value Date   25OHVITD1 45 06/17/2019   25OHVITD2 <1.0 06/17/2019   25OHVITD3 45 06/17/2019    Inflammation (CRP: Acute Phase) (ESR: Chronic Phase) Lab Results  Component Value Date   CRP 7 06/17/2019   ESRSEDRATE 49 (H) 06/17/2019   LATICACIDVEN 4.0 (H) 11/23/2009         Note: Above Lab results reviewed.  Imaging  DG PAIN CLINIC C-ARM 1-60 MIN NO REPORT Fluoro was used, but no Radiologist interpretation will be provided.  Please refer to NOTES tab for provider progress note.  Assessment  The primary encounter diagnosis was Chronic low back pain (1ry area of Pain) (Bilateral) (R>L) w/o sciatica. Diagnoses of Lumbar facet joint pain, Lumbar facet syndrome (Bilateral) (R>L), Low back pain of over 3 months duration,  Intermittent low back pain, and Postop check were also pertinent to this visit.  Plan of Care  Problem-specific:  No problem-specific Assessment & Plan notes found for this encounter.  Ms. MELANNI BENWAY has a current medication list which includes the following long-term medication(s): albuterol, atenolol, duloxetine, famotidine , ferosul, furosemide, hydrocodone -acetaminophen , [START ON 08/02/2024] hydrocodone -acetaminophen , [START ON 09/01/2024] hydrocodone -acetaminophen , montelukast, pantoprazole , potassium chloride sa, pravastatin, spironolactone, and symbicort .  Pharmacotherapy (Medications Ordered): No orders of the defined types were placed in this encounter.  Orders:  No orders of the defined types were placed in this encounter.  Follow-up plan:   No follow-ups on file.      Interventional Therapies  Risk  Complexity Considerations:   Allergy: CONTRAST  COPD  BA  CKD  SOBOE  GERD  Jehovah's witness  Hx. intestinal obstruction  CHF   HTN  DM  MO (BMI>30)  No RFA until patient brings BMI down to less than 30 kg/m   Planned  Pending:      Under consideration:   Possible bilateral lumbar facet RFA #1 (on hold until BMI is brought down to 30 kg/m)   Completed:   Diagnostic left IA shoulder and AC joint inj. x1 (10/08/2023) (100/100/60/60)  Diagnostic bilateral lumbar facet MBB x6 (07/14/2024) (100/100/90 x 1 week/75)    Therapeutic  Palliative (PRN) options:   Therapeutic/palliative bilateral lumbar facet MBB   Pharmacotherapy  Nonopioids transferred 11/09/2020: Magnesium , Lyrica , and Mobic .       Recent Visits Date Type Provider Dept  07/14/24 Procedure visit Tanya Glisson, MD [facility]  06/25/24 Office Visit Patel, Seema K, NP [facility]  Showing recent visits within past 90 days and meeting all other requirements Today's Visits Date Type Provider Dept  07/29/24 Office Visit Tanya Glisson, MD [facility]  Showing today's visits and meeting all other requirements Future Appointments Date Type Provider Dept  09/24/24 Appointment Patel, Seema K, NP [facility]  Showing future appointments within next 90 days and meeting all other requirements  I discussed the assessment and treatment plan with the patient. The patient was provided an opportunity to ask questions and all were answered. The patient agreed with the plan and demonstrated an understanding of the instructions.  Patient advised to call back or seek an in-person evaluation if the symptoms or condition worsens.  Duration of encounter: 10 minutes.  Note by: Glisson DELENA Tanya, MD Date: 07/29/2024; Time: 12:51 PM

## 2024-07-29 ENCOUNTER — Ambulatory Visit: Attending: Pain Medicine | Admitting: Pain Medicine

## 2024-07-29 DIAGNOSIS — M545 Low back pain, unspecified: Secondary | ICD-10-CM

## 2024-07-29 DIAGNOSIS — M47816 Spondylosis without myelopathy or radiculopathy, lumbar region: Secondary | ICD-10-CM

## 2024-07-29 DIAGNOSIS — G8929 Other chronic pain: Secondary | ICD-10-CM | POA: Diagnosis not present

## 2024-07-29 DIAGNOSIS — Z09 Encounter for follow-up examination after completed treatment for conditions other than malignant neoplasm: Secondary | ICD-10-CM | POA: Diagnosis not present

## 2024-07-29 DIAGNOSIS — M5459 Other low back pain: Secondary | ICD-10-CM

## 2024-08-13 ENCOUNTER — Encounter: Payer: Self-pay | Admitting: *Deleted

## 2024-08-31 ENCOUNTER — Other Ambulatory Visit: Payer: Self-pay | Admitting: Nurse Practitioner

## 2024-09-24 ENCOUNTER — Encounter: Payer: Self-pay | Admitting: Nurse Practitioner

## 2024-09-24 ENCOUNTER — Ambulatory Visit: Attending: Nurse Practitioner | Admitting: Nurse Practitioner

## 2024-09-24 DIAGNOSIS — Z79891 Long term (current) use of opiate analgesic: Secondary | ICD-10-CM | POA: Insufficient documentation

## 2024-09-24 DIAGNOSIS — Z79899 Other long term (current) drug therapy: Secondary | ICD-10-CM | POA: Insufficient documentation

## 2024-09-24 DIAGNOSIS — M0579 Rheumatoid arthritis with rheumatoid factor of multiple sites without organ or systems involvement: Secondary | ICD-10-CM | POA: Diagnosis present

## 2024-09-24 DIAGNOSIS — M47816 Spondylosis without myelopathy or radiculopathy, lumbar region: Secondary | ICD-10-CM | POA: Diagnosis present

## 2024-09-24 DIAGNOSIS — M25551 Pain in right hip: Secondary | ICD-10-CM | POA: Diagnosis present

## 2024-09-24 DIAGNOSIS — M7918 Myalgia, other site: Secondary | ICD-10-CM | POA: Diagnosis present

## 2024-09-24 DIAGNOSIS — M542 Cervicalgia: Secondary | ICD-10-CM | POA: Diagnosis present

## 2024-09-24 DIAGNOSIS — M48062 Spinal stenosis, lumbar region with neurogenic claudication: Secondary | ICD-10-CM | POA: Diagnosis present

## 2024-09-24 DIAGNOSIS — M47812 Spondylosis without myelopathy or radiculopathy, cervical region: Secondary | ICD-10-CM | POA: Insufficient documentation

## 2024-09-24 DIAGNOSIS — G894 Chronic pain syndrome: Secondary | ICD-10-CM | POA: Insufficient documentation

## 2024-09-24 DIAGNOSIS — M5136 Other intervertebral disc degeneration, lumbar region with discogenic back pain only: Secondary | ICD-10-CM | POA: Diagnosis present

## 2024-09-24 DIAGNOSIS — G8929 Other chronic pain: Secondary | ICD-10-CM | POA: Diagnosis present

## 2024-09-24 DIAGNOSIS — M545 Low back pain, unspecified: Secondary | ICD-10-CM | POA: Diagnosis present

## 2024-09-24 MED ORDER — HYDROCODONE-ACETAMINOPHEN 10-325 MG PO TABS: 1.0000 | ORAL_TABLET | Freq: Three times a day (TID) | ORAL | 0 refills | Status: DC

## 2024-09-24 NOTE — Progress Notes (Signed)
 Nursing Pain Medication Assessment:  Safety precautions to be maintained throughout the outpatient stay will include: orient to surroundings, keep bed in low position, maintain call bell within reach at all times, provide assistance with transfer out of bed and ambulation.   Medication Inspection Compliance: Pill count conducted under aseptic conditions, in front of the patient. Neither the pills nor the bottle was removed from the patient's sight at any time. Once count was completed pills were immediately returned to the patient in their original bottle.  Medication: Hydrocodone /APAP Pill/Patch Count: 11 of 90 pills/patches remain Pill/Patch Appearance: Markings consistent with prescribed medication Bottle Appearance: Standard pharmacy container. Clearly labeled. Filled Date: 42 / 18 / 2025 Last Medication intake:  Today

## 2024-09-24 NOTE — Progress Notes (Signed)
 PROVIDER NOTE: Interpretation of information contained herein should be left to medically-trained personnel. Specific patient instructions are provided elsewhere under Patient Instructions section of medical record. This document was created in part using AI and STT-dictation technology, any transcriptional errors that may result from this process are unintentional.  Patient: Mariah Poole  Service: E/M   PCP: Buren Rock HERO, MD  DOB: August 13, 1946  DOS: 09/24/2024  Provider: Emmy MARLA Blanch, NP  MRN: [id number]  Delivery: Face-to-face  Specialty: Interventional Pain Management  Type: Established Patient  Setting: Ambulatory outpatient facility  Specialty designation: 09  Referring Prov.: Buren Rock HERO, MD  Location: Outpatient office facility       History of present illness (HPI) Mariah Poole, a 78 y.o. year old female, is here today because of her back pain radiate to left buttocks down to left leg to left knee. 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  Pain Assessment: Severity of Chronic pain is reported as a 5 /10. Location: Back Lower, Left/Lower left into left buttocks down leg left to left knee. Onset: More than a month ago. Quality: Constant, Aching. Timing: Constant. Modifying factor(s): Pain medication and streching excercises. Vitals:  height is 5' (1.524 m) and weight is 176 lb (79.8 kg). Her  temporal temperature is 97.1 F (36.2 C) (abnormal). Her blood pressure is 140/86 (abnormal) and her pulse is 88. Her respiration is 16 and oxygen saturation is 99%.  BMI: Estimated body mass index is 34.37 kg/m as calculated from the following:   Height as of this encounter: 5' (1.524 m).   Weight as of this encounter: 176 lb (79.8 kg).  Last encounter: 06/25/2024. Last procedure: Visit date not found.  Reason for encounter: medication management. The patient doing well with current medication regimen. No adverse reaction or side effects reported to the medication. The patient has a history of rheumatoid arthritis and reported improvement in her symptoms following the procedure. However, she experiences recurrent low back and bilateral hand pain, particularly with changes in the weather.   Discussed the use of AI scribe software for clinical note transcription with the patient, who gave verbal consent to proceed.  History of Present Illness   Mariah Poole is a 78 year old female who presents with low back pain for follow-up.  She has been experiencing low back pain that was initially managed with a facet block in August. The pain returned with increased severity in September, significantly impacting her daily activities such as changing sheets and vacuuming. She can stand for some time before the pain returns. The pain is localized to her lower back, where she previously received a facet block, and she notes that the pain relief from the procedure was temporary.  She is currently taking Norco 10-325 mg for pain management. She experiences constipation as a side effect of this medication and manages it with a laxative and stool softener.     Pharmacotherapy Assessment   Hydrocodone -acetaminophen  (Norco) 10-325 mg tablet every 8 hours as needed  for pain. MME=30 Monitoring: [place] PMP: PDMP reviewed during this encounter.       Pharmacotherapy: No side-effects or adverse reactions  reported. Compliance: No problems identified. Effectiveness: Clinically acceptable.  Bonner Norris, RN  09/24/2024  2:43 PM  Sign when Signing Visit Nursing Pain Medication Assessment:  Safety precautions to be maintained throughout the outpatient stay will include: orient to surroundings, keep bed in low position, maintain call bell within reach at all times, provide assistance with transfer out of bed and ambulation.   Medication Inspection Compliance: Pill count conducted under aseptic conditions, in front of the patient. Neither the pills nor the bottle was removed from the patient's sight at any time. Once count was completed pills were immediately returned to the patient in their original bottle.  Medication: Hydrocodone /APAP Pill/Patch Count: 11 of 90 pills/patches remain Pill/Patch Appearance: Markings consistent with prescribed medication Bottle Appearance: Standard pharmacy container. Clearly labeled. Filled Date: 72 / 11 / 2025 Last Medication intake:  Today          UDS:  Summary  Date Value Ref Range Status  06/25/2024 FINAL  Final    Comment:    ==================================================================== [employer] [facility] (MW) ==================================================================== Test                             Result       Flag       Units  Drug Present and Declared for Prescription Verification   Hydrocodone                     1912         EXPECTED   ng/mg creat   Hydromorphone                  146          EXPECTED   ng/mg creat   Dihydrocodeine                 436          EXPECTED   ng/mg creat   Norhydrocodone                 2613         EXPECTED   ng/mg creat    Sources of hydrocodone  include scheduled prescription medications.    Hydromorphone, dihydrocodeine and norhydrocodone are expected    metabolites of hydrocodone . Hydromorphone and dihydrocodeine are    also available as scheduled prescription  medications.  ==================================================================== Test                      Result    Flag   Units      Ref Range   Creatinine              142              mg/dL      >=79 ==================================================================== Declared Medications:  The flagging and interpretation on this report are based on the  following declared medications.  Unexpected results may arise from  inaccuracies in the declared medications.   **Note: The testing scope of this panel includes these medications:   Hydrocodone  (Norco)   **Note: The testing scope of this panel does not include the  following reported medications:   Acetaminophen  (Norco)  Albuterol (Ventolin HFA)  Atenolol (Tenormin)  Baclofen (Lioresal)  Budesonide (Symbicort )  Duloxetine (Cymbalta)  Famotidine  (Pepcid )  Fluticasone  (Breo)  Formoterol (Symbicort )  Furosemide (Lasix)  Iron  Lidocaine   Losartan (Cozaar)  Magnesium  (Mag-Ox)  Meloxicam  (Mobic )  Montelukast (Singulair)  Naloxone  (Narcan )  Pantoprazole  (Protonix )  Potassium (Klor-Con)  Pravastatin (Pravachol)  Semaglutide (Ozempic)  Spironolactone (Aldactone)  Turmeric  Vilanterol (Breo)  Vitamin C  Vitamin D3 ==================================================================== For clinical consultation, please call [phone]. ====================================================================     No results found for: CBDTHCR No results found for: D8THCCBX No results found for: D9THCCBX  ROS  Constitutional: Denies any fever or chills Gastrointestinal: No reported hemesis, hematochezia, vomiting, or acute GI distress Musculoskeletal: low back pain radiate to left lower leg and left knee Neurological: No reported episodes of acute onset apraxia, aphasia, dysarthria, agnosia, amnesia, paralysis, loss of coordination, or loss of consciousness  Medication Review  DULoxetine,  HYDROcodone -acetaminophen , Lidocaine , Semaglutide(0.25 or 0.5MG /DOS), Turmeric Curcumin, Vitamin D3, albuterol, ascorbic acid, atenolol, baclofen, budesonide-formoterol, famotidine , ferrous sulfate, fluticasone  furoate-vilanterol, furosemide, losartan, magnesium  oxide, meloxicam , montelukast, pantoprazole , potassium chloride SA, pravastatin, and spironolactone  History Review  Allergy: Mariah Poole is allergic to contrast media [iodinated contrast media], ioxaglate, and shellfish allergy. Drug: Mariah Poole  reports current drug use. Alcohol:  reports no history of alcohol use. Tobacco:  reports that she has never smoked. She has never used smokeless tobacco. Social: Mariah Poole  reports that she has never smoked. She has never used smokeless tobacco. She reports current drug use. She reports that she does not drink alcohol. Medical:  has a past medical history of Anemia, Arthritis, COPD (chronic obstructive pulmonary disease) (HCC), Depression, Diabetes mellitus without complication (HCC), GERD (gastroesophageal reflux disease), Hiatal hernia, History of contrast media allergy. (IVP dye) (09/01/2015), History of hiatal hernia, Hypercholesteremia, Hypertension, Morbid obesity (HCC), Scoliosis, and Shortness of breath dyspnea. Surgical: Mariah Poole  has a past surgical history that includes Abdominal hysterectomy; Cataract extraction w/ intraocular lens implant (Right); Gastric bypass; Hernia repair; Cataract extraction w/PHACO (Left, 07/19/2015); Cholecystectomy; and Hernia repair. Family: family history includes Diabetes in her mother; Heart disease in her father.  Laboratory Chemistry Profile   Renal Lab Results  Component Value Date   BUN 14 09/14/2021   CREATININE 1.02 (H) 09/14/2021   BCR 21 06/17/2019   GFRAA 53 (L) 06/17/2019   GFRNONAA 57 (L) 09/14/2021    Hepatic Lab Results  Component Value Date   AST 19 09/14/2021   ALT 11 09/14/2021   ALBUMIN  3.8 09/14/2021   ALKPHOS 66  09/14/2021   LIPASE 19 10/27/2015    Electrolytes Lab Results  Component Value Date   NA 140 09/14/2021   K 4.3 09/14/2021   CL 102 09/14/2021   CALCIUM 9.3 09/14/2021   MG 1.9 06/17/2019    Bone Lab Results  Component Value Date   25OHVITD1 45 06/17/2019   25OHVITD2 <1.0 06/17/2019   25OHVITD3 45 06/17/2019    Inflammation (CRP: Acute Phase) (ESR: Chronic Phase) Lab Results  Component Value Date   CRP 7 06/17/2019   ESRSEDRATE 49 (H) 06/17/2019   LATICACIDVEN 4.0 (H) 11/23/2009         Note: Above Lab results reviewed.  Recent Imaging Review  DG PAIN CLINIC C-ARM 1-60 MIN NO REPORT Fluoro was used, but no Radiologist interpretation will be provided.  Please refer to NOTES tab for provider progress note. Note: Reviewed        Physical Exam  Vitals: BP (!) 140/86 (Patient Position: Sitting, Cuff Size: Normal)   Pulse 88   Temp (!) 97.1 F (36.2 C) (Temporal)   Resp 16  Ht 5' (1.524 m)   Wt 176 lb (79.8 kg)   SpO2 99%   BMI 34.37 kg/m  BMI: Estimated body mass index is 34.37 kg/m as calculated from the following:   Height as of this encounter: 5' (1.524 m).   Weight as of this encounter: 176 lb (79.8 kg). Ideal: Ideal body weight: 45.5 kg (100 lb 4.9 oz) Adjusted ideal body weight: 59.2 kg (130 lb 9.4 oz) General appearance: Well nourished, well developed, and well hydrated. In no apparent acute distress Mental status: Alert, oriented x 3 (person, place, & time)       Respiratory: No evidence of acute respiratory distress Eyes: PERLA  Musculoskeletal: +LBP Assessment   Diagnosis Status  1. Chronic bilateral low back pain without sciatica   2. Chronic pain syndrome   3. Chronic use of opiate for therapeutic purpose   4. Cervicalgia   5. Degeneration of intervertebral disc of lumbar region with discogenic back pain   6. Spinal stenosis, lumbar region, with neurogenic claudication   7. Cervical facet joint syndrome   8. Chronic right hip pain   9.  Lumbar facet joint syndrome   10. Rheumatoid arthritis involving multiple sites with positive rheumatoid factor (HCC)   11. Chronic musculoskeletal pain   12. Pharmacologic therapy    Controlled Controlled Controlled   Updated Problems: No problems updated.  Plan of Care  Problem-specific:  Assessment and Plan    Chronic low back pain Pain returned after previous facet block relief. The patient continues experiencing low back pain, however, currently she manage with hydrocodone .  - Continue Norco 10-325 mg.  Constipation secondary to opioid therapy Constipation due to Norco 10325 mg. - Continue laxative and stool softener regimen.   Chronic pain syndrome: Patient's pain is controlled with hydrocodone , will continue on current medication regimen. Prescribing drug monitoring (PDMP) consistent with prescribed medication. Urine drug screening (UDS) up-to-date. She has been managing her muscle spasm with magnesium  supplementation and has also been advised to consume electrolyte-rich drinks, including Gatorade. Schedule follow up in 90 days for medication management.       Mariah Poole has a current medication list which includes the following long-term medication(s): albuterol, atenolol, duloxetine, famotidine , ferosul, furosemide, montelukast, pantoprazole , potassium chloride sa, pravastatin, spironolactone, symbicort , [START ON 10/01/2024] hydrocodone -acetaminophen , [START ON 10/31/2024] hydrocodone -acetaminophen , and [START ON 11/30/2024] hydrocodone -acetaminophen .  Pharmacotherapy (Medications Ordered): Meds ordered this encounter  Medications   HYDROcodone -acetaminophen  (NORCO) 10-325 MG tablet    Sig: Take 1 tablet by mouth every 8 (eight) hours. Must last 30 days    Dispense:  90 tablet    Refill:  0    DO NOT: delete (not duplicate); no partial-fill (will deny script to complete), no refill request (F/U required). DISPENSE: 1 day early if closed on fill date. WARN: No  CNS-depressants within 8 hrs of med.   HYDROcodone -acetaminophen  (NORCO) 10-325 MG tablet    Sig: Take 1 tablet by mouth every 8 (eight) hours. Must last 30 days    Dispense:  90 tablet    Refill:  0    DO NOT: delete (not duplicate); no partial-fill (will deny script to complete), no refill request (F/U required). DISPENSE: 1 day early if closed on fill date. WARN: No CNS-depressants within 8 hrs of med.   HYDROcodone -acetaminophen  (NORCO) 10-325 MG tablet    Sig: Take 1 tablet by mouth every 8 (eight) hours. Must last 30 days    Dispense:  90 tablet    Refill:  0  DO NOT: delete (not duplicate); no partial-fill (will deny script to complete), no refill request (F/U required). DISPENSE: 1 day early if closed on fill date. WARN: No CNS-depressants within 8 hrs of med.   Orders:  No orders of the defined types were placed in this encounter.       Return in about 3 months (around 12/25/2024) for (F2F), (MM), Emmy Blanch NP.    Recent Visits Date Type Provider Dept  07/29/24 Office Visit Tanya Glisson, MD [facility]  07/14/24 Procedure visit Tanya Glisson, MD [facility]  Showing recent visits within past 90 days and meeting all other requirements Today's Visits Date Type Provider Dept  09/24/24 Office Visit Triana Coover K, NP [facility]  Showing today's visits and meeting all other requirements Future Appointments No visits were found meeting these conditions. Showing future appointments within next 90 days and meeting all other requirements  I discussed the assessment and treatment plan with the patient. The patient was provided an opportunity to ask questions and all were answered. The patient agreed with the plan and demonstrated an understanding of the instructions.  Patient advised to call back or seek an in-person evaluation if the symptoms or condition worsens.  I personally spent a total of 30 minutes in the care of the patient  today including preparing to see the patient, getting/reviewing separately obtained history, performing a medically appropriate exam/evaluation, counseling and educating, placing orders, referring and communicating with other health care professionals, documenting clinical information in the EHR, independently interpreting results, communicating results, and coordinating care.   Note by: Josette Shimabukuro K Yizel Canby, NP (TTS and AI technology used. I apologize for any typographical errors that were not detected and corrected.) Date: 09/24/2024; Time: 2:54 PM

## 2024-12-21 ENCOUNTER — Telehealth: Payer: Self-pay | Admitting: Pain Medicine

## 2024-12-21 NOTE — Telephone Encounter (Signed)
 Pt wanted to know could she do VV appt tomorrow

## 2024-12-21 NOTE — Progress Notes (Unsigned)
 PROVIDER NOTE: Interpretation of information contained herein should be left to medically-trained personnel. Specific patient instructions are provided elsewhere under Patient Instructions section of medical record. This document was created in part using AI and STT-dictation technology, any transcriptional errors that may result from this process are unintentional.  Patient: Mariah Poole  Service: E/M   PCP: Buren Rock HERO, MD  DOB: 1946/08/07  DOS: 12/22/2024  Provider: Emmy MARLA Blanch, NP  MRN: [id number]  Delivery: Virtual Visit  Specialty: Interventional Pain Management  Type: Established Patient  Setting: Ambulatory outpatient facility  Specialty designation: 09  Referring Prov.: Buren Rock HERO, MD  Location: Remote location       Virtual Encounter - Pain Management PROVIDER NOTE: Information contained herein reflects review and annotations entered in association with encounter. Interpretation of such information and data should be left to medically-trained personnel. Information provided to patient can be located elsewhere in the medical record under Patient Instructions. Document created using STT-dictation technology, any transcriptional errors that may result from process are unintentional.    Contact & Pharmacy Preferred: [phone] Home: [phone] (home) Mobile: [phone] (mobile) E-mail: [email or web address]  [facility] - [place], [place] - [address] [place] [address] Phone: [phone] Fax: [phone]   Pre-screening  Mariah Poole offered in-person vs virtual encounter. She indicated preferring virtual for this encounter.   Reason COVID-19*  Social distancing based on CDC and AMA recommendations.   I contacted Ree Alcalde Nissen on 12/22/2024 via telephone.      I clearly identified myself as Emmy MARLA Blanch, NP. I verified that I was speaking with the correct person using two identifiers (Name: Mariah Poole, and date of birth:  1984-09-29).  Consent I sought verbal advanced consent from Mariah ORN Cory for virtual visit interactions. I informed [name] of possible security and privacy concerns, risks, and limitations associated with providing not-in-person medical evaluation and management services. I also informed [name] of the availability of in-person appointments. Finally, I informed her that there would be a charge for the virtual visit and that she could be  personally, fully or partially, financially responsible for it. Ms. Gutzwiller expressed understanding and agreed to proceed.   Historic Elements   [name] is a 79 y.o. year old, female patient evaluated today after our last contact on 09/24/2024. Mariah Poole  has a past medical history of Anemia, Arthritis, COPD (chronic obstructive pulmonary disease) (HCC), Depression, Diabetes mellitus without complication (HCC), GERD (gastroesophageal reflux disease), Hiatal hernia, History of contrast media allergy. (IVP dye) (09/01/2015), History of hiatal hernia, Hypercholesteremia, Hypertension, Morbid obesity (HCC), Scoliosis, and Shortness of breath dyspnea. She also  has a past surgical history that includes Abdominal hysterectomy; Cataract extraction w/ intraocular lens implant (Right); Gastric bypass; Hernia repair; Cataract extraction w/PHACO (Left, 07/19/2015); Cholecystectomy; and Hernia repair. Mariah Poole has a current medication list which includes the following prescription(s): albuterol, atenolol, baclofen, vitamin d3, duloxetine, famotidine , ferosul, fluticasone  furoate-vilanterol, furosemide, lidocaine , [START ON 12/30/2024] hydrocodone -acetaminophen , losartan, magnesium  oxide, meloxicam , montelukast, ozempic (0.25 or 0.5 mg/dose), pantoprazole , potassium chloride sa, pravastatin, spironolactone, symbicort , turmeric curcumin, and ascorbic acid. She  reports that she has never smoked. She has never used smokeless tobacco. She reports current drug use.  She reports that she does not drink alcohol. Mariah Poole is allergic to contrast media [iodinated contrast media], ioxaglate, and shellfish allergy.  BMI: Estimated body mass index is 34.37 kg/m as calculated from the following:   Height as of  09/24/24: 5' (1.524 m).   Weight as of 09/24/24: 176 lb (79.8 kg). Last encounter: 09/24/2024. Last procedure: Visit date not found.  HPI  Today, she is being contacted for medication management. The patient indicates doing well with current medication regimen. No side effects or adverse reaction reported to medication. She continues suffering with low back pain that significantly impacting her daily activities such as changing sheets and vacuuming. She can stand for some time before the pain returns. The pain is localized to her lower back, where she previously received a facet block, and she notes that the pain relief from the procedure was temporary. She reports pain level 5/10. She was admitted to [facility] hospital due to acute pancreatitis.  Pharmacotherapy Assessment  Hydrocodone -acetaminophen  (Norco) 10-325 mg tablet every 8 hours as needed for pain. MME=30 Monitoring: [place] PMP: PDMP reviewed during this encounter.       Pharmacotherapy: No side-effects or adverse reactions reported. Compliance: No problems identified. Effectiveness: Clinically acceptable. Plan: Refer to POC.  UDS:  Summary  Date Value Ref Range Status  06/25/2024 FINAL  Final    Comment:    ==================================================================== [employer] Select 13 (MW) ==================================================================== Test                             Result       Flag       Units  Drug Present and Declared for Prescription Verification   Hydrocodone                     1912         EXPECTED   ng/mg creat   Hydromorphone                  146          EXPECTED   ng/mg creat   Dihydrocodeine                 436          EXPECTED   ng/mg creat    Norhydrocodone                 2613         EXPECTED   ng/mg creat    Sources of hydrocodone  include scheduled prescription medications.    Hydromorphone, dihydrocodeine and norhydrocodone are expected    metabolites of hydrocodone . Hydromorphone and dihydrocodeine are    also available as scheduled prescription medications.  ==================================================================== Test                      Result    Flag   Units      Ref Range   Creatinine              142              mg/dL      >=79 ==================================================================== Declared Medications:  The flagging and interpretation on this report are based on the  following declared medications.  Unexpected results may arise from  inaccuracies in the declared medications.   **Note: The testing scope of this panel includes these medications:   Hydrocodone  (Norco)   **Note: The testing scope of this panel does not include the  following reported medications:   Acetaminophen  (Norco)  Albuterol (Ventolin HFA)  Atenolol (Tenormin)  Baclofen (Lioresal)  Budesonide (Symbicort )  Duloxetine (Cymbalta)  Famotidine  (Pepcid )  Fluticasone  (Breo)  Formoterol (Symbicort )  Furosemide (  Lasix)  Iron  Lidocaine   Losartan (Cozaar)  Magnesium  (Mag-Ox)  Meloxicam  (Mobic )  Montelukast (Singulair)  Naloxone  (Narcan )  Pantoprazole  (Protonix )  Potassium (Klor-Con)  Pravastatin (Pravachol)  Semaglutide (Ozempic)  Spironolactone (Aldactone)  Turmeric  Vilanterol (Breo)  Vitamin C  Vitamin D3 ==================================================================== For clinical consultation, please call [phone]. ====================================================================    No results found for: CBDTHCR, D8THCCBX, D9THCCBX  Laboratory Chemistry Profile   Renal Lab Results  Component Value Date   BUN 14 09/14/2021   CREATININE 1.02 (H) 09/14/2021   BCR 21  06/17/2019   GFRAA 53 (L) 06/17/2019   GFRNONAA 57 (L) 09/14/2021    Hepatic Lab Results  Component Value Date   AST 19 09/14/2021   ALT 11 09/14/2021   ALBUMIN  3.8 09/14/2021   ALKPHOS 66 09/14/2021   LIPASE 19 10/27/2015    Electrolytes Lab Results  Component Value Date   NA 140 09/14/2021   K 4.3 09/14/2021   CL 102 09/14/2021   CALCIUM 9.3 09/14/2021   MG 1.9 06/17/2019    Bone Lab Results  Component Value Date   25OHVITD1 45 06/17/2019   25OHVITD2 <1.0 06/17/2019   25OHVITD3 45 06/17/2019    Inflammation (CRP: Acute Phase) (ESR: Chronic Phase) Lab Results  Component Value Date   CRP 7 06/17/2019   ESRSEDRATE 49 (H) 06/17/2019   LATICACIDVEN 4.0 (H) 11/23/2009         Note: Above Lab results reviewed.  Imaging  DG PAIN CLINIC C-ARM 1-60 MIN NO REPORT Fluoro was used, but no Radiologist interpretation will be provided.  Please refer to NOTES tab for provider progress note.  Assessment  Diagnoses of Chronic bilateral low back pain without sciatica, Cervicalgia, Degeneration of intervertebral disc of lumbar region with discogenic back pain, Spinal stenosis, lumbar region, with neurogenic claudication, Cervical facet joint syndrome, Chronic right hip pain, Lumbar facet joint syndrome, Rheumatoid arthritis involving multiple sites with positive rheumatoid factor (HCC), Chronic musculoskeletal pain, Chronic pain syndrome, Pharmacologic therapy, and Chronic use of opiate for therapeutic purpose were pertinent to this visit.  Plan of Care  Problem-specific:  Medication management: Patient's pain is controlled with hydrocodone , will continue on current medication regimen. Prescribing drug monitoring (PDMP) consistent with prescribed medication. Urine drug screening (UDS) up-to-date. She has been managing her muscle spasm with magnesium  supplementation and has also been advised to consume electrolyte-rich drinks, including Gatorade. She reports opioid-induced  constipation, which she managed with MiraLAX. Schedule follow up in 30 days for medication management.   Ms. MARKETIA STALLSMITH has a current medication list which includes the following long-term medication(s): albuterol, atenolol, duloxetine, famotidine , ferosul, furosemide, [START ON 12/30/2024] hydrocodone -acetaminophen , montelukast, pantoprazole , potassium chloride sa, pravastatin, spironolactone, and symbicort .  Pharmacotherapy (Medications Ordered): Meds ordered this encounter  Medications   HYDROcodone -acetaminophen  (NORCO) 10-325 MG tablet    Sig: Take 1 tablet by mouth every 8 (eight) hours. Must last 30 days    Dispense:  90 tablet    Refill:  0    DO NOT: delete (not duplicate); no partial-fill (will deny script to complete), no refill request (F/U required). DISPENSE: 1 day early if closed on fill date. WARN: No CNS-depressants within 8 hrs of med.   Orders:  No orders of the defined types were placed in this encounter.  Follow-up plan:   Return in about 1 month (around 01/19/2025) for (F2F), (MM), Emmy Blanch NP.         Recent Visits Date Type Provider Dept  09/24/24 Office Visit Sigismund Cross  K, NP [facility]  Showing recent visits within past 90 days and meeting all other requirements Today's Visits Date Type Provider Dept  12/22/24 Office Visit Quinlee Sciarra K, NP [facility]  Showing today's visits and meeting all other requirements Future Appointments No visits were found meeting these conditions. Showing future appointments within next 90 days and meeting all other requirements  I discussed the assessment and treatment plan with the patient. The patient was provided an opportunity to ask questions and all were answered. The patient agreed with the plan and demonstrated an understanding of the instructions.  Patient advised to call back or seek an in-person evaluation if the symptoms or condition worsens.  Duration of encounter: 30  minutes.  Note by: Emmy MARLA Blanch, NP Date: 12/22/2024; Time: 1:08 PM

## 2024-12-22 ENCOUNTER — Ambulatory Visit: Admitting: Nurse Practitioner

## 2024-12-22 DIAGNOSIS — G8929 Other chronic pain: Secondary | ICD-10-CM

## 2024-12-22 DIAGNOSIS — Z79891 Long term (current) use of opiate analgesic: Secondary | ICD-10-CM

## 2024-12-22 DIAGNOSIS — Z79899 Other long term (current) drug therapy: Secondary | ICD-10-CM

## 2024-12-22 DIAGNOSIS — M47812 Spondylosis without myelopathy or radiculopathy, cervical region: Secondary | ICD-10-CM

## 2024-12-22 DIAGNOSIS — M48062 Spinal stenosis, lumbar region with neurogenic claudication: Secondary | ICD-10-CM

## 2024-12-22 DIAGNOSIS — G894 Chronic pain syndrome: Secondary | ICD-10-CM

## 2024-12-22 DIAGNOSIS — M5136 Other intervertebral disc degeneration, lumbar region with discogenic back pain only: Secondary | ICD-10-CM

## 2024-12-22 DIAGNOSIS — M47816 Spondylosis without myelopathy or radiculopathy, lumbar region: Secondary | ICD-10-CM

## 2024-12-22 DIAGNOSIS — M0579 Rheumatoid arthritis with rheumatoid factor of multiple sites without organ or systems involvement: Secondary | ICD-10-CM

## 2024-12-22 DIAGNOSIS — M542 Cervicalgia: Secondary | ICD-10-CM

## 2024-12-22 MED ORDER — HYDROCODONE-ACETAMINOPHEN 10-325 MG PO TABS: 1.0000 | ORAL_TABLET | Freq: Three times a day (TID) | ORAL | 0 refills | Status: AC

## 2025-01-19 ENCOUNTER — Encounter: Admitting: Nurse Practitioner
